# Patient Record
Sex: Female | Born: 1935 | Race: White | Hispanic: No | Marital: Single | Smoking: Never smoker
Health system: Southern US, Community
[De-identification: ages and names within clinical notes are randomized; demographics above are authoritative.]

## PROBLEM LIST (undated history)

## (undated) DIAGNOSIS — L02612 Cutaneous abscess of left foot: Secondary | ICD-10-CM

## (undated) DIAGNOSIS — Z972 Presence of dental prosthetic device (complete) (partial): Secondary | ICD-10-CM

## (undated) DIAGNOSIS — I4891 Unspecified atrial fibrillation: Secondary | ICD-10-CM

## (undated) DIAGNOSIS — I712 Thoracic aortic aneurysm, without rupture, unspecified: Secondary | ICD-10-CM

## (undated) DIAGNOSIS — H269 Unspecified cataract: Secondary | ICD-10-CM

## (undated) DIAGNOSIS — T7840XA Allergy, unspecified, initial encounter: Secondary | ICD-10-CM

## (undated) DIAGNOSIS — R011 Cardiac murmur, unspecified: Secondary | ICD-10-CM

## (undated) DIAGNOSIS — F419 Anxiety disorder, unspecified: Secondary | ICD-10-CM

## (undated) DIAGNOSIS — K219 Gastro-esophageal reflux disease without esophagitis: Secondary | ICD-10-CM

## (undated) DIAGNOSIS — L03032 Cellulitis of left toe: Secondary | ICD-10-CM

## (undated) DIAGNOSIS — M1991 Primary osteoarthritis, unspecified site: Secondary | ICD-10-CM

## (undated) DIAGNOSIS — C801 Malignant (primary) neoplasm, unspecified: Secondary | ICD-10-CM

## (undated) DIAGNOSIS — Z923 Personal history of irradiation: Secondary | ICD-10-CM

## (undated) HISTORY — PX: OTHER SURGICAL HISTORY: SHX169

## (undated) HISTORY — DX: Allergy, unspecified, initial encounter: T78.40XA

## (undated) HISTORY — DX: Anxiety disorder, unspecified: F41.9

## (undated) HISTORY — DX: Cardiac murmur, unspecified: R01.1

## (undated) HISTORY — DX: Gastro-esophageal reflux disease without esophagitis: K21.9

## (undated) HISTORY — PX: JOINT REPLACEMENT: SHX530

## (undated) HISTORY — PX: SKIN BIOPSY: SHX1

## (undated) HISTORY — DX: Primary osteoarthritis, unspecified site: M19.91

## (undated) HISTORY — PX: REPLACEMENT TOTAL KNEE: SUR1224

## (undated) HISTORY — DX: Unspecified cataract: H26.9

---
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
## 1989-06-15 DIAGNOSIS — Z923 Personal history of irradiation: Secondary | ICD-10-CM

## 1989-06-15 DIAGNOSIS — C801 Malignant (primary) neoplasm, unspecified: Secondary | ICD-10-CM

## 1989-06-15 HISTORY — PX: BREAST LUMPECTOMY: SHX2

## 1989-06-15 HISTORY — DX: Personal history of irradiation: Z92.3

## 1989-06-15 HISTORY — DX: Malignant (primary) neoplasm, unspecified: C80.1

## 1993-06-15 HISTORY — PX: OOPHORECTOMY: SHX86

## 2008-06-15 HISTORY — PX: CYSTOCELE REPAIR: SHX163

## 2011-12-18 DIAGNOSIS — M1990 Unspecified osteoarthritis, unspecified site: Secondary | ICD-10-CM | POA: Insufficient documentation

## 2011-12-18 DIAGNOSIS — Z853 Personal history of malignant neoplasm of breast: Secondary | ICD-10-CM | POA: Insufficient documentation

## 2012-12-13 DIAGNOSIS — M19019 Primary osteoarthritis, unspecified shoulder: Secondary | ICD-10-CM | POA: Insufficient documentation

## 2014-06-15 HISTORY — PX: TOTAL SHOULDER ARTHROPLASTY: SHX126

## 2014-07-04 DIAGNOSIS — M810 Age-related osteoporosis without current pathological fracture: Secondary | ICD-10-CM | POA: Insufficient documentation

## 2015-06-16 HISTORY — PX: EYE SURGERY: SHX253

## 2015-06-19 DIAGNOSIS — M4806 Spinal stenosis, lumbar region: Secondary | ICD-10-CM | POA: Diagnosis not present

## 2015-06-28 DIAGNOSIS — M4806 Spinal stenosis, lumbar region: Secondary | ICD-10-CM | POA: Diagnosis not present

## 2015-07-03 DIAGNOSIS — M4806 Spinal stenosis, lumbar region: Secondary | ICD-10-CM | POA: Diagnosis not present

## 2015-07-05 DIAGNOSIS — M4806 Spinal stenosis, lumbar region: Secondary | ICD-10-CM | POA: Diagnosis not present

## 2015-07-09 DIAGNOSIS — H2512 Age-related nuclear cataract, left eye: Secondary | ICD-10-CM | POA: Diagnosis not present

## 2015-07-10 DIAGNOSIS — M4806 Spinal stenosis, lumbar region: Secondary | ICD-10-CM | POA: Diagnosis not present

## 2015-07-23 DIAGNOSIS — M4806 Spinal stenosis, lumbar region: Secondary | ICD-10-CM | POA: Diagnosis not present

## 2015-08-09 DIAGNOSIS — H2512 Age-related nuclear cataract, left eye: Secondary | ICD-10-CM | POA: Insufficient documentation

## 2015-08-12 DIAGNOSIS — F419 Anxiety disorder, unspecified: Secondary | ICD-10-CM | POA: Diagnosis not present

## 2015-08-12 DIAGNOSIS — Z791 Long term (current) use of non-steroidal anti-inflammatories (NSAID): Secondary | ICD-10-CM | POA: Diagnosis not present

## 2015-08-12 DIAGNOSIS — Z882 Allergy status to sulfonamides status: Secondary | ICD-10-CM | POA: Diagnosis not present

## 2015-08-12 DIAGNOSIS — H2512 Age-related nuclear cataract, left eye: Secondary | ICD-10-CM | POA: Diagnosis not present

## 2015-08-12 DIAGNOSIS — Z79899 Other long term (current) drug therapy: Secondary | ICD-10-CM | POA: Diagnosis not present

## 2015-08-12 DIAGNOSIS — M1990 Unspecified osteoarthritis, unspecified site: Secondary | ICD-10-CM | POA: Diagnosis not present

## 2015-08-12 DIAGNOSIS — H269 Unspecified cataract: Secondary | ICD-10-CM | POA: Diagnosis not present

## 2015-08-12 DIAGNOSIS — K219 Gastro-esophageal reflux disease without esophagitis: Secondary | ICD-10-CM | POA: Diagnosis not present

## 2015-08-20 DIAGNOSIS — M4806 Spinal stenosis, lumbar region: Secondary | ICD-10-CM | POA: Diagnosis not present

## 2015-08-22 DIAGNOSIS — B354 Tinea corporis: Secondary | ICD-10-CM | POA: Diagnosis not present

## 2015-08-22 DIAGNOSIS — E663 Overweight: Secondary | ICD-10-CM | POA: Diagnosis not present

## 2015-08-27 DIAGNOSIS — M4806 Spinal stenosis, lumbar region: Secondary | ICD-10-CM | POA: Diagnosis not present

## 2015-09-05 DIAGNOSIS — R0781 Pleurodynia: Secondary | ICD-10-CM | POA: Diagnosis not present

## 2015-09-05 DIAGNOSIS — M940 Chondrocostal junction syndrome [Tietze]: Secondary | ICD-10-CM | POA: Diagnosis not present

## 2015-10-15 DIAGNOSIS — M5136 Other intervertebral disc degeneration, lumbar region: Secondary | ICD-10-CM | POA: Diagnosis not present

## 2015-10-15 DIAGNOSIS — M4806 Spinal stenosis, lumbar region: Secondary | ICD-10-CM | POA: Diagnosis not present

## 2015-10-15 DIAGNOSIS — M47896 Other spondylosis, lumbar region: Secondary | ICD-10-CM | POA: Diagnosis not present

## 2015-10-15 DIAGNOSIS — M4316 Spondylolisthesis, lumbar region: Secondary | ICD-10-CM | POA: Diagnosis not present

## 2015-11-13 DIAGNOSIS — R0789 Other chest pain: Secondary | ICD-10-CM | POA: Diagnosis not present

## 2015-12-31 DIAGNOSIS — I10 Essential (primary) hypertension: Secondary | ICD-10-CM | POA: Diagnosis not present

## 2015-12-31 DIAGNOSIS — M4806 Spinal stenosis, lumbar region: Secondary | ICD-10-CM | POA: Diagnosis not present

## 2015-12-31 DIAGNOSIS — M5136 Other intervertebral disc degeneration, lumbar region: Secondary | ICD-10-CM | POA: Diagnosis not present

## 2015-12-31 DIAGNOSIS — M4316 Spondylolisthesis, lumbar region: Secondary | ICD-10-CM | POA: Diagnosis not present

## 2016-02-25 DIAGNOSIS — E785 Hyperlipidemia, unspecified: Secondary | ICD-10-CM | POA: Diagnosis not present

## 2016-02-25 DIAGNOSIS — Z0000 Encounter for general adult medical examination without abnormal findings: Secondary | ICD-10-CM | POA: Diagnosis not present

## 2016-02-25 DIAGNOSIS — Z136 Encounter for screening for cardiovascular disorders: Secondary | ICD-10-CM | POA: Diagnosis not present

## 2016-02-25 DIAGNOSIS — Z23 Encounter for immunization: Secondary | ICD-10-CM | POA: Diagnosis not present

## 2016-02-25 DIAGNOSIS — R21 Rash and other nonspecific skin eruption: Secondary | ICD-10-CM | POA: Diagnosis not present

## 2016-02-28 DIAGNOSIS — H43391 Other vitreous opacities, right eye: Secondary | ICD-10-CM | POA: Diagnosis not present

## 2016-02-28 DIAGNOSIS — Z961 Presence of intraocular lens: Secondary | ICD-10-CM | POA: Diagnosis not present

## 2016-02-28 DIAGNOSIS — H35371 Puckering of macula, right eye: Secondary | ICD-10-CM | POA: Diagnosis not present

## 2016-04-02 DIAGNOSIS — M47896 Other spondylosis, lumbar region: Secondary | ICD-10-CM | POA: Diagnosis not present

## 2016-04-02 DIAGNOSIS — M4316 Spondylolisthesis, lumbar region: Secondary | ICD-10-CM | POA: Diagnosis not present

## 2016-04-02 DIAGNOSIS — M4120 Other idiopathic scoliosis, site unspecified: Secondary | ICD-10-CM | POA: Diagnosis not present

## 2016-04-02 DIAGNOSIS — M48062 Spinal stenosis, lumbar region with neurogenic claudication: Secondary | ICD-10-CM | POA: Diagnosis not present

## 2016-04-02 DIAGNOSIS — M5136 Other intervertebral disc degeneration, lumbar region: Secondary | ICD-10-CM | POA: Diagnosis not present

## 2016-04-19 DIAGNOSIS — M48061 Spinal stenosis, lumbar region without neurogenic claudication: Secondary | ICD-10-CM | POA: Diagnosis not present

## 2016-04-19 DIAGNOSIS — M4186 Other forms of scoliosis, lumbar region: Secondary | ICD-10-CM | POA: Diagnosis not present

## 2016-04-19 DIAGNOSIS — M47817 Spondylosis without myelopathy or radiculopathy, lumbosacral region: Secondary | ICD-10-CM | POA: Diagnosis not present

## 2016-04-19 DIAGNOSIS — M4316 Spondylolisthesis, lumbar region: Secondary | ICD-10-CM | POA: Diagnosis not present

## 2016-04-28 DIAGNOSIS — M79675 Pain in left toe(s): Secondary | ICD-10-CM | POA: Diagnosis not present

## 2016-04-28 DIAGNOSIS — M79674 Pain in right toe(s): Secondary | ICD-10-CM | POA: Diagnosis not present

## 2016-04-28 DIAGNOSIS — B351 Tinea unguium: Secondary | ICD-10-CM | POA: Diagnosis not present

## 2016-04-30 DIAGNOSIS — M4120 Other idiopathic scoliosis, site unspecified: Secondary | ICD-10-CM | POA: Diagnosis not present

## 2016-04-30 DIAGNOSIS — M48062 Spinal stenosis, lumbar region with neurogenic claudication: Secondary | ICD-10-CM | POA: Diagnosis not present

## 2016-04-30 DIAGNOSIS — M4316 Spondylolisthesis, lumbar region: Secondary | ICD-10-CM | POA: Diagnosis not present

## 2016-08-12 DIAGNOSIS — S99922A Unspecified injury of left foot, initial encounter: Secondary | ICD-10-CM | POA: Diagnosis not present

## 2016-08-12 DIAGNOSIS — J309 Allergic rhinitis, unspecified: Secondary | ICD-10-CM | POA: Diagnosis not present

## 2016-08-12 DIAGNOSIS — M79672 Pain in left foot: Secondary | ICD-10-CM | POA: Diagnosis not present

## 2016-09-10 DIAGNOSIS — J309 Allergic rhinitis, unspecified: Secondary | ICD-10-CM | POA: Diagnosis not present

## 2016-09-10 DIAGNOSIS — R51 Headache: Secondary | ICD-10-CM | POA: Diagnosis not present

## 2016-09-24 DIAGNOSIS — D485 Neoplasm of uncertain behavior of skin: Secondary | ICD-10-CM | POA: Diagnosis not present

## 2016-09-24 DIAGNOSIS — J309 Allergic rhinitis, unspecified: Secondary | ICD-10-CM | POA: Diagnosis not present

## 2016-10-06 DIAGNOSIS — S0501XA Injury of conjunctiva and corneal abrasion without foreign body, right eye, initial encounter: Secondary | ICD-10-CM | POA: Diagnosis not present

## 2016-10-09 DIAGNOSIS — S0501XD Injury of conjunctiva and corneal abrasion without foreign body, right eye, subsequent encounter: Secondary | ICD-10-CM | POA: Diagnosis not present

## 2016-10-12 DIAGNOSIS — L309 Dermatitis, unspecified: Secondary | ICD-10-CM | POA: Diagnosis not present

## 2016-10-27 DIAGNOSIS — R509 Fever, unspecified: Secondary | ICD-10-CM | POA: Diagnosis not present

## 2016-10-27 DIAGNOSIS — R531 Weakness: Secondary | ICD-10-CM | POA: Diagnosis not present

## 2016-10-27 DIAGNOSIS — R0602 Shortness of breath: Secondary | ICD-10-CM | POA: Diagnosis not present

## 2016-10-27 DIAGNOSIS — R05 Cough: Secondary | ICD-10-CM | POA: Diagnosis not present

## 2016-10-27 DIAGNOSIS — J159 Unspecified bacterial pneumonia: Secondary | ICD-10-CM | POA: Diagnosis not present

## 2016-10-29 DIAGNOSIS — J181 Lobar pneumonia, unspecified organism: Secondary | ICD-10-CM | POA: Diagnosis not present

## 2016-12-22 DIAGNOSIS — L821 Other seborrheic keratosis: Secondary | ICD-10-CM | POA: Diagnosis not present

## 2016-12-22 DIAGNOSIS — C44310 Basal cell carcinoma of skin of unspecified parts of face: Secondary | ICD-10-CM | POA: Diagnosis not present

## 2016-12-22 DIAGNOSIS — D485 Neoplasm of uncertain behavior of skin: Secondary | ICD-10-CM | POA: Diagnosis not present

## 2016-12-22 DIAGNOSIS — L814 Other melanin hyperpigmentation: Secondary | ICD-10-CM | POA: Diagnosis not present

## 2016-12-31 DIAGNOSIS — M779 Enthesopathy, unspecified: Secondary | ICD-10-CM | POA: Diagnosis not present

## 2017-01-01 DIAGNOSIS — M1990 Unspecified osteoarthritis, unspecified site: Secondary | ICD-10-CM | POA: Diagnosis not present

## 2017-01-01 DIAGNOSIS — R51 Headache: Secondary | ICD-10-CM | POA: Diagnosis not present

## 2017-01-01 DIAGNOSIS — J309 Allergic rhinitis, unspecified: Secondary | ICD-10-CM | POA: Diagnosis not present

## 2017-01-01 DIAGNOSIS — M545 Low back pain: Secondary | ICD-10-CM | POA: Diagnosis not present

## 2017-01-01 DIAGNOSIS — K219 Gastro-esophageal reflux disease without esophagitis: Secondary | ICD-10-CM | POA: Diagnosis not present

## 2017-01-11 DIAGNOSIS — Z853 Personal history of malignant neoplasm of breast: Secondary | ICD-10-CM | POA: Diagnosis not present

## 2017-01-11 DIAGNOSIS — Z96611 Presence of right artificial shoulder joint: Secondary | ICD-10-CM | POA: Diagnosis not present

## 2017-01-11 DIAGNOSIS — M25571 Pain in right ankle and joints of right foot: Secondary | ICD-10-CM | POA: Diagnosis not present

## 2017-01-11 DIAGNOSIS — Z96653 Presence of artificial knee joint, bilateral: Secondary | ICD-10-CM | POA: Diagnosis not present

## 2017-01-11 DIAGNOSIS — M25572 Pain in left ankle and joints of left foot: Secondary | ICD-10-CM | POA: Diagnosis not present

## 2017-01-15 DIAGNOSIS — Z96653 Presence of artificial knee joint, bilateral: Secondary | ICD-10-CM | POA: Diagnosis not present

## 2017-01-15 DIAGNOSIS — Z853 Personal history of malignant neoplasm of breast: Secondary | ICD-10-CM | POA: Diagnosis not present

## 2017-01-15 DIAGNOSIS — Z96611 Presence of right artificial shoulder joint: Secondary | ICD-10-CM | POA: Diagnosis not present

## 2017-01-15 DIAGNOSIS — M25571 Pain in right ankle and joints of right foot: Secondary | ICD-10-CM | POA: Diagnosis not present

## 2017-01-15 DIAGNOSIS — M25572 Pain in left ankle and joints of left foot: Secondary | ICD-10-CM | POA: Diagnosis not present

## 2017-01-20 DIAGNOSIS — M25571 Pain in right ankle and joints of right foot: Secondary | ICD-10-CM | POA: Diagnosis not present

## 2017-01-20 DIAGNOSIS — Z853 Personal history of malignant neoplasm of breast: Secondary | ICD-10-CM | POA: Diagnosis not present

## 2017-01-20 DIAGNOSIS — Z96653 Presence of artificial knee joint, bilateral: Secondary | ICD-10-CM | POA: Diagnosis not present

## 2017-01-20 DIAGNOSIS — Z96611 Presence of right artificial shoulder joint: Secondary | ICD-10-CM | POA: Diagnosis not present

## 2017-01-20 DIAGNOSIS — M25572 Pain in left ankle and joints of left foot: Secondary | ICD-10-CM | POA: Diagnosis not present

## 2017-01-26 DIAGNOSIS — Z96611 Presence of right artificial shoulder joint: Secondary | ICD-10-CM | POA: Diagnosis not present

## 2017-01-26 DIAGNOSIS — M25571 Pain in right ankle and joints of right foot: Secondary | ICD-10-CM | POA: Diagnosis not present

## 2017-01-26 DIAGNOSIS — Z853 Personal history of malignant neoplasm of breast: Secondary | ICD-10-CM | POA: Diagnosis not present

## 2017-01-26 DIAGNOSIS — Z96653 Presence of artificial knee joint, bilateral: Secondary | ICD-10-CM | POA: Diagnosis not present

## 2017-01-26 DIAGNOSIS — M25572 Pain in left ankle and joints of left foot: Secondary | ICD-10-CM | POA: Diagnosis not present

## 2017-01-29 DIAGNOSIS — Z96611 Presence of right artificial shoulder joint: Secondary | ICD-10-CM | POA: Diagnosis not present

## 2017-01-29 DIAGNOSIS — M25572 Pain in left ankle and joints of left foot: Secondary | ICD-10-CM | POA: Diagnosis not present

## 2017-01-29 DIAGNOSIS — Z853 Personal history of malignant neoplasm of breast: Secondary | ICD-10-CM | POA: Diagnosis not present

## 2017-01-29 DIAGNOSIS — M25571 Pain in right ankle and joints of right foot: Secondary | ICD-10-CM | POA: Diagnosis not present

## 2017-01-29 DIAGNOSIS — Z96653 Presence of artificial knee joint, bilateral: Secondary | ICD-10-CM | POA: Diagnosis not present

## 2017-02-03 DIAGNOSIS — Z96653 Presence of artificial knee joint, bilateral: Secondary | ICD-10-CM | POA: Diagnosis not present

## 2017-02-03 DIAGNOSIS — Z96611 Presence of right artificial shoulder joint: Secondary | ICD-10-CM | POA: Diagnosis not present

## 2017-02-03 DIAGNOSIS — Z853 Personal history of malignant neoplasm of breast: Secondary | ICD-10-CM | POA: Diagnosis not present

## 2017-02-03 DIAGNOSIS — M25571 Pain in right ankle and joints of right foot: Secondary | ICD-10-CM | POA: Diagnosis not present

## 2017-02-03 DIAGNOSIS — M25572 Pain in left ankle and joints of left foot: Secondary | ICD-10-CM | POA: Diagnosis not present

## 2017-02-08 DIAGNOSIS — M25571 Pain in right ankle and joints of right foot: Secondary | ICD-10-CM | POA: Diagnosis not present

## 2017-02-08 DIAGNOSIS — M25572 Pain in left ankle and joints of left foot: Secondary | ICD-10-CM | POA: Diagnosis not present

## 2017-02-08 DIAGNOSIS — Z853 Personal history of malignant neoplasm of breast: Secondary | ICD-10-CM | POA: Diagnosis not present

## 2017-02-08 DIAGNOSIS — Z96653 Presence of artificial knee joint, bilateral: Secondary | ICD-10-CM | POA: Diagnosis not present

## 2017-02-08 DIAGNOSIS — Z96611 Presence of right artificial shoulder joint: Secondary | ICD-10-CM | POA: Diagnosis not present

## 2017-02-16 DIAGNOSIS — R262 Difficulty in walking, not elsewhere classified: Secondary | ICD-10-CM | POA: Diagnosis not present

## 2017-02-16 DIAGNOSIS — M7751 Other enthesopathy of right foot: Secondary | ICD-10-CM | POA: Diagnosis not present

## 2017-02-18 DIAGNOSIS — C44319 Basal cell carcinoma of skin of other parts of face: Secondary | ICD-10-CM | POA: Diagnosis not present

## 2017-02-18 DIAGNOSIS — Z481 Encounter for planned postprocedural wound closure: Secondary | ICD-10-CM | POA: Diagnosis not present

## 2017-02-19 DIAGNOSIS — Z853 Personal history of malignant neoplasm of breast: Secondary | ICD-10-CM | POA: Diagnosis not present

## 2017-02-19 DIAGNOSIS — M25572 Pain in left ankle and joints of left foot: Secondary | ICD-10-CM | POA: Diagnosis not present

## 2017-02-19 DIAGNOSIS — Z96653 Presence of artificial knee joint, bilateral: Secondary | ICD-10-CM | POA: Diagnosis not present

## 2017-02-19 DIAGNOSIS — Z96611 Presence of right artificial shoulder joint: Secondary | ICD-10-CM | POA: Diagnosis not present

## 2017-02-19 DIAGNOSIS — M25571 Pain in right ankle and joints of right foot: Secondary | ICD-10-CM | POA: Diagnosis not present

## 2017-02-23 DIAGNOSIS — Z96653 Presence of artificial knee joint, bilateral: Secondary | ICD-10-CM | POA: Diagnosis not present

## 2017-02-23 DIAGNOSIS — Z853 Personal history of malignant neoplasm of breast: Secondary | ICD-10-CM | POA: Diagnosis not present

## 2017-02-23 DIAGNOSIS — M25571 Pain in right ankle and joints of right foot: Secondary | ICD-10-CM | POA: Diagnosis not present

## 2017-02-23 DIAGNOSIS — M25572 Pain in left ankle and joints of left foot: Secondary | ICD-10-CM | POA: Diagnosis not present

## 2017-02-23 DIAGNOSIS — Z96611 Presence of right artificial shoulder joint: Secondary | ICD-10-CM | POA: Diagnosis not present

## 2017-02-26 DIAGNOSIS — Z96611 Presence of right artificial shoulder joint: Secondary | ICD-10-CM | POA: Diagnosis not present

## 2017-02-26 DIAGNOSIS — Z853 Personal history of malignant neoplasm of breast: Secondary | ICD-10-CM | POA: Diagnosis not present

## 2017-02-26 DIAGNOSIS — Z96653 Presence of artificial knee joint, bilateral: Secondary | ICD-10-CM | POA: Diagnosis not present

## 2017-02-26 DIAGNOSIS — M25571 Pain in right ankle and joints of right foot: Secondary | ICD-10-CM | POA: Diagnosis not present

## 2017-02-26 DIAGNOSIS — M25572 Pain in left ankle and joints of left foot: Secondary | ICD-10-CM | POA: Diagnosis not present

## 2017-03-05 DIAGNOSIS — Z853 Personal history of malignant neoplasm of breast: Secondary | ICD-10-CM | POA: Diagnosis not present

## 2017-03-05 DIAGNOSIS — M25571 Pain in right ankle and joints of right foot: Secondary | ICD-10-CM | POA: Diagnosis not present

## 2017-03-05 DIAGNOSIS — Z96611 Presence of right artificial shoulder joint: Secondary | ICD-10-CM | POA: Diagnosis not present

## 2017-03-05 DIAGNOSIS — Z96653 Presence of artificial knee joint, bilateral: Secondary | ICD-10-CM | POA: Diagnosis not present

## 2017-03-05 DIAGNOSIS — M25572 Pain in left ankle and joints of left foot: Secondary | ICD-10-CM | POA: Diagnosis not present

## 2017-03-08 DIAGNOSIS — Z853 Personal history of malignant neoplasm of breast: Secondary | ICD-10-CM | POA: Diagnosis not present

## 2017-03-08 DIAGNOSIS — M25572 Pain in left ankle and joints of left foot: Secondary | ICD-10-CM | POA: Diagnosis not present

## 2017-03-08 DIAGNOSIS — Z96611 Presence of right artificial shoulder joint: Secondary | ICD-10-CM | POA: Diagnosis not present

## 2017-03-08 DIAGNOSIS — M25571 Pain in right ankle and joints of right foot: Secondary | ICD-10-CM | POA: Diagnosis not present

## 2017-03-08 DIAGNOSIS — Z96653 Presence of artificial knee joint, bilateral: Secondary | ICD-10-CM | POA: Diagnosis not present

## 2017-03-16 DIAGNOSIS — M779 Enthesopathy, unspecified: Secondary | ICD-10-CM | POA: Diagnosis not present

## 2017-03-16 DIAGNOSIS — M79671 Pain in right foot: Secondary | ICD-10-CM | POA: Diagnosis not present

## 2017-03-16 DIAGNOSIS — R262 Difficulty in walking, not elsewhere classified: Secondary | ICD-10-CM | POA: Diagnosis not present

## 2017-04-01 DIAGNOSIS — R05 Cough: Secondary | ICD-10-CM | POA: Diagnosis not present

## 2017-04-01 DIAGNOSIS — H60501 Unspecified acute noninfective otitis externa, right ear: Secondary | ICD-10-CM | POA: Diagnosis not present

## 2017-04-01 DIAGNOSIS — J309 Allergic rhinitis, unspecified: Secondary | ICD-10-CM | POA: Diagnosis not present

## 2017-09-13 DIAGNOSIS — M48062 Spinal stenosis, lumbar region with neurogenic claudication: Secondary | ICD-10-CM | POA: Diagnosis not present

## 2017-09-13 DIAGNOSIS — Z6825 Body mass index (BMI) 25.0-25.9, adult: Secondary | ICD-10-CM | POA: Diagnosis not present

## 2017-09-13 DIAGNOSIS — R030 Elevated blood-pressure reading, without diagnosis of hypertension: Secondary | ICD-10-CM | POA: Diagnosis not present

## 2017-09-23 ENCOUNTER — Other Ambulatory Visit: Payer: Self-pay | Admitting: Internal Medicine

## 2017-09-23 ENCOUNTER — Encounter: Payer: Self-pay | Admitting: Internal Medicine

## 2017-09-23 DIAGNOSIS — M47816 Spondylosis without myelopathy or radiculopathy, lumbar region: Secondary | ICD-10-CM | POA: Diagnosis not present

## 2017-09-23 DIAGNOSIS — M5136 Other intervertebral disc degeneration, lumbar region: Secondary | ICD-10-CM | POA: Diagnosis not present

## 2017-09-23 DIAGNOSIS — M461 Sacroiliitis, not elsewhere classified: Secondary | ICD-10-CM | POA: Diagnosis not present

## 2017-09-23 DIAGNOSIS — Z6825 Body mass index (BMI) 25.0-25.9, adult: Secondary | ICD-10-CM | POA: Diagnosis not present

## 2017-09-23 DIAGNOSIS — R030 Elevated blood-pressure reading, without diagnosis of hypertension: Secondary | ICD-10-CM | POA: Diagnosis not present

## 2017-09-23 DIAGNOSIS — M810 Age-related osteoporosis without current pathological fracture: Secondary | ICD-10-CM

## 2017-09-23 DIAGNOSIS — M48062 Spinal stenosis, lumbar region with neurogenic claudication: Secondary | ICD-10-CM | POA: Diagnosis not present

## 2017-09-24 ENCOUNTER — Ambulatory Visit (INDEPENDENT_AMBULATORY_CARE_PROVIDER_SITE_OTHER): Payer: Medicare Other | Admitting: Internal Medicine

## 2017-09-24 ENCOUNTER — Encounter: Payer: Self-pay | Admitting: Internal Medicine

## 2017-09-24 VITALS — BP 146/92 | HR 80 | Ht 64.0 in | Wt 177.0 lb

## 2017-09-24 DIAGNOSIS — K219 Gastro-esophageal reflux disease without esophagitis: Secondary | ICD-10-CM

## 2017-09-24 DIAGNOSIS — M5136 Other intervertebral disc degeneration, lumbar region: Secondary | ICD-10-CM

## 2017-09-24 DIAGNOSIS — Z85828 Personal history of other malignant neoplasm of skin: Secondary | ICD-10-CM | POA: Diagnosis not present

## 2017-09-24 DIAGNOSIS — Z8619 Personal history of other infectious and parasitic diseases: Secondary | ICD-10-CM

## 2017-09-24 DIAGNOSIS — R030 Elevated blood-pressure reading, without diagnosis of hypertension: Secondary | ICD-10-CM | POA: Insufficient documentation

## 2017-09-24 DIAGNOSIS — M51369 Other intervertebral disc degeneration, lumbar region without mention of lumbar back pain or lower extremity pain: Secondary | ICD-10-CM | POA: Insufficient documentation

## 2017-09-24 NOTE — Patient Instructions (Addendum)
DASH Eating Plan DASH stands for "Dietary Approaches to Stop Hypertension." The DASH eating plan is a healthy eating plan that has been shown to reduce high blood pressure (hypertension). It may also reduce your risk for type 2 diabetes, heart disease, and stroke. The DASH eating plan may also help with weight loss. What are tips for following this plan? General guidelines  Avoid eating more than 2,300 mg (milligrams) of salt (sodium) a day. If you have hypertension, you may need to reduce your sodium intake to 1,500 mg a day.  Limit alcohol intake to no more than 1 drink a day for nonpregnant women and 2 drinks a day for men. One drink equals 12 oz of beer, 5 oz of wine, or 1 oz of hard liquor.  Work with your health care provider to maintain a healthy body weight or to lose weight. Ask what an ideal weight is for you.  Get at least 30 minutes of exercise that causes your heart to beat faster (aerobic exercise) most days of the week. Activities may include walking, swimming, or biking.  Work with your health care provider or diet and nutrition specialist (dietitian) to adjust your eating plan to your individual calorie needs. Reading food labels  Check food labels for the amount of sodium per serving. Choose foods with less than 5 percent of the Daily Value of sodium. Generally, foods with less than 300 mg of sodium per serving fit into this eating plan.  To find whole grains, look for the word "whole" as the first word in the ingredient list. Shopping  Buy products labeled as "low-sodium" or "no salt added."  Buy fresh foods. Avoid canned foods and premade or frozen meals. Cooking  Avoid adding salt when cooking. Use salt-free seasonings or herbs instead of table salt or sea salt. Check with your health care provider or pharmacist before using salt substitutes.  Do not fry foods. Cook foods using healthy methods such as baking, boiling, grilling, and broiling instead.  Cook with  heart-healthy oils, such as olive, canola, soybean, or sunflower oil. Meal planning   Eat a balanced diet that includes: ? 5 or more servings of fruits and vegetables each day. At each meal, try to fill half of your plate with fruits and vegetables. ? Up to 6-8 servings of whole grains each day. ? Less than 6 oz of lean meat, poultry, or fish each day. A 3-oz serving of meat is about the same size as a deck of cards. One egg equals 1 oz. ? 2 servings of low-fat dairy each day. ? A serving of nuts, seeds, or beans 5 times each week. ? Heart-healthy fats. Healthy fats called Omega-3 fatty acids are found in foods such as flaxseeds and coldwater fish, like sardines, salmon, and mackerel.  Limit how much you eat of the following: ? Canned or prepackaged foods. ? Food that is high in trans fat, such as fried foods. ? Food that is high in saturated fat, such as fatty meat. ? Sweets, desserts, sugary drinks, and other foods with added sugar. ? Full-fat dairy products.  Do not salt foods before eating.  Try to eat at least 2 vegetarian meals each week.  Eat more home-cooked food and less restaurant, buffet, and fast food.  When eating at a restaurant, ask that your food be prepared with less salt or no salt, if possible. What foods are recommended? The items listed may not be a complete list. Talk with your dietitian about what   dietary choices are best for you. Grains Whole-grain or whole-wheat bread. Whole-grain or whole-wheat pasta. Brown rice. Oatmeal. Quinoa. Bulgur. Whole-grain and low-sodium cereals. Pita bread. Low-fat, low-sodium crackers. Whole-wheat flour tortillas. Vegetables Fresh or frozen vegetables (raw, steamed, roasted, or grilled). Low-sodium or reduced-sodium tomato and vegetable juice. Low-sodium or reduced-sodium tomato sauce and tomato paste. Low-sodium or reduced-sodium canned vegetables. Fruits All fresh, dried, or frozen fruit. Canned fruit in natural juice (without  added sugar). Meat and other protein foods Skinless chicken or turkey. Ground chicken or turkey. Pork with fat trimmed off. Fish and seafood. Egg whites. Dried beans, peas, or lentils. Unsalted nuts, nut butters, and seeds. Unsalted canned beans. Lean cuts of beef with fat trimmed off. Low-sodium, lean deli meat. Dairy Low-fat (1%) or fat-free (skim) milk. Fat-free, low-fat, or reduced-fat cheeses. Nonfat, low-sodium ricotta or cottage cheese. Low-fat or nonfat yogurt. Low-fat, low-sodium cheese. Fats and oils Soft margarine without trans fats. Vegetable oil. Low-fat, reduced-fat, or light mayonnaise and salad dressings (reduced-sodium). Canola, safflower, olive, soybean, and sunflower oils. Avocado. Seasoning and other foods Herbs. Spices. Seasoning mixes without salt. Unsalted popcorn and pretzels. Fat-free sweets. What foods are not recommended? The items listed may not be a complete list. Talk with your dietitian about what dietary choices are best for you. Grains Baked goods made with fat, such as croissants, muffins, or some breads. Dry pasta or rice meal packs. Vegetables Creamed or fried vegetables. Vegetables in a cheese sauce. Regular canned vegetables (not low-sodium or reduced-sodium). Regular canned tomato sauce and paste (not low-sodium or reduced-sodium). Regular tomato and vegetable juice (not low-sodium or reduced-sodium). Pickles. Olives. Fruits Canned fruit in a light or heavy syrup. Fried fruit. Fruit in cream or butter sauce. Meat and other protein foods Fatty cuts of meat. Ribs. Fried meat. Bacon. Sausage. Bologna and other processed lunch meats. Salami. Fatback. Hotdogs. Bratwurst. Salted nuts and seeds. Canned beans with added salt. Canned or smoked fish. Whole eggs or egg yolks. Chicken or turkey with skin. Dairy Whole or 2% milk, cream, and half-and-half. Whole or full-fat cream cheese. Whole-fat or sweetened yogurt. Full-fat cheese. Nondairy creamers. Whipped toppings.  Processed cheese and cheese spreads. Fats and oils Butter. Stick margarine. Lard. Shortening. Ghee. Bacon fat. Tropical oils, such as coconut, palm kernel, or palm oil. Seasoning and other foods Salted popcorn and pretzels. Onion salt, garlic salt, seasoned salt, table salt, and sea salt. Worcestershire sauce. Tartar sauce. Barbecue sauce. Teriyaki sauce. Soy sauce, including reduced-sodium. Steak sauce. Canned and packaged gravies. Fish sauce. Oyster sauce. Cocktail sauce. Horseradish that you find on the shelf. Ketchup. Mustard. Meat flavorings and tenderizers. Bouillon cubes. Hot sauce and Tabasco sauce. Premade or packaged marinades. Premade or packaged taco seasonings. Relishes. Regular salad dressings. Where to find more information:  National Heart, Lung, and Blood Institute: www.nhlbi.nih.gov  American Heart Association: www.heart.org Summary  The DASH eating plan is a healthy eating plan that has been shown to reduce high blood pressure (hypertension). It may also reduce your risk for type 2 diabetes, heart disease, and stroke.  With the DASH eating plan, you should limit salt (sodium) intake to 2,300 mg a day. If you have hypertension, you may need to reduce your sodium intake to 1,500 mg a day.  When on the DASH eating plan, aim to eat more fresh fruits and vegetables, whole grains, lean proteins, low-fat dairy, and heart-healthy fats.  Work with your health care provider or diet and nutrition specialist (dietitian) to adjust your eating plan to your individual   calorie needs. This information is not intended to replace advice given to you by your health care provider. Make sure you discuss any questions you have with your health care provider. Document Released: 05/21/2011 Document Revised: 05/25/2016 Document Reviewed: 05/25/2016 Elsevier Interactive Patient Education  2018 Burns BP twice a week - record and bring it with you along with your cuff to the next  appointment.  Goal BP 135/85 or less

## 2017-09-24 NOTE — Progress Notes (Signed)
Date:  09/24/2017   Name:  Connie Mitchell   DOB:  [birth year]   MRN:  [id number]   Chief Complaint: Establish Care (new to area) and Hypertension (having elevated B/P at specialist)  Gastroesophageal Reflux  She complains of heartburn. She reports no abdominal pain, no chest pain, no choking, no coughing, no sore throat or no wheezing. This is a recurrent problem. The problem occurs occasionally. Pertinent negatives include no fatigue. Risk factors include hiatal hernia (small HH and mild esophageal stricture). Past procedures include an EGD.  Hypertension  This is a new problem. The problem has been gradually worsening since onset. Pertinent negatives include no chest pain, headaches or palpitations. There are no associated agents to hypertension. Past treatments include nothing. There is no history of kidney disease, CAD/MI or PVD.  Chronic back pain - seeing [facility] in [place].  She has been getting ESIs for years.   Review of Systems  Constitutional: Positive for unexpected weight change. Negative for chills, fatigue and fever.  HENT: Negative for sore throat and trouble swallowing.   Respiratory: Negative for cough, choking and wheezing.   Cardiovascular: Negative for chest pain, palpitations and leg swelling.  Gastrointestinal: Positive for heartburn. Negative for abdominal pain.       Occasional reflux, takes papain chews after each meal  Genitourinary: Negative for difficulty urinating.  Musculoskeletal: Positive for arthralgias, back pain and gait problem.  Neurological: Negative for dizziness and headaches.  Psychiatric/Behavioral: The patient is not nervous/anxious.     Patient Active Problem List   Diagnosis Date Noted  . Lumbar degenerative disc disease 09/24/2017  . Hx of Lyme disease 09/24/2017  . Hx of basal cell carcinoma 09/24/2017  . Nuclear sclerotic cataract of left eye 08/09/2015  . Osteoporosis 07/04/2014  . Primary  localized osteoarthrosis of shoulder region 12/13/2012  . Breast CA ([place]) 12/18/2011  . Osteoarthritis 12/18/2011    Prior to Admission medications   Medication Sig Start Date End Date Taking? Authorizing Provider  Ibuprofen (ADVIL) 200 MG CAPS    Yes [provider]  Papaya Enzyme CHEW  06/15/16  Yes [provider]    Allergies  Allergen Reactions  . Ciprofloxacin Nausea And Vomiting    Past Surgical History:  Procedure Laterality Date  . cataracts Bilateral   . CYSTOCELE REPAIR  2010  . MASTECTOMY PARTIAL / LUMPECTOMY Left 1991   cancer  . OOPHORECTOMY  1995   ovarian mass - benign  . REPLACEMENT TOTAL KNEE Bilateral   . TONSILLECTOMY  1958  . TOTAL SHOULDER ARTHROPLASTY Right     Social History   Tobacco Use  . Smoking status: Never Smoker  . Smokeless tobacco: Never Used  Substance Use Topics  . Alcohol use: Not on file  . Drug use: Not on file     Medication list has been reviewed and updated.  PHQ 2/9 Scores 09/24/2017 09/24/2017  PHQ - 2 Score 0 0  PHQ- 9 Score 4 -    Physical Exam  Constitutional: She is oriented to person, place, and time. No distress.  HENT:  Head: Normocephalic and atraumatic.  Right Ear: Hearing normal.  Left Ear: Hearing normal.  Nose: Nose normal.  Eyes: Conjunctivae and lids are normal. Right eye exhibits no discharge. Left eye exhibits no discharge. No scleral icterus.  Neck: Normal range of motion. Neck supple. Thyromegaly present.  Cardiovascular: Normal rate, regular rhythm and normal heart sounds.  Pulmonary/Chest: Effort normal. No respiratory distress. She  has no wheezes. She exhibits no tenderness.  Musculoskeletal: Normal range of motion.  Neurological: She is alert and oriented to person, place, and time.  Skin: Skin is warm, dry and intact. No lesion and no rash noted.  Psychiatric: Judgment normal.    BP (!) 146/92 (BP Location: Left Arm)   Pulse 80   Ht 5\' 4"  (1.626 m)   Wt 177 lb (80.3 kg)    BMI 30.38 kg/m   Assessment and Plan: 1. Elevated blood pressure reading Discussed diet, weight loss, sodium restriction Check BP 2/wk, record and bring to follow up visit  2. Lumbar degenerative disc disease Followed by pain management  3. Gastroesophageal reflux disease, esophagitis presence not specified Minimally sx  4. Hx of basal cell carcinoma Needs to establish with local dermatologist  5. Hx of Lyme disease   No orders of the defined types were placed in this encounter.   Partially dictated using Editor, commissioning. Any errors are unintentional.  Halina Maidens, MD [facility]  09/24/2017

## 2017-10-06 DIAGNOSIS — M5416 Radiculopathy, lumbar region: Secondary | ICD-10-CM | POA: Diagnosis not present

## 2017-10-06 DIAGNOSIS — M48062 Spinal stenosis, lumbar region with neurogenic claudication: Secondary | ICD-10-CM | POA: Diagnosis not present

## 2017-10-06 DIAGNOSIS — R262 Difficulty in walking, not elsewhere classified: Secondary | ICD-10-CM | POA: Diagnosis not present

## 2017-10-13 DIAGNOSIS — M48062 Spinal stenosis, lumbar region with neurogenic claudication: Secondary | ICD-10-CM | POA: Diagnosis not present

## 2017-10-13 DIAGNOSIS — R262 Difficulty in walking, not elsewhere classified: Secondary | ICD-10-CM | POA: Diagnosis not present

## 2017-10-13 DIAGNOSIS — M5416 Radiculopathy, lumbar region: Secondary | ICD-10-CM | POA: Diagnosis not present

## 2017-10-15 DIAGNOSIS — R262 Difficulty in walking, not elsewhere classified: Secondary | ICD-10-CM | POA: Diagnosis not present

## 2017-10-15 DIAGNOSIS — M5416 Radiculopathy, lumbar region: Secondary | ICD-10-CM | POA: Diagnosis not present

## 2017-10-15 DIAGNOSIS — M48062 Spinal stenosis, lumbar region with neurogenic claudication: Secondary | ICD-10-CM | POA: Diagnosis not present

## 2017-10-18 DIAGNOSIS — M48062 Spinal stenosis, lumbar region with neurogenic claudication: Secondary | ICD-10-CM | POA: Diagnosis not present

## 2017-10-18 DIAGNOSIS — R262 Difficulty in walking, not elsewhere classified: Secondary | ICD-10-CM | POA: Diagnosis not present

## 2017-10-18 DIAGNOSIS — M5416 Radiculopathy, lumbar region: Secondary | ICD-10-CM | POA: Diagnosis not present

## 2017-10-20 ENCOUNTER — Other Ambulatory Visit: Payer: Self-pay

## 2017-10-20 ENCOUNTER — Encounter: Payer: Self-pay | Admitting: Emergency Medicine

## 2017-10-20 ENCOUNTER — Ambulatory Visit
Admission: EM | Admit: 2017-10-20 | Discharge: 2017-10-20 | Disposition: A | Discharge status: Home / Self Care | Payer: Medicare Other | Attending: Family Medicine | Admitting: Family Medicine

## 2017-10-20 DIAGNOSIS — M5416 Radiculopathy, lumbar region: Secondary | ICD-10-CM | POA: Diagnosis not present

## 2017-10-20 DIAGNOSIS — M48062 Spinal stenosis, lumbar region with neurogenic claudication: Secondary | ICD-10-CM | POA: Diagnosis not present

## 2017-10-20 DIAGNOSIS — L03115 Cellulitis of right lower limb: Secondary | ICD-10-CM | POA: Diagnosis not present

## 2017-10-20 DIAGNOSIS — R262 Difficulty in walking, not elsewhere classified: Secondary | ICD-10-CM | POA: Diagnosis not present

## 2017-10-20 MED ORDER — DOXYCYCLINE HYCLATE 100 MG PO CAPS: 100.0000 mg | ORAL_CAPSULE | Freq: Two times a day (BID) | ORAL | 0 refills | Status: DC

## 2017-10-20 NOTE — ED Provider Notes (Signed)
[facility]  CSN: [id number] Arrival date & time: 10/20/17  1505  History   Chief Complaint Chief Complaint  Patient presents with  . Insect Bite   HPI  84 year old female presents with a reported insect bite.  Patient states that she was bitten by some sort of insect 8 days ago.  It is located on her right calf.  She states that the area has continued to be red.  Tender to palpation.  Slightly warm.  No fever.  She is been applying triple antibiotic ointment without resolution.  No known exacerbating factors.  No other associated symptoms.  No other complaints.  PMH: Patient Active Problem List   Diagnosis Date Noted  . Lumbar degenerative disc disease 09/24/2017  . Hx of Lyme disease 09/24/2017  . Hx of basal cell carcinoma 09/24/2017  . Gastroesophageal reflux disease 09/24/2017  . Elevated blood pressure reading 09/24/2017  . Nuclear sclerotic cataract of left eye 08/09/2015  . Osteoporosis 07/04/2014  . Primary localized osteoarthrosis of shoulder region 12/13/2012  . History of breast cancer 12/18/2011  . Osteoarthritis 12/18/2011    Past Surgical History:  Procedure Laterality Date  . cataracts Bilateral   . CYSTOCELE REPAIR  2010  . MASTECTOMY PARTIAL / LUMPECTOMY Left 1991   cancer  . OOPHORECTOMY  1995   ovarian mass - benign  . REPLACEMENT TOTAL KNEE Bilateral   . TONSILLECTOMY  1958  . TOTAL SHOULDER ARTHROPLASTY Right    OB History   None    Home Medications    Prior to Admission medications   Medication Sig Start Date End Date Taking? Authorizing Provider  doxycycline (VIBRAMYCIN) 100 MG capsule Take 1 capsule (100 mg total) by mouth 2 (two) times daily. 10/20/17   Coral Spikes, DO  Ibuprofen (ADVIL) 200 MG CAPS     [provider]  Papaya Enzyme CHEW  06/15/16   [provider]   Family History Family History  Problem Relation Age of Onset  . Hypertension Mother   . Alzheimer's disease Mother   . Stroke Father   .  Cancer Brother   . Atrial fibrillation Brother   . Cancer Other    Social History Social History   Tobacco Use  . Smoking status: Never Smoker  . Smokeless tobacco: Never Used  Substance Use Topics  . Alcohol use: Yes  . Drug use: Not on file   Allergies   Ciprofloxacin  Review of Systems Review of Systems  Constitutional: Negative.   Skin:       Redness.   Physical Exam Triage Vital Signs ED Triage Vitals  Enc Vitals Group     BP 10/20/17 1522 125/90     Pulse Rate 10/20/17 1522 77     Resp 10/20/17 1522 18     Temp 10/20/17 1522 99.1 F (37.3 C)     Temp Source 10/20/17 1522 Oral     SpO2 10/20/17 1522 99 %     Weight 10/20/17 1525 172 lb (78 kg)     Height 10/20/17 1525 5\' 3"  (1.6 m)     Head Circumference --      Peak Flow --      Pain Score 10/20/17 1525 0     Pain Loc --      Pain Edu? --      Excl. in [place]? --    Updated Vital Signs BP 125/90 (BP Location: Left Arm)   Pulse 77   Temp 99.1 F (37.3 C) (  Oral)   Resp 18   Ht 5\' 3"  (1.6 m)   Wt 172 lb (78 kg)   SpO2 99%   BMI 30.47 kg/m   Physical Exam  Constitutional: She is oriented to person, place, and time. She appears well-developed. No distress.  Cardiovascular: Normal rate and regular rhythm.  Pulmonary/Chest: Effort normal and breath sounds normal. She has no wheezes. She has no rales.  Neurological: She is alert and oriented to person, place, and time.  Skin:  Right mid calf with a small area of erythema.  Slightly tender palpation.  No fluctuance.  No drainage.  Psychiatric: She has a normal mood and affect. Her behavior is normal.  Nursing note and vitals reviewed.  UC Treatments / Results  Labs (all labs ordered are listed, but only abnormal results are displayed) Labs Reviewed - No data to display  EKG None  Radiology No results found.  Procedures Procedures (including critical care time)  Medications Ordered in UC Medications - No data to display  Initial Impression /  Assessment and Plan / UC Course  I have reviewed the triage vital signs and the nursing notes.  Pertinent labs & imaging results that were available during my care of the patient were reviewed by me and considered in my medical decision making (see chart for details).    84 year old female presents with cellulitis.  Treating with doxycycline.  Final Clinical Impressions(s) / UC Diagnoses   Final diagnoses:  Cellulitis of right lower extremity   Discharge Instructions   None    ED Prescriptions    Medication Sig Dispense Auth. Provider   doxycycline (VIBRAMYCIN) 100 MG capsule Take 1 capsule (100 mg total) by mouth 2 (two) times daily. 14 capsule Coral Spikes, DO     Controlled Substance Prescriptions [place] Controlled Substance Registry consulted? Not Applicable   Coral Spikes, DO 10/20/17 1623

## 2017-10-20 NOTE — ED Triage Notes (Signed)
Patient states she was bit by something about 8 days ago on her right calf

## 2017-10-26 DIAGNOSIS — M48062 Spinal stenosis, lumbar region with neurogenic claudication: Secondary | ICD-10-CM | POA: Diagnosis not present

## 2017-10-26 DIAGNOSIS — M5416 Radiculopathy, lumbar region: Secondary | ICD-10-CM | POA: Diagnosis not present

## 2017-10-26 DIAGNOSIS — R262 Difficulty in walking, not elsewhere classified: Secondary | ICD-10-CM | POA: Diagnosis not present

## 2017-10-28 DIAGNOSIS — M48062 Spinal stenosis, lumbar region with neurogenic claudication: Secondary | ICD-10-CM | POA: Diagnosis not present

## 2017-10-28 DIAGNOSIS — R262 Difficulty in walking, not elsewhere classified: Secondary | ICD-10-CM | POA: Diagnosis not present

## 2017-10-28 DIAGNOSIS — M5416 Radiculopathy, lumbar region: Secondary | ICD-10-CM | POA: Diagnosis not present

## 2017-11-02 DIAGNOSIS — M48062 Spinal stenosis, lumbar region with neurogenic claudication: Secondary | ICD-10-CM | POA: Diagnosis not present

## 2017-11-02 DIAGNOSIS — R262 Difficulty in walking, not elsewhere classified: Secondary | ICD-10-CM | POA: Diagnosis not present

## 2017-11-02 DIAGNOSIS — M5416 Radiculopathy, lumbar region: Secondary | ICD-10-CM | POA: Diagnosis not present

## 2017-11-04 DIAGNOSIS — M5416 Radiculopathy, lumbar region: Secondary | ICD-10-CM | POA: Diagnosis not present

## 2017-11-04 DIAGNOSIS — R262 Difficulty in walking, not elsewhere classified: Secondary | ICD-10-CM | POA: Diagnosis not present

## 2017-11-04 DIAGNOSIS — M48062 Spinal stenosis, lumbar region with neurogenic claudication: Secondary | ICD-10-CM | POA: Diagnosis not present

## 2017-11-09 DIAGNOSIS — M5416 Radiculopathy, lumbar region: Secondary | ICD-10-CM | POA: Diagnosis not present

## 2017-11-09 DIAGNOSIS — R262 Difficulty in walking, not elsewhere classified: Secondary | ICD-10-CM | POA: Diagnosis not present

## 2017-11-09 DIAGNOSIS — M48062 Spinal stenosis, lumbar region with neurogenic claudication: Secondary | ICD-10-CM | POA: Diagnosis not present

## 2017-11-12 ENCOUNTER — Telehealth: Payer: Self-pay | Admitting: Internal Medicine

## 2017-11-12 ENCOUNTER — Telehealth: Payer: Self-pay

## 2017-11-12 NOTE — Telephone Encounter (Signed)
Patient Connie Mitchell that she has had episodes of heart pounding while lying down and BP has been elevated 140/90 also having sharpe pain in L  shoulder x 1 week. Discussed with PCP and she recommends that if this episode happens again to go to ER while it is happening since patient declines SOB, Chest Pain, Dizziness, or any swelling in ankles. Can call next week for OV if shoulder pain persists. Patient agrees.

## 2017-11-12 NOTE — Telephone Encounter (Signed)
Spoke with pt to schedule AWV she wanted to reach nurse as she had pain left shoulder a few days ago and elevated HR. She will call back in directly to [facility] knb

## 2017-11-17 ENCOUNTER — Ambulatory Visit (INDEPENDENT_AMBULATORY_CARE_PROVIDER_SITE_OTHER): Payer: Medicare Other

## 2017-11-17 VITALS — BP 124/60 | HR 74 | Temp 98.6°F | Resp 12 | Ht 64.0 in | Wt 176.6 lb

## 2017-11-17 DIAGNOSIS — E2839 Other primary ovarian failure: Secondary | ICD-10-CM

## 2017-11-17 DIAGNOSIS — Z0000 Encounter for general adult medical examination without abnormal findings: Secondary | ICD-10-CM

## 2017-11-17 NOTE — Progress Notes (Signed)
Subjective:   Connie Mitchell is a 84 y.o. female who presents for Medicare Annual (Subsequent) preventive examination.  Review of Systems:  N/A Cardiac Risk Factors include: advanced age (>22men, >71 women);obesity (BMI >30kg/m2)     Objective:     Vitals: BP 124/60 (BP Location: Right Arm, Patient Position: Sitting, Cuff Size: Normal)   Pulse 74   Temp 98.6 F (37 C) (Oral)   Resp 12   Ht 5\' 4"  (1.626 m)   Wt 176 lb 9.6 oz (80.1 kg)   SpO2 94%   BMI 30.31 kg/m   Body mass index is 30.31 kg/m.  Advanced Directives 11/17/2017  Does Patient Have a Medical Advance Directive? No  Would patient like information on creating a medical advance directive? Yes (MAU/Ambulatory/Procedural Areas - Information given)    Tobacco Social History   Tobacco Use  Smoking Status Never Smoker  Smokeless Tobacco Never Used  Tobacco Comment   smoking cessation materials not required     Counseling given: No Comment: smoking cessation materials not required  Clinical Intake:  Pre-visit preparation completed: Yes  Pain : No/denies pain   BMI - recorded: 30.31 Nutritional Status: BMI > 30  Obese Nutritional Risks: None Diabetes: No  How often do you need to have someone help you when you read instructions, pamphlets, or other written materials from your doctor or pharmacy?: 1 - Never  Interpreter Needed?: No  Information entered by :: AEversole, LPN  Past Medical History:  Diagnosis Date  . GERD (gastroesophageal reflux disease)   . Primary osteoarthritis    Past Surgical History:  Procedure Laterality Date  . cataracts Bilateral   . CYSTOCELE REPAIR  2010  . MASTECTOMY PARTIAL / LUMPECTOMY Left 1991   cancer  . OOPHORECTOMY  1995   ovarian mass - benign  . REPLACEMENT TOTAL KNEE Bilateral   . TONSILLECTOMY  1958  . TOTAL SHOULDER ARTHROPLASTY Right    Family History  Problem Relation Age of Onset  . Hypertension Mother   . Alzheimer's disease Mother   . Stroke  Father   . Cancer Brother        prostate  . Atrial fibrillation Brother   . Cancer Other        breast  . Atrial fibrillation Brother    Social History   Socioeconomic History  . Marital status: Single    Spouse name: Not on file  . Number of children: 0  . Years of education: Not on file  . Highest education level: Master's degree (e.g., MA, MS, MEng, MEd, MSW, MBA)  Occupational History  . Occupation: Retired  Scientific laboratory technician  . Financial resource strain: Not hard at all  . Food insecurity:    Worry: Never true    Inability: Never true  . Transportation needs:    Medical: No    Non-medical: No  Tobacco Use  . Smoking status: Never Smoker  . Smokeless tobacco: Never Used  . Tobacco comment: smoking cessation materials not required  Substance and Sexual Activity  . Alcohol use: Yes    Comment: occassional  . Drug use: Never  . Sexual activity: Not Currently  Lifestyle  . Physical activity:    Days per week: 4 days    Minutes per session: Not on file  . Stress: Not at all  Relationships  . Social connections:    Talks on phone: Patient refused    Gets together: Patient refused    Attends religious service: Patient refused  Active member of club or organization: Patient refused    Attends meetings of clubs or organizations: Patient refused    Relationship status: Patient refused  Other Topics Concern  . Not on file  Social History Narrative  . Not on file    Outpatient Encounter Medications as of 11/17/2017  Medication Sig  . Ibuprofen (ADVIL) 200 MG CAPS   . loratadine (CLARITIN REDITABS) 10 MG dissolvable tablet Take 10 mg by mouth daily.  . Papaya Enzyme CHEW   . doxycycline (VIBRAMYCIN) 100 MG capsule Take 1 capsule (100 mg total) by mouth 2 (two) times daily.   No facility-administered encounter medications on file as of 11/17/2017.     Activities of Daily Living In your present state of health, do you have any difficulty performing the following  activities: 11/17/2017 09/24/2017  Hearing? N N  Comment denies hearing aids -  Vision? N N  Comment wears eyeglasses -  Difficulty concentrating or making decisions? Y N  Comment short term memory loss -  Walking or climbing stairs? Y N  Comment joint and back pain -  Dressing or bathing? N N  Doing errands, shopping? N N  Preparing Food and eating ? N -  Comment denies dentures -  Using the Toilet? N -  In the past six months, have you accidently leaked urine? N -  Do you have problems with loss of bowel control? N -  Managing your Medications? N -  Managing your Finances? N -  Housekeeping or managing your Housekeeping? N -    Patient Care Team: Glean Hess, MD as PCP - General (Internal Medicine) Marlaine Hind, MD as Consulting Physician (Physical Medicine and Rehabilitation) Eustace Moore, MD as Consulting Physician (Neurosurgery)    Assessment:   This is a routine wellness examination for Connie Mitchell.  Exercise Activities and Dietary recommendations Current Exercise Habits: Structured exercise class, Type of exercise: strength training/weights, Time (Minutes): > 60(120 minutes), Frequency (Times/Week): 4, Weekly Exercise (Minutes/Week): 0, Intensity: Mild, Exercise limited by: None identified  Goals    . DIET - INCREASE WATER INTAKE     Recommend to drink at least 6-8 8oz glasses of water per day.       Fall Risk Fall Risk  11/17/2017 09/24/2017 09/24/2017  Falls in the past year? Yes Yes No  Number falls in past yr: 1 1 -  Injury with Fall? No No -  Risk for fall due to : Impaired vision;Other (Comment) - -  Risk for fall due to: Comment wears eyeglasses, ambulates with cane - -  Follow up Falls evaluation completed;Education provided;Falls prevention discussed Falls evaluation completed -   FALL RISK PREVENTION PERTAINING TO HOME: Is your home free of loose throw rugs in walkways, pet beds, electrical cords, etc? Yes Is there adequate lighting in your home to reduce  risk of falls?  Yes Are there stairs in or around your home WITH handrails? Yes  ASSISTIVE DEVICES UTILIZED TO PREVENT FALLS: Use of a cane, walker or w/c? Yes, use of cane Grab bars in the bathroom? No  Shower chair or a place to sit while bathing? Yes An elevated toilet seat or a handicapped toilet? No  Timed Get Up and Go Performed: Yes. Pt ambulated 10 feet within 24 sec. Gait slow, steady and with the use of an assistive device. No intervention required at this time. Fall risk prevention has been discussed.  Community Resource Referral:  Pt declined my offer to send [employer] Referral  to Care Guide for installation of grab bars in the shower or an elevated toilet seat.  Depression Screen PHQ 2/9 Scores 11/17/2017 09/24/2017 09/24/2017  PHQ - 2 Score 0 0 0  PHQ- 9 Score 0 4 -     Cognitive Function     6CIT Screen 11/17/2017  What Year? 0 points  What month? 0 points  What time? 0 points  Count back from 20 0 points  Months in reverse 0 points  Repeat phrase 0 points  Total Score 0    Immunization History  Administered Date(s) Administered  . Influenza Split 06/28/2012  . Pneumococcal Conjugate-13 07/04/2014  . Pneumococcal Polysaccharide-23 06/16/2015  . Tdap 03/11/2012  . Zoster 02/25/2016    Qualifies for Shingles Vaccine? Yes. Zostavax completed 02/25/16. Due for Shingrix. Education has been provided regarding the importance of this vaccine. Pt has been advised to call her insurance company to determine her out of pocket expense. Advised she may also receive this vaccine at her local pharmacy or Health Dept. Verbalized acceptance and understanding.  Overdue for Flu vaccine. Education has been provided regarding the importance of this vaccine and advised to receive when available. Verbalized acceptance and understanding.  Screening Tests Health Maintenance  Topic Date Due  . DEXA SCAN  12/06/2000  . INFLUENZA VACCINE  01/13/2018  . TETANUS/TDAP  03/11/2022   . PNA vac Low Risk Adult  Completed    Cancer Screenings: Lung: Low Dose CT Chest recommended if Age 91-80 years, 30 pack-year currently smoking OR have quit w/in 15years. Patient does not qualify. Breast Screening: No longer required  Bone Density/Dexa: Ordered today. Message sent to referral coordinator for scheduling purposes. Pt is aware that she will receive a call from our office re: her appt Colorectal: No longer required  Additional Screenings: Hepatitis C Screening: Does not qualify    Plan:  I have personally reviewed and addressed the Medicare Annual Wellness questionnaire and have noted the following in the patient's chart:  A. Medical and social history B. Use of alcohol, tobacco or illicit drugs  C. Current medications and supplements D. Functional ability and status E.  Nutritional status F.  Physical activity G. Advance directives H. List of other physicians I.  Hospitalizations, surgeries, and ER visits in previous 12 months J.  [place] such as hearing and vision if needed, cognitive and depression L. Referrals and appointments  In addition, I have reviewed and discussed with patient certain preventive protocols, quality metrics, and best practice recommendations. A written personalized care plan for preventive services as well as general preventive health recommendations were provided to patient.  Signed,  Aleatha Borer, LPN Nurse Health Advisor  MD Recommendations: Zostavax completed 02/25/16. Due for Shingrix. Education has been provided regarding the importance of this vaccine. Pt has been advised to call her insurance company to determine her out of pocket expense. Advised she may also receive this vaccine at her local pharmacy or Health Dept. Verbalized acceptance and understanding.  Overdue for Flu vaccine. Education has been provided regarding the importance of this vaccine and advised to receive when available. Verbalized acceptance and  understanding.  Bone Density/Dexa: Ordered today. Message sent to referral coordinator for scheduling purposes. Pt is aware that she will receive a call from our office re: her appt

## 2017-11-17 NOTE — Patient Instructions (Signed)
Connie Mitchell , Thank you for taking time to come for your Medicare Wellness Visit. I appreciate your ongoing commitment to your health goals. Please review the following plan we discussed and let me know if I can assist you in the future.   Screening recommendations/referrals: Colorectal Screening: No longer required Mammogram: No longer required Bone Density: You will receive a call from our office regarding your appointment  Vision and Dental Exams: Recommended annual ophthalmology exams for early detection of glaucoma and other disorders of the eye Recommended annual dental exams for proper oral hygiene  Vaccinations: Influenza vaccine: Overdue Pneumococcal vaccine: Up to date Tdap vaccine: Up to date Shingles vaccine: Please call your insurance company to determine your out of pocket expense for the Shingrix vaccine. You may also receive this vaccine at your local pharmacy or Health Dept.  Advanced directives: Advance directive discussed with you today. I have provided a copy for you to complete at home and have notarized. Once this is complete please bring a copy in to our office so we can scan it into your chart.  Conditions/risks identified: Recommend to drink at least 6-8 8oz glasses of water per day.  Next appointment: Please schedule your Annual Wellness Visit with your Nurse Health Advisor in one year.  Preventive Care 81 Years and Older, Female Preventive care refers to lifestyle choices and visits with your health care provider that can promote health and wellness. What does preventive care include?  A yearly physical exam. This is also called an annual well check.  Dental exams once or twice a year.  Routine eye exams. Ask your health care provider how often you should have your eyes checked.  Personal lifestyle choices, including:  Daily care of your teeth and gums.  Regular physical activity.  Eating a healthy diet.  Avoiding tobacco and drug use.  Limiting  alcohol use.  Practicing safe sex.  Taking low-dose aspirin every day.  Taking vitamin and mineral supplements as recommended by your health care provider. What happens during an annual well check? The services and screenings done by your health care provider during your annual well check will depend on your age, overall health, lifestyle risk factors, and family history of disease. Counseling  Your health care provider may ask you questions about your:  Alcohol use.  Tobacco use.  Drug use.  Emotional well-being.  Home and relationship well-being.  Sexual activity.  Eating habits.  History of falls.  Memory and ability to understand (cognition).  Work and work Statistician.  Reproductive health. Screening  You may have the following tests or measurements:  Height, weight, and BMI.  Blood pressure.  Lipid and cholesterol levels. These may be checked every 5 years, or more frequently if you are over 14 years old.  Skin check.  Lung cancer screening. You may have this screening every year starting at age 63 if you have a 30-pack-year history of smoking and currently smoke or have quit within the past 15 years.  Fecal occult blood test (FOBT) of the stool. You may have this test every year starting at age 37.  Flexible sigmoidoscopy or colonoscopy. You may have a sigmoidoscopy every 5 years or a colonoscopy every 10 years starting at age 74.  Hepatitis C blood test.  Hepatitis B blood test.  Sexually transmitted disease (STD) testing.  Diabetes screening. This is done by checking your blood sugar (glucose) after you have not eaten for a while (fasting). You may have this done every 1-3 years.  Bone density scan. This is done to screen for osteoporosis. You may have this done starting at age 67.  Mammogram. This may be done every 1-2 years. Talk to your health care provider about how often you should have regular mammograms. Talk with your health care provider  about your test results, treatment options, and if necessary, the need for more tests. Vaccines  Your health care provider may recommend certain vaccines, such as:  Influenza vaccine. This is recommended every year.  Tetanus, diphtheria, and acellular pertussis (Tdap, Td) vaccine. You may need a Td booster every 10 years.  Zoster vaccine. You may need this after age 82.  Pneumococcal 13-valent conjugate (PCV13) vaccine. One dose is recommended after age 34.  Pneumococcal polysaccharide (PPSV23) vaccine. One dose is recommended after age 74. Talk to your health care provider about which screenings and vaccines you need and how often you need them. This information is not intended to replace advice given to you by your health care provider. Make sure you discuss any questions you have with your health care provider. Document Released: 06/28/2015 Document Revised: 02/19/2016 Document Reviewed: 04/02/2015 Elsevier Interactive Patient Education  2017 [place] Prevention in the Home Falls can cause injuries. They can happen to people of all ages. There are many things you can do to make your home safe and to help prevent falls. What can I do on the outside of my home?  Regularly fix the edges of walkways and driveways and fix any cracks.  Remove anything that might make you trip as you walk through a door, such as a raised step or threshold.  Trim any bushes or trees on the path to your home.  Use bright outdoor lighting.  Clear any walking paths of anything that might make someone trip, such as rocks or tools.  Regularly check to see if handrails are loose or broken. Make sure that both sides of any steps have handrails.  Any raised decks and porches should have guardrails on the edges.  Have any leaves, snow, or ice cleared regularly.  Use sand or salt on walking paths during winter.  Clean up any spills in your garage right away. This includes oil or grease  spills. What can I do in the bathroom?  Use night lights.  Install grab bars by the toilet and in the tub and shower. Do not use towel bars as grab bars.  Use non-skid mats or decals in the tub or shower.  If you need to sit down in the shower, use a plastic, non-slip stool.  Keep the floor dry. Clean up any water that spills on the floor as soon as it happens.  Remove soap buildup in the tub or shower regularly.  Attach bath mats securely with double-sided non-slip rug tape.  Do not have throw rugs and other things on the floor that can make you trip. What can I do in the bedroom?  Use night lights.  Make sure that you have a light by your bed that is easy to reach.  Do not use any sheets or blankets that are too big for your bed. They should not hang down onto the floor.  Have a firm chair that has side arms. You can use this for support while you get dressed.  Do not have throw rugs and other things on the floor that can make you trip. What can I do in the kitchen?  Clean up any spills right away.  Avoid walking  on wet floors.  Keep items that you use a lot in easy-to-reach places.  If you need to reach something above you, use a strong step stool that has a grab bar.  Keep electrical cords out of the way.  Do not use floor polish or wax that makes floors slippery. If you must use wax, use non-skid floor wax.  Do not have throw rugs and other things on the floor that can make you trip. What can I do with my stairs?  Do not leave any items on the stairs.  Make sure that there are handrails on both sides of the stairs and use them. Fix handrails that are broken or loose. Make sure that handrails are as long as the stairways.  Check any carpeting to make sure that it is firmly attached to the stairs. Fix any carpet that is loose or worn.  Avoid having throw rugs at the top or bottom of the stairs. If you do have throw rugs, attach them to the floor with carpet  tape.  Make sure that you have a light switch at the top of the stairs and the bottom of the stairs. If you do not have them, ask someone to add them for you. What else can I do to help prevent falls?  Wear shoes that:  Do not have high heels.  Have rubber bottoms.  Are comfortable and fit you well.  Are closed at the toe. Do not wear sandals.  If you use a stepladder:  Make sure that it is fully opened. Do not climb a closed stepladder.  Make sure that both sides of the stepladder are locked into place.  Ask someone to hold it for you, if possible.  Clearly mark and make sure that you can see:  Any grab bars or handrails.  First and last steps.  Where the edge of each step is.  Use tools that help you move around (mobility aids) if they are needed. These include:  Canes.  Walkers.  Scooters.  Crutches.  Turn on the lights when you go into a dark area. Replace any light bulbs as soon as they burn out.  Set up your furniture so you have a clear path. Avoid moving your furniture around.  If any of your floors are uneven, fix them.  If there are any pets around you, be aware of where they are.  Review your medicines with your doctor. Some medicines can make you feel dizzy. This can increase your chance of falling. Ask your doctor what other things that you can do to help prevent falls. This information is not intended to replace advice given to you by your health care provider. Make sure you discuss any questions you have with your health care provider. Document Released: 03/28/2009 Document Revised: 11/07/2015 Document Reviewed: 07/06/2014 Elsevier Interactive Patient Education  2017 [employer].

## 2017-11-19 ENCOUNTER — Other Ambulatory Visit: Payer: Self-pay

## 2017-11-19 ENCOUNTER — Ambulatory Visit: Payer: Medicare Other

## 2017-11-19 ENCOUNTER — Ambulatory Visit
Admission: EM | Admit: 2017-11-19 | Discharge: 2017-11-19 | Disposition: A | Discharge status: Home / Self Care | Payer: Medicare Other | Attending: Family Medicine | Admitting: Family Medicine

## 2017-11-19 ENCOUNTER — Ambulatory Visit
Admit: 2017-11-19 | Discharge: 2017-11-19 | Disposition: A | Discharge status: Home / Self Care | Payer: Medicare Other | Attending: Emergency Medicine | Admitting: Emergency Medicine

## 2017-11-19 DIAGNOSIS — Z8249 Family history of ischemic heart disease and other diseases of the circulatory system: Secondary | ICD-10-CM | POA: Insufficient documentation

## 2017-11-19 DIAGNOSIS — Z96611 Presence of right artificial shoulder joint: Secondary | ICD-10-CM | POA: Diagnosis not present

## 2017-11-19 DIAGNOSIS — R262 Difficulty in walking, not elsewhere classified: Secondary | ICD-10-CM | POA: Diagnosis not present

## 2017-11-19 DIAGNOSIS — W01190A Fall on same level from slipping, tripping and stumbling with subsequent striking against furniture, initial encounter: Secondary | ICD-10-CM | POA: Diagnosis not present

## 2017-11-19 DIAGNOSIS — Z823 Family history of stroke: Secondary | ICD-10-CM | POA: Diagnosis not present

## 2017-11-19 DIAGNOSIS — S0181XA Laceration without foreign body of other part of head, initial encounter: Secondary | ICD-10-CM | POA: Diagnosis not present

## 2017-11-19 DIAGNOSIS — Z96653 Presence of artificial knee joint, bilateral: Secondary | ICD-10-CM | POA: Diagnosis not present

## 2017-11-19 DIAGNOSIS — M48062 Spinal stenosis, lumbar region with neurogenic claudication: Secondary | ICD-10-CM | POA: Diagnosis not present

## 2017-11-19 DIAGNOSIS — S0101XA Laceration without foreign body of scalp, initial encounter: Secondary | ICD-10-CM | POA: Diagnosis not present

## 2017-11-19 DIAGNOSIS — S0990XA Unspecified injury of head, initial encounter: Secondary | ICD-10-CM | POA: Diagnosis not present

## 2017-11-19 DIAGNOSIS — M5416 Radiculopathy, lumbar region: Secondary | ICD-10-CM | POA: Diagnosis not present

## 2017-11-19 DIAGNOSIS — Z85828 Personal history of other malignant neoplasm of skin: Secondary | ICD-10-CM | POA: Diagnosis not present

## 2017-11-19 DIAGNOSIS — Z820 Family history of epilepsy and other diseases of the nervous system: Secondary | ICD-10-CM | POA: Diagnosis not present

## 2017-11-19 DIAGNOSIS — Z881 Allergy status to other antibiotic agents status: Secondary | ICD-10-CM | POA: Insufficient documentation

## 2017-11-19 DIAGNOSIS — W01198A Fall on same level from slipping, tripping and stumbling with subsequent striking against other object, initial encounter: Secondary | ICD-10-CM | POA: Diagnosis not present

## 2017-11-19 DIAGNOSIS — Y92003 Bedroom of unspecified non-institutional (private) residence as the place of occurrence of the external cause: Secondary | ICD-10-CM | POA: Insufficient documentation

## 2017-11-19 NOTE — Discharge Instructions (Signed)
Apply ice 20 minutes out of every 2 hours 4-5 times daily for comfort. If You notice any changes with concentration or vision etc. go to the emergency room. Plan for suture removal in 7 days

## 2017-11-19 NOTE — ED Triage Notes (Signed)
Patient complains of a head injury that occurred while at her house today. Patient states that she stepped on her cat and fell backwards and hit her head on her wooden bed frame. Patient is bleeding and complains of headache.

## 2017-11-19 NOTE — ED Provider Notes (Addendum)
[facility]    CSN: [id number] Arrival date & time: 11/19/17  1303     History   Chief Complaint Chief Complaint  Patient presents with  . Head Injury    HPI Connie Mitchell is a 84 y.o. female.   HPI  84 year old female presents with laceration to her head  that occurred at  her house today.  States that she stepped on her cat and fell backwards hitting her head on her wooden bed frame.  No loss of consciousness.  Has had no nausea or vomiting.  Examination of the head shows no deformities.  She does have a significant amount of blood clotted into her hair.  No identifiable laceration is seen prior to the patient being cleaned up by the nursing staff.  Has no use of anticoagulant medication.  Complain of trouble focusing with her right eye.  Pressing on the hematoma on her occipital region causes her to have sinus pain and pressure.  She is current on her tetanus toxoid        Past Medical History:  Diagnosis Date  . GERD (gastroesophageal reflux disease)   . Primary osteoarthritis     Patient Active Problem List   Diagnosis Date Noted  . Lumbar degenerative disc disease 09/24/2017  . Hx of Lyme disease 09/24/2017  . Hx of basal cell carcinoma 09/24/2017  . Gastroesophageal reflux disease 09/24/2017  . Elevated blood pressure reading 09/24/2017  . Nuclear sclerotic cataract of left eye 08/09/2015  . Osteoporosis 07/04/2014  . Primary localized osteoarthrosis of shoulder region 12/13/2012  . History of breast cancer 12/18/2011  . Osteoarthritis 12/18/2011    Past Surgical History:  Procedure Laterality Date  . cataracts Bilateral   . CYSTOCELE REPAIR  2010  . MASTECTOMY PARTIAL / LUMPECTOMY Left 1991   cancer  . OOPHORECTOMY  1995   ovarian mass - benign  . REPLACEMENT TOTAL KNEE Bilateral   . TONSILLECTOMY  1958  . TOTAL SHOULDER ARTHROPLASTY Right     OB History   None      Home Medications    Prior to Admission medications   Medication  Sig Start Date End Date Taking? Authorizing Provider  Ibuprofen (ADVIL) 200 MG CAPS    Yes [provider]  loratadine (CLARITIN REDITABS) 10 MG dissolvable tablet Take 10 mg by mouth daily. 10/13/17  Yes [provider]  Papaya Enzyme CHEW  06/15/16  Yes [provider]  doxycycline (VIBRAMYCIN) 100 MG capsule Take 1 capsule (100 mg total) by mouth 2 (two) times daily. 10/20/17   Coral Spikes, DO    Family History Family History  Problem Relation Age of Onset  . Hypertension Mother   . Alzheimer's disease Mother   . Stroke Father   . Cancer Brother        prostate  . Atrial fibrillation Brother   . Cancer Other        breast  . Atrial fibrillation Brother     Social History Social History   Tobacco Use  . Smoking status: Never Smoker  . Smokeless tobacco: Never Used  . Tobacco comment: smoking cessation materials not required  Substance Use Topics  . Alcohol use: Yes    Comment: occasional  . Drug use: Never     Allergies   Ciprofloxacin   Review of Systems Review of Systems  Constitutional: Negative for activity change, appetite change, chills, diaphoresis, fatigue and fever.  Skin: Positive for wound.  Neurological: Positive for headaches.  All other systems reviewed and are negative.    Physical Exam Triage Vital Signs ED Triage Vitals  Enc Vitals Group     BP 11/19/17 1308 (!) 156/92     Pulse Rate 11/19/17 1308 83     Resp 11/19/17 1308 16     Temp 11/19/17 1308 99.1 F (37.3 C)     Temp Source 11/19/17 1308 Oral     SpO2 11/19/17 1308 99 %     Weight 11/19/17 1306 176 lb (79.8 kg)     Height 11/19/17 1306 5\' 4"  (1.626 m)     Head Circumference --      Peak Flow --      Pain Score 11/19/17 1306 4     Pain Loc --      Pain Edu? --      Excl. in [place]? --    No data found.  Updated Vital Signs BP (!) 156/92 (BP Location: Left Arm)   Pulse 83   Temp 99.1 F (37.3 C) (Oral)   Resp 16   Ht 5\' 4"  (1.626 m)   Wt 176 lb  (79.8 kg)   SpO2 99%   BMI 30.21 kg/m   Visual Acuity Right Eye Distance:   Left Eye Distance:   Bilateral Distance:    Right Eye Near:   Left Eye Near:    Bilateral Near:     Physical Exam  Constitutional: She is oriented to person, place, and time. She appears well-developed and well-nourished. No distress.  HENT:  Head: Normocephalic.  Eyes: Pupils are equal, round, and reactive to light. EOM are normal. Right eye exhibits no discharge. Left eye exhibits no discharge.  Neck: Normal range of motion. Neck supple.  Musculoskeletal: Normal range of motion.  Neurological: She is alert and oriented to person, place, and time. No cranial nerve deficit or sensory deficit. She exhibits normal muscle tone. Coordination normal.  Skin: Skin is warm and dry. She is not diaphoretic.  Examination of the occipital region right head shows a small 1 cm laceration to scalp full-thickness to the skull.  Effects are palpable.  Patient does have a hematoma in in the same area.   CAT Scan was negative for bleed or skull fracture.  Psychiatric: She has a normal mood and affect. Her behavior is normal. Judgment and thought content normal.  Nursing note and vitals reviewed.    UC Treatments / Results  Labs (all labs ordered are listed, but only abnormal results are displayed) Labs Reviewed - No data to display  EKG None  Radiology Ct Head Wo Contrast  Result Date: 11/19/2017 CLINICAL DATA:  Fall, laceration to back of head. EXAM: CT HEAD WITHOUT CONTRAST TECHNIQUE: Contiguous axial images were obtained from the base of the skull through the vertex without intravenous contrast. COMPARISON:  None. FINDINGS: Brain: Mild chronic small vessel ischemic change within the bilateral periventricular white matter and LEFT basal ganglia. No mass, hemorrhage, edema or evidence of acute parenchymal abnormality. No extra-axial hemorrhage. Vascular: There are chronic calcified atherosclerotic changes of the large  vessels at the skull base. No unexpected hyperdense vessel. Skull: Normal. Negative for fracture or focal lesion. Sinuses/Orbits: No acute finding. Other: Soft tissue edema/laceration overlying the RIGHT occipital bone. IMPRESSION: 1. Focal soft tissue edema/laceration overlying the RIGHT occipital bone. No underlying skull fracture. 2. No acute intracranial abnormality. No intracranial mass, hemorrhage or edema. 3. Chronic small vessel ischemic changes within the white matter and LEFT basal ganglia. Electronically Signed  By: Franki Cabot M.D.   On: 11/19/2017 14:30    Procedures Laceration Repair Date/Time: 11/19/2017 3:41 PM Performed by: Lorin Picket, PA-C Authorized by: Coral Spikes, DO   Consent:    Consent obtained:  Verbal   Consent given by:  Patient   Risks discussed:  Infection and pain Anesthesia (see MAR for exact dosages):    Anesthesia method:  Local infiltration   Local anesthetic:  Lidocaine 1% WITH epi Laceration details:    Location:  Scalp   Scalp location:  Occipital   Length (cm):  1   Depth (mm):  3 Repair type:    Repair type:  Simple Pre-procedure details:    Preparation:  Patient was prepped and draped in usual sterile fashion Exploration:    Hemostasis achieved with:  Epinephrine   Wound exploration: entire depth of wound probed and visualized     Contaminated: no   Treatment:    Area cleansed with:  Betadine   Amount of cleaning:  Standard   Irrigation solution:  Sterile saline   Irrigation volume:  30   Irrigation method:  Syringe   Visualized foreign bodies/material removed: no   Skin repair:    Repair method:  Staples   Number of staples:  4 Approximation:    Approximation:  Close Post-procedure details:    Dressing:  Sterile dressing   Patient tolerance of procedure:  Tolerated well, no immediate complications Comments:     Suture removal in 7 days   (including critical care time)  Medications Ordered in UC Medications - No data  to display  Initial Impression / Assessment and Plan / UC Course  I have reviewed the triage vital signs and the nursing notes.  Pertinent labs & imaging results that were available during my care of the patient were reviewed by me and considered in my medical decision making (see chart for details).     Plan: 1. Test/x-ray results and diagnosis reviewed with patient 2. rx as per orders; risks, benefits, potential side effects reviewed with patient 3. Recommend supportive treatment with being dry for 24 hours and then washing as necessary.  There is any change in her concentration, mentation, vision, speech or coordination she should go immediately to the emergency room.  Plan on suture removal in 7 days 4. F/u prn if symptoms worsen or don't improve  Final Clinical Impressions(s) / UC Diagnoses   Final diagnoses:  Injury of head, initial encounter  Laceration of scalp, initial encounter     Discharge Instructions     Apply ice 20 minutes out of every 2 hours 4-5 times daily for comfort. If You notice any changes with concentration or vision etc. go to the emergency room. Plan for suture removal in 7 days    ED Prescriptions    None     Controlled Substance Prescriptions [place] Controlled Substance Registry consulted? Not Applicable   Lorin Picket, PA-C 11/19/17 1549    Lorin Picket, PA-C 11/19/17 1551

## 2017-11-25 ENCOUNTER — Encounter: Payer: Self-pay | Admitting: Internal Medicine

## 2017-11-25 ENCOUNTER — Ambulatory Visit (INDEPENDENT_AMBULATORY_CARE_PROVIDER_SITE_OTHER): Payer: Medicare Other | Admitting: Internal Medicine

## 2017-11-25 VITALS — BP 112/74 | HR 70 | Resp 16 | Ht 64.0 in | Wt 175.4 lb

## 2017-11-25 DIAGNOSIS — Z1231 Encounter for screening mammogram for malignant neoplasm of breast: Secondary | ICD-10-CM

## 2017-11-25 DIAGNOSIS — G8929 Other chronic pain: Secondary | ICD-10-CM | POA: Diagnosis not present

## 2017-11-25 DIAGNOSIS — S0101XA Laceration without foreign body of scalp, initial encounter: Secondary | ICD-10-CM | POA: Diagnosis not present

## 2017-11-25 DIAGNOSIS — R030 Elevated blood-pressure reading, without diagnosis of hypertension: Secondary | ICD-10-CM

## 2017-11-25 DIAGNOSIS — M129 Arthropathy, unspecified: Secondary | ICD-10-CM

## 2017-11-25 DIAGNOSIS — M79673 Pain in unspecified foot: Secondary | ICD-10-CM | POA: Diagnosis not present

## 2017-11-25 DIAGNOSIS — R058 Other specified cough: Secondary | ICD-10-CM

## 2017-11-25 DIAGNOSIS — R05 Cough: Secondary | ICD-10-CM | POA: Diagnosis not present

## 2017-11-25 MED ORDER — MONTELUKAST SODIUM 10 MG PO TABS: 10.0000 mg | ORAL_TABLET | Freq: Every day | ORAL | 5 refills | Status: DC

## 2017-11-25 NOTE — Progress Notes (Signed)
Date:  11/25/2017   Name:  Connie Mitchell   DOB:  [birth year]   MRN:  [id number]   Chief Complaint: Hypertension and Fall ([place] 11/19/2017- Tripped over Cat and had head injury got staples that will come out tomorrow) Hypertension  This is a recurrent problem. The problem has been waxing and waning since onset. The problem is controlled (readings at home have been normal). Pertinent negatives include no chest pain, headaches, palpitations or shortness of breath. There are no known risk factors for coronary artery disease. Past treatments include nothing.  Fall  The accident occurred 5 to 7 days ago. The fall occurred while standing. The point of impact was the head. The patient is experiencing no pain. Pertinent negatives include no abdominal pain, fever or headaches. Treatments tried: got staples in ED.  Cough  This is a recurrent problem. The problem has been waxing and waning. The cough is non-productive. Associated symptoms include postnasal drip and wheezing. Pertinent negatives include no chest pain, chills, ear congestion, ear pain, fever, headaches or shortness of breath. The symptoms are aggravated by cold air and pollens.  Foot pain - has bilateral foot pain and deformity.  It contributes to her walking and balance difficulties.  Elbow pain - worse on left elbow - no injury and did not get worse since the fall.  BP Readings from Last 3 Encounters:  11/25/17 112/74  11/19/17 (!) 156/92  11/17/17 124/60      Review of Systems  Constitutional: Negative for chills and fever.  HENT: Positive for postnasal drip. Negative for ear pain.   Respiratory: Positive for cough and wheezing. Negative for shortness of breath.   Cardiovascular: Negative for chest pain, palpitations and leg swelling.  Gastrointestinal: Negative for abdominal pain.  Musculoskeletal: Positive for back pain, gait problem and joint swelling (left elbow). Negative for arthralgias.  Neurological: Negative for dizziness  and headaches.  Hematological: Negative for adenopathy.  Psychiatric/Behavioral: Negative for sleep disturbance. The patient is not nervous/anxious.     Patient Active Problem List   Diagnosis Date Noted  . Lumbar degenerative disc disease 09/24/2017  . Hx of Lyme disease 09/24/2017  . Hx of basal cell carcinoma 09/24/2017  . Gastroesophageal reflux disease 09/24/2017  . Elevated blood pressure reading 09/24/2017  . Nuclear sclerotic cataract of left eye 08/09/2015  . Osteoporosis 07/04/2014  . Primary localized osteoarthrosis of shoulder region 12/13/2012  . History of breast cancer 12/18/2011  . Osteoarthritis 12/18/2011    Prior to Admission medications   Medication Sig Start Date End Date Taking? Authorizing Provider  Ibuprofen (ADVIL) 200 MG CAPS 2 capsules as needed.    Yes [provider]  loratadine (CLARITIN REDITABS) 10 MG dissolvable tablet Take 10 mg by mouth daily. 10/13/17  Yes [provider]  Papaya Enzyme CHEW 3 (three) times daily with meals.  06/15/16  Yes [provider]    Allergies  Allergen Reactions  . Ciprofloxacin Nausea And Vomiting    Past Surgical History:  Procedure Laterality Date  . cataracts Bilateral   . CYSTOCELE REPAIR  2010  . MASTECTOMY PARTIAL / LUMPECTOMY Left 1991   cancer  . OOPHORECTOMY  1995   ovarian mass - benign  . REPLACEMENT TOTAL KNEE Bilateral   . TONSILLECTOMY  1958  . TOTAL SHOULDER ARTHROPLASTY Right     Social History   Tobacco Use  . Smoking status: Never Smoker  . Smokeless tobacco: Never Used  . Tobacco comment: smoking cessation materials not required  Substance Use Topics  . Alcohol use: Yes    Comment: occasional  . Drug use: Never     Medication list has been reviewed and updated.  Current Meds  Medication Sig  . Ibuprofen (ADVIL) 200 MG CAPS 2 capsules as needed.   . loratadine (CLARITIN REDITABS) 10 MG dissolvable tablet Take 10 mg by mouth daily.  . Papaya Enzyme CHEW  3 (three) times daily with meals.     PHQ 2/9 Scores 11/17/2017 09/24/2017 09/24/2017  PHQ - 2 Score 0 0 0  PHQ- 9 Score 0 4 -    Physical Exam  Constitutional: She is oriented to person, place, and time. She appears well-developed. No distress.  HENT:  Head: Normocephalic and atraumatic.  Neck: Normal range of motion. Neck supple.  Cardiovascular: Normal rate, regular rhythm and normal heart sounds.  Pulmonary/Chest: Effort normal and breath sounds normal. No respiratory distress. She has no wheezes. She has no rales.  Musculoskeletal: She exhibits edema and tenderness.       Right shoulder: She exhibits decreased range of motion.       Left shoulder: She exhibits decreased range of motion. She exhibits no tenderness and no bony tenderness.       Right elbow: Normal.      Left elbow: She exhibits decreased range of motion. She exhibits no swelling, no effusion and no deformity.  Lymphadenopathy:    She has no cervical adenopathy.  Neurological: She is alert and oriented to person, place, and time.  Skin: Skin is warm and dry. No rash noted.     Psychiatric: She has a normal mood and affect. Her speech is normal and behavior is normal. Thought content normal.  Nursing note and vitals reviewed.   BP 112/74   Pulse 70   Resp 16   Ht 5\' 4"  (1.626 m)   Wt 175 lb 6.4 oz (79.6 kg)   SpO2 96%   BMI 30.11 kg/m   Assessment and Plan: 1. Elevated blood pressure reading Normalized Continue to monitor weekly  2. Allergic cough Continue Claritin; add singulair - montelukast (SINGULAIR) 10 MG tablet; Take 1 tablet (10 mg total) by mouth at bedtime.  Dispense: 30 tablet; Refill: 5  3. Laceration of occipital scalp, initial encounter Follow up tomorrow for staple remover  4. Arthritis involving multiple sites Refer to Podiatry for feet Take tylenol for elbow pain Continue Nicole Kindred PT for back problems   Meds ordered this encounter  Medications  . montelukast (SINGULAIR) 10 MG  tablet    Sig: Take 1 tablet (10 mg total) by mouth at bedtime.    Dispense:  30 tablet    Refill:  5    Partially dictated using Editor, commissioning. Any errors are unintentional.  Halina Maidens, MD [facility]  11/25/2017

## 2017-11-25 NOTE — Patient Instructions (Signed)
Tylenol 650 mg four times a day as needed

## 2017-11-26 ENCOUNTER — Encounter: Payer: Self-pay | Admitting: Emergency Medicine

## 2017-11-26 ENCOUNTER — Other Ambulatory Visit: Payer: Self-pay

## 2017-11-26 ENCOUNTER — Ambulatory Visit
Admission: EM | Admit: 2017-11-26 | Discharge: 2017-11-26 | Disposition: A | Discharge status: Home / Self Care | Payer: Medicare Other

## 2017-11-26 DIAGNOSIS — Z4802 Encounter for removal of sutures: Secondary | ICD-10-CM | POA: Diagnosis not present

## 2017-11-26 DIAGNOSIS — S0101XD Laceration without foreign body of scalp, subsequent encounter: Secondary | ICD-10-CM | POA: Diagnosis not present

## 2017-11-26 NOTE — ED Notes (Signed)
4 Staples removed from back of patient's head. Patient tolerated well. No complaints or concerns at this time.

## 2017-11-29 ENCOUNTER — Ambulatory Visit
Admission: RE | Admit: 2017-11-29 | Discharge: 2017-11-29 | Disposition: A | Discharge status: Home / Self Care | Payer: Medicare Other | Source: Ambulatory Visit | Attending: Internal Medicine | Admitting: Internal Medicine

## 2017-11-29 DIAGNOSIS — E2839 Other primary ovarian failure: Secondary | ICD-10-CM | POA: Insufficient documentation

## 2017-11-29 DIAGNOSIS — Z780 Asymptomatic menopausal state: Secondary | ICD-10-CM | POA: Diagnosis not present

## 2017-11-29 DIAGNOSIS — M85851 Other specified disorders of bone density and structure, right thigh: Secondary | ICD-10-CM | POA: Diagnosis not present

## 2017-12-01 DIAGNOSIS — M48062 Spinal stenosis, lumbar region with neurogenic claudication: Secondary | ICD-10-CM | POA: Diagnosis not present

## 2017-12-01 DIAGNOSIS — R262 Difficulty in walking, not elsewhere classified: Secondary | ICD-10-CM | POA: Diagnosis not present

## 2017-12-01 DIAGNOSIS — M5416 Radiculopathy, lumbar region: Secondary | ICD-10-CM | POA: Diagnosis not present

## 2017-12-03 DIAGNOSIS — M5416 Radiculopathy, lumbar region: Secondary | ICD-10-CM | POA: Diagnosis not present

## 2017-12-03 DIAGNOSIS — R262 Difficulty in walking, not elsewhere classified: Secondary | ICD-10-CM | POA: Diagnosis not present

## 2017-12-03 DIAGNOSIS — M48062 Spinal stenosis, lumbar region with neurogenic claudication: Secondary | ICD-10-CM | POA: Diagnosis not present

## 2017-12-06 DIAGNOSIS — M79671 Pain in right foot: Secondary | ICD-10-CM | POA: Diagnosis not present

## 2017-12-06 DIAGNOSIS — M19071 Primary osteoarthritis, right ankle and foot: Secondary | ICD-10-CM | POA: Diagnosis not present

## 2017-12-06 DIAGNOSIS — M2142 Flat foot [pes planus] (acquired), left foot: Secondary | ICD-10-CM | POA: Diagnosis not present

## 2017-12-06 DIAGNOSIS — M79672 Pain in left foot: Secondary | ICD-10-CM | POA: Diagnosis not present

## 2017-12-06 DIAGNOSIS — L602 Onychogryphosis: Secondary | ICD-10-CM | POA: Diagnosis not present

## 2017-12-06 DIAGNOSIS — M2141 Flat foot [pes planus] (acquired), right foot: Secondary | ICD-10-CM | POA: Diagnosis not present

## 2018-01-13 DIAGNOSIS — L03032 Cellulitis of left toe: Secondary | ICD-10-CM

## 2018-01-13 DIAGNOSIS — L02612 Cutaneous abscess of left foot: Secondary | ICD-10-CM

## 2018-01-13 HISTORY — DX: Cellulitis of left toe: L02.612

## 2018-01-13 HISTORY — DX: Cellulitis of left toe: L03.032

## 2018-01-21 ENCOUNTER — Encounter: Payer: Self-pay | Admitting: Family Medicine

## 2018-01-21 ENCOUNTER — Encounter: Payer: Self-pay | Admitting: Internal Medicine

## 2018-01-21 ENCOUNTER — Ambulatory Visit (INDEPENDENT_AMBULATORY_CARE_PROVIDER_SITE_OTHER): Payer: Medicare Other | Admitting: Family Medicine

## 2018-01-21 VITALS — BP 130/70 | HR 64 | Ht 64.0 in | Wt 174.0 lb

## 2018-01-21 DIAGNOSIS — L03032 Cellulitis of left toe: Secondary | ICD-10-CM | POA: Diagnosis not present

## 2018-01-21 MED ORDER — DOXYCYCLINE HYCLATE 100 MG PO TABS: 100.0000 mg | ORAL_TABLET | Freq: Two times a day (BID) | ORAL | 0 refills | Status: DC

## 2018-01-21 MED ORDER — MUPIROCIN 2 % EX OINT: 1.0000 "application " | TOPICAL_OINTMENT | Freq: Two times a day (BID) | CUTANEOUS | 0 refills | Status: DC

## 2018-01-21 NOTE — Progress Notes (Signed)
Name: Connie Mitchell   MRN: [id number]    DOB: [birth year]   Date:01/21/2018       Progress Note  Subjective  Chief Complaint  Chief Complaint  Patient presents with  . Rash    had a place come up on L) great toe that was "blistery looking'- she has "drained it" twice and it keeps coming back- doesn't itch as bad as it did, but is still painful    Rash  This is a new problem. The current episode started in the past 7 days (1 week). The problem has been gradually worsening since onset. The affected locations include the left toes. The rash is characterized by blistering (erythema). She was exposed to nothing. Pertinent negatives include no anorexia, congestion, cough, diarrhea, eye pain, facial edema, fatigue, fever, joint pain, nail changes, rhinorrhea, shortness of breath, sore throat or vomiting. Past treatments include nothing.    No problem-specific Assessment & Plan notes found for this encounter.   Past Medical History:  Diagnosis Date  . GERD (gastroesophageal reflux disease)   . Primary osteoarthritis     Past Surgical History:  Procedure Laterality Date  . cataracts Bilateral   . CYSTOCELE REPAIR  2010  . MASTECTOMY PARTIAL / LUMPECTOMY Left 1991   cancer  . OOPHORECTOMY  1995   ovarian mass - benign  . REPLACEMENT TOTAL KNEE Bilateral   . TONSILLECTOMY  1958  . TOTAL SHOULDER ARTHROPLASTY Right     Family History  Problem Relation Age of Onset  . Hypertension Mother   . Alzheimer's disease Mother   . Stroke Father   . Cancer Brother        prostate  . Atrial fibrillation Brother   . Cancer Other        breast  . Atrial fibrillation Brother     Social History   Socioeconomic History  . Marital status: Single    Spouse name: Not on file  . Number of children: 0  . Years of education: Not on file  . Highest education level: Master's degree (e.g., MA, MS, MEng, MEd, MSW, MBA)  Occupational History  . Occupation: Retired  Scientific laboratory technician  . Financial  resource strain: Not hard at all  . Food insecurity:    Worry: Never true    Inability: Never true  . Transportation needs:    Medical: No    Non-medical: No  Tobacco Use  . Smoking status: Never Smoker  . Smokeless tobacco: Never Used  . Tobacco comment: smoking cessation materials not required  Substance and Sexual Activity  . Alcohol use: Yes    Comment: occasional  . Drug use: Never  . Sexual activity: Not Currently  Lifestyle  . Physical activity:    Days per week: 4 days    Minutes per session: Not on file  . Stress: Not at all  Relationships  . Social connections:    Talks on phone: Patient refused    Gets together: Patient refused    Attends religious service: Patient refused    Active member of club or organization: Patient refused    Attends meetings of clubs or organizations: Patient refused    Relationship status: Patient refused  . Intimate partner violence:    Fear of current or ex partner: No    Emotionally abused: No    Physically abused: No    Forced sexual activity: No  Other Topics Concern  . Not on file  Social History Narrative  . Not on file  Allergies  Allergen Reactions  . Ciprofloxacin Nausea And Vomiting    Outpatient Medications Prior to Visit  Medication Sig Dispense Refill  . Ibuprofen (ADVIL) 200 MG CAPS 2 capsules as needed.     . loratadine (CLARITIN REDITABS) 10 MG dissolvable tablet Take 10 mg by mouth daily.    . montelukast (SINGULAIR) 10 MG tablet Take 1 tablet (10 mg total) by mouth at bedtime. 30 tablet 5  . Papaya Enzyme CHEW 3 (three) times daily with meals.      No facility-administered medications prior to visit.     Review of Systems  Constitutional: Negative for chills, fatigue, fever, malaise/fatigue and weight loss.  HENT: Negative for congestion, ear discharge, ear pain, rhinorrhea and sore throat.   Eyes: Negative for blurred vision and pain.  Respiratory: Negative for cough, sputum production, shortness of  breath and wheezing.   Cardiovascular: Negative for chest pain, palpitations and leg swelling.  Gastrointestinal: Negative for abdominal pain, anorexia, blood in stool, constipation, diarrhea, heartburn, melena, nausea and vomiting.  Genitourinary: Negative for dysuria, frequency, hematuria and urgency.  Musculoskeletal: Negative for back pain, joint pain, myalgias and neck pain.  Skin: Positive for rash. Negative for nail changes.  Neurological: Negative for dizziness, tingling, sensory change, focal weakness and headaches.  Endo/Heme/Allergies: Negative for environmental allergies and polydipsia. Does not bruise/bleed easily.  Psychiatric/Behavioral: Negative for depression and suicidal ideas. The patient is not nervous/anxious and does not have insomnia.      Objective  Vitals:   01/21/18 1032  BP: 130/70  Pulse: 64  Weight: 174 lb (78.9 kg)  Height: 5\' 4"  (1.626 m)    Physical Exam  Constitutional: She is oriented to person, place, and time. She appears well-developed and well-nourished.  HENT:  Head: Normocephalic.  Right Ear: External ear normal.  Left Ear: External ear normal.  Mouth/Throat: Oropharynx is clear and moist.  Eyes: Pupils are equal, round, and reactive to light. Conjunctivae and EOM are normal. Lids are everted and swept, no foreign bodies found. Left eye exhibits no hordeolum. No foreign body present in the left eye. Right conjunctiva is not injected. Left conjunctiva is not injected. No scleral icterus.  Neck: Normal range of motion. Neck supple. No JVD present. No tracheal deviation present. No thyromegaly present.  Cardiovascular: Normal rate, regular rhythm, normal heart sounds and intact distal pulses. Exam reveals no gallop and no friction rub.  No murmur heard. Pulmonary/Chest: Effort normal and breath sounds normal. No respiratory distress. She has no wheezes. She has no rales.  Abdominal: Soft. Bowel sounds are normal. She exhibits no mass. There is no  hepatosplenomegaly. There is no tenderness. There is no rebound and no guarding.  Musculoskeletal: Normal range of motion. She exhibits no edema or tenderness.  Lymphadenopathy:    She has no cervical adenopathy.  Neurological: She is alert and oriented to person, place, and time. She has normal strength. She displays normal reflexes. No cranial nerve deficit.  Skin: Skin is warm. No rash noted. There is erythema.  vesicle  Psychiatric: She has a normal mood and affect. Her mood appears not anxious. She does not exhibit a depressed mood.      Assessment & Plan  Problem List Items Addressed This Visit    None    Visit Diagnoses    Cellulitis of toe of left foot    -  Primary   Relevant Medications   doxycycline (VIBRA-TABS) 100 MG tablet   mupirocin ointment (BACTROBAN) 2 %  Meds ordered this encounter  Medications  . doxycycline (VIBRA-TABS) 100 MG tablet    Sig: Take 1 tablet (100 mg total) by mouth 2 (two) times daily.    Dispense:  20 tablet    Refill:  0  . mupirocin ointment (BACTROBAN) 2 %    Sig: Apply 1 application topically 2 (two) times daily.    Dispense:  22 g    Refill:  0      Dr. Macon [facility]  01/21/18

## 2018-01-31 ENCOUNTER — Encounter: Payer: Self-pay | Admitting: Internal Medicine

## 2018-02-02 ENCOUNTER — Ambulatory Visit (INDEPENDENT_AMBULATORY_CARE_PROVIDER_SITE_OTHER): Payer: Medicare Other | Admitting: Family Medicine

## 2018-02-02 ENCOUNTER — Encounter: Payer: Self-pay | Admitting: Family Medicine

## 2018-02-02 VITALS — BP 130/80 | HR 64 | Ht 64.0 in | Wt 174.0 lb

## 2018-02-02 DIAGNOSIS — I872 Venous insufficiency (chronic) (peripheral): Secondary | ICD-10-CM

## 2018-02-02 DIAGNOSIS — L139 Bullous disorder, unspecified: Secondary | ICD-10-CM

## 2018-02-02 NOTE — Progress Notes (Signed)
Name: Connie Mitchell   MRN: [id number]    DOB: 02-27-1934   Date:02/02/2018       Progress Note  Subjective  Chief Complaint  Chief Complaint  Patient presents with  . Cellulitis    follow up on L) great toe bite- not really "blistered this morning"    Recheck of bullous area of right 2nd toe. Decreased erythema/tenderness. Improving with reepithelialization. Continue present regimen.   No problem-specific Assessment & Plan notes found for this encounter.   Past Medical History:  Diagnosis Date  . GERD (gastroesophageal reflux disease)   . Primary osteoarthritis     Past Surgical History:  Procedure Laterality Date  . cataracts Bilateral   . CYSTOCELE REPAIR  2010  . MASTECTOMY PARTIAL / LUMPECTOMY Left 1991   cancer  . OOPHORECTOMY  1995   ovarian mass - benign  . REPLACEMENT TOTAL KNEE Bilateral   . TONSILLECTOMY  1958  . TOTAL SHOULDER ARTHROPLASTY Right     Family History  Problem Relation Age of Onset  . Hypertension Mother   . Alzheimer's disease Mother   . Stroke Father   . Cancer Brother        prostate  . Atrial fibrillation Brother   . Cancer Other        breast  . Atrial fibrillation Brother     Social History   Socioeconomic History  . Marital status: Single    Spouse name: Not on file  . Number of children: 0  . Years of education: Not on file  . Highest education level: Master's degree (e.g., MA, MS, MEng, MEd, MSW, MBA)  Occupational History  . Occupation: Retired  Scientific laboratory technician  . Financial resource strain: Not hard at all  . Food insecurity:    Worry: Never true    Inability: Never true  . Transportation needs:    Medical: No    Non-medical: No  Tobacco Use  . Smoking status: Never Smoker  . Smokeless tobacco: Never Used  . Tobacco comment: smoking cessation materials not required  Substance and Sexual Activity  . Alcohol use: Yes    Comment: occasional  . Drug use: Never  . Sexual activity: Not Currently  Lifestyle  .  Physical activity:    Days per week: 4 days    Minutes per session: Not on file  . Stress: Not at all  Relationships  . Social connections:    Talks on phone: Patient refused    Gets together: Patient refused    Attends religious service: Patient refused    Active member of club or organization: Patient refused    Attends meetings of clubs or organizations: Patient refused    Relationship status: Patient refused  . Intimate partner violence:    Fear of current or ex partner: No    Emotionally abused: No    Physically abused: No    Forced sexual activity: No  Other Topics Concern  . Not on file  Social History Narrative  . Not on file    Allergies  Allergen Reactions  . Ciprofloxacin Nausea And Vomiting    Outpatient Medications Prior to Visit  Medication Sig Dispense Refill  . Ibuprofen (ADVIL) 200 MG CAPS 2 capsules as needed.     . loratadine (CLARITIN REDITABS) 10 MG dissolvable tablet Take 10 mg by mouth daily.    . montelukast (SINGULAIR) 10 MG tablet Take 1 tablet (10 mg total) by mouth at bedtime. 30 tablet 5  . mupirocin ointment (BACTROBAN)  2 % Apply 1 application topically 2 (two) times daily. 22 g 0  . Papaya Enzyme CHEW 3 (three) times daily with meals.     Marland Kitchen doxycycline (VIBRA-TABS) 100 MG tablet Take 1 tablet (100 mg total) by mouth 2 (two) times daily. 20 tablet 0   No facility-administered medications prior to visit.     Review of Systems  Constitutional: Negative for chills, fever, malaise/fatigue and weight loss.  HENT: Negative for ear discharge, ear pain and sore throat.   Eyes: Negative for blurred vision.  Respiratory: Negative for cough, sputum production, shortness of breath and wheezing.   Cardiovascular: Negative for chest pain, palpitations and leg swelling.  Gastrointestinal: Negative for abdominal pain, blood in stool, constipation, diarrhea, heartburn, melena and nausea.  Genitourinary: Negative for dysuria, frequency, hematuria and  urgency.  Musculoskeletal: Negative for back pain, joint pain, myalgias and neck pain.  Skin: Negative for rash.  Neurological: Negative for dizziness, tingling, sensory change, focal weakness and headaches.  Endo/Heme/Allergies: Negative for environmental allergies and polydipsia. Does not bruise/bleed easily.  Psychiatric/Behavioral: Negative for depression and suicidal ideas. The patient is not nervous/anxious and does not have insomnia.      Objective  Vitals:   02/02/18 0818  BP: 130/80  Pulse: 64  Weight: 174 lb (78.9 kg)  Height: 5\' 4"  (1.626 m)    Physical Exam  Constitutional: She appears well-developed and well-nourished.  Cardiovascular: Normal rate and normal heart sounds. Exam reveals no gallop and no friction rub.  No murmur heard. Pulses:      Dorsalis pedis pulses are 1+ on the right side, and 1+ on the left side.       Posterior tibial pulses are 1+ on the right side, and 1+ on the left side.  Superficial varicosities bilateral/  Pulmonary/Chest: Breath sounds normal. No respiratory distress. She has no wheezes.  Nursing note and vitals reviewed.     Assessment & Plan  Problem List Items Addressed This Visit    None    Visit Diagnoses    Bullous eruption, localized    -  Primary   Acute. Subsequent visit. Decreased erythema. with reepithelialization. Continue local care.   Venous insufficiency          No orders of the defined types were placed in this encounter.     Dr. [facility]  02/02/18

## 2018-03-14 ENCOUNTER — Ambulatory Visit (INDEPENDENT_AMBULATORY_CARE_PROVIDER_SITE_OTHER): Payer: Medicare Other | Admitting: Internal Medicine

## 2018-03-14 ENCOUNTER — Encounter: Payer: Self-pay | Admitting: Internal Medicine

## 2018-03-14 VITALS — BP 136/84 | HR 76 | Temp 98.3°F | Ht 64.0 in | Wt 178.0 lb

## 2018-03-14 DIAGNOSIS — R05 Cough: Secondary | ICD-10-CM

## 2018-03-14 DIAGNOSIS — Z23 Encounter for immunization: Secondary | ICD-10-CM | POA: Diagnosis not present

## 2018-03-14 DIAGNOSIS — R058 Other specified cough: Secondary | ICD-10-CM

## 2018-03-14 MED ORDER — BENZONATATE 100 MG PO CAPS: 100.0000 mg | ORAL_CAPSULE | Freq: Three times a day (TID) | ORAL | 0 refills | Status: DC | PRN

## 2018-03-14 NOTE — Progress Notes (Signed)
Date:  03/14/2018   Name:  Connie Mitchell   DOB:  1934-01-08   MRN:  [id number]   Chief Complaint: Cough (Having coughing spasms. Started 3 years ago. Happening on and off. Currently not using any inhalers but taking singulair daily. )  Cough  This is a chronic problem. The current episode started more than 1 year ago. The problem occurs every few hours. The cough is non-productive. Associated symptoms include chest pain, postnasal drip and wheezing (with severe spells of cough). Pertinent negatives include no chills, fever, headaches, rhinorrhea or shortness of breath. Associated symptoms comments: Sneezing and coughing, wheezing at the end of exhale. Exacerbated by: starts with a tickle in the throat. She has tried leukotriene antagonists (and claritin) for the symptoms. Her past medical history is significant for environmental allergies. There is no history of asthma or COPD.    Review of Systems  Constitutional: Negative for chills and fever.  HENT: Positive for postnasal drip. Negative for congestion, rhinorrhea, sinus pressure, sinus pain, tinnitus and trouble swallowing.   Respiratory: Positive for cough and wheezing (with severe spells of cough). Negative for shortness of breath.   Cardiovascular: Positive for chest pain.  Allergic/Immunologic: Positive for environmental allergies.  Neurological: Negative for dizziness and headaches.  Hematological: Negative for adenopathy.    Patient Active Problem List   Diagnosis Date Noted  . Allergic cough 11/25/2017  . Arthritis involving multiple sites 11/25/2017  . Chronic foot pain 11/25/2017  . Lumbar degenerative disc disease 09/24/2017  . Hx of Lyme disease 09/24/2017  . Hx of basal cell carcinoma 09/24/2017  . Gastroesophageal reflux disease 09/24/2017  . Elevated blood pressure reading 09/24/2017  . Nuclear sclerotic cataract of left eye 08/09/2015  . Osteoporosis 07/04/2014  . Primary localized osteoarthrosis of shoulder  region 12/13/2012  . History of breast cancer 12/18/2011  . Osteoarthritis 12/18/2011    Allergies  Allergen Reactions  . Ciprofloxacin Nausea And Vomiting    Past Surgical History:  Procedure Laterality Date  . cataracts Bilateral   . CYSTOCELE REPAIR  2010  . MASTECTOMY PARTIAL / LUMPECTOMY Left 1991   cancer  . OOPHORECTOMY  1995   ovarian mass - benign  . REPLACEMENT TOTAL KNEE Bilateral   . TONSILLECTOMY  1958  . TOTAL SHOULDER ARTHROPLASTY Right     Social History   Tobacco Use  . Smoking status: Never Smoker  . Smokeless tobacco: Never Used  . Tobacco comment: smoking cessation materials not required  Substance Use Topics  . Alcohol use: Yes    Comment: occasional  . Drug use: Never     Medication list has been reviewed and updated.  Current Meds  Medication Sig  . Ibuprofen (ADVIL) 200 MG CAPS 2 capsules as needed.   . loratadine (CLARITIN REDITABS) 10 MG dissolvable tablet Take 10 mg by mouth daily.  . montelukast (SINGULAIR) 10 MG tablet Take 1 tablet (10 mg total) by mouth at bedtime.  . Papaya Enzyme CHEW 3 (three) times daily with meals.     PHQ 2/9 Scores 01/21/2018 11/17/2017 09/24/2017 09/24/2017  PHQ - 2 Score 0 0 0 0  PHQ- 9 Score 0 0 4 -    Physical Exam  Constitutional: She is oriented to person, place, and time. She appears well-developed. No distress.  HENT:  Head: Normocephalic and atraumatic.  Right Ear: Tympanic membrane and ear canal normal.  Left Ear: Tympanic membrane and ear canal normal.  Nose: Right sinus exhibits no maxillary sinus tenderness and  no frontal sinus tenderness. Left sinus exhibits no maxillary sinus tenderness and no frontal sinus tenderness.  Mouth/Throat: No oropharyngeal exudate, posterior oropharyngeal edema or posterior oropharyngeal erythema.  Neck: Normal range of motion.  Cardiovascular: Normal rate, regular rhythm and normal heart sounds.  Pulmonary/Chest: Effort normal and breath sounds normal. No  respiratory distress. She has no wheezes. She has no rales. She exhibits tenderness (substernal area).  Musculoskeletal: Normal range of motion.  Neurological: She is alert and oriented to person, place, and time.  Skin: Skin is warm and dry. No rash noted.  Psychiatric: She has a normal mood and affect. Her behavior is normal. Thought content normal.  Nursing note and vitals reviewed.   BP 136/84 (BP Location: Right Arm, Patient Position: Sitting, Cuff Size: Normal)   Pulse 76   Temp 98.3 F (36.8 C) (Oral)   Ht 5\' 4"  (1.626 m)   Wt 178 lb (80.7 kg)   SpO2 96%   BMI 30.55 kg/m   Assessment and Plan: 1. Allergic cough Continue singulair and Claritin Add tessalon and have pulmonary evaluate - Ambulatory referral to Pulmonology - benzonatate (TESSALON) 100 MG capsule; Take 1 capsule (100 mg total) by mouth 3 (three) times daily as needed for cough.  Dispense: 60 capsule; Refill: 0  2. Need for influenza vaccination - Flu vaccine HIGH DOSE PF   Partially dictated using Editor, commissioning. Any errors are unintentional.  Halina Maidens, MD [facility]  03/14/2018

## 2018-03-17 ENCOUNTER — Ambulatory Visit (INDEPENDENT_AMBULATORY_CARE_PROVIDER_SITE_OTHER): Payer: Medicare Other | Admitting: Pulmonary Disease

## 2018-03-17 ENCOUNTER — Ambulatory Visit
Admission: RE | Admit: 2018-03-17 | Discharge: 2018-03-17 | Disposition: A | Discharge status: Home / Self Care | Payer: Medicare Other | Source: Ambulatory Visit | Attending: Pulmonary Disease | Admitting: Pulmonary Disease

## 2018-03-17 ENCOUNTER — Encounter: Payer: Self-pay | Admitting: Pulmonary Disease

## 2018-03-17 VITALS — BP 130/72 | HR 75 | Ht 63.0 in | Wt 177.6 lb

## 2018-03-17 DIAGNOSIS — R05 Cough: Secondary | ICD-10-CM | POA: Diagnosis not present

## 2018-03-17 DIAGNOSIS — R0982 Postnasal drip: Secondary | ICD-10-CM

## 2018-03-17 DIAGNOSIS — R059 Cough, unspecified: Secondary | ICD-10-CM

## 2018-03-17 DIAGNOSIS — K219 Gastro-esophageal reflux disease without esophagitis: Secondary | ICD-10-CM | POA: Insufficient documentation

## 2018-03-17 DIAGNOSIS — Z9229 Personal history of other drug therapy: Secondary | ICD-10-CM

## 2018-03-17 NOTE — Patient Instructions (Addendum)
1) a chest x-ray has been ordered to evaluate your cough.  2) your breathing test was normal. Suspect the cough is due to postnasal drip and reflux. Allergies may play a role.  3) meant to continue Singulair. If Claritin continues to be ineffective consider Allegra over-the-counter.  4) for reflux continue to avoid going to bed for at least 2 to 3 hours after your last meal of the day. Consider use of Pepcid 20 mg over-the-counter at nighttime.  5) see you in follow-up in 4 to 6 weeks time. Call sooner if symptoms worsen in the interim.

## 2018-03-17 NOTE — Progress Notes (Signed)
Subjective:    Patient ID: Connie Mitchell, female    DOB: 1934/05/07, 84 y.o.   MRN: [id number]  HPI patient is a delightful 84 year old lifelong never smoker referred for the issue of cough of three years duration. She is kindly referred by Dr. Halina Maidens. The patient notes that her cough started approximately three years ago and at the same time she noted that she had significant postnasal drip issues. She describes her postnasal drip as [address] she states that this is constant and that she has to have continual throat clearing. The cough is usually nonproductive but occasionally productive of small unwanted amounts of white clear to yellowish sputum. No purulent sputum production. No hemoptysis. She has never seen in allergist. She noted that initially her problem exhibited seasonal variation but now has become mostly chronic. She also has difficulties with gastroesophageal reflux for which she has tried omeprazole in the past and has been ineffective. He states that she knows take papaya to control this. The patient notes that her cough is worse in the mornings upon arising. She wakes up in the middle of the night to go to the bathroom once she gets up she will have issues with cough. She does describe globus sensation and chest discomfort which initially started in the upper chest and now is mostly in the epigastric area. She has not had any imaging locally with regards to her cough. No pulmonary function testing. Symptom has become more bothersome to her and that is why she is seeking consultation.  As noted, the patient is a lifelong never smoker. She has had several employments to include opera singing, house painting and woodworking. Continues to engage in her hobby of woodworking working mostly with pine she does not use redwoods. She states that she uses a mask when she does this. There are no hot tubs in the home. She has two dogs (labradors) and a short hair cat as pets. They do come  into the home. No birds. No exotic pets.  Family history, past medical history and surgical history have been reviewed.  She has resided "all over the country" but never out [place]. Most recently she had been in [place], [place] and [place]. She has resided in [place] for approximately one year.  Review of Systems  Constitutional: Negative.   HENT: Positive for congestion, postnasal drip and sinus pressure. Negative for dental problem, ear discharge, ear pain and trouble swallowing.        Constant throat clearing. Globus sensation. Feels that her ears are "full".  Eyes: Negative.   Respiratory: Positive for cough, chest tightness and wheezing. Negative for shortness of breath and stridor.        Upper airway "wheeze" associated with copious ascent when she has copious postnasal drip.  Cardiovascular: Negative for chest pain, palpitations and leg swelling.  Gastrointestinal:       Gastroesophageal reflux symptoms, chronic. Otherwise negative.  Endocrine: Negative.   Genitourinary: Negative.   Musculoskeletal: Negative.   Skin: Negative.   Allergic/Immunologic: Positive for environmental allergies. Negative for food allergies and immunocompromised state.  Neurological: Negative.   Hematological: Negative.   Psychiatric/Behavioral: Negative.        Objective:   Physical Exam  Constitutional: She is oriented to person, place, and time. She appears well-developed and well-nourished.  HENT:  Head: Normocephalic and atraumatic.  Right Ear: External ear normal.  Left Ear: External ear normal.  She has serious otitis bilaterally. Turbinates edema and clear nasal discharge. Clear  postnasal drip noted on examination of the oropharynx. Airway Mallampati class I.  Eyes: Pupils are equal, round, and reactive to light. Conjunctivae and EOM are normal. No scleral icterus.  Neck: Normal range of motion. Neck supple. No JVD present. No tracheal deviation present. No thyromegaly present.    Cardiovascular: Normal rate, regular rhythm, normal heart sounds and intact distal pulses. Exam reveals no gallop and no friction rub.  No murmur heard. Pulmonary/Chest: Effort normal and breath sounds normal. No stridor. No respiratory distress. She has no wheezes. She has no rales. She exhibits no tenderness.  She is status post left mastectomy.  Abdominal: Soft. Bowel sounds are normal. She exhibits no distension.  Musculoskeletal: Normal range of motion. She exhibits no edema or tenderness.  Osteoarthritis changes in the hands. He has kyphosis noted.  Lymphadenopathy:    She has no cervical adenopathy.  Neurological: She is alert and oriented to person, place, and time. No cranial nerve deficit.  Skin: Skin is warm and dry. No rash noted. No erythema.  Psychiatric: She has a normal mood and affect. Her behavior is normal. Judgment and thought content normal.  Nursing note and vitals reviewed.  Spirometry was performed and was normal with no evidence of airway obstruction or small airway involvement.      Assessment & Plan:   1)  Cough: patient has had symptoms present for approximately three years. This may be hard to abolish as there is a level of habituation with this symptom. She is a lifelong never smoker, her constellation of symptoms suggests that her cough is likely related to postnasal drip (upper airway cough syndrome) and laryngo reflux associated with gastroesophageal reflux. She has not had recent imaging so we will obtain a chest x-ray to evaluate for potential structural causes. Spirometry today was normal, at this point I cannot make a case for cough variant asthma.  2) Laryngopharyngeal reflux: recommend continuing anti-reflux measures. We discuss these with the patient. Also recommended trial of Pepcid at bedtime. His symptoms persist she may need evaluation by G.I. for reflux issues.  3) Postnasal drip: this is the main culprit with regards to her cough. She is  currently on Singulair and Claritin. I have recommended considering switching to Allegra and continuing Singulair. Additionally as steroid nasal spray may be tried. We will review and evaluate revisit this upon follow-up.  4) She is up-to-date on flu vaccine.  We will see the patient follow-up in 4 to 6 weeks time, she is to contact us prior to that time should any new difficulties arise with regards to her cough symptoms.  I am grateful for the opportunity to participate in this patient's care.

## 2018-04-19 ENCOUNTER — Ambulatory Visit (INDEPENDENT_AMBULATORY_CARE_PROVIDER_SITE_OTHER): Payer: Medicare Other | Admitting: Pulmonary Disease

## 2018-04-19 ENCOUNTER — Encounter: Payer: Self-pay | Admitting: Pulmonary Disease

## 2018-04-19 ENCOUNTER — Telehealth: Payer: Self-pay | Admitting: Pulmonary Disease

## 2018-04-19 VITALS — BP 128/74 | HR 80 | Ht 63.0 in | Wt 175.6 lb

## 2018-04-19 DIAGNOSIS — J3089 Other allergic rhinitis: Secondary | ICD-10-CM

## 2018-04-19 DIAGNOSIS — J302 Other seasonal allergic rhinitis: Secondary | ICD-10-CM | POA: Diagnosis not present

## 2018-04-19 DIAGNOSIS — R0982 Postnasal drip: Secondary | ICD-10-CM | POA: Diagnosis not present

## 2018-04-19 DIAGNOSIS — K219 Gastro-esophageal reflux disease without esophagitis: Secondary | ICD-10-CM

## 2018-04-19 DIAGNOSIS — R059 Cough, unspecified: Secondary | ICD-10-CM

## 2018-04-19 DIAGNOSIS — R05 Cough: Secondary | ICD-10-CM | POA: Diagnosis not present

## 2018-04-19 MED ORDER — AZELASTINE-FLUTICASONE 137-50 MCG/ACT NA SUSP: 1.0000 | Freq: Two times a day (BID) | NASAL | 3 refills | Status: DC

## 2018-04-19 MED ORDER — AZELASTINE HCL 0.1 % NA SOLN: 1.0000 | Freq: Two times a day (BID) | NASAL | 5 refills | Status: DC

## 2018-04-19 NOTE — Progress Notes (Signed)
   Subjective:    Patient ID: Connie Mitchell, female    DOB: [birth year], 84 y.o.   MRN: [id number]  HPI    Review of Systems     Objective:   Physical Exam        Assessment & Plan:

## 2018-04-19 NOTE — Patient Instructions (Signed)
1) we will schedule you for a CT of the chest to evaluate your persistent cough.

## 2018-04-19 NOTE — Telephone Encounter (Signed)
Spoke with Dr. Shirlee Limerick states to send Rx for Azelastine NS only for patient. RX sent to pharmacy in place of [place].

## 2018-04-19 NOTE — Progress Notes (Signed)
Subjective:    Patient ID: Connie Mitchell, female    DOB: February 17, 1934, 84 y.o.   MRN: [id number]  HPI  The patient is an 84 year old lifelong never smoker who follows for the issue of cough of three years duration.The patient notes that her cough started approximately three years ago and at the same time she noted that she had significant postnasal drip issues. She describes her postnasal drip as [address] she states that this is constant and that she has to have continual throat clearing. The cough is usually nonproductive but occasionally productive of small unwanted amounts of white clear to yellowish sputum. No purulent sputum production. No hemoptysis. She has never seen in allergist. She noted that initially her problem exhibited seasonal variation but now has become mostly chronic. She also has difficulties with gastroesophageal reflux for which she has tried omeprazole in the past and has been ineffective. He states that she knows take papaya to control this. The patient notes that her cough is worse in the mornings upon arising. She wakes up in the middle of the night to go to the bathroom once she gets up she will have issues with cough. She does describe globus sensation and chest discomfort which initially started in the upper chest and now is mostly in the epigastric area.  She had a chest x-ray on 3 October that was independently reviewed. There is some minimal. Bronchial coughing perhaps some mild increase in interstitial markings.  Review of Systems  Constitutional: Negative.   HENT: Positive for congestion, postnasal drip and rhinorrhea.   Eyes: Positive for itching.  Respiratory: Positive for cough.   Cardiovascular: Negative.   Gastrointestinal:       She has issues with reflux and heartburn.  Endocrine: Negative.   Genitourinary: Negative.   Musculoskeletal: Negative.   Skin: Negative.   Allergic/Immunologic: Positive for environmental allergies.  All other systems  reviewed and are negative.      Objective:   Physical Exam  Constitutional: She is oriented to person, place, and time. She appears well-developed and well-nourished.  HENT:  Head: Normocephalic and atraumatic.  She has serous otitis bilaterally. Turbinate edema and clear nasal discharge bilaterally. Clear postnasal drip noted on examination of the oropharynx. Airway Mallampati class I.  Eyes: Pupils are equal, round, and reactive to light. Conjunctivae are normal. No scleral icterus.  Neck: Normal range of motion. Neck supple. No JVD present. No tracheal deviation present.  Cardiovascular: Normal rate, regular rhythm, normal heart sounds and intact distal pulses.  Pulmonary/Chest: Effort normal and breath sounds normal.No respiratory distress. She has no wheezes. She has no rales. She exhibits no tenderness.  She is status post left mastectomy.  Abdominal: Soft. She exhibits no distension.  Musculoskeletal: Normal range of motion. She exhibits no edema or tenderness.  Osteoarthritis changes in the hands. He has kyphosis noted.  Lymphadenopathy:    She has no cervical adenopathy.  Neurological: She is alert and oriented to person, place, and time. No cranial nerve deficit.  Skin: Skin is warm and dry. No rash noted. No erythema.  Psychiatric: She has a normal mood and affect. Her behavior is normal. Judgment and thought content normal.  Nursing note and vitals reviewed.      Assessment & Plan:  1)  Cough: patient has had symptoms present for approximately three years. This may be hard to abolish as there is a level of habituation with this symptom. She is a lifelong never smoker, her constellation of symptoms suggests  that her cough is likely related to postnasal drip (upper airway cough syndrome) and laryngopharyngeal reflux associated with gastroesophageal reflux. Chest x-ray performed on 3 October was independently reviewed, there is suggestion of chronic bronchitis and perhaps some  mild increased interstitial changes. Will obtain a CT scan of the chest to further evaluate these findings.  2) Laryngopharyngeal reflux:  I suspect that this is the main cause of problems. She is not following strict anti-reflux measures. She has not elevated the head of her bed. She has had issues previously with esophageal strictures and has been told she has a hiatal hernia but has not followed up with G.I. Currently she is not using Pepcid as recommended during her last visit.  3) Postnasal drip: this is the main culprit with regards to her cough. She is currently on Singulair and Claritin. I have recommended considering switching to Allegra and continuing Singulair. We will give her a trial of Dymista one inhalation twice a day to each nostril.   We will see her in follow-up in 3 to 4 weeks time she is to contact us prior to that time should any new difficulties arise.

## 2018-05-10 ENCOUNTER — Ambulatory Visit
Admission: RE | Admit: 2018-05-10 | Discharge: 2018-05-10 | Disposition: A | Discharge status: Home / Self Care | Payer: Medicare Other | Source: Ambulatory Visit | Attending: Pulmonary Disease | Admitting: Pulmonary Disease

## 2018-05-10 DIAGNOSIS — R05 Cough: Secondary | ICD-10-CM | POA: Diagnosis not present

## 2018-05-10 DIAGNOSIS — J479 Bronchiectasis, uncomplicated: Secondary | ICD-10-CM | POA: Diagnosis not present

## 2018-05-10 DIAGNOSIS — R059 Cough, unspecified: Secondary | ICD-10-CM

## 2018-05-20 ENCOUNTER — Ambulatory Visit (INDEPENDENT_AMBULATORY_CARE_PROVIDER_SITE_OTHER): Payer: Medicare Other | Admitting: Pulmonary Disease

## 2018-05-20 ENCOUNTER — Encounter: Payer: Self-pay | Admitting: Pulmonary Disease

## 2018-05-20 VITALS — BP 118/68 | HR 59 | Ht 63.0 in | Wt 179.2 lb

## 2018-05-20 DIAGNOSIS — J302 Other seasonal allergic rhinitis: Secondary | ICD-10-CM | POA: Diagnosis not present

## 2018-05-20 DIAGNOSIS — R05 Cough: Secondary | ICD-10-CM | POA: Diagnosis not present

## 2018-05-20 DIAGNOSIS — R059 Cough, unspecified: Secondary | ICD-10-CM

## 2018-05-20 DIAGNOSIS — J3089 Other allergic rhinitis: Secondary | ICD-10-CM

## 2018-05-20 DIAGNOSIS — R0600 Dyspnea, unspecified: Secondary | ICD-10-CM | POA: Diagnosis not present

## 2018-05-20 DIAGNOSIS — K219 Gastro-esophageal reflux disease without esophagitis: Secondary | ICD-10-CM

## 2018-05-20 NOTE — Progress Notes (Signed)
Subjective:    Patient ID: Connie Mitchell, female    DOB: [birth year], 84 y.o.   MRN: [id number]  HPI Patient is an 84 year old lifelong never smoker who follows here for the issue of cough.  Cough started approximately 3 years ago associated with significant postnasal drip.  Initially this had seasonal variation but now has become perennial.  She continues to have issues with globus sensation and continual throat clearing and at times episodes of "choking".  She had been on omeprazole for gastroesophageal reflux but deemed it ineffective (however did not use for a significant amount of time) and states that she controls gastroesophageal reflux symptoms with papaya.  Previously she had had a chest x-ray on 3 October that showed bronchial cuffing and some mild increase in interstitial markings.  The patient had a CT scan of the chest performed on 27 November that showed some mild bronchiectasis and thickening of the airways as well as calcified granuloma in the anterior left upper lobe.  May be related to some of her allergic issues and potentially to allergic bronchitis.  He has not exhibited airway obstruction on prior spirometry.  CT also showed a potential 1.3 cm soft tissue mass on the left breast (this is the same breast patient has had cancer previously) and potential aortic stenosis and an ascending thoracic aortic aneurysm.  Patient has been scheduled for a mammogram by her primary care physician, Dr. Army Melia.  Patient states that her cough has been about the same will for the most part nonproductive.  She does describe that her choking spells continue to plague her.  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  She has not had any fevers, chills or sweats.  Previously she had noted some "wheezing" however she does not endorse this today.  She does admit today that she does get fatigued easily and has noted some dyspnea wonders if this is related to the findings on CT scan noted in her  heart.   The patient does tell me today that she keeps numerous cats in the home as well as numerous dogs.  Query again, whether allergies are triggering her issues.   Review of Systems  Constitutional: Positive for fatigue.  HENT: Positive for postnasal drip and rhinorrhea.   Eyes: Negative.   Respiratory: Positive for cough, choking and shortness of breath.   Cardiovascular: Negative.   Gastrointestinal: Negative.   Endocrine: Negative.   Genitourinary: Negative.   Musculoskeletal: Negative.   Skin: Negative.   Allergic/Immunologic: Negative.   Neurological: Negative.   Hematological: Negative.   Psychiatric/Behavioral: Negative.   All other systems reviewed and are negative.      Objective:   Physical Exam  Constitutional: She isoriented to person, place, and time. She appears well-nourished.  HENT:  Head:Normocephalicand atraumatic.  Clear postnasal drip noted on examination of the oropharynx. Airway Mallampati class I. Eyes:Pupils are equal, round, and reactive to light.Conjunctivae are normal. No scleral icterus.  Neck:Normal range of motion.Neck supple.No JVDpresent. No tracheal deviationpresent.  Cardiovascular:Normal rate,regular rhythm,normal heart soundsand intact distal pulses.  Pulmonary/Chest:Effort normaland breath sounds normal.Norespiratory distress. She hasno wheezes. She hasno rales. She exhibitsno tenderness. Abdominal:Soft. She exhibitsno distension.  Musculoskeletal:Normal range of motion. She exhibits noedemaor tenderness. Osteoarthritis changes in the hands. He has kyphosis noted. Lymphadenopathy:  She has no cervical adenopathy.  Neurological: She isalertand oriented to person, place, and time. Nocranial nerve deficit.  Skin: Skin iswarmand dry.No rashnoted. No erythema.  Psychiatric: She has anormal mood and  affect. Herbehavior is normal.Judgmentand thought contentnormal.  Nursing noteand  vitalsreviewed.      Assessment & Plan:   1.  Dyspnea/fatigue: Patient has abnormalities noted on CT scan of the chest that potentially indicate that she may have aortic stenosis.  This could explain her symptoms.  She does not have an overt murmur but when aortic stenosis progresses murmur may disappear due to severity.  2D echo will be obtained to evaluate this issue.  This may also be playing a role on her cough symptoms.  2.  Perennial allergic rhinitis with postnasal drip: She does endorse significant allergen exposure.  We will proceed with obtaining IgE and RAST.  I have advised her that symptom control is with control of exposure to allergens.  He is somewhat reluctant to help her multiple animals even if this was the cause of her symptoms.  3.  Left breast mass: This was noted on CT scan of the chest, this is on the same side the patient had cancer previously.  She has had lumpectomy on that side.  She states that she actually has noticed a lump but had neglected to tell her primary care physician.  Dr. Army Melia has already ordered a mammogram.    4.  Cough: This is likely related to her exposure to multiple allergens.  She has very mild bronchiectatic changes on her ET but these are very minimal and suspect that this is more peribronchial cuffing due to possible allergic bronchitis.  Will determine further therapy pending IgE and RAST.  She may need an inhaled corticosteroid.  5.  Potential aortic stenosis: 2D echo as above.  6.  Ascending aortic aneurysm: Per radiology semiannual evaluation with CT is recommended.  Consider referral to vascular surgery for close monitoring.   We will see the patient in follow-up in 6 to 8 weeks time she is to contact us prior to that time should any new difficulties arise.

## 2018-05-20 NOTE — Patient Instructions (Signed)
1.  We will check allergy testing the blood test.  2.  Please restart using your omeprazole as some of the choking episodes he described are related to LPR D or laryngopharyngeal reflux related to gastroesophageal reflux.  3.  We are going to order a heart test to evaluate your heart valves as there was some abnormality seen on your CT scan.  4.  We will see him in follow-up in 6 to 8 weeks time.

## 2018-05-25 DIAGNOSIS — J3089 Other allergic rhinitis: Secondary | ICD-10-CM | POA: Diagnosis not present

## 2018-05-25 DIAGNOSIS — J302 Other seasonal allergic rhinitis: Secondary | ICD-10-CM | POA: Diagnosis not present

## 2018-05-27 ENCOUNTER — Encounter: Payer: Self-pay | Admitting: Internal Medicine

## 2018-05-27 ENCOUNTER — Ambulatory Visit (INDEPENDENT_AMBULATORY_CARE_PROVIDER_SITE_OTHER): Payer: Medicare Other | Admitting: Internal Medicine

## 2018-05-27 VITALS — BP 118/78 | HR 68 | Resp 16 | Ht 63.0 in | Wt 179.0 lb

## 2018-05-27 DIAGNOSIS — N6320 Unspecified lump in the left breast, unspecified quadrant: Secondary | ICD-10-CM | POA: Diagnosis not present

## 2018-05-27 DIAGNOSIS — I7121 Aneurysm of the ascending aorta, without rupture: Secondary | ICD-10-CM | POA: Insufficient documentation

## 2018-05-27 DIAGNOSIS — R05 Cough: Secondary | ICD-10-CM | POA: Diagnosis not present

## 2018-05-27 DIAGNOSIS — I359 Nonrheumatic aortic valve disorder, unspecified: Secondary | ICD-10-CM | POA: Diagnosis not present

## 2018-05-27 DIAGNOSIS — K219 Gastro-esophageal reflux disease without esophagitis: Secondary | ICD-10-CM | POA: Diagnosis not present

## 2018-05-27 DIAGNOSIS — R1032 Left lower quadrant pain: Secondary | ICD-10-CM | POA: Diagnosis not present

## 2018-05-27 DIAGNOSIS — R058 Other specified cough: Secondary | ICD-10-CM

## 2018-05-27 DIAGNOSIS — R1031 Right lower quadrant pain: Secondary | ICD-10-CM

## 2018-05-27 DIAGNOSIS — R030 Elevated blood-pressure reading, without diagnosis of hypertension: Secondary | ICD-10-CM

## 2018-05-27 DIAGNOSIS — I712 Thoracic aortic aneurysm, without rupture: Secondary | ICD-10-CM | POA: Insufficient documentation

## 2018-05-27 LAB — POCT URINALYSIS DIPSTICK
BILIRUBIN UA: NEGATIVE
GLUCOSE UA: NEGATIVE
KETONES UA: NEGATIVE
Nitrite, UA: NEGATIVE
PH UA: 7 (ref 5.0–8.0)
Protein, UA: NEGATIVE
Spec Grav, UA: 1.01 (ref 1.010–1.025)
Urobilinogen, UA: 0.2 E.U./dL

## 2018-05-27 NOTE — Progress Notes (Signed)
Date:  05/27/2018   Name:  Connie Mitchell   DOB:  [birth year]   MRN:  [id number]   Chief Complaint: Annual Exam (mass found on CT 1/2 weeks ago per Dr. Duwayne Heck also had labs and echo. see pulm. notes )  Gastroesophageal Reflux  She complains of abdominal pain (lower abd pains, mild, intermittent), coughing and heartburn. She reports no chest pain or no wheezing. Pertinent negatives include no fatigue. She has tried a PPI for the symptoms.  Cough  This is a chronic problem. The problem occurs every few minutes (seen by Pulmonary - CT showed aneurysm and breast mass). The cough is non-productive. Associated symptoms include heartburn. Pertinent negatives include no chest pain, chills, fever, headaches, rash, shortness of breath or wheezing. She has tried leukotriene antagonists (and claritin) for the symptoms. Her past medical history is significant for environmental allergies.  Breast mass - pt has hx of breast cancer. S/p left lumpectomy1991. She noticed a fullness on the lateral breast about 3 weeks ago.  CT Chest:  IMPRESSION: No active cardiopulmonary disease.  Maximal diameter of the ascending aorta is 4.6 cm. There are also calcifications in the aortic valve. These findings may indicate aortic valve stenosis. Consider echocardiogram. Ascending thoracic aortic aneurysm. Recommend semi-annual imaging followup by CTA or MRA and referral to cardiothoracic surgery if not already obtained. This recommendation follows 2010 ACCF/AHA/AATS/ACR/ASA/SCA/SCAI/SIR/STS/SVM Guidelines for the Diagnosis and Management of Patients With Thoracic Aortic Disease. Circulation. 2010; 121: [id number]  1.3 cm left breast mass. Malignancy is not excluded. Mammogram is recommended.  Review of Systems  Constitutional: Negative for chills, fatigue, fever and unexpected weight change.  Eyes: Negative for visual disturbance.  Respiratory: Positive for cough. Negative for chest tightness, shortness of  breath and wheezing.   Cardiovascular: Negative for chest pain, palpitations and leg swelling.  Gastrointestinal: Positive for abdominal pain (lower abd pains, mild, intermittent) and heartburn.       Heart burn  Endocrine: Negative for polyuria.  Genitourinary: Negative for dysuria, frequency and hematuria.  Musculoskeletal: Positive for arthralgias (heel of left foot) and back pain. Negative for gait problem and joint swelling.  Skin: Negative for color change and rash.  Allergic/Immunologic: Positive for environmental allergies.  Neurological: Negative for dizziness, light-headedness and headaches.  Hematological: Negative for adenopathy.  Psychiatric/Behavioral: Negative for dysphoric mood and sleep disturbance. The patient is not nervous/anxious.     Patient Active Problem List   Diagnosis Date Noted  . Thoracic ascending aortic aneurysm ([place]) 05/27/2018  . LPRD (laryngopharyngeal reflux disease) 03/17/2018  . Post-nasal drip 03/17/2018  . Allergic cough 11/25/2017  . Arthritis involving multiple sites 11/25/2017  . Chronic foot pain 11/25/2017  . Lumbar degenerative disc disease 09/24/2017  . Hx of Lyme disease 09/24/2017  . Hx of basal cell carcinoma 09/24/2017  . Elevated blood pressure reading 09/24/2017  . Nuclear sclerotic cataract of left eye 08/09/2015  . Osteoporosis 07/04/2014  . Primary localized osteoarthrosis of shoulder region 12/13/2012  . History of breast cancer 12/18/2011  . Osteoarthritis 12/18/2011    Allergies  Allergen Reactions  . Ciprofloxacin Nausea And Vomiting    Past Surgical History:  Procedure Laterality Date  . cataracts Bilateral   . CYSTOCELE REPAIR  2010  . MASTECTOMY PARTIAL / LUMPECTOMY Left 1991   cancer  . OOPHORECTOMY  1995   ovarian mass - benign  . REPLACEMENT TOTAL KNEE Bilateral   . TONSILLECTOMY  1958  . TOTAL SHOULDER ARTHROPLASTY Right     Social History  Tobacco Use  . Smoking status: Never Smoker  . Smokeless  tobacco: Never Used  . Tobacco comment: smoking cessation materials not required  Substance Use Topics  . Alcohol use: Yes    Comment: occasional  . Drug use: Never     Medication list has been reviewed and updated.  Current Meds  Medication Sig  . Ibuprofen (ADVIL) 200 MG CAPS 2 capsules as needed.   . montelukast (SINGULAIR) 10 MG tablet Take 1 tablet (10 mg total) by mouth at bedtime.  Marland Kitchen omeprazole (PRILOSEC) 40 MG capsule Take 1 capsule by mouth daily.  . Papaya Enzyme CHEW 3 (three) times daily with meals.   . [DISCONTINUED] loratadine (CLARITIN REDITABS) 10 MG dissolvable tablet Take 10 mg by mouth daily.    PHQ 2/9 Scores 05/27/2018 01/21/2018 11/17/2017 09/24/2017  PHQ - 2 Score 0 0 0 0  PHQ- 9 Score - 0 0 4    Physical Exam Vitals signs and nursing note reviewed.  Constitutional:      General: She is not in acute distress.    Appearance: She is well-developed.  HENT:     Head: Normocephalic and atraumatic.     Right Ear: Tympanic membrane and ear canal normal.     Left Ear: Tympanic membrane and ear canal normal.     Nose:     Right Sinus: No maxillary sinus tenderness.     Left Sinus: No maxillary sinus tenderness.     Mouth/Throat:     Pharynx: Uvula midline.  Eyes:     General: No scleral icterus.       Right eye: No discharge.        Left eye: No discharge.     Conjunctiva/sclera: Conjunctivae normal.  Neck:     Musculoskeletal: Normal range of motion. No erythema.     Thyroid: No thyromegaly.     Vascular: No carotid bruit.  Cardiovascular:     Rate and Rhythm: Normal rate and regular rhythm.     Pulses: Normal pulses.     Heart sounds: Normal heart sounds.  Pulmonary:     Effort: Pulmonary effort is normal. No respiratory distress.     Breath sounds: No wheezing.  Chest:     Breasts:        Right: No mass, nipple discharge, skin change or tenderness.        Left: Inverted nipple, mass and tenderness present. No nipple discharge or skin change.     Abdominal:     General: Bowel sounds are normal. There is no distension.     Palpations: Abdomen is soft. There is no mass.     Tenderness: There is no abdominal tenderness. There is no rebound.     Hernia: No hernia is present.  Musculoskeletal: Normal range of motion.     Comments: Tender over the posterior left heel - no swelling or redness  Lymphadenopathy:     Cervical: No cervical adenopathy.  Skin:    General: Skin is warm and dry.     Findings: No rash.  Neurological:     Mental Status: She is alert and oriented to person, place, and time.     Cranial Nerves: No cranial nerve deficit.     Sensory: No sensory deficit.     Deep Tendon Reflexes: Reflexes are normal and symmetric.  Psychiatric:        Attention and Perception: Attention normal.        Mood and Affect: Mood normal.  Speech: Speech normal.        Behavior: Behavior normal.        Thought Content: Thought content normal.     BP 118/78   Pulse 68   Resp 16   Ht 5\' 3"  (1.6 m)   Wt 179 lb (81.2 kg)   SpO2 97%   BMI 31.71 kg/m   Assessment and Plan: 1. LPRD (laryngopharyngeal reflux disease) Continue PPI  2. Allergic cough Continue singulair and pulmonary follow up Recently had serum allergy testing - CBC with Differential/Platelet  3. Thoracic ascending aortic aneurysm ([place]) Return in 6 mo for follow up and repeat imaging - Lipid panel  4. Aortic valve calcification ECHO ordered by Pulmonary MD  5. Left breast mass - MM DIAG BREAST TOMO BILATERAL; Future - US BREAST LTD UNI LEFT INC AXILLA; Future - US BREAST LTD UNI RIGHT INC AXILLA; Future  6. Elevated blood pressure reading Controlled today - Comprehensive metabolic panel  7. Bilateral lower abdominal discomfort UA showed only 1-2 RBC/hpf, otherwise clear - POCT urinalysis dipstick   Partially dictated using Editor, commissioning. Any errors are unintentional.  Halina Maidens, MD [facility]  05/27/2018

## 2018-05-28 LAB — COMPREHENSIVE METABOLIC PANEL
A/G RATIO: 1.6 (ref 1.2–2.2)
ALT: 20 IU/L (ref 0–32)
AST: 22 IU/L (ref 0–40)
Albumin: 4.3 g/dL (ref 3.5–4.7)
Alkaline Phosphatase: 78 IU/L (ref 39–117)
BUN/Creatinine Ratio: 19 (ref 12–28)
BUN: 13 mg/dL (ref 8–27)
Bilirubin Total: 0.6 mg/dL (ref 0.0–1.2)
CALCIUM: 9.6 mg/dL (ref 8.7–10.3)
CO2: 24 mmol/L (ref 20–29)
Chloride: 105 mmol/L (ref 96–106)
Creatinine, Ser: 0.7 mg/dL (ref 0.57–1.00)
GFR calc Af Amer: 93 mL/min/{1.73_m2} (ref >59)
GFR, EST NON AFRICAN AMERICAN: 81 mL/min/{1.73_m2} (ref >59)
Globulin, Total: 2.7 g/dL (ref 1.5–4.5)
Glucose: 80 mg/dL (ref 65–99)
POTASSIUM: 4.4 mmol/L (ref 3.5–5.2)
Sodium: 144 mmol/L (ref 134–144)
Total Protein: 7 g/dL (ref 6.0–8.5)

## 2018-05-28 LAB — ALLERGENS W/TOTAL IGE AREA 2
Aspergillus Fumigatus IgE: <0.1 kU/L
Bermuda Grass IgE: <0.1 kU/L
Cat Dander IgE: <0.1 kU/L
Cedar, Mountain IgE: <0.1 kU/L
Cladosporium Herbarum IgE: <0.1 kU/L
Cockroach, German IgE: <0.1 kU/L
Cottonwood IgE: <0.1 kU/L
D Farinae IgE: <0.1 kU/L
D Pteronyssinus IgE: <0.1 kU/L
Dog Dander IgE: <0.1 kU/L
Elm, American IgE: <0.1 kU/L
IgE (Immunoglobulin E), Serum: 84 IU/mL (ref 6–495)
Mouse Urine IgE: <0.1 kU/L
Ragweed, Short IgE: <0.1 kU/L
White Mulberry IgE: <0.1 kU/L

## 2018-05-28 LAB — CBC WITH DIFFERENTIAL/PLATELET
BASOS ABS: 0.1 10*3/uL (ref 0.0–0.2)
Basos: 1 %
EOS (ABSOLUTE): 0.2 10*3/uL (ref 0.0–0.4)
Eos: 3 %
Hematocrit: 45 % (ref 34.0–46.6)
Hemoglobin: 14.8 g/dL (ref 11.1–15.9)
IMMATURE GRANS (ABS): 0 10*3/uL (ref 0.0–0.1)
Immature Granulocytes: 0 %
LYMPHS: 23 %
Lymphocytes Absolute: 1.5 10*3/uL (ref 0.7–3.1)
MCH: 30.1 pg (ref 26.6–33.0)
MCHC: 32.9 g/dL (ref 31.5–35.7)
MCV: 92 fL (ref 79–97)
Monocytes Absolute: 0.5 10*3/uL (ref 0.1–0.9)
Monocytes: 7 %
Neutrophils Absolute: 4.4 10*3/uL (ref 1.4–7.0)
Neutrophils: 66 %
PLATELETS: 89 10*3/uL — AB (ref 150–450)
RBC: 4.92 x10E6/uL (ref 3.77–5.28)
RDW: 13.6 % (ref 12.3–15.4)
WBC: 6.7 10*3/uL (ref 3.4–10.8)

## 2018-05-28 LAB — LIPID PANEL
CHOL/HDL RATIO: 2.7 ratio (ref 0.0–4.4)
Cholesterol, Total: 181 mg/dL (ref 100–199)
HDL: 66 mg/dL (ref >39)
LDL Calculated: 98 mg/dL (ref 0–99)
TRIGLYCERIDES: 86 mg/dL (ref 0–149)
VLDL Cholesterol Cal: 17 mg/dL (ref 5–40)

## 2018-05-30 ENCOUNTER — Encounter: Payer: Self-pay | Admitting: Internal Medicine

## 2018-05-30 ENCOUNTER — Other Ambulatory Visit: Payer: Self-pay

## 2018-05-30 DIAGNOSIS — R7989 Other specified abnormal findings of blood chemistry: Secondary | ICD-10-CM

## 2018-05-31 DIAGNOSIS — R7989 Other specified abnormal findings of blood chemistry: Secondary | ICD-10-CM | POA: Diagnosis not present

## 2018-06-01 LAB — CBC WITH DIFFERENTIAL/PLATELET
BASOS ABS: 0.1 10*3/uL (ref 0.0–0.2)
BASOS: 1 %
EOS (ABSOLUTE): 0.2 10*3/uL (ref 0.0–0.4)
Eos: 4 %
Hematocrit: 43.9 % (ref 34.0–46.6)
Hemoglobin: 14.4 g/dL (ref 11.1–15.9)
IMMATURE GRANS (ABS): 0 10*3/uL (ref 0.0–0.1)
IMMATURE GRANULOCYTES: 0 %
LYMPHS: 26 %
Lymphocytes Absolute: 1.8 10*3/uL (ref 0.7–3.1)
MCH: 30.1 pg (ref 26.6–33.0)
MCHC: 32.8 g/dL (ref 31.5–35.7)
MCV: 92 fL (ref 79–97)
MONOCYTES: 9 %
Monocytes Absolute: 0.6 10*3/uL (ref 0.1–0.9)
NEUTROS ABS: 4.2 10*3/uL (ref 1.4–7.0)
NEUTROS PCT: 60 %
RBC: 4.78 x10E6/uL (ref 3.77–5.28)
RDW: 13.2 % (ref 12.3–15.4)
WBC: 6.9 10*3/uL (ref 3.4–10.8)

## 2018-06-02 ENCOUNTER — Other Ambulatory Visit: Payer: Self-pay | Admitting: Internal Medicine

## 2018-06-02 DIAGNOSIS — R05 Cough: Secondary | ICD-10-CM

## 2018-06-02 DIAGNOSIS — R058 Other specified cough: Secondary | ICD-10-CM

## 2018-06-20 ENCOUNTER — Ambulatory Visit
Admission: RE | Admit: 2018-06-20 | Discharge: 2018-06-20 | Disposition: A | Discharge status: Home / Self Care | Payer: Medicare Other | Source: Ambulatory Visit | Attending: Pulmonary Disease | Admitting: Pulmonary Disease

## 2018-06-20 DIAGNOSIS — K219 Gastro-esophageal reflux disease without esophagitis: Secondary | ICD-10-CM | POA: Insufficient documentation

## 2018-06-20 DIAGNOSIS — R0600 Dyspnea, unspecified: Secondary | ICD-10-CM | POA: Insufficient documentation

## 2018-06-20 NOTE — Progress Notes (Signed)
*  PRELIMINARY RESULTS* Echocardiogram 2D Echocardiogram has been performed.  Connie Mitchell 06/20/2018, 10:35 AM

## 2018-06-29 ENCOUNTER — Ambulatory Visit
Admission: RE | Admit: 2018-06-29 | Discharge: 2018-06-29 | Disposition: A | Discharge status: Home / Self Care | Payer: Medicare Other | Source: Ambulatory Visit | Attending: Internal Medicine | Admitting: Internal Medicine

## 2018-06-29 DIAGNOSIS — N6320 Unspecified lump in the left breast, unspecified quadrant: Secondary | ICD-10-CM

## 2018-06-29 DIAGNOSIS — N6321 Unspecified lump in the left breast, upper outer quadrant: Secondary | ICD-10-CM | POA: Insufficient documentation

## 2018-06-29 DIAGNOSIS — R928 Other abnormal and inconclusive findings on diagnostic imaging of breast: Secondary | ICD-10-CM | POA: Diagnosis not present

## 2018-06-29 DIAGNOSIS — Z853 Personal history of malignant neoplasm of breast: Secondary | ICD-10-CM | POA: Diagnosis not present

## 2018-06-29 HISTORY — DX: Personal history of irradiation: Z92.3

## 2018-06-30 ENCOUNTER — Other Ambulatory Visit: Payer: Self-pay | Admitting: Internal Medicine

## 2018-06-30 DIAGNOSIS — N6320 Unspecified lump in the left breast, unspecified quadrant: Secondary | ICD-10-CM

## 2018-06-30 DIAGNOSIS — R928 Other abnormal and inconclusive findings on diagnostic imaging of breast: Secondary | ICD-10-CM

## 2018-07-01 ENCOUNTER — Ambulatory Visit
Admission: RE | Admit: 2018-07-01 | Discharge: 2018-07-01 | Disposition: A | Discharge status: Home / Self Care | Payer: Medicare Other | Source: Ambulatory Visit | Attending: Internal Medicine | Admitting: Internal Medicine

## 2018-07-01 DIAGNOSIS — R928 Other abnormal and inconclusive findings on diagnostic imaging of breast: Secondary | ICD-10-CM

## 2018-07-01 DIAGNOSIS — C50412 Malignant neoplasm of upper-outer quadrant of left female breast: Secondary | ICD-10-CM | POA: Diagnosis not present

## 2018-07-01 DIAGNOSIS — N6320 Unspecified lump in the left breast, unspecified quadrant: Secondary | ICD-10-CM | POA: Diagnosis not present

## 2018-07-01 DIAGNOSIS — N6321 Unspecified lump in the left breast, upper outer quadrant: Secondary | ICD-10-CM | POA: Diagnosis not present

## 2018-07-04 ENCOUNTER — Other Ambulatory Visit: Payer: Self-pay | Admitting: Anatomic Pathology & Clinical Pathology

## 2018-07-05 ENCOUNTER — Encounter: Payer: Self-pay | Admitting: *Deleted

## 2018-07-05 DIAGNOSIS — C50912 Malignant neoplasm of unspecified site of left female breast: Secondary | ICD-10-CM

## 2018-07-05 NOTE — Addendum Note (Signed)
Addended by: Rico Junker on: 07/05/2018 10:26 AM   Modules accepted: Orders

## 2018-07-05 NOTE — Progress Notes (Signed)
Patient returned my call.  She would like to see Dr. Mike Gip in [place].  I have scheduled her to see her on 07/07/18 @ 8:30 and Dr. Hampton Abbot in [place] on 07/12/18 @ 10:30.  Will give educational material at the time of her appointment with Dr. Mike Gip.  She is to call with any questions or needs.

## 2018-07-05 NOTE — Progress Notes (Signed)
  Oncology Nurse Navigator Documentation  Navigator Location: [facility] (07/05/18 0900)   )Navigator Encounter Type: Introductory phone call (07/05/18 0900)   Abnormal Finding Date: 06/29/18 (07/05/18 0900) Confirmed Diagnosis Date: 07/04/18 (07/05/18 0900)                   Barriers/Navigation Needs: Coordination of Care (07/05/18 0900)                          Time Spent with Patient: 15 (07/05/18 0900)   Patient is newly diagnosed with an invasive mammary carcinoma of the left breast.  She has a history of malignancy of the left breast in 1991.  I have left her a message to return my call.  I would like to establish navigation services and offer coordination of care.

## 2018-07-07 ENCOUNTER — Encounter: Payer: Self-pay | Admitting: Hematology and Oncology

## 2018-07-07 ENCOUNTER — Encounter: Payer: Self-pay | Admitting: *Deleted

## 2018-07-07 ENCOUNTER — Other Ambulatory Visit: Payer: Self-pay | Admitting: Anatomic Pathology & Clinical Pathology

## 2018-07-07 ENCOUNTER — Inpatient Hospital Stay: Payer: Medicare Other | Attending: Hematology and Oncology | Admitting: Hematology and Oncology

## 2018-07-07 ENCOUNTER — Ambulatory Visit: Payer: Medicare Other

## 2018-07-07 ENCOUNTER — Other Ambulatory Visit: Payer: Self-pay

## 2018-07-07 VITALS — BP 136/75 | HR 68 | Temp 97.9°F | Resp 18 | Ht 63.0 in | Wt 176.9 lb

## 2018-07-07 DIAGNOSIS — Z9221 Personal history of antineoplastic chemotherapy: Secondary | ICD-10-CM | POA: Insufficient documentation

## 2018-07-07 DIAGNOSIS — M1990 Unspecified osteoarthritis, unspecified site: Secondary | ICD-10-CM | POA: Insufficient documentation

## 2018-07-07 DIAGNOSIS — Z79899 Other long term (current) drug therapy: Secondary | ICD-10-CM | POA: Diagnosis not present

## 2018-07-07 DIAGNOSIS — C50412 Malignant neoplasm of upper-outer quadrant of left female breast: Secondary | ICD-10-CM | POA: Diagnosis not present

## 2018-07-07 DIAGNOSIS — M419 Scoliosis, unspecified: Secondary | ICD-10-CM | POA: Insufficient documentation

## 2018-07-07 DIAGNOSIS — Z923 Personal history of irradiation: Secondary | ICD-10-CM | POA: Diagnosis not present

## 2018-07-07 DIAGNOSIS — Z170 Estrogen receptor positive status [ER+]: Secondary | ICD-10-CM | POA: Diagnosis not present

## 2018-07-07 DIAGNOSIS — M85851 Other specified disorders of bone density and structure, right thigh: Secondary | ICD-10-CM

## 2018-07-07 DIAGNOSIS — R05 Cough: Secondary | ICD-10-CM | POA: Diagnosis not present

## 2018-07-07 DIAGNOSIS — Z853 Personal history of malignant neoplasm of breast: Secondary | ICD-10-CM | POA: Diagnosis not present

## 2018-07-07 DIAGNOSIS — M8580 Other specified disorders of bone density and structure, unspecified site: Secondary | ICD-10-CM | POA: Insufficient documentation

## 2018-07-07 DIAGNOSIS — K219 Gastro-esophageal reflux disease without esophagitis: Secondary | ICD-10-CM | POA: Diagnosis not present

## 2018-07-07 DIAGNOSIS — I10 Essential (primary) hypertension: Secondary | ICD-10-CM | POA: Insufficient documentation

## 2018-07-07 DIAGNOSIS — Z8042 Family history of malignant neoplasm of prostate: Secondary | ICD-10-CM | POA: Diagnosis not present

## 2018-07-07 LAB — CBC WITH DIFFERENTIAL/PLATELET
ABS IMMATURE GRANULOCYTES: 0.02 10*3/uL (ref 0.00–0.07)
BASOS PCT: 1 %
Basophils Absolute: 0.1 10*3/uL (ref 0.0–0.1)
Eosinophils Absolute: 0.2 10*3/uL (ref 0.0–0.5)
Eosinophils Relative: 3 %
HCT: 45.6 % (ref 36.0–46.0)
Hemoglobin: 15.3 g/dL — ABNORMAL HIGH (ref 12.0–15.0)
Immature Granulocytes: 0 %
Lymphocytes Relative: 24 %
Lymphs Abs: 1.6 10*3/uL (ref 0.7–4.0)
MCH: 30.8 pg (ref 26.0–34.0)
MCHC: 33.6 g/dL (ref 30.0–36.0)
MCV: 91.8 fL (ref 80.0–100.0)
Monocytes Absolute: 0.6 10*3/uL (ref 0.1–1.0)
Monocytes Relative: 8 %
NEUTROS ABS: 4.3 10*3/uL (ref 1.7–7.7)
Neutrophils Relative %: 64 %
RBC: 4.97 MIL/uL (ref 3.87–5.11)
RDW: 14.6 % (ref 11.5–15.5)
WBC: 6.7 10*3/uL (ref 4.0–10.5)
nRBC: 0 % (ref 0.0–0.2)

## 2018-07-07 LAB — SURGICAL PATHOLOGY

## 2018-07-07 LAB — PLATELET BY CITRATE: Platelet CT in Citrate: 232

## 2018-07-07 MED ORDER — OMEPRAZOLE 40 MG PO CPDR: 40.0000 mg | DELAYED_RELEASE_CAPSULE | Freq: Every day | ORAL | 1 refills | Status: DC
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
## 2018-07-07 NOTE — Progress Notes (Signed)
  Oncology Nurse Navigator Documentation  Navigator Location: [facility]-Med Onc (07/07/18 1000) Referral date to RadOnc/MedOnc: 07/07/18 (07/07/18 1000) )Navigator Encounter Type: Initial MedOnc (07/07/18 1000)                     Patient Visit Type: MedOnc (07/07/18 1000) Treatment Phase: Pre-Tx/Tx Discussion (07/07/18 1000) Barriers/Navigation Needs: Education (07/07/18 1000) Education: Newly Diagnosed Cancer Education (07/07/18 1000) Interventions: Education (07/07/18 1000)     Education Method: Written (07/07/18 1000)  Support Groups/Services: Breast Support Group (07/07/18 1000)             Time Spent with Patient: 75 (07/07/18 1000)   Met patient during her initial medical oncology consult with Dr. Mike Gip.  Gave patient breast cancer educational literature, "My Breast Cancer Treatment Handbook" by Josephine Igo, RN.  Dr. Mike Gip discussed mastectomy with antihormonal therapy if ER/PR positive.  Patient is to call if she has any questions or needs.

## 2018-07-08 LAB — CANCER ANTIGEN 27.29: CA 27.29: 30.3 U/mL (ref 0.0–38.6)

## 2018-07-12 ENCOUNTER — Other Ambulatory Visit: Payer: Self-pay

## 2018-07-12 ENCOUNTER — Encounter: Payer: Self-pay | Admitting: Surgery

## 2018-07-12 ENCOUNTER — Ambulatory Visit (INDEPENDENT_AMBULATORY_CARE_PROVIDER_SITE_OTHER): Payer: Medicare Other | Admitting: Surgery

## 2018-07-12 VITALS — BP 137/77 | HR 65 | Temp 97.4°F | Resp 18 | Ht 63.0 in | Wt 176.0 lb

## 2018-07-12 DIAGNOSIS — C50412 Malignant neoplasm of upper-outer quadrant of left female breast: Secondary | ICD-10-CM | POA: Diagnosis not present

## 2018-07-12 DIAGNOSIS — Z170 Estrogen receptor positive status [ER+]: Secondary | ICD-10-CM | POA: Diagnosis not present

## 2018-07-12 DIAGNOSIS — M85851 Other specified disorders of bone density and structure, right thigh: Secondary | ICD-10-CM | POA: Insufficient documentation

## 2018-07-12 NOTE — H&P (View-Only) (Signed)
07/12/2018  Reason for Visit:  Left breast cancer  Referring Provider:  Halina Maidens, MD  History of Present Illness: Connie Mitchell is a 85 y.o. female with a prior history of left breast cancer, s/p lumpectomy and radiation in 1991.  She had a chest CT scan on 11/27 due to a history of cough for 4 years, and it had an incidental finding of a new 1.3 cm soft tissue mass in the left breast.  She had a diagnostic mammogram and ultrasound on 06/29/18 which confirmed a lobulated 1.7 cm mass in the upper outer quadrant of the left breast.  Her axilla was negative on ultrasound.  She had a biopsy of the mass which confirmed invasive mammary carcinoma, grade 1.  She presents today for surgical evaluation.  Menarche was at age 30.  Has never been pregnant nor used contraceptives, but used an estrogen patch around menopause for 1 year.  Menopause was at age 85.  Besides her personal history of breast cancer, there is family history of breast cancer in  maternal cousins, as well as history of prostate cancer in her brother.  Her last mammogram was in 2013.  She was not able to get mammogram before this year because she was moving a lot between cities and she was told sometimes that Medicare would not cover it.  She reports that she could feel something in the left breast for the last 3 years.  Denies any skin or nipple changes, denies any nipple drainage, or breast pain.  Past Medical History: Past Medical History:  Diagnosis Date  . GERD (gastroesophageal reflux disease)   . Personal history of radiation therapy 1991   left breast ca  . Primary osteoarthritis      Past Surgical History: Past Surgical History:  Procedure Laterality Date  . BREAST LUMPECTOMY Left 1991   breast ca  . cataracts Bilateral   . CYSTOCELE REPAIR  2010  . OOPHORECTOMY  1995   ovarian mass - benign  . REPLACEMENT TOTAL KNEE Bilateral   . TONSILLECTOMY  1958  . TOTAL SHOULDER ARTHROPLASTY Right     Home  Medications: Prior to Admission medications   Medication Sig Start Date End Date Taking? Authorizing Provider  Ibuprofen (ADVIL) 200 MG CAPS 2 capsules as needed.     [provider]  montelukast (SINGULAIR) 10 MG tablet TAKE 1 TABLET BY MOUTH EVERYDAY AT BEDTIME 06/02/18   Glean Hess, MD  omeprazole (PRILOSEC) 40 MG capsule Take 1 capsule (40 mg total) by mouth daily. 07/07/18   Glean Hess, MD  Papaya Enzyme CHEW 3 (three) times daily with meals.  06/15/16   [provider]    Allergies: Allergies  Allergen Reactions  . Ciprofloxacin Nausea And Vomiting    Social History:  reports that she has never smoked. She has never used smokeless tobacco. She reports current alcohol use. She reports that she does not use drugs.   Family History: Family History  Problem Relation Age of Onset  . Hypertension Mother   . Alzheimer's disease Mother   . Stroke Father   . Cancer Brother        prostate  . Atrial fibrillation Brother   . Cancer Other        breast  . Atrial fibrillation Brother   . Breast cancer Cousin 72       paternal x 2    Review of Systems: Review of Systems  Constitutional: Negative for chills and fever.  HENT:  Negative for hearing loss.   Eyes: Negative for blurred vision.  Respiratory: Negative for shortness of breath.   Cardiovascular: Negative for chest pain.  Gastrointestinal: Negative for abdominal pain, nausea and vomiting.  Genitourinary: Negative for dysuria.  Musculoskeletal: Positive for back pain. Negative for myalgias.  Skin: Negative for rash.  Neurological: Negative for dizziness.  Psychiatric/Behavioral: Negative for depression.    Physical Exam BP 137/77   Pulse 65   Temp (!) 97.4 F (36.3 C) (Other (Comment))   Resp 18   Ht _0  (1.6 m)   Wt 176 lb (79.8 kg)   SpO2 97%   BMI 31.18 kg/m  CONSTITUTIONAL: No acute distress HEENT:  Normocephalic, atraumatic, extraocular motion intact. NECK: Trachea is  midline, and there is no jugular venous distension.  RESPIRATORY:  Lungs are clear, and breath sounds are equal bilaterally. Normal respiratory effort without pathologic use of accessory muscles. CARDIOVASCULAR: Heart is regular without murmurs, gallops, or rubs. BREAST:  Left breast with barely palpable mass at 2 o'clock, 7 cm from the nipple consistent with the mammogram findings.  Biopsy site is clean, dry, and steri strips have been removed at bedside.  There is no ecchymosis.  No lymphadenopathy on left axilla.  Right breast and axillary exam is negative. GI: The abdomen is soft, non-distended, non-tender.  MUSCULOSKELETAL:  Normal muscle strength and tone in all four extremities.  No peripheral edema or cyanosis. SKIN: Skin turgor is normal. There are no pathologic skin lesions.  NEUROLOGIC:  Motor and sensation is grossly normal.  Cranial nerves are grossly intact. PSYCH:  Alert and oriented to person, place and time. Affect is normal.  Laboratory Analysis: Labs 07/07/18: WBC 6.7, Hgb 15.3, Hct 45.6, Plt 232.  CA 27.29 30.3  Labs 05/27/18: Na 144, K 4.4, Cl 105, CO2 24, BUN 13, Cr 0.70.  AST 22, ALT 20, T bili 0.6, alk phos 78.  WBC 6.7, Hgb 14.8, Hct 45, Plt 89  Imaging: Mammogram and U/S 06/29/18: FINDINGS: No suspicious mass or malignant type microcalcifications identified in the right breast.  Lumpectomy changes are seen in the lateral aspect of the left breast. There is a 1.7 cm lobulated mass in the upper-outer quadrant of the left breast. Extending anterior to the mass is 6 cm of asymmetric linear fibroglandular tissue. There is also asymmetric irregular fibroglandular tissue posterior to the mass. This linear asymmetric fibroglandular tissue extending anterior from the mass and the asymmetric fibroglandular tissue posterior to the mass may be secondary to changes from the prior lumpectomy but malignancy could not be excluded. There are no malignant type  microcalcifications.  Mammographic images were processed with CAD.  On physical exam, there is firmness of the upper-outer quadrant of the left breast with no palpable mass.  Targeted ultrasound is performed, showing there is an irregular hypoechoic mass in left breast at 2 o'clock 7 cm from the nipple measuring 1.8 x 1.7 x 1.0 cm. Sonographic evaluation of the left axilla does not show any enlarged adenopathy.  IMPRESSION: Suspicious mass in the 2 o'clock region of the left breast. Indeterminate asymmetric fibroglandular tissue anterior and posterior to the suspicious mass.  Assessment and Plan: This is a 85 y.o. female with new left breast cancer.  Discussed with the patient that given her prior lumpectomy with radiation to the whole breast, the recommendation to treat this new cancer would be a left mastectomy and sentinel node biopsy rather than lumpectomy.  I did discuss her case with Dr. Baruch Gouty who recommended  that since her radiation was almost 30 years ago, she could have a lower dose/course of radiation just to the area of lumpectomy.  However, the patient would prefer to do a mastectomy instead.  She prefers to "take it out and put it in a frame".  Explained to her the risks of bleeding, infection, and injury to surrounding structures.  Discussed with her the post-operative outcomes, hospital stay, post-op drains, and follow up.  She is in agreement with this plan and wishes to proceed.  We will schedule her for 07/25/18 for left mastectomy and sentinel node biopsy.  Face-to-face time spent with the patient and care providers was 60 minutes, with more than 50% of the time spent counseling, educating, and coordinating care of the patient.     Melvyn Neth, [facility]

## 2018-07-12 NOTE — Progress Notes (Signed)
07/12/2018  Reason for Visit:  Left breast cancer  Referring Provider:  Halina Maidens, MD  History of Present Illness: Connie Mitchell is a 85 y.o. female with a prior history of left breast cancer, s/p lumpectomy and radiation in 1991.  She had a chest CT scan on 11/27 due to a history of cough for 4 years, and it had an incidental finding of a new 1.3 cm soft tissue mass in the left breast.  She had a diagnostic mammogram and ultrasound on 06/29/18 which confirmed a lobulated 1.7 cm mass in the upper outer quadrant of the left breast.  Her axilla was negative on ultrasound.  She had a biopsy of the mass which confirmed invasive mammary carcinoma, grade 1.  She presents today for surgical evaluation.  Menarche was at age 30.  Has never been pregnant nor used contraceptives, but used an estrogen patch around menopause for 1 year.  Menopause was at age 85.  Besides her personal history of breast cancer, there is family history of breast cancer in  maternal cousins, as well as history of prostate cancer in her brother.  Her last mammogram was in 2013.  She was not able to get mammogram before this year because she was moving a lot between cities and she was told sometimes that Medicare would not cover it.  She reports that she could feel something in the left breast for the last 3 years.  Denies any skin or nipple changes, denies any nipple drainage, or breast pain.  Past Medical History: Past Medical History:  Diagnosis Date  . GERD (gastroesophageal reflux disease)   . Personal history of radiation therapy 1991   left breast ca  . Primary osteoarthritis      Past Surgical History: Past Surgical History:  Procedure Laterality Date  . BREAST LUMPECTOMY Left 1991   breast ca  . cataracts Bilateral   . CYSTOCELE REPAIR  2010  . OOPHORECTOMY  1995   ovarian mass - benign  . REPLACEMENT TOTAL KNEE Bilateral   . TONSILLECTOMY  1958  . TOTAL SHOULDER ARTHROPLASTY Right     Home  Medications: Prior to Admission medications   Medication Sig Start Date End Date Taking? Authorizing Provider  Ibuprofen (ADVIL) 200 MG CAPS 2 capsules as needed.     [provider]  montelukast (SINGULAIR) 10 MG tablet TAKE 1 TABLET BY MOUTH EVERYDAY AT BEDTIME 06/02/18   Glean Hess, MD  omeprazole (PRILOSEC) 40 MG capsule Take 1 capsule (40 mg total) by mouth daily. 07/07/18   Glean Hess, MD  Papaya Enzyme CHEW 3 (three) times daily with meals.  06/15/16   [provider]    Allergies: Allergies  Allergen Reactions  . Ciprofloxacin Nausea And Vomiting    Social History:  reports that she has never smoked. She has never used smokeless tobacco. She reports current alcohol use. She reports that she does not use drugs.   Family History: Family History  Problem Relation Age of Onset  . Hypertension Mother   . Alzheimer's disease Mother   . Stroke Father   . Cancer Brother        prostate  . Atrial fibrillation Brother   . Cancer Other        breast  . Atrial fibrillation Brother   . Breast cancer Cousin 72       paternal x 2    Review of Systems: Review of Systems  Constitutional: Negative for chills and fever.  HENT:  Negative for hearing loss.   Eyes: Negative for blurred vision.  Respiratory: Negative for shortness of breath.   Cardiovascular: Negative for chest pain.  Gastrointestinal: Negative for abdominal pain, nausea and vomiting.  Genitourinary: Negative for dysuria.  Musculoskeletal: Positive for back pain. Negative for myalgias.  Skin: Negative for rash.  Neurological: Negative for dizziness.  Psychiatric/Behavioral: Negative for depression.    Physical Exam BP 137/77   Pulse 65   Temp (!) 97.4 F (36.3 C) (Other (Comment))   Resp 18   Ht _0  (1.6 m)   Wt 176 lb (79.8 kg)   SpO2 97%   BMI 31.18 kg/m  CONSTITUTIONAL: No acute distress HEENT:  Normocephalic, atraumatic, extraocular motion intact. NECK: Trachea is  midline, and there is no jugular venous distension.  RESPIRATORY:  Lungs are clear, and breath sounds are equal bilaterally. Normal respiratory effort without pathologic use of accessory muscles. CARDIOVASCULAR: Heart is regular without murmurs, gallops, or rubs. BREAST:  Left breast with barely palpable mass at 2 o'clock, 7 cm from the nipple consistent with the mammogram findings.  Biopsy site is clean, dry, and steri strips have been removed at bedside.  There is no ecchymosis.  No lymphadenopathy on left axilla.  Right breast and axillary exam is negative. GI: The abdomen is soft, non-distended, non-tender.  MUSCULOSKELETAL:  Normal muscle strength and tone in all four extremities.  No peripheral edema or cyanosis. SKIN: Skin turgor is normal. There are no pathologic skin lesions.  NEUROLOGIC:  Motor and sensation is grossly normal.  Cranial nerves are grossly intact. PSYCH:  Alert and oriented to person, place and time. Affect is normal.  Laboratory Analysis: Labs 07/07/18: WBC 6.7, Hgb 15.3, Hct 45.6, Plt 232.  CA 27.29 30.3  Labs 05/27/18: Na 144, K 4.4, Cl 105, CO2 24, BUN 13, Cr 0.70.  AST 22, ALT 20, T bili 0.6, alk phos 78.  WBC 6.7, Hgb 14.8, Hct 45, Plt 89  Imaging: Mammogram and U/S 06/29/18: FINDINGS: No suspicious mass or malignant type microcalcifications identified in the right breast.  Lumpectomy changes are seen in the lateral aspect of the left breast. There is a 1.7 cm lobulated mass in the upper-outer quadrant of the left breast. Extending anterior to the mass is 6 cm of asymmetric linear fibroglandular tissue. There is also asymmetric irregular fibroglandular tissue posterior to the mass. This linear asymmetric fibroglandular tissue extending anterior from the mass and the asymmetric fibroglandular tissue posterior to the mass may be secondary to changes from the prior lumpectomy but malignancy could not be excluded. There are no malignant type  microcalcifications.  Mammographic images were processed with CAD.  On physical exam, there is firmness of the upper-outer quadrant of the left breast with no palpable mass.  Targeted ultrasound is performed, showing there is an irregular hypoechoic mass in left breast at 2 o'clock 7 cm from the nipple measuring 1.8 x 1.7 x 1.0 cm. Sonographic evaluation of the left axilla does not show any enlarged adenopathy.  IMPRESSION: Suspicious mass in the 2 o'clock region of the left breast. Indeterminate asymmetric fibroglandular tissue anterior and posterior to the suspicious mass.  Assessment and Plan: This is a 85 y.o. female with new left breast cancer.  Discussed with the patient that given her prior lumpectomy with radiation to the whole breast, the recommendation to treat this new cancer would be a left mastectomy and sentinel node biopsy rather than lumpectomy.  I did discuss her case with Dr. Baruch Gouty who recommended  that since her radiation was almost 30 years ago, she could have a lower dose/course of radiation just to the area of lumpectomy.  However, the patient would prefer to do a mastectomy instead.  She prefers to "take it out and put it in a frame".  Explained to her the risks of bleeding, infection, and injury to surrounding structures.  Discussed with her the post-operative outcomes, hospital stay, post-op drains, and follow up.  She is in agreement with this plan and wishes to proceed.  We will schedule her for 07/25/18 for left mastectomy and sentinel node biopsy.  Face-to-face time spent with the patient and care providers was 60 minutes, with more than 50% of the time spent counseling, educating, and coordinating care of the patient.     Melvyn Neth, [facility]

## 2018-07-12 NOTE — Patient Instructions (Addendum)
We have spoken today about breast surgery. Your Mastectomy has been scheduled for 07/25/2018 at [facility] with Dr. Olean Ree.  You will most likely spend at least 1 night in the hospital following surgery and go home with 2-4 drains for approximately 5-7 days following your surgery. Please keep an accurate record of your drain amount in ml's or cc's. If your drain suddenly stops draining or has drainage around the tube at the skin, call our office and speak with a nurse immediately.    Information regarding your surgery has been provided below. If you have any questions or concerns, please call our office and speak with a nurse.   Total or Modified Radical Mastectomy A total mastectomy and a modified radical mastectomy are types of surgery for breast cancer. If you are having a total mastectomy (simple mastectomy), your entire breast will be removed. If you are having a modified radical mastectomy, your breast and nipple will be removed along with the lymph nodes under your arm. You may also have some of the lining over the muscle tissues under your breast removed. LET [facility] CARE PROVIDER KNOW ABOUT:  Any allergies you have.  All medicines you are taking, including vitamins, herbs, eye drops, creams, and over-the-counter medicines.  Previous problems you or members of your family have had with the use of anesthetics.  Any blood disorders you have.  Previous surgeries you have had.  Medical conditions you have. RISKS AND COMPLICATIONS Generally, this is a safe procedure. However, problems may occur, including:  Pain.  Infection.  Bleeding.  Scar tissue.  Chest numbness on the side of the surgery.  Fluid buildup under the skin flaps where your breast was removed (seroma).  Sensation of throbbing or tingling.  Stress or sadness from losing your breast. If you have the lymph nodes under your arm removed, you may have arm swelling, weakness, or numbness on the same side of  your body as your surgery. BEFORE THE PROCEDURE  Ask your health care provider about:  Changing or stopping your regular medicines. This is especially important if you are taking diabetes medicines or blood thinners.  Taking medicines such as aspirin and ibuprofen. These medicines can thin your blood. Do not take these medicines before your procedure if your health care provider instructs you not to.  Follow your health care provider's instructions about eating or drinking restrictions.  Plan to have someone take you home after the procedure. PROCEDURE  An IV tube will be inserted into one of your veins.  You will be given a medicine that makes you fall asleep (general anesthetic).  Your breast will be cleaned with a germ-killing solution (antiseptic).  A wide incision will be made around your nipple. The skin and nipple inside the incision will be removed along with all breast tissue.  If you are having a modified radical mastectomy:  The lining over your chest muscles will be removed.  The incision may be extended to reach the lymph nodes under your arm, or a second incision may be made.  The lymph nodes will be removed.  You may have a drainage tube inserted into your incision to collect fluid that builds up after surgery. This tube is connected to a suction bulb.  Your incision or incisions will be closed with stitches (sutures).  A bandage (dressing) will be placed over your breast and under your arm. The procedure may vary among health care providers and hospitals. AFTER THE PROCEDURE  You will be moved  to a recovery area.  Your blood pressure, heart rate, breathing rate, and blood oxygen level will be monitored often until the medicines you were given have worn off.  You will be given pain medicine as needed.  After a while, you will be taken to a hospital room.  You will be encouraged to get up and walk as soon as you can.  Your IV tube can be removed when you  are able to eat and drink.  Your drain may be removed before you go home from the hospital, or you may be sent home with your drain and suction bulb.   This information is not intended to replace advice given to you by your health care provider. Make sure you discuss any questions you have with your health care provider.   Document Released: 02/24/2001 Document Revised: 06/22/2014 Document Reviewed: 02/14/2014 Elsevier Interactive Patient Education [employer].

## 2018-07-13 ENCOUNTER — Telehealth: Payer: Self-pay | Admitting: *Deleted

## 2018-07-13 NOTE — Telephone Encounter (Signed)
Patient called the office back and was notified of pre-admit appointment date and time.   Also, aware of arrival time day of surgery.   Patient verbalizes understanding.

## 2018-07-13 NOTE — Telephone Encounter (Signed)
Message left for patient to call the office.   We need to inform her of Pre-admission appointment date and time- 07-19-18 at 10:45 am ([place], [address]).  Also, need to inform her of arrival time day of surgery- 07-25-18 at 9:15 am [facility], radiology desk-2nd desk on right).

## 2018-07-14 ENCOUNTER — Telehealth: Payer: Self-pay | Admitting: *Deleted

## 2018-07-14 NOTE — Telephone Encounter (Signed)
Patient called the office wanting to know if she could keep dentist appointment as scheduled for 07-17-18. The patient states that she will be a new patient so this should just be for a cleaning and x-rays.   The patient states that she does have to take antibiotics prior due to previous joint replacement.   Patient instructed that a regular cleaning and x-rays should be ok to have done prior to surgery. This was confirmed by the nurse today.

## 2018-07-19 ENCOUNTER — Other Ambulatory Visit: Payer: Self-pay

## 2018-07-19 ENCOUNTER — Encounter
Admission: RE | Admit: 2018-07-19 | Discharge: 2018-07-19 | Disposition: A | Discharge status: Home / Self Care | Payer: Medicare Other | Source: Ambulatory Visit | Attending: Surgery | Admitting: Surgery

## 2018-07-19 ENCOUNTER — Emergency Department
Admission: EM | Admit: 2018-07-19 | Discharge: 2018-07-19 | Disposition: A | Discharge status: Home / Self Care | Payer: Medicare Other | Attending: Emergency Medicine | Admitting: Emergency Medicine

## 2018-07-19 ENCOUNTER — Encounter: Payer: Self-pay | Admitting: Emergency Medicine

## 2018-07-19 ENCOUNTER — Emergency Department: Payer: Medicare Other

## 2018-07-19 DIAGNOSIS — S99922A Unspecified injury of left foot, initial encounter: Secondary | ICD-10-CM | POA: Diagnosis not present

## 2018-07-19 DIAGNOSIS — Z79899 Other long term (current) drug therapy: Secondary | ICD-10-CM | POA: Diagnosis not present

## 2018-07-19 DIAGNOSIS — Y998 Other external cause status: Secondary | ICD-10-CM | POA: Diagnosis not present

## 2018-07-19 DIAGNOSIS — Y9259 Other trade areas as the place of occurrence of the external cause: Secondary | ICD-10-CM | POA: Insufficient documentation

## 2018-07-19 DIAGNOSIS — S92345A Nondisplaced fracture of fourth metatarsal bone, left foot, initial encounter for closed fracture: Secondary | ICD-10-CM | POA: Diagnosis not present

## 2018-07-19 DIAGNOSIS — Z96653 Presence of artificial knee joint, bilateral: Secondary | ICD-10-CM | POA: Insufficient documentation

## 2018-07-19 DIAGNOSIS — X58XXXA Exposure to other specified factors, initial encounter: Secondary | ICD-10-CM | POA: Insufficient documentation

## 2018-07-19 DIAGNOSIS — Z96611 Presence of right artificial shoulder joint: Secondary | ICD-10-CM | POA: Insufficient documentation

## 2018-07-19 DIAGNOSIS — Z01818 Encounter for other preprocedural examination: Secondary | ICD-10-CM

## 2018-07-19 DIAGNOSIS — M79672 Pain in left foot: Secondary | ICD-10-CM | POA: Diagnosis not present

## 2018-07-19 DIAGNOSIS — R54 Age-related physical debility: Secondary | ICD-10-CM | POA: Diagnosis not present

## 2018-07-19 DIAGNOSIS — Y9389 Activity, other specified: Secondary | ICD-10-CM | POA: Diagnosis not present

## 2018-07-19 DIAGNOSIS — Z85828 Personal history of other malignant neoplasm of skin: Secondary | ICD-10-CM | POA: Insufficient documentation

## 2018-07-19 DIAGNOSIS — Z853 Personal history of malignant neoplasm of breast: Secondary | ICD-10-CM | POA: Insufficient documentation

## 2018-07-19 HISTORY — DX: Thoracic aortic aneurysm, without rupture, unspecified: I71.20

## 2018-07-19 HISTORY — DX: Gastro-esophageal reflux disease without esophagitis: K21.9

## 2018-07-19 HISTORY — DX: Malignant (primary) neoplasm, unspecified: C80.1

## 2018-07-19 HISTORY — DX: Cellulitis of left toe: L03.032

## 2018-07-19 HISTORY — DX: Personal history of irradiation: Z92.3

## 2018-07-19 HISTORY — DX: Cutaneous abscess of left foot: L02.612

## 2018-07-19 HISTORY — DX: Thoracic aortic aneurysm, without rupture: I71.2

## 2018-07-19 LAB — BASIC METABOLIC PANEL
Anion gap: 6 (ref 5–15)
BUN: 19 mg/dL (ref 8–23)
CO2: 27 mmol/L (ref 22–32)
Calcium: 9 mg/dL (ref 8.9–10.3)
Chloride: 105 mmol/L (ref 98–111)
Creatinine, Ser: 0.61 mg/dL (ref 0.44–1.00)
GFR calc Af Amer: >60 mL/min (ref >60)
GFR calc non Af Amer: >60 mL/min (ref >60)
Glucose, Bld: 80 mg/dL (ref 70–99)
Potassium: 3.9 mmol/L (ref 3.5–5.1)
Sodium: 138 mmol/L (ref 135–145)

## 2018-07-19 NOTE — ED Notes (Signed)
Pt states she was just her for preop for surgery due to breast CA, states she was at [employer] walking around and has sudden onset left foot pain with discoloration. Pt has strong pedal pulses with cap refill <3 noted on arrival, some brownish discoloration noted around the arch of the foot and great toe.

## 2018-07-19 NOTE — ED Notes (Signed)
ED Provider at bedside. 

## 2018-07-19 NOTE — Patient Instructions (Signed)
Your procedure is scheduled on: July 25, 2018  Report to [place], FIRST DESK AT 9:15 AM  Remember: Instructions that are not followed completely may result in serious medical risk,  up to and including death, or upon the discretion of your surgeon and anesthesiologist your  surgery may need to be rescheduled.     _X__ 1. Do not eat food after midnight the night before your procedure.                 No gum chewing or hard candies.                     ABSOLUTELY NOTHING SOLID IN YOUR MOUTH AFTER MIDNIGHT                  You may drink clear liquids up to 2 hours before you are scheduled to arrive for your surgery-                   DO not drink clear liquids within 2 hours of the start of your surgery.                  Clear Liquids include:  water, apple juice without pulp, clear carbohydrate                 drink such as Clearfast of Gatorade, Black Coffee or Tea (Do not add                 anything to coffee or tea).YOU MAY ADD SUGAR BUT DO NOT USE DAIRY PRODUCTS  __X__2.  On the morning of surgery brush your teeth with toothpaste and water,                    You may rinse your mouth with mouthwash if you wish.                         Do not swallow any toothpaste of mouthwash.     _X__ 3.  No Alcohol for 24 hours before or after surgery.   _X__ 4.  Do Not Smoke or use e-cigarettes For 24 Hours Prior to Your Surgery.                 Do not use any chewable tobacco products for at least 6 hours prior to                 surgery.  ____  5.  Bring all medications with you on the day of surgery if instructed.   ____  6.  Notify your doctor if there is any change in your medical condition      (cold, fever, infections).     Do not wear jewelry, make-up, hairpins, clips or nail polish. Do not wear lotions, powders, or perfumes. You may NOT wear deodorant. Do not shave 48 hours prior to surgery. Men may shave face and neck. Do not bring valuables  to the hospital.    [facility] is not responsible for any belongings or valuables.  Contacts, dentures or bridgework may not be worn into surgery. Leave your suitcase in the car. After surgery it may be brought to your room. For patients admitted to the hospital, discharge time is determined by your treatment team.   Patients discharged the day of surgery will not be allowed to drive home.   Please read over the following fact  sheets that you were given:   PREPARING FOR SURGERY               ADVANCE DIRECTIVES  __X__ Take these medicines the morning of surgery with A SIP OF WATER:    1. PRILOSEC .Marland KitchenTAKE THE NIGHT BEFORE AND THE DAY OF SURGERY  2.   3.   4.  5.  6.  ____ Fleet Enema (as directed)   __X__ Use CHG Soap as directed  __X__ Stop ALL ASPIRIN PRODUCTS AS OF TODAY  _X___ Stop Anti-inflammatories AS OF TODAY   _X___ Stop supplements until after surgery.                  STOP PAPAYA CHEW NOW!!  ____ Bring C-Pap to the hospital.   BRING A FRONT ENTRY BRA WITH  YOU TO THE HOSPITAL  WEAR SOMETHING EASY TO GET YOUR LEFT ARM INTO.  HAVE STOOL SOFTENERS AT HOME.  BRING THE MEDICAL DIRECTIVES WITH YOU IF NOTARIZED SO THAT WE     MAY MAKE A COPY FOR YOUR CHART.

## 2018-07-19 NOTE — ED Notes (Signed)
Pulses 1+ bilat. L foot remains in a slight reddened state; pt states it looks a "much better color" than either today; warm; dry; small blister on inside of foot noted. Edema in both ankles.

## 2018-07-19 NOTE — Discharge Instructions (Signed)
Results for orders placed or performed during the hospital encounter of 88/32/54  Basic metabolic panel  Result Value Ref Range   Sodium 138 135 - 145 mmol/L   Potassium 3.9 3.5 - 5.1 mmol/L   Chloride 105 98 - 111 mmol/L   CO2 27 22 - 32 mmol/L   Glucose, Bld 80 70 - 99 mg/dL   BUN 19 8 - 23 mg/dL   Creatinine, Ser 0.61 0.44 - 1.00 mg/dL   Calcium 9.0 8.9 - 10.3 mg/dL   GFR calc non Af Amer >60 >60 mL/min   GFR calc Af Amer >60 >60 mL/min   Anion gap 6 5 - 15   Dg Foot Complete Left  Result Date: 07/19/2018 CLINICAL DATA:  Pt arrived with concerns over pain to her left foot. Initially pain started as a stiffness but progressed to strong pain that pt is unable to describe. EXAM: LEFT FOOT - COMPLETE 3+ VIEW COMPARISON:  None. FINDINGS: The bones are diffusely demineralized. There is cortical thickening and mild cortical regularity medially along the proximal shaft of the 4th metatarsal. This could be related to an old fracture or an ongoing stress fracture. There is a possible erosion of the 5th metatarsal head laterally. Mild midfoot degenerative changes are present. There is possible mild soft tissue swelling over the base of the 5th metatarsal and the 5th metatarsophalangeal joint. No foreign bodies or bone destruction identified. IMPRESSION: 1. Possible stress fracture of the 4th metatarsal base. Correlate clinically. 2. Possible erosion of the 5th metatarsal head laterally. Electronically Signed   By: Richardean Sale M.D.   On: 07/19/2018 15:48   US Breast Ltd Uni Left Inc Axilla  Addendum Date: 07/01/2018   ADDENDUM REPORT: 07/01/2018 08:48 ADDENDUM: Prior mammograms dated 05/27/2012 have become available for comparison. The 1.7 cm lobulated mass in the upper-outer quadrant of the left breast has developed since the prior exam. The asymmetric tissue posterior and anterior to the mass is stable from the prior exam and therefore felt to be benign. IMPRESSION: Suspicious mass in the 2 o'clock  region of the left breast. Recommendation: Ultrasound-guided core biopsy of the mass in the 2 o'clock region of the left breast. Asymmetries anterior and posterior to the mass are stable from prior exam and felt to be benign. BI-RADS category:  4: Suspicious. Electronically Signed   By: Lillia Mountain M.D.   On: 07/01/2018 08:48   Result Date: 07/01/2018 CLINICAL DATA:  History of left breast cancer status post lumpectomy in 1991. Left breast mass seen on recent CT scan of the chest dated 05/10/2018. EXAM: DIGITAL DIAGNOSTIC BILATERAL MAMMOGRAM WITH CAD AND TOMO ULTRASOUND LEFT BREAST COMPARISON:  None. ACR Breast Density Category b: There are scattered areas of fibroglandular density. FINDINGS: No suspicious mass or malignant type microcalcifications identified in the right breast. Lumpectomy changes are seen in the lateral aspect of the left breast. There is a 1.7 cm lobulated mass in the upper-outer quadrant of the left breast. Extending anterior to the mass is 6 cm of asymmetric linear fibroglandular tissue. There is also asymmetric irregular fibroglandular tissue posterior to the mass. This linear asymmetric fibroglandular tissue extending anterior from the mass and the asymmetric fibroglandular tissue posterior to the mass may be secondary to changes from the prior lumpectomy but malignancy could not be excluded. There are no malignant type microcalcifications. Mammographic images were processed with CAD. On physical exam, there is firmness of the upper-outer quadrant of the left breast with no palpable mass. Targeted ultrasound  is performed, showing there is an irregular hypoechoic mass in left breast at 2 o'clock 7 cm from the nipple measuring 1.8 x 1.7 x 1.0 cm. Sonographic evaluation of the left axilla does not show any enlarged adenopathy. IMPRESSION: Suspicious mass in the 2 o'clock region of the left breast. Indeterminate asymmetric fibroglandular tissue anterior and posterior to the suspicious mass.  RECOMMENDATION: Ultrasound-guided core biopsy of the mass in the 2 o'clock region of the left breast is recommended. If the mass is positive for malignancy breast MRI is recommended to evaluate the areas of asymmetric fibroglandular tissue in the left breast. I have discussed the findings and recommendations with the patient. Results were also provided in writing at the conclusion of the visit. If applicable, a reminder letter will be sent to the patient regarding the next appointment. BI-RADS CATEGORY  4: Suspicious. Electronically Signed: By: Lillia Mountain M.D. On: 06/29/2018 16:36   Mm Diag Breast Tomo Bilateral  Addendum Date: 07/01/2018   ADDENDUM REPORT: 07/01/2018 08:48 ADDENDUM: Prior mammograms dated 05/27/2012 have become available for comparison. The 1.7 cm lobulated mass in the upper-outer quadrant of the left breast has developed since the prior exam. The asymmetric tissue posterior and anterior to the mass is stable from the prior exam and therefore felt to be benign. IMPRESSION: Suspicious mass in the 2 o'clock region of the left breast. Recommendation: Ultrasound-guided core biopsy of the mass in the 2 o'clock region of the left breast. Asymmetries anterior and posterior to the mass are stable from prior exam and felt to be benign. BI-RADS category:  4: Suspicious. Electronically Signed   By: Lillia Mountain M.D.   On: 07/01/2018 08:48   Result Date: 07/01/2018 CLINICAL DATA:  History of left breast cancer status post lumpectomy in 1991. Left breast mass seen on recent CT scan of the chest dated 05/10/2018. EXAM: DIGITAL DIAGNOSTIC BILATERAL MAMMOGRAM WITH CAD AND TOMO ULTRASOUND LEFT BREAST COMPARISON:  None. ACR Breast Density Category b: There are scattered areas of fibroglandular density. FINDINGS: No suspicious mass or malignant type microcalcifications identified in the right breast. Lumpectomy changes are seen in the lateral aspect of the left breast. There is a 1.7 cm lobulated mass in the  upper-outer quadrant of the left breast. Extending anterior to the mass is 6 cm of asymmetric linear fibroglandular tissue. There is also asymmetric irregular fibroglandular tissue posterior to the mass. This linear asymmetric fibroglandular tissue extending anterior from the mass and the asymmetric fibroglandular tissue posterior to the mass may be secondary to changes from the prior lumpectomy but malignancy could not be excluded. There are no malignant type microcalcifications. Mammographic images were processed with CAD. On physical exam, there is firmness of the upper-outer quadrant of the left breast with no palpable mass. Targeted ultrasound is performed, showing there is an irregular hypoechoic mass in left breast at 2 o'clock 7 cm from the nipple measuring 1.8 x 1.7 x 1.0 cm. Sonographic evaluation of the left axilla does not show any enlarged adenopathy. IMPRESSION: Suspicious mass in the 2 o'clock region of the left breast. Indeterminate asymmetric fibroglandular tissue anterior and posterior to the suspicious mass. RECOMMENDATION: Ultrasound-guided core biopsy of the mass in the 2 o'clock region of the left breast is recommended. If the mass is positive for malignancy breast MRI is recommended to evaluate the areas of asymmetric fibroglandular tissue in the left breast. I have discussed the findings and recommendations with the patient. Results were also provided in writing at the conclusion of the  visit. If applicable, a reminder letter will be sent to the patient regarding the next appointment. BI-RADS CATEGORY  4: Suspicious. Electronically Signed: By: Lillia Mountain M.D. On: 06/29/2018 16:36   Mm Clip Placement Left  Result Date: 07/01/2018 CLINICAL DATA:  Status post ultrasound-guided core biopsy of a left breast mass. EXAM: DIAGNOSTIC LEFT MAMMOGRAM POST ULTRASOUND BIOPSY COMPARISON:  Previous exam(s). FINDINGS: Mammographic images were obtained following ultrasound guided biopsy of a mass in the  2 o'clock region of the left breast. Mammographic images show there is a venous shaped clip in appropriate position in the mass in the upper-outer quadrant of the left breast. IMPRESSION: Status post ultrasound-guided core biopsy of a left breast mass with pathology pending. Final Assessment: Post Procedure Mammograms for Marker Placement Electronically Signed   By: Lillia Mountain M.D.   On: 07/01/2018 09:36   [place] Lt Breast Bx W Loc Dev 1st Lesion Img Bx Spec US Guide  Addendum Date: 07/04/2018   ADDENDUM REPORT: 07/04/2018 11:35 ADDENDUM: Invasive mammary carcinoma was reported histologically. This corresponds well with the imaging findings. Patient was contacted by telephone and given the results of the biopsy. She states the wound site is clean and dry with no signs of hematoma or infection. The patient is aware that the nurse navigator will be contacting her to arrange surgical consultation. Electronically Signed   By: Lillia Mountain M.D.   On: 07/04/2018 11:35   Result Date: 07/04/2018 CLINICAL DATA:  Left breast mass. EXAM: ULTRASOUND GUIDED LEFT BREAST CORE NEEDLE BIOPSY COMPARISON:  Previous exam(s). FINDINGS: I met with the patient and we discussed the procedure of ultrasound-guided biopsy, including benefits and alternatives. We discussed the high likelihood of a successful procedure. We discussed the risks of the procedure, including infection, bleeding, tissue injury, clip migration, and inadequate sampling. Informed written consent was given. The usual time-out protocol was performed immediately prior to the procedure. Lesion quadrant: Upper-outer quadrant Using sterile technique and 1% lidocaine and 1% lidocaine with epinephrine as local anesthetic, under direct ultrasound visualization, a 12 gauge spring-loaded device was used to perform biopsy of the mass in the 2 o'clock region of the left breast using a lateral to medial approach. At the conclusion of the procedure a venous shaped tissue marker clip  was deployed into the biopsy cavity. Follow up 2 view mammogram was performed and dictated separately. IMPRESSION: Ultrasound guided biopsy of the left breast. No apparent complications. Electronically Signed: By: Lillia Mountain M.D. On: 07/01/2018 09:16

## 2018-07-19 NOTE — ED Provider Notes (Signed)
[facility] Emergency Department Provider Note  ____________________________________________  Time seen: Approximately 6:50 PM  I have reviewed the triage vital signs and the nursing notes.   HISTORY  Chief Complaint Foot Pain    HPI Connie Mitchell is a 85 y.o. female with a history of breast cancer plan for mastectomy comes the ED complaining of sudden onset of left foot pain that occurred while she was at [employer] shopping today.  She is able to walk on it but it hurts to bear weight.  No alleviating factors.  Denies any falls or sudden trauma.  Nonradiating.  Moderate intensity.      Past Medical History:  Diagnosis Date  . Cancer ([place]) 1991   left breast. treated with rads and lumpectomy  . Cancer ([place]) 2020   newly diagnosed, also left breast  . Cellulitis and abscess of toe of left foot 01/2018  . GERD (gastroesophageal reflux disease)   . Hx of therapeutic radiation 1991   left breast  . Laryngopharyngeal reflux    sometimes food goes down wrong way and she ends up in extreme coughing/choking scenario  . Personal history of radiation therapy 1991   left breast ca  . Primary osteoarthritis   . Thoracic aortic aneurysm (TAA) [facility])      Patient Active Problem List   Diagnosis Date Noted  . Osteopenia of neck of right femur 07/12/2018  . Malignant neoplasm of upper-outer quadrant of left female breast ([place]) 07/07/2018  . Thoracic ascending aortic aneurysm ([place]) 05/27/2018  . LPRD (laryngopharyngeal reflux disease) 03/17/2018  . Post-nasal drip 03/17/2018  . Allergic cough 11/25/2017  . Arthritis involving multiple sites 11/25/2017  . Chronic foot pain 11/25/2017  . Lumbar degenerative disc disease 09/24/2017  . Hx of Lyme disease 09/24/2017  . Hx of basal cell carcinoma 09/24/2017  . Elevated blood pressure reading 09/24/2017  . Nuclear sclerotic cataract of left eye 08/09/2015  . Osteoporosis 07/04/2014  . Primary localized osteoarthrosis  of shoulder region 12/13/2012  . History of breast cancer 12/18/2011  . Osteoarthritis 12/18/2011     Past Surgical History:  Procedure Laterality Date  . BREAST LUMPECTOMY Left 1991   breast ca, lumpectomy  . cataracts Bilateral   . CYSTOCELE REPAIR  2010  . EYE SURGERY Bilateral 2017   cataracts extracted  . JOINT REPLACEMENT    . OOPHORECTOMY  1995   ovarian mass - benign  . REPLACEMENT TOTAL KNEE Bilateral 2012,2013  . SKIN BIOPSY Left    arm growth  . TONSILLECTOMY  1958  . TOTAL SHOULDER ARTHROPLASTY Right 2016     Prior to Admission medications   Medication Sig Start Date End Date Taking? Authorizing Provider  loratadine (CLARITIN REDITABS) 10 MG dissolvable tablet Take 10 mg by mouth at bedtime.    [provider]  montelukast (SINGULAIR) 10 MG tablet TAKE 1 TABLET BY MOUTH EVERYDAY AT BEDTIME Patient taking differently: Take 10 mg by mouth at bedtime.  06/02/18   Glean Hess, MD  omeprazole (PRILOSEC) 40 MG capsule Take 1 capsule (40 mg total) by mouth daily. 07/07/18   Glean Hess, MD  Papaya Enzyme CHEW Chew 2 each by mouth 3 (three) times daily with meals.     [provider]     Allergies Ciprofloxacin   Family History  Problem Relation Age of Onset  . Hypertension Mother   . Alzheimer's disease Mother   . Stroke Father   . Cancer Brother  prostate  . Atrial fibrillation Brother   . Cancer Other        breast  . Atrial fibrillation Brother   . Breast cancer Cousin 72       paternal x 2    Social History Social History   Tobacco Use  . Smoking status: Never Smoker  . Smokeless tobacco: Never Used  . Tobacco comment: smoking cessation materials not required  Substance Use Topics  . Alcohol use: Yes    Comment: occasional  . Drug use: Never    Review of Systems  Constitutional:   No fever or chills.  ENT:   No sore throat. No rhinorrhea. Cardiovascular:   No chest pain or syncope. Respiratory:   No  dyspnea or cough. Gastrointestinal:   Negative for abdominal pain, vomiting and diarrhea.  Musculoskeletal: Left foot pain as above All other systems reviewed and are negative except as documented above in ROS and HPI.  ____________________________________________   PHYSICAL EXAM:  VITAL SIGNS: ED Triage Vitals  Enc Vitals Group     BP 07/19/18 1431 (!) 143/75     Pulse Rate 07/19/18 1431 65     Resp 07/19/18 1431 18     Temp 07/19/18 1431 98.4 F (36.9 C)     Temp Source 07/19/18 1431 Oral     SpO2 07/19/18 1431 98 %     Weight 07/19/18 1431 175 lb (79.4 kg)     Height 07/19/18 1431 5\' 3"  (1.6 m)     Head Circumference --      Peak Flow --      Pain Score 07/19/18 1438 6     Pain Loc --      Pain Edu? --      Excl. in [place]? --     Vital signs reviewed, nursing assessments reviewed.   Constitutional:   Alert and oriented. Non-toxic appearance.  ENT      Head:   Normocephalic and atraumatic.      Neck:   No meningismus. Full ROM. Cardiovascular:   RRR.  Respiratory:   Normal respiratory effort without tachypnea . Musculoskeletal:   Normal range of motion in all extremities. No joint effusions.  Mild tenderness over the plantar surface of the fourth metatarsal without deformity.  No significant swelling or ecchymosis.. Neurologic:   Normal speech and language.  Motor grossly intact. No acute focal neurologic deficits are appreciated.  Skin:    Skin is warm, dry and intact on the left foot ____________________________________________    LABS (pertinent positives/negatives) (all labs ordered are listed, but only abnormal results are displayed) Labs Reviewed - No data to display ____________________________________________   EKG    ____________________________________________    RADIOLOGY  Dg Foot Complete Left  Result Date: 07/19/2018 CLINICAL DATA:  Pt arrived with concerns over pain to her left foot. Initially pain started as a stiffness but progressed to  strong pain that pt is unable to describe. EXAM: LEFT FOOT - COMPLETE 3+ VIEW COMPARISON:  None. FINDINGS: The bones are diffusely demineralized. There is cortical thickening and mild cortical regularity medially along the proximal shaft of the 4th metatarsal. This could be related to an old fracture or an ongoing stress fracture. There is a possible erosion of the 5th metatarsal head laterally. Mild midfoot degenerative changes are present. There is possible mild soft tissue swelling over the base of the 5th metatarsal and the 5th metatarsophalangeal joint. No foreign bodies or bone destruction identified. IMPRESSION: 1. Possible stress fracture of  the 4th metatarsal base. Correlate clinically. 2. Possible erosion of the 5th metatarsal head laterally. Electronically Signed   By: Richardean Sale M.D.   On: 07/19/2018 15:48    ____________________________________________   PROCEDURES Procedures  ____________________________________________    CLINICAL IMPRESSION / ASSESSMENT AND PLAN / ED COURSE  Pertinent labs & imaging results that were available during my care of the patient were reviewed by me and considered in my medical decision making (see chart for details).    Patient presents with sudden left foot pain which appears to be due to a stress fracture of the fourth metatarsal, nondisplaced.  Patient is relatively comfortable.  Postop shoe for the foot, follow-up with podiatry.      ____________________________________________   FINAL CLINICAL IMPRESSION(S) / ED DIAGNOSES    Final diagnoses:  Closed nondisplaced fracture of fourth metatarsal bone of left foot, initial encounter     ED Discharge Orders    None      Portions of this note were generated with dragon dictation software. Dictation errors may occur despite best attempts at proofreading.   Carrie Mew, MD 07/19/18 1940

## 2018-07-19 NOTE — ED Triage Notes (Signed)
Pt arrived with concerns over pain to her left foot. Initially pain started as a stiffness but progressed to strong pain that pt is unable to describe. Discoloration observed to left foot but no obvious deformity.

## 2018-07-24 MED ORDER — CEFAZOLIN SODIUM-DEXTROSE 2-4 GM/100ML-% IV SOLN
2.0000 g | INTRAVENOUS | Status: AC
  Administered 2018-07-25: 2 g via INTRAVENOUS

## 2018-07-25 ENCOUNTER — Encounter: Payer: Self-pay | Admitting: Anesthesiology

## 2018-07-25 ENCOUNTER — Encounter
Admission: RE | Disposition: A | Discharge status: Home / Self Care | Payer: Self-pay | Source: Home / Self Care | Attending: Surgery

## 2018-07-25 ENCOUNTER — Other Ambulatory Visit: Payer: Self-pay

## 2018-07-25 ENCOUNTER — Observation Stay
Admission: RE | Admit: 2018-07-25 | Discharge: 2018-07-26 | Disposition: A | Discharge status: Home / Self Care | Payer: Medicare Other | Attending: Surgery | Admitting: Surgery

## 2018-07-25 ENCOUNTER — Ambulatory Visit: Payer: Medicare Other | Admitting: Anesthesiology

## 2018-07-25 ENCOUNTER — Encounter
Admission: RE | Admit: 2018-07-25 | Discharge: 2018-07-25 | Disposition: A | Discharge status: Home / Self Care | Payer: Medicare Other | Source: Ambulatory Visit | Attending: Surgery | Admitting: Surgery

## 2018-07-25 DIAGNOSIS — K219 Gastro-esophageal reflux disease without esophagitis: Secondary | ICD-10-CM | POA: Insufficient documentation

## 2018-07-25 DIAGNOSIS — M1991 Primary osteoarthritis, unspecified site: Secondary | ICD-10-CM | POA: Diagnosis not present

## 2018-07-25 DIAGNOSIS — Z96611 Presence of right artificial shoulder joint: Secondary | ICD-10-CM | POA: Diagnosis not present

## 2018-07-25 DIAGNOSIS — Z8042 Family history of malignant neoplasm of prostate: Secondary | ICD-10-CM | POA: Insufficient documentation

## 2018-07-25 DIAGNOSIS — C50412 Malignant neoplasm of upper-outer quadrant of left female breast: Secondary | ICD-10-CM

## 2018-07-25 DIAGNOSIS — Z881 Allergy status to other antibiotic agents status: Secondary | ICD-10-CM | POA: Insufficient documentation

## 2018-07-25 DIAGNOSIS — Z96653 Presence of artificial knee joint, bilateral: Secondary | ICD-10-CM | POA: Diagnosis not present

## 2018-07-25 DIAGNOSIS — I712 Thoracic aortic aneurysm, without rupture: Secondary | ICD-10-CM | POA: Diagnosis not present

## 2018-07-25 DIAGNOSIS — Z820 Family history of epilepsy and other diseases of the nervous system: Secondary | ICD-10-CM | POA: Insufficient documentation

## 2018-07-25 DIAGNOSIS — Z803 Family history of malignant neoplasm of breast: Secondary | ICD-10-CM | POA: Insufficient documentation

## 2018-07-25 DIAGNOSIS — Z170 Estrogen receptor positive status [ER+]: Secondary | ICD-10-CM | POA: Insufficient documentation

## 2018-07-25 DIAGNOSIS — Z6831 Body mass index (BMI) 31.0-31.9, adult: Secondary | ICD-10-CM | POA: Insufficient documentation

## 2018-07-25 DIAGNOSIS — Z823 Family history of stroke: Secondary | ICD-10-CM | POA: Insufficient documentation

## 2018-07-25 DIAGNOSIS — E669 Obesity, unspecified: Secondary | ICD-10-CM | POA: Insufficient documentation

## 2018-07-25 DIAGNOSIS — Z8249 Family history of ischemic heart disease and other diseases of the circulatory system: Secondary | ICD-10-CM | POA: Diagnosis not present

## 2018-07-25 DIAGNOSIS — C50912 Malignant neoplasm of unspecified site of left female breast: Secondary | ICD-10-CM | POA: Diagnosis not present

## 2018-07-25 DIAGNOSIS — Z923 Personal history of irradiation: Secondary | ICD-10-CM | POA: Insufficient documentation

## 2018-07-25 HISTORY — PX: MASTECTOMY: SHX3

## 2018-07-25 HISTORY — PX: MASTECTOMY W/ SENTINEL NODE BIOPSY: SHX2001

## 2018-07-25 SURGERY — MASTECTOMY WITH SENTINEL LYMPH NODE BIOPSY
Anesthesia: General | Site: Breast | Laterality: Left

## 2018-07-25 MED ORDER — FENTANYL CITRATE (PF) 100 MCG/2ML IJ SOLN
INTRAMUSCULAR | Status: AC
  Filled 2018-07-25: qty 2

## 2018-07-25 MED ORDER — LIDOCAINE HCL (CARDIAC) PF 100 MG/5ML IV SOSY
PREFILLED_SYRINGE | INTRAVENOUS | Status: DC | PRN
  Administered 2018-07-25: 60 mg via INTRAVENOUS

## 2018-07-25 MED ORDER — PANTOPRAZOLE SODIUM 40 MG PO TBEC
40.0000 mg | DELAYED_RELEASE_TABLET | Freq: Every day | ORAL | Status: DC
  Administered 2018-07-26: 40 mg via ORAL
  Filled 2018-07-25: qty 1

## 2018-07-25 MED ORDER — ONDANSETRON HCL 4 MG/2ML IJ SOLN: 4.0000 mg | Freq: Four times a day (QID) | INTRAMUSCULAR | Status: DC | PRN

## 2018-07-25 MED ORDER — DEXAMETHASONE SODIUM PHOSPHATE 10 MG/ML IJ SOLN
INTRAMUSCULAR | Status: DC | PRN
  Administered 2018-07-25: 10 mg via INTRAVENOUS

## 2018-07-25 MED ORDER — BUPIVACAINE LIPOSOME 1.3 % IJ SUSP
INTRAMUSCULAR | Status: DC | PRN
  Administered 2018-07-25: 50 mL

## 2018-07-25 MED ORDER — OXYCODONE HCL 5 MG PO TABS: 5.0000 mg | ORAL_TABLET | Freq: Once | ORAL | Status: DC | PRN

## 2018-07-25 MED ORDER — EPHEDRINE SULFATE 50 MG/ML IJ SOLN
INTRAMUSCULAR | Status: AC
  Filled 2018-07-25: qty 1

## 2018-07-25 MED ORDER — EPHEDRINE SULFATE 50 MG/ML IJ SOLN
INTRAMUSCULAR | Status: DC | PRN
  Administered 2018-07-25: 10 mg via INTRAVENOUS
  Administered 2018-07-25: 5 mg via INTRAVENOUS

## 2018-07-25 MED ORDER — OXYCODONE HCL 5 MG/5ML PO SOLN: 5.0000 mg | Freq: Once | ORAL | Status: DC | PRN

## 2018-07-25 MED ORDER — ACETAMINOPHEN 500 MG PO TABS
1000.0000 mg | ORAL_TABLET | Freq: Four times a day (QID) | ORAL | Status: DC
  Administered 2018-07-25 – 2018-07-26 (×4): 1000 mg via ORAL
  Filled 2018-07-25 (×4): qty 2

## 2018-07-25 MED ORDER — GLYCOPYRROLATE 0.2 MG/ML IJ SOLN
INTRAMUSCULAR | Status: AC
  Filled 2018-07-25: qty 1

## 2018-07-25 MED ORDER — ONDANSETRON HCL 4 MG/2ML IJ SOLN
INTRAMUSCULAR | Status: DC | PRN
  Administered 2018-07-25: 4 mg via INTRAVENOUS

## 2018-07-25 MED ORDER — SODIUM CHLORIDE (PF) 0.9 % IJ SOLN
INTRAVENOUS | Status: DC | PRN
  Administered 2018-07-25: 5 mL via SUBCUTANEOUS

## 2018-07-25 MED ORDER — CHLORHEXIDINE GLUCONATE CLOTH 2 % EX PADS: 6.0000 | MEDICATED_PAD | Freq: Once | CUTANEOUS | Status: DC

## 2018-07-25 MED ORDER — POLYETHYLENE GLYCOL 3350 17 G PO PACK: 17.0000 g | PACK | Freq: Every day | ORAL | Status: DC | PRN

## 2018-07-25 MED ORDER — DEXAMETHASONE SODIUM PHOSPHATE 10 MG/ML IJ SOLN
INTRAMUSCULAR | Status: AC
  Filled 2018-07-25: qty 1

## 2018-07-25 MED ORDER — LACTATED RINGERS IV SOLN
INTRAVENOUS | Status: DC
  Administered 2018-07-25 – 2018-07-26 (×2): via INTRAVENOUS

## 2018-07-25 MED ORDER — MONTELUKAST SODIUM 10 MG PO TABS
10.0000 mg | ORAL_TABLET | Freq: Every day | ORAL | Status: DC
  Administered 2018-07-25: 10 mg via ORAL
  Filled 2018-07-25: qty 1

## 2018-07-25 MED ORDER — PROPOFOL 10 MG/ML IV BOLUS
INTRAVENOUS | Status: DC | PRN
  Administered 2018-07-25: 120 mg via INTRAVENOUS

## 2018-07-25 MED ORDER — FENTANYL CITRATE (PF) 100 MCG/2ML IJ SOLN: 25.0000 ug | INTRAMUSCULAR | Status: DC | PRN

## 2018-07-25 MED ORDER — SODIUM CHLORIDE (PF) 0.9 % IJ SOLN
INTRAMUSCULAR | Status: AC
  Filled 2018-07-25: qty 10

## 2018-07-25 MED ORDER — LORATADINE 10 MG PO TABS
10.0000 mg | ORAL_TABLET | Freq: Every day | ORAL | Status: DC
  Administered 2018-07-25: 10 mg via ORAL
  Filled 2018-07-25: qty 1

## 2018-07-25 MED ORDER — ACETAMINOPHEN 500 MG PO TABS
ORAL_TABLET | ORAL | Status: AC
  Filled 2018-07-25: qty 2

## 2018-07-25 MED ORDER — OXYCODONE HCL 5 MG PO TABS: 5.0000 mg | ORAL_TABLET | ORAL | Status: DC | PRN

## 2018-07-25 MED ORDER — GLYCOPYRROLATE 0.2 MG/ML IJ SOLN
INTRAMUSCULAR | Status: DC | PRN
  Administered 2018-07-25: 0.2 mg via INTRAVENOUS

## 2018-07-25 MED ORDER — ONDANSETRON 4 MG PO TBDP: 4.0000 mg | ORAL_TABLET | Freq: Four times a day (QID) | ORAL | Status: DC | PRN

## 2018-07-25 MED ORDER — LACTATED RINGERS IV SOLN
INTRAVENOUS | Status: DC
  Administered 2018-07-25 (×2): via INTRAVENOUS

## 2018-07-25 MED ORDER — ACETAMINOPHEN 500 MG PO TABS
1000.0000 mg | ORAL_TABLET | ORAL | Status: AC
  Administered 2018-07-25: 1000 mg via ORAL

## 2018-07-25 MED ORDER — FENTANYL CITRATE (PF) 100 MCG/2ML IJ SOLN
INTRAMUSCULAR | Status: DC | PRN
  Administered 2018-07-25: 25 ug via INTRAVENOUS
  Administered 2018-07-25: 50 ug via INTRAVENOUS
  Administered 2018-07-25: 25 ug via INTRAVENOUS
  Administered 2018-07-25: 50 ug via INTRAVENOUS
  Administered 2018-07-25 (×2): 25 ug via INTRAVENOUS

## 2018-07-25 MED ORDER — CEFAZOLIN SODIUM-DEXTROSE 2-4 GM/100ML-% IV SOLN
INTRAVENOUS | Status: AC
  Filled 2018-07-25: qty 100

## 2018-07-25 MED ORDER — ONDANSETRON HCL 4 MG/2ML IJ SOLN
INTRAMUSCULAR | Status: AC
  Filled 2018-07-25: qty 2

## 2018-07-25 MED ORDER — BUPIVACAINE LIPOSOME 1.3 % IJ SUSP: 20.0000 mL | Freq: Once | INTRAMUSCULAR | Status: DC

## 2018-07-25 MED ORDER — ONDANSETRON HCL 4 MG/2ML IJ SOLN: 4.0000 mg | Freq: Once | INTRAMUSCULAR | Status: DC | PRN

## 2018-07-25 MED ORDER — MENTHOL 3 MG MT LOZG
1.0000 | LOZENGE | OROMUCOSAL | Status: DC | PRN
  Administered 2018-07-25: 3 mg via ORAL
  Filled 2018-07-25: qty 9

## 2018-07-25 MED ORDER — HYDROMORPHONE HCL 1 MG/ML IJ SOLN: 0.5000 mg | INTRAMUSCULAR | Status: DC | PRN

## 2018-07-25 MED ORDER — GABAPENTIN 300 MG PO CAPS
ORAL_CAPSULE | ORAL | Status: AC
  Filled 2018-07-25: qty 1

## 2018-07-25 MED ORDER — KETOROLAC TROMETHAMINE 15 MG/ML IJ SOLN
15.0000 mg | Freq: Four times a day (QID) | INTRAMUSCULAR | Status: DC
  Administered 2018-07-25 – 2018-07-26 (×4): 15 mg via INTRAVENOUS
  Filled 2018-07-25 (×4): qty 1

## 2018-07-25 MED ORDER — PROPOFOL 10 MG/ML IV BOLUS
INTRAVENOUS | Status: AC
  Filled 2018-07-25: qty 20

## 2018-07-25 MED ORDER — TECHNETIUM TC 99M SULFUR COLLOID FILTERED
0.9310 | Freq: Once | INTRAVENOUS | Status: AC | PRN
  Administered 2018-07-25: 0.931 via INTRADERMAL

## 2018-07-25 MED ORDER — GABAPENTIN 300 MG PO CAPS
300.0000 mg | ORAL_CAPSULE | ORAL | Status: AC
  Administered 2018-07-25: 300 mg via ORAL

## 2018-07-25 MED ORDER — LIDOCAINE HCL (PF) 2 % IJ SOLN
INTRAMUSCULAR | Status: AC
  Filled 2018-07-25: qty 10

## 2018-07-25 SURGICAL SUPPLY — 49 items
BINDER BREAST LRG (GAUZE/BANDAGES/DRESSINGS) ×3 IMPLANT
BINDER BREAST XLRG (GAUZE/BANDAGES/DRESSINGS) ×3 IMPLANT
BULB RESERV EVAC DRAIN JP 100C (MISCELLANEOUS) ×4 IMPLANT
CANISTER SUCT 1200ML W/VALVE (MISCELLANEOUS) ×3 IMPLANT
CHLORAPREP W/TINT 26ML (MISCELLANEOUS) ×3 IMPLANT
CNTNR SPEC 2.5X3XGRAD LEK (MISCELLANEOUS) ×4
CONT SPEC 4OZ STER OR WHT (MISCELLANEOUS) ×8
CONTAINER SPEC 2.5X3XGRAD LEK (MISCELLANEOUS) ×4 IMPLANT
COVER WAND RF STERILE (DRAPES) ×3 IMPLANT
DERMABOND ADVANCED (GAUZE/BANDAGES/DRESSINGS) ×4
DERMABOND ADVANCED .7 DNX12 (GAUZE/BANDAGES/DRESSINGS) ×1 IMPLANT
DRAIN CHANNEL JP 15F RND 16 (MISCELLANEOUS) IMPLANT
DRAIN CHANNEL JP 19F (MISCELLANEOUS) ×4 IMPLANT
DRAPE LAPAROTOMY TRNSV 106X77 (MISCELLANEOUS) ×3 IMPLANT
DRAPE SHEET LG 3/4 BI-LAMINATE (DRAPES) ×2 IMPLANT
DRSG GAUZE FLUFF 36X18 (GAUZE/BANDAGES/DRESSINGS) ×4 IMPLANT
DRSG TEGADERM 2-3/8X2-3/4 SM (GAUZE/BANDAGES/DRESSINGS) ×6 IMPLANT
ELECT BLADE 6.5 EXT (BLADE) IMPLANT
ELECT CAUTERY BLADE 6.4 (BLADE) ×3 IMPLANT
ELECT REM PT RETURN 9FT ADLT (ELECTROSURGICAL) ×3
ELECTRODE REM PT RTRN 9FT ADLT (ELECTROSURGICAL) ×1 IMPLANT
GAUZE 4X4 16PLY RFD (DISPOSABLE) ×4 IMPLANT
GAUZE FLUFF 18X24 1PLY STRL (GAUZE/BANDAGES/DRESSINGS) ×3 IMPLANT
GAUZE SPONGE 4X4 12PLY STRL (GAUZE/BANDAGES/DRESSINGS) ×2 IMPLANT
GLOVE BIOGEL PI IND STRL 7.5 (GLOVE) ×1 IMPLANT
GLOVE BIOGEL PI INDICATOR 7.5 (GLOVE) ×2
GLOVE SURG SYN 7.0 (GLOVE) ×3 IMPLANT
GLOVE SURG SYN 7.0 PF PI (GLOVE) ×1 IMPLANT
GOWN STRL REUS W/ TWL LRG LVL3 (GOWN DISPOSABLE) ×2 IMPLANT
GOWN STRL REUS W/TWL LRG LVL3 (GOWN DISPOSABLE) ×4
HEMOSTAT ARISTA ABSORB 3G PWDR (HEMOSTASIS) ×2 IMPLANT
LABEL OR SOLS (LABEL) ×3 IMPLANT
NEEDLE HYPO 22GX1.5 SAFETY (NEEDLE) ×6 IMPLANT
PACK BASIN MAJOR ARMC (MISCELLANEOUS) ×3 IMPLANT
SLEVE PROBE SENORX GAMMA FIND (MISCELLANEOUS) ×3 IMPLANT
SUT ETHILON 3-0 FS-10 30 BLK (SUTURE) ×6
SUT MNCRL 4-0 (SUTURE) ×6
SUT MNCRL 4-0 27XMFL (SUTURE) ×3
SUT VIC AB 2-0 CT1 (SUTURE) ×6 IMPLANT
SUT VIC AB 2-0 SH 27 (SUTURE) ×4
SUT VIC AB 2-0 SH 27XBRD (SUTURE) IMPLANT
SUT VIC AB 3-0 SH 27 (SUTURE)
SUT VIC AB 3-0 SH 27X BRD (SUTURE) IMPLANT
SUTURE EHLN 3-0 FS-10 30 BLK (SUTURE) IMPLANT
SUTURE MNCRL 4-0 27XMF (SUTURE) ×2 IMPLANT
SYR 10ML LL (SYRINGE) ×3 IMPLANT
SYR 30ML LL (SYRINGE) ×6 IMPLANT
SYR BULB IRRIG 60ML STRL (SYRINGE) ×3 IMPLANT
TAPE TRANSPORE STRL 2 31045 (GAUZE/BANDAGES/DRESSINGS) ×2 IMPLANT

## 2018-07-25 NOTE — Transfer of Care (Signed)
Immediate Anesthesia Transfer of Care Note  Patient: Connie Mitchell  Procedure(s) Performed: MASTECTOMY WITH SENTINEL LYMPH NODE BIOPSY (Left Breast)  Patient Location: PACU  Anesthesia Type:General  Level of Consciousness: drowsy  Airway & Oxygen Therapy: Patient Spontanous Breathing and Patient connected to face mask oxygen  Post-op Assessment: Report given to RN and Post -op Vital signs reviewed and stable  Post vital signs: Reviewed and stable  Last Vitals:  Vitals Value Taken Time  BP 141/91 07/25/2018  5:33 PM  Temp 36.7 C 07/25/2018  5:33 PM  Pulse 70 07/25/2018  5:37 PM  Resp 20 07/25/2018  5:37 PM  SpO2 100 % 07/25/2018  5:37 PM  Vitals shown include unvalidated device data.  Last Pain:  Vitals:   07/25/18 1029  TempSrc: Oral  PainSc: 2          Complications: No apparent anesthesia complications

## 2018-07-25 NOTE — Interval H&P Note (Signed)
History and Physical Interval Note:  07/25/2018 12:38 PM  Connie Mitchell  has presented today for surgery, with the diagnosis of left breast cancer  The various methods of treatment have been discussed with the patient and family. After consideration of risks, benefits and other options for treatment, the patient has consented to  Procedure(s): MASTECTOMY WITH SENTINEL LYMPH NODE BIOPSY (Left) as a surgical intervention .  The patient's history has been reviewed, patient examined, no change in status, stable for surgery.  I have reviewed the patient's chart and labs.  Questions were answered to the patient's satisfaction.     Daeja Helderman

## 2018-07-25 NOTE — Anesthesia Post-op Follow-up Note (Signed)
Anesthesia QCDR form completed.        

## 2018-07-25 NOTE — Anesthesia Preprocedure Evaluation (Signed)
Anesthesia Evaluation  Patient identified by MRN, date of birth, ID band Patient awake    Reviewed: Allergy & Precautions, NPO status , Patient's Chart, lab work & pertinent test results  History of Anesthesia Complications Negative for: history of anesthetic complications  Airway Mallampati: III  TM Distance: >3 FB Neck ROM: Full    Dental no notable dental hx.    Pulmonary neg pulmonary ROS, neg sleep apnea, neg COPD,    breath sounds clear to auscultation- rhonchi (-) wheezing      Cardiovascular Exercise Tolerance: Good (-) hypertension(-) CAD, (-) Past MI, (-) Cardiac Stents and (-) CABG  Rhythm:Regular Rate:Normal - Systolic murmurs and - Diastolic murmurs Echo 06/20/08: - Left ventricle: The cavity size was normal. Wall thickness was   normal. Systolic function was normal. The estimated ejection   fraction was in the range of 60% to 65%. Wall motion was normal;   there were no regional wall motion abnormalities.   Neuro/Psych neg Seizures negative neurological ROS  negative psych ROS   GI/Hepatic Neg liver ROS, GERD  ,  Endo/Other  negative endocrine ROSneg diabetes  Renal/GU negative Renal ROS     Musculoskeletal  (+) Arthritis ,   Abdominal (+) + obese,   Peds  Hematology negative hematology ROS (+)   Anesthesia Other Findings Past Medical History: 1991: Cancer ([place])     Comment:  left breast. treated with rads and lumpectomy 2020: Cancer ([place])     Comment:  newly diagnosed, also left breast 01/2018: Cellulitis and abscess of toe of left foot No date: GERD (gastroesophageal reflux disease) 1991: Hx of therapeutic radiation     Comment:  left breast No date: Laryngopharyngeal reflux     Comment:  sometimes food goes down wrong way and she ends up in               extreme coughing/choking scenario 1991: Personal history of radiation therapy     Comment:  left breast ca No date: Primary  osteoarthritis No date: Thoracic aortic aneurysm (TAA) ([facility])   Reproductive/Obstetrics                             Anesthesia Physical Anesthesia Plan  ASA: III  Anesthesia Plan: General   Post-op Pain Management:    Induction: Intravenous  PONV Risk Score and Plan: 2 and Ondansetron and Dexamethasone  Airway Management Planned: LMA  Additional Equipment:   Intra-op Plan:   Post-operative Plan:   Informed Consent: I have reviewed the patients History and Physical, chart, labs and discussed the procedure including the risks, benefits and alternatives for the proposed anesthesia with the patient or authorized representative who has indicated his/her understanding and acceptance.     Dental advisory given  Plan Discussed with: CRNA and Anesthesiologist  Anesthesia Plan Comments:         Anesthesia Quick Evaluation

## 2018-07-25 NOTE — Anesthesia Procedure Notes (Signed)
Procedure Name: LMA Insertion Date/Time: 07/25/2018 1:12 PM Performed by: Jonna Clark, CRNA Pre-anesthesia Checklist: Patient identified, Patient being monitored, Timeout performed, Emergency Drugs available and Suction available Patient Re-evaluated:Patient Re-evaluated prior to induction Oxygen Delivery Method: Circle system utilized Preoxygenation: Pre-oxygenation with 100% oxygen Induction Type: IV induction Ventilation: Mask ventilation without difficulty LMA: LMA inserted LMA Size: 3.5 Tube type: Oral Number of attempts: 1 Placement Confirmation: positive ETCO2 and breath sounds checked- equal and bilateral Tube secured with: Tape Dental Injury: Teeth and Oropharynx as per pre-operative assessment

## 2018-07-25 NOTE — Op Note (Signed)
  Procedure Date:  07/25/2018  Pre-operative Diagnosis:  Left breast cancer  Post-operative Diagnosis:  Left breast cancer  Procedure:  Left simple mastectomy and sentinel lymph node biopsy  Surgeon:  Melvyn Neth, MD  Assistant:  Edison Simon, PA-C  Anesthesia:  General endotracheal  Estimated Blood Loss:  50 ml  Specimens:  Left breast;  Sentinel nodes 1-4  Complications:  None  Indications for Procedure:  This is a 85 y.o. female who presents with left breast cancer, with prior history of left breast cancer many years ago s/p lumpectomy and radiation.  The risks of bleeding, infection, injury to surrounding structures, hematoma, seroma, open wound, cosmetic deformity, and the need for further surgery were all discussed with the patient and was willing to proceed.  Prior to this procedure, the patient had undergone sentinel lymphoscintigraphy.  Description of Procedure: The patient was correctly identified in the preoperative area and brought into the operating room.  The patient was placed supine with VTE prophylaxis in place.  Appropriate time-outs were performed.  Anesthesia was induced and the patient was intubated.  Appropriate antibiotics were infused.  A visual dye was injected in the left periareolar region under aseptic conditions. The left chest and axilla were prepped and draped in usual sterile fashion.  Then using the hand-held probe an area of high counts was identified in the axilla, and a 5 cm incision was made.  Cautery was used to dissect down the subcutaneous tissue and the hand-held probe was used to guide dissection. A hot and blue lymph node was identified and resected.  This had a count of 845.  Additional lymph nodes were identified and resected with counts of 42 for two nodes and 22 for last node.  These last three nodes were not blue.  The cavity was irrigated and hemostasis was assured with electrocautery.  Local anesthetic was infiltrated into the skin and  subcutaneous tissue of the cavity.  The wound was then closed in two layers with 3-0 Vicryl and 4-0 Monocryl and sealed with DermaBond.  An elliptical incision was then made over the breast encompassing the nipple-areolar complex.  Using electrocautery, subcutaneous flaps were created superiorly to the clavicle, inferiorly to the inframammary fold, medially to the sternum, and laterally to the latissimus dorsi with careful attention to create flaps of adequate thickness.  The breast tissue was then dissected off the pectoralis fascia as the deep margin.  2-0 silk suture was used to mark the specimen as short superior and long lateral.  An additional lateral skin was resected in order to make the closure not have a dog ear. The cavity was then irrigated and hemostasis was assured with electrocautery.  3 gm of Arista was spread over the wound bed.  60 ml of Exparel solution was infiltrated into the skin and subcutaneous tissue of the cavity.  Two 59 Fr Blake drains were placed via two lateral stab incisions to drain the mastectomy wound bed.  The incision was then closed in three layers with 2-0 Vicryl, 3-0 Vicryl and 4-0 Monocryl and sealed with DermaBond.  The wound was dressed with fluff gauze and a breast binder was placed.  The patient was emerged from anesthesia and extubated and brought to the recovery room for further management.  The patient tolerated the procedure well and all counts were correct at the end of the case.   Melvyn Neth, MD

## 2018-07-26 ENCOUNTER — Encounter: Payer: Self-pay | Admitting: Surgery

## 2018-07-26 DIAGNOSIS — Z923 Personal history of irradiation: Secondary | ICD-10-CM | POA: Diagnosis not present

## 2018-07-26 DIAGNOSIS — Z803 Family history of malignant neoplasm of breast: Secondary | ICD-10-CM | POA: Diagnosis not present

## 2018-07-26 DIAGNOSIS — C50412 Malignant neoplasm of upper-outer quadrant of left female breast: Secondary | ICD-10-CM | POA: Diagnosis not present

## 2018-07-26 DIAGNOSIS — M1991 Primary osteoarthritis, unspecified site: Secondary | ICD-10-CM | POA: Diagnosis not present

## 2018-07-26 DIAGNOSIS — Z170 Estrogen receptor positive status [ER+]: Secondary | ICD-10-CM | POA: Diagnosis not present

## 2018-07-26 DIAGNOSIS — K219 Gastro-esophageal reflux disease without esophagitis: Secondary | ICD-10-CM | POA: Diagnosis not present

## 2018-07-26 LAB — CBC WITH DIFFERENTIAL/PLATELET
Abs Immature Granulocytes: 0.05 10*3/uL (ref 0.00–0.07)
Basophils Absolute: 0 10*3/uL (ref 0.0–0.1)
Basophils Relative: 0 %
EOS ABS: 0 10*3/uL (ref 0.0–0.5)
EOS PCT: 0 %
HCT: 37.5 % (ref 36.0–46.0)
Hemoglobin: 12.4 g/dL (ref 12.0–15.0)
Immature Granulocytes: 1 %
Lymphocytes Relative: 8 %
Lymphs Abs: 0.8 10*3/uL (ref 0.7–4.0)
MCH: 30.7 pg (ref 26.0–34.0)
MCHC: 33.1 g/dL (ref 30.0–36.0)
MCV: 92.8 fL (ref 80.0–100.0)
Monocytes Absolute: 0.3 10*3/uL (ref 0.1–1.0)
Monocytes Relative: 3 %
Neutro Abs: 9.2 10*3/uL — ABNORMAL HIGH (ref 1.7–7.7)
Neutrophils Relative %: 88 %
PLATELETS: UNDETERMINED 10*3/uL (ref 150–400)
RBC: 4.04 MIL/uL (ref 3.87–5.11)
RDW: 14.7 % (ref 11.5–15.5)
WBC: 10.3 10*3/uL (ref 4.0–10.5)
nRBC: 0 % (ref 0.0–0.2)

## 2018-07-26 LAB — BASIC METABOLIC PANEL
Anion gap: 5 (ref 5–15)
BUN: 19 mg/dL (ref 8–23)
CALCIUM: 8.1 mg/dL — AB (ref 8.9–10.3)
CO2: 26 mmol/L (ref 22–32)
Chloride: 106 mmol/L (ref 98–111)
Creatinine, Ser: 0.63 mg/dL (ref 0.44–1.00)
GFR calc Af Amer: >60 mL/min (ref >60)
GFR calc non Af Amer: >60 mL/min (ref >60)
Glucose, Bld: 127 mg/dL — ABNORMAL HIGH (ref 70–99)
Potassium: 4 mmol/L (ref 3.5–5.1)
Sodium: 137 mmol/L (ref 135–145)

## 2018-07-26 LAB — HEMOGLOBIN AND HEMATOCRIT, BLOOD
HCT: 36.8 % (ref 36.0–46.0)
Hemoglobin: 12.2 g/dL (ref 12.0–15.0)

## 2018-07-26 LAB — MAGNESIUM: Magnesium: 2.1 mg/dL (ref 1.7–2.4)

## 2018-07-26 MED ORDER — OXYCODONE HCL 5 MG PO TABS: 5.0000 mg | ORAL_TABLET | ORAL | 0 refills | Status: DC | PRN

## 2018-07-26 MED ORDER — IBUPROFEN 800 MG PO TABS: 800.0000 mg | ORAL_TABLET | Freq: Three times a day (TID) | ORAL | 0 refills | Status: DC | PRN

## 2018-07-26 NOTE — Discharge Summary (Signed)
[facility] SURGICAL DISCHARGE SUMMARY  Patient ID: [name] MRN: [id number] DOB/AGE: July 20, 1933 85 y.o.  Admit date: 07/25/2018 Discharge date: 07/26/2018  Discharge Diagnoses Breast Cancer (Left)  Consultants None  Procedures 07/25/2018:  Left simple mastectomy and sentinel lymph node biopsy  HPI: Connie Mitchell is a 85 y.o. female with a history of left breast cancer who presents to [facility] 07/25/2018 for Left simple mastectomy and sentinel lymph node biopsy with Dr Olean Ree,, [place] [facility] Course: Informed consent was obtained and documented, and patient underwent uneventful Left simple mastectomy and sentinel lymph node biopsy (Dr Hampton Abbot, MD, 07/25/2018).  Post-operatively, patient's pain improved/resolved, hemoglobin remained stable, and advancement of patient's diet and ambulation were well-tolerated. The remainder of patient's hospital course was essentially unremarkable, and discharge planning was initiated accordingly with patient safely able to be discharged home with appropriate discharge instructions, pain control, and outpatient follow-up after all of her questions were answered her expressed satisfaction.  Discharge Condition: Good   Physical Examination:  Constitutional: Well appearing female, NAD Pulmonary: Normal effort, no respiratory distress Chest: Mastectomy incision is CDI, some surrounding ecchymosis, no erythema. Axillary incision is CDI. JP drains in left chest wall with serosanguinous-bloody drainage.     Allergies as of 07/26/2018      Reactions   Ciprofloxacin Nausea And Vomiting      Medication List    TAKE these medications   ibuprofen 800 MG tablet Commonly known as:  ADVIL,MOTRIN Take 1 tablet (800 mg total) by mouth every 8 (eight) hours as needed.   loratadine 10 MG dissolvable tablet Commonly known as:  CLARITIN REDITABS Take 10 mg by mouth at bedtime.   montelukast 10 MG tablet Commonly known as:   SINGULAIR TAKE 1 TABLET BY MOUTH EVERYDAY AT BEDTIME What changed:  See the new instructions.   omeprazole 40 MG capsule Commonly known as:  PRILOSEC Take 1 capsule (40 mg total) by mouth daily.   oxyCODONE 5 MG immediate release tablet Commonly known as:  Oxy IR/ROXICODONE Take 1 tablet (5 mg total) by mouth every 4 (four) hours as needed for severe pain or breakthrough pain.   Papaya Enzyme Chew Chew 2 each by mouth 3 (three) times daily with meals.        Follow-up Information    Piscoya, Jacqulyn Bath, MD. Schedule an appointment as soon as possible for a visit in 1 week(s).   Specialty:  General Surgery Why:  s/p left mastectomy and SLNB Contact information: [address] [place] [address] [phone]            -- Zachary Schulz , PA-C [facility]  07/26/2018, 3:21 PM [phone] M-F: 7am - 4pm

## 2018-07-26 NOTE — Progress Notes (Signed)
   07/26/18 1000  Clinical Encounter Type  Visited With Patient  Visit Type Initial  Referral From Nurse  Spiritual Encounters  Spiritual Needs Emotional  Ch responded to a call requesting AD education on the patient. Pt already had a copy at home and was planning to finalize it at a local bank. Ch comes from a family of ministers. Ch's father was an Hydrologist. Pt shared fond memories of his family, especially her father. 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

## 2018-07-26 NOTE — Care Management Obs Status (Signed)
[place] NOTIFICATION   Patient Details  Name: Connie Mitchell MRN: [id number] Date of Birth: 01-12-1934   Medicare Observation Status Notification Given:  Yes    Elza Rafter, RN 07/26/2018, 3:09 PM

## 2018-07-26 NOTE — Progress Notes (Signed)
Discharge order received. Patient is alert and oriented. Vital signs stable . No signs of acute distress. Discharge instructions given. Patient verbalized understanding. No other issues noted at this time.   

## 2018-07-26 NOTE — Care Management Note (Signed)
Case Management Note  Patient Details  Name: Connie Mitchell MRN: [id number] Date of Birth: 1933/08/25  Subjective/Objective:   Patient placed in observation for schedules lt mastectomy.  Presented MOON letter.  Independent in all adls, denies issues accessing medical care, obtaining medications or with transportation.  Current with PCP.  No discharge needs identified at present by care manager or members of care team                   Action/Plan:   Expected Discharge Date:                  Expected Discharge Plan:  Home/Self Care  In-House Referral:     Discharge planning Services  CM Consult  Post Acute Care Choice:    Choice offered to:     DME Arranged:    DME Agency:     HH Arranged:    [place] Agency:     Status of Service:  Completed, signed off  If discussed at [employer], dates discussed:    Additional Comments:  Elza Rafter, RN 07/26/2018, 3:11 PM

## 2018-07-27 DIAGNOSIS — I712 Thoracic aortic aneurysm, without rupture: Secondary | ICD-10-CM | POA: Diagnosis not present

## 2018-07-27 DIAGNOSIS — M150 Primary generalized (osteo)arthritis: Secondary | ICD-10-CM | POA: Diagnosis not present

## 2018-07-27 DIAGNOSIS — C50412 Malignant neoplasm of upper-outer quadrant of left female breast: Secondary | ICD-10-CM | POA: Diagnosis not present

## 2018-07-27 DIAGNOSIS — M810 Age-related osteoporosis without current pathological fracture: Secondary | ICD-10-CM | POA: Diagnosis not present

## 2018-07-27 DIAGNOSIS — Z9012 Acquired absence of left breast and nipple: Secondary | ICD-10-CM | POA: Diagnosis not present

## 2018-07-27 DIAGNOSIS — Z4801 Encounter for change or removal of surgical wound dressing: Secondary | ICD-10-CM | POA: Diagnosis not present

## 2018-07-27 DIAGNOSIS — Z96653 Presence of artificial knee joint, bilateral: Secondary | ICD-10-CM | POA: Diagnosis not present

## 2018-07-27 DIAGNOSIS — Z483 Aftercare following surgery for neoplasm: Secondary | ICD-10-CM | POA: Diagnosis not present

## 2018-07-27 DIAGNOSIS — M5136 Other intervertebral disc degeneration, lumbar region: Secondary | ICD-10-CM | POA: Diagnosis not present

## 2018-07-27 LAB — SURGICAL PATHOLOGY

## 2018-07-28 ENCOUNTER — Telehealth: Payer: Self-pay | Admitting: *Deleted

## 2018-07-28 NOTE — Telephone Encounter (Signed)
Patient called and stated that she had a left breast mastectomy with Dr.Piscoya on 07/25/18 and she woke up this morning with numbness in her right leg. Please call and advise

## 2018-07-28 NOTE — Telephone Encounter (Signed)
Spoke with the patient and she states that she woke up with mild numbness in the right leg from her mid calf to the top of the thigh. She denies any skin discoloration, pain, or any swelling in the leg. She does report she has a lot of back problems. Patient instructed to monitor and if no improvement or her symptoms worsen to notify her neurologist or her primary care doctor. No current symptoms of a blood clot present. Patient instructed on these signs to watch out for.

## 2018-07-28 NOTE — Anesthesia Postprocedure Evaluation (Signed)
Anesthesia Post Note  Patient: Connie Mitchell  Procedure(s) Performed: MASTECTOMY WITH SENTINEL LYMPH NODE BIOPSY (Left Breast)  Patient location during evaluation: PACU Anesthesia Type: General Level of consciousness: awake and alert and oriented Pain management: pain level controlled Vital Signs Assessment: post-procedure vital signs reviewed and stable Respiratory status: spontaneous breathing Cardiovascular status: blood pressure returned to baseline Anesthetic complications: no     Last Vitals:  Vitals:   07/26/18 0416 07/26/18 1338  BP: 101/70 110/65  Pulse: 73 63  Resp: 16 16  Temp: 36.5 C 37.5 C  SpO2: 95% 97%    Last Pain:  Vitals:   07/26/18 1338  TempSrc: Oral  PainSc:                  Thomes Burak

## 2018-07-29 DIAGNOSIS — Z483 Aftercare following surgery for neoplasm: Secondary | ICD-10-CM | POA: Diagnosis not present

## 2018-07-29 DIAGNOSIS — I712 Thoracic aortic aneurysm, without rupture: Secondary | ICD-10-CM | POA: Diagnosis not present

## 2018-07-29 DIAGNOSIS — C50412 Malignant neoplasm of upper-outer quadrant of left female breast: Secondary | ICD-10-CM | POA: Diagnosis not present

## 2018-07-29 DIAGNOSIS — Z4801 Encounter for change or removal of surgical wound dressing: Secondary | ICD-10-CM | POA: Diagnosis not present

## 2018-07-29 DIAGNOSIS — M150 Primary generalized (osteo)arthritis: Secondary | ICD-10-CM | POA: Diagnosis not present

## 2018-07-29 DIAGNOSIS — Z9012 Acquired absence of left breast and nipple: Secondary | ICD-10-CM | POA: Diagnosis not present

## 2018-08-01 ENCOUNTER — Other Ambulatory Visit: Payer: Self-pay

## 2018-08-01 ENCOUNTER — Encounter: Payer: Self-pay | Admitting: Internal Medicine

## 2018-08-01 ENCOUNTER — Ambulatory Visit (INDEPENDENT_AMBULATORY_CARE_PROVIDER_SITE_OTHER): Payer: Medicare Other | Admitting: Internal Medicine

## 2018-08-01 VITALS — BP 118/68 | HR 66 | Ht 63.0 in | Wt 175.0 lb

## 2018-08-01 DIAGNOSIS — M5431 Sciatica, right side: Secondary | ICD-10-CM

## 2018-08-01 DIAGNOSIS — M7071 Other bursitis of hip, right hip: Secondary | ICD-10-CM

## 2018-08-01 DIAGNOSIS — M7989 Other specified soft tissue disorders: Secondary | ICD-10-CM

## 2018-08-01 MED ORDER — PREDNISONE 10 MG PO TABS: 10.0000 mg | ORAL_TABLET | ORAL | 0 refills | Status: AC

## 2018-08-01 NOTE — Progress Notes (Signed)
Date:  08/01/2018   Name:  Connie Mitchell   DOB:  Dec 04, 1933   MRN:  [id number]   Chief Complaint: Leg Pain (Patient said she has her masectomy on the 10th. After surgery she started having sharp shooting pains down into her R) leg. Says sometimes it feels numb, and burning. Before surgery she stepped down out of the care with a numb foot and found out at ER she had a stress fracture of her foot. She is unsure if this is related or not.) Foot pain - suspected stress fracture on the left foot about 2 weeks ago. She had stepped wrong out of the car and felt pain and then had discoloration and swelling.  She did not wear a boot, just her usual shoe and now that is better.  Leg Pain   There was no injury mechanism. The pain is present in the right hip. The quality of the pain is described as burning and cramping. The pain is moderate (worst at night). Associated symptoms include numbness (and burning sensation down right leg). Exacerbated by: lying down. She has tried NSAIDs for the symptoms. The treatment provided mild relief.  She may have been walking differently when her foot was swollen.  She denies any fall, heavy lifting, twisting, etc.  Breast cancer - recent surgery with left mastectomy. Still has drains but hopes to get them out tomorrow.  She feels that she is healing with minimal pain.  Review of Systems  Constitutional: Negative for chills, fatigue and fever.  Respiratory: Negative for chest tightness, shortness of breath and wheezing.   Cardiovascular: Positive for leg swelling. Negative for chest pain and palpitations.  Musculoskeletal: Positive for arthralgias, back pain and gait problem.  Neurological: Positive for numbness (and burning sensation down right leg). Negative for dizziness and headaches.    Patient Active Problem List   Diagnosis Date Noted  . Breast cancer, left ([place]) 07/25/2018  . Osteopenia of neck of right femur 07/12/2018  . Malignant neoplasm of upper-outer  quadrant of left female breast ([place]) 07/07/2018  . Thoracic ascending aortic aneurysm ([place]) 05/27/2018  . LPRD (laryngopharyngeal reflux disease) 03/17/2018  . Post-nasal drip 03/17/2018  . Allergic cough 11/25/2017  . Arthritis involving multiple sites 11/25/2017  . Chronic foot pain 11/25/2017  . Lumbar degenerative disc disease 09/24/2017  . Hx of Lyme disease 09/24/2017  . Hx of basal cell carcinoma 09/24/2017  . Elevated blood pressure reading 09/24/2017  . Nuclear sclerotic cataract of left eye 08/09/2015  . Osteoporosis 07/04/2014  . Primary localized osteoarthrosis of shoulder region 12/13/2012  . History of breast cancer 12/18/2011  . Osteoarthritis 12/18/2011    Allergies  Allergen Reactions  . Ciprofloxacin Nausea And Vomiting    Past Surgical History:  Procedure Laterality Date  . BREAST LUMPECTOMY Left 1991   breast ca, lumpectomy  . cataracts Bilateral   . CYSTOCELE REPAIR  2010  . EYE SURGERY Bilateral 2017   cataracts extracted  . JOINT REPLACEMENT    . MASTECTOMY W/ SENTINEL NODE BIOPSY Left 07/25/2018   Procedure: MASTECTOMY WITH SENTINEL LYMPH NODE BIOPSY;  Surgeon: Olean Ree, MD;  Location: [facility] ORS;  Service: General;  Laterality: Left;  . OOPHORECTOMY  1995   ovarian mass - benign  . REPLACEMENT TOTAL KNEE Bilateral 2012,2013  . SKIN BIOPSY Left    arm growth  . TONSILLECTOMY  1958  . TOTAL SHOULDER ARTHROPLASTY Right 2016    Social History   Tobacco Use  . Smoking  status: Never Smoker  . Smokeless tobacco: Never Used  . Tobacco comment: smoking cessation materials not required  Substance Use Topics  . Alcohol use: Yes    Comment: occasional  . Drug use: Never     Medication list has been reviewed and updated.  Current Meds  Medication Sig  . ibuprofen (ADVIL,MOTRIN) 800 MG tablet Take 1 tablet (800 mg total) by mouth every 8 (eight) hours as needed.  . loratadine (CLARITIN REDITABS) 10 MG dissolvable tablet Take 10 mg by mouth at  bedtime.  . montelukast (SINGULAIR) 10 MG tablet TAKE 1 TABLET BY MOUTH EVERYDAY AT BEDTIME (Patient taking differently: Take 10 mg by mouth at bedtime. )  . omeprazole (PRILOSEC) 40 MG capsule Take 1 capsule (40 mg total) by mouth daily.  Marland Kitchen oxyCODONE (OXY IR/ROXICODONE) 5 MG immediate release tablet Take 1 tablet (5 mg total) by mouth every 4 (four) hours as needed for severe pain or breakthrough pain.  . Papaya Enzyme CHEW Chew 2 each by mouth 3 (three) times daily with meals.     PHQ 2/9 Scores 08/01/2018 05/27/2018 01/21/2018 11/17/2017  PHQ - 2 Score 0 0 0 0  PHQ- 9 Score - - 0 0    Physical Exam Constitutional:      Appearance: Normal appearance.  Neck:     Musculoskeletal: Normal range of motion and neck supple.  Cardiovascular:     Rate and Rhythm: Normal rate and regular rhythm.  Pulmonary:     Effort: Pulmonary effort is normal.     Breath sounds: Normal breath sounds.  Musculoskeletal:     Right hip: She exhibits decreased range of motion and tenderness (lateral hip).     Lumbar back: She exhibits bony tenderness (over right SI joint). She exhibits no tenderness and no spasm.       Legs:     Comments: Negative homan's sign Mild tenderness posterior right calf Mildly warm, not red  Lymphadenopathy:     Cervical: No cervical adenopathy.  Neurological:     Mental Status: She is alert.     BP 118/68   Pulse 66   Ht 5\' 3"  (1.6 m)   Wt 175 lb (79.4 kg)   SpO2 96%   BMI 31.00 kg/m   Assessment and Plan: 1. Bursitis of right hip, unspecified bursa Continue ice or heat, positioning - predniSONE (DELTASONE) 10 MG tablet; Take 1 tablet (10 mg total) by mouth as directed for 6 days. Take 6,5,4,3,2,1 then stop  Dispense: 21 tablet; Refill: 0  2. Sciatica of right side Chronic, recurrent May be exacerbated by recent surgery - predniSONE (DELTASONE) 10 MG tablet; Take 1 tablet (10 mg total) by mouth as directed for 6 days. Take 6,5,4,3,2,1 then stop  Dispense: 21 tablet;  Refill: 0  3. Right leg swelling Need to rule out DVT - US Venous Img Lower Unilateral Right   Partially dictated using Editor, commissioning. Any errors are unintentional.  Halina Maidens, MD [facility]  08/01/2018

## 2018-08-02 ENCOUNTER — Encounter: Payer: Self-pay | Admitting: Surgery

## 2018-08-02 ENCOUNTER — Telehealth: Payer: Self-pay

## 2018-08-02 ENCOUNTER — Ambulatory Visit
Admission: RE | Admit: 2018-08-02 | Discharge: 2018-08-02 | Disposition: A | Discharge status: Home / Self Care | Payer: Medicare Other | Source: Ambulatory Visit | Attending: Internal Medicine | Admitting: Internal Medicine

## 2018-08-02 ENCOUNTER — Other Ambulatory Visit: Payer: Self-pay

## 2018-08-02 ENCOUNTER — Ambulatory Visit (INDEPENDENT_AMBULATORY_CARE_PROVIDER_SITE_OTHER): Payer: Medicare Other | Admitting: Surgery

## 2018-08-02 VITALS — BP 131/77 | HR 67 | Temp 97.3°F | Resp 16 | Ht 63.0 in | Wt 175.0 lb

## 2018-08-02 DIAGNOSIS — C50412 Malignant neoplasm of upper-outer quadrant of left female breast: Secondary | ICD-10-CM

## 2018-08-02 DIAGNOSIS — Z09 Encounter for follow-up examination after completed treatment for conditions other than malignant neoplasm: Secondary | ICD-10-CM

## 2018-08-02 DIAGNOSIS — Z170 Estrogen receptor positive status [ER+]: Secondary | ICD-10-CM

## 2018-08-02 DIAGNOSIS — M7989 Other specified soft tissue disorders: Secondary | ICD-10-CM | POA: Diagnosis not present

## 2018-08-02 NOTE — Patient Instructions (Addendum)
Please see your follow up appointment listed below.   You will need to clean around the drain site daily.  Remove all old dressings and replace with new gauze and apply tape to secure.  Please continue to record the drain output.   I will call you later today and let you know when your appointment with Dr.Cocoran.

## 2018-08-02 NOTE — Telephone Encounter (Signed)
Anderson Malta from [place] called stating she is calling about results for patient [place]. She said patient was negative for DVT. Told patient she can leave and we will call her with further instructions.  Please Advise.

## 2018-08-02 NOTE — Progress Notes (Signed)
08/02/2018  HPI: Connie Mitchell is a 85 y.o. female s/p left  Mastectomy and sentinel node biopsy on 07/25/18.  Two drains left in place.  Presents for follow up.  Pathology discussed with her last week, showing negative lymph nodes, and full resection of the mass with the mastectomy.  Patient reports that the drains are still having about 45 ml / day output.  Denies any significant pain and takes only ibuprofen.  Vital signs: BP 131/77   Pulse 67   Temp (!) 97.3 F (36.3 C) (Oral)   Resp 16   Ht 5\' 3"  (1.6 m)   Wt 175 lb (79.4 kg)   SpO2 98%   BMI 31.00 kg/m    Physical Exam: Constitutional:  No acute distress Breast:  Left mastectomy site with two drain in place, serosanguinous.  Incisions are clean, dry, intact.  There is mild ecchymosis around the mastectomy site, and mild amount of blue dye at the mid portion of the incision from her sentinel node injection.  Assessment/Plan: This is a 85 y.o. female s/p left mastectomy and sentinel node biopsy.  --Follow up next week for removal of drains. --Will write down instructions for dressing changes for home health RN.  Dry gauze dressing around each drain, to be changed daily.  Also, dry gauze fluffed up for cushioning over the mastectomy incision, to be changed daily. --Will make appointment with Dr. Mike Gip so patient can discuss pathology results with her and determine adjuvant therapy.   Melvyn Neth, [facility]

## 2018-08-02 NOTE — Telephone Encounter (Signed)
Left message for cancer center to call me regarding scheduling a follow up with Dr.Corcoran.in one week.

## 2018-08-02 NOTE — Telephone Encounter (Signed)
Patient notified of 08/09/2018 appointment with Dr.Corcoran @ 9:15 am.

## 2018-08-03 DIAGNOSIS — Z9012 Acquired absence of left breast and nipple: Secondary | ICD-10-CM | POA: Diagnosis not present

## 2018-08-03 DIAGNOSIS — I712 Thoracic aortic aneurysm, without rupture: Secondary | ICD-10-CM | POA: Diagnosis not present

## 2018-08-03 DIAGNOSIS — C50412 Malignant neoplasm of upper-outer quadrant of left female breast: Secondary | ICD-10-CM | POA: Diagnosis not present

## 2018-08-03 DIAGNOSIS — M150 Primary generalized (osteo)arthritis: Secondary | ICD-10-CM | POA: Diagnosis not present

## 2018-08-03 DIAGNOSIS — Z483 Aftercare following surgery for neoplasm: Secondary | ICD-10-CM | POA: Diagnosis not present

## 2018-08-03 DIAGNOSIS — Z4801 Encounter for change or removal of surgical wound dressing: Secondary | ICD-10-CM | POA: Diagnosis not present

## 2018-08-05 ENCOUNTER — Encounter: Payer: Medicare Other | Admitting: Surgery

## 2018-08-05 ENCOUNTER — Ambulatory Visit (INDEPENDENT_AMBULATORY_CARE_PROVIDER_SITE_OTHER): Payer: Medicare Other | Admitting: Surgery

## 2018-08-05 ENCOUNTER — Encounter: Payer: Self-pay | Admitting: Surgery

## 2018-08-05 ENCOUNTER — Other Ambulatory Visit: Payer: Self-pay

## 2018-08-05 VITALS — BP 154/91 | HR 61 | Temp 98.8°F | Resp 16 | Ht 63.0 in | Wt 175.0 lb

## 2018-08-05 DIAGNOSIS — Z170 Estrogen receptor positive status [ER+]: Secondary | ICD-10-CM

## 2018-08-05 DIAGNOSIS — C50412 Malignant neoplasm of upper-outer quadrant of left female breast: Secondary | ICD-10-CM

## 2018-08-05 DIAGNOSIS — Z09 Encounter for follow-up examination after completed treatment for conditions other than malignant neoplasm: Secondary | ICD-10-CM

## 2018-08-05 NOTE — Progress Notes (Signed)
08/05/2018  HPI: Avalin Mitchell is a 85 y.o. female s/p left mastectomy and sentinel node biopsy.  She presents today for drain removal.  She was seen on 2/18 but her drains were still putting out more than 30 ml per day.  Today, her drains are less then 30 ml each, and today only 10 ml so far.  No issues with pain.  Vital signs: BP (!) 154/91   Pulse 61   Temp 98.8 F (37.1 C) (Temporal)   Resp 16   Ht 5\' 3"  (1.6 m)   Wt 175 lb (79.4 kg)   SpO2 98%   BMI 31.00 kg/m    Physical Exam: Constitutional: No acute distress Breast:  Left mastectomy scar continues to heal well with improving ecchymosis compared to 2/18.  No significant pain to palpation.  Left sentinel node biopsy site healing well.  Two drains in place with serosanguinous fluid.  Drain going to superior portion of mastectomy wound bed removed today without issues.    Assessment/Plan: This is a 85 y.o. female s/p left mastectomy and sentinel node biopsy.  --Left drain going superiorly was removed today.  No complications.  Dry gauze dressing applied. --Follow up on 2/25 for removal of the remaining drain.   Melvyn Neth, [facility]

## 2018-08-05 NOTE — Patient Instructions (Signed)
We removed drain 1 today. You will need to keep a dressing over the area until it heals completley.   We will remove drain 2 next week so continue to record the drain output.

## 2018-08-09 ENCOUNTER — Inpatient Hospital Stay: Payer: Medicare Other | Attending: Hematology and Oncology | Admitting: Hematology and Oncology

## 2018-08-09 ENCOUNTER — Ambulatory Visit (INDEPENDENT_AMBULATORY_CARE_PROVIDER_SITE_OTHER): Payer: Medicare Other | Admitting: Surgery

## 2018-08-09 ENCOUNTER — Encounter: Payer: Self-pay | Admitting: Hematology and Oncology

## 2018-08-09 ENCOUNTER — Inpatient Hospital Stay: Payer: Medicare Other

## 2018-08-09 ENCOUNTER — Encounter: Payer: Self-pay | Admitting: Surgery

## 2018-08-09 ENCOUNTER — Other Ambulatory Visit: Payer: Self-pay

## 2018-08-09 ENCOUNTER — Telehealth: Payer: Self-pay

## 2018-08-09 VITALS — BP 111/73 | HR 85 | Temp 98.5°F | Resp 18 | Ht 63.0 in | Wt 175.0 lb

## 2018-08-09 VITALS — BP 136/82 | HR 86 | Temp 95.7°F | Resp 16 | Ht 63.0 in | Wt 175.0 lb

## 2018-08-09 DIAGNOSIS — M85851 Other specified disorders of bone density and structure, right thigh: Secondary | ICD-10-CM

## 2018-08-09 DIAGNOSIS — Z170 Estrogen receptor positive status [ER+]: Secondary | ICD-10-CM

## 2018-08-09 DIAGNOSIS — C50412 Malignant neoplasm of upper-outer quadrant of left female breast: Secondary | ICD-10-CM

## 2018-08-09 LAB — CBC WITH DIFFERENTIAL/PLATELET
Abs Immature Granulocytes: 0.12 10*3/uL — ABNORMAL HIGH (ref 0.00–0.07)
Basophils Absolute: 0.1 10*3/uL (ref 0.0–0.1)
Basophils Relative: 0 %
Eosinophils Absolute: 0.3 10*3/uL (ref 0.0–0.5)
Eosinophils Relative: 3 %
HCT: 46.8 % — ABNORMAL HIGH (ref 36.0–46.0)
Hemoglobin: 15.9 g/dL — ABNORMAL HIGH (ref 12.0–15.0)
Immature Granulocytes: 1 %
Lymphocytes Relative: 19 %
Lymphs Abs: 2.3 10*3/uL (ref 0.7–4.0)
MCH: 31.3 pg (ref 26.0–34.0)
MCHC: 34 g/dL (ref 30.0–36.0)
MCV: 92.1 fL (ref 80.0–100.0)
Monocytes Absolute: 0.9 10*3/uL (ref 0.1–1.0)
Monocytes Relative: 8 %
Neutro Abs: 8.1 10*3/uL — ABNORMAL HIGH (ref 1.7–7.7)
Neutrophils Relative %: 69 %
Platelets: 213 10*3/uL (ref 150–400)
RBC: 5.08 MIL/uL (ref 3.87–5.11)
RDW: 15.1 % (ref 11.5–15.5)
WBC: 11.8 10*3/uL — ABNORMAL HIGH (ref 4.0–10.5)
nRBC: 0 % (ref 0.0–0.2)

## 2018-08-09 LAB — COMPREHENSIVE METABOLIC PANEL
ALT: 25 U/L (ref 0–44)
AST: 19 U/L (ref 15–41)
Albumin: 3.8 g/dL (ref 3.5–5.0)
Alkaline Phosphatase: 74 U/L (ref 38–126)
Anion gap: 10 (ref 5–15)
BUN: 25 mg/dL — ABNORMAL HIGH (ref 8–23)
CO2: 27 mmol/L (ref 22–32)
Calcium: 8.6 mg/dL — ABNORMAL LOW (ref 8.9–10.3)
Chloride: 98 mmol/L (ref 98–111)
Creatinine, Ser: 0.76 mg/dL (ref 0.44–1.00)
GFR calc Af Amer: >60 mL/min (ref >60)
GFR calc non Af Amer: >60 mL/min (ref >60)
Glucose, Bld: 86 mg/dL (ref 70–99)
Potassium: 4 mmol/L (ref 3.5–5.1)
Sodium: 135 mmol/L (ref 135–145)
Total Bilirubin: 0.6 mg/dL (ref 0.3–1.2)
Total Protein: 6.9 g/dL (ref 6.5–8.1)

## 2018-08-09 MED ORDER — LETROZOLE 2.5 MG PO TABS: 2.5000 mg | ORAL_TABLET | Freq: Every day | ORAL | 0 refills | Status: DC

## 2018-08-09 NOTE — Telephone Encounter (Signed)
-----   Message from Lequita Asal, MD sent at 08/09/2018 11:03 AM EST ----- Regarding: Please encourage patient to take her calcium and vitamin D  Calcium 1200 mg Vitamin 800 units  M ----- Message ----- From: Buel Ream, [facility] [place] Sent: 08/09/2018  10:24 AM EST To: Lequita Asal, MD

## 2018-08-09 NOTE — Telephone Encounter (Signed)
Informed patient of low calcium levels and per Dr. Loletha Grayer,  To start taking Calcium + Vit D 1200/800 Daily. Pt verbalizes understanding and denies any questions at this time.

## 2018-08-09 NOTE — Progress Notes (Signed)
Patient c/o pain but states it is nothing new

## 2018-08-09 NOTE — Progress Notes (Signed)
[facility]     [address]     [place], [place] [address]     Phone: [phone]      Fax: [phone]        Clinic day:  08/09/2018  Chief Complaint: Connie Mitchell is a 85 y.o. female with stage IA left breast cancer who is seen for assessment after interval mastectomy.  HPI:  The patient was last seen in the medical oncology clinic on 07/07/2018 for new patient assessment.  She had a clinical T1cN0 left breast cancer.  Tumor was ER + (> 90%), PR+ (> 90%), and Her2/neu 1+.  She had a history of left breast cancer s/p lumpectomy in 1991.  She received radiation.  She declined chemotherapy and endocrine therapy.  She underwent mastectomy and sentinel lymph node biopsy on 07/25/2018 by Dr. Hampton Abbot.  Pathology revealed a 10 x 8 x 7 mm grade 1 invasive mammary carcinoma.  Margins were negative.  Four sentinel lymph nodes were negative.  Pathologic stage was T1bN0(sn).  She was seen by Dr. Hampton Abbot on 08/05/2018.  She was doing well.  One drain was removed.  The other drain is scheduled for removal on 08/09/2018.  During the interim, she has felt tired.  Echo on 06/20/2018 revealed an EF of 60-65%.  She states that she does not want chemotherapy.  She is unsure of endocrine therapy.  She is concerned about potential side effects.   Past Medical History:  Diagnosis Date  . Cancer ([place]) 1991   left breast. treated with rads and lumpectomy  . Cancer ([place]) 2020   newly diagnosed, also left breast  . Cellulitis and abscess of toe of left foot 01/2018  . GERD (gastroesophageal reflux disease)   . Hx of therapeutic radiation 1991   left breast  . Laryngopharyngeal reflux    sometimes food goes down wrong way and she ends up in extreme coughing/choking scenario  . Personal history of radiation therapy 1991   left breast ca  . Primary osteoarthritis   . Thoracic aortic aneurysm (TAA) ([place])     Past Surgical History:  Procedure Laterality Date  .  BREAST LUMPECTOMY Left 1991   breast ca, lumpectomy  . cataracts Bilateral   . CYSTOCELE REPAIR  2010  . EYE SURGERY Bilateral 2017   cataracts extracted  . JOINT REPLACEMENT    . MASTECTOMY W/ SENTINEL NODE BIOPSY Left 07/25/2018   Procedure: MASTECTOMY WITH SENTINEL LYMPH NODE BIOPSY;  Surgeon: Olean Ree, MD;  Location: [facility] ORS;  Service: General;  Laterality: Left;  . OOPHORECTOMY  1995   ovarian mass - benign  . REPLACEMENT TOTAL KNEE Bilateral 2012,2013  . SKIN BIOPSY Left    arm growth  . TONSILLECTOMY  1958  . TOTAL SHOULDER ARTHROPLASTY Right 2016    Family History  Problem Relation Age of Onset  . Hypertension Mother   . Alzheimer's disease Mother   . Stroke Father   . Cancer Brother        prostate  . Atrial fibrillation Brother   . Cancer Other        breast  . Atrial fibrillation Brother   . Breast cancer Cousin 72       paternal x 2    Social History:  reports that she has never smoked. She has never used smokeless tobacco. She reports current alcohol use. She reports that she does not use drugs.  [place]-MA-[place]; here in [place] since 03/2017. She  lives in [place]. She consumes minimal amounts of ETOH on an infrequent basis. She has never smoked. Patient denies known exposures to radiation on toxins. 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  Patient has 1 cat and 2 dogs.    Allergies:  Allergies  Allergen Reactions  . Ciprofloxacin Nausea And Vomiting    Current Medications: Current Outpatient Medications  Medication Sig Dispense Refill  . loratadine (CLARITIN REDITABS) 10 MG dissolvable tablet Take 10 mg by mouth at bedtime.    . montelukast (SINGULAIR) 10 MG tablet TAKE 1 TABLET BY MOUTH EVERYDAY AT BEDTIME (Patient taking differently: Take 10 mg by mouth at bedtime. ) 90 tablet 1  . Calcium Carbonate-Vitamin D (CALCIUM-VITAMIN D) 600-125 MG-UNIT TABS Take by mouth.    . Multiple Vitamins-Minerals ([place] 50+) CAPS     . omeprazole (PRILOSEC)  40 MG capsule TAKE 1 CAPSULE BY MOUTH EVERY DAY 30 capsule 5   No current facility-administered medications for this visit.     Review of Systems:  GENERAL:  Feels tired.  No fevers, sweats or weight loss. PERFORMANCE STATUS (ECOG):  1 HEENT:  No visual changes, runny nose, sore throat, mouth sores or tenderness. Lungs: No shortness of breath.  Chronic cough.  No hemoptysis. Cardiac:  No chest pain, palpitations, orthopnea, or PND.  Echo on 06/20/2018 revealed an EF of 60-65%. GI:  No nausea, vomiting, diarrhea, constipation, melena or hematochezia. GU:  No urgency, frequency, dysuria, or hematuria. Musculoskeletal:  Scoliosis.  Chronic back pain.  s/p bilateral knee and right shoulder replacement.  No muscle tenderness. Extremities:  No pain or swelling. Skin:  Mastectomy site healing well.  Drain in place.  No rashes or skin changes. Neuro:  No headache, numbness or weakness, balance or coordination issues. Endocrine:  No diabetes, thyroid issues, hot flashes or night sweats. Psych:  No mood changes, depression or anxiety. Pain:  No focal pain. Review of systems:  All other systems reviewed and found to be negative.   Physical Exam: Blood pressure 111/73, pulse 85, temperature 98.5 F (36.9 C), resp. rate 18, height '5\' 3"'  (1.6 m), weight 175 lb (79.4 kg), SpO2 99 %. GENERAL:  Well developed, well nourished, woman sitting comfortably in the exam room in no acute distress. MENTAL STATUS:  Alert and oriented to person, place and time. HEAD:  Short gray hair.  Normocephalic, atraumatic, face symmetric, no Cushingoid features. EYES:  Blue eyes s/p catarcact surgery.  No conjunctivitis or scleral icterus. NEUROLOGICAL: Unremarkable. PSYCH:  Appropriate.    Appointment on 08/09/2018  Component Date Value Ref Range Status  . Platelet CT in [place] 08/09/2018 234   Final   Performed at [facility], [address], [place], [place] [address]  Office Visit on 08/09/2018    Component Date Value Ref Range Status  . Sodium 08/09/2018 135  135 - 145 mmol/L Final  . Potassium 08/09/2018 4.0  3.5 - 5.1 mmol/L Final  . Chloride 08/09/2018 98  98 - 111 mmol/L Final  . CO2 08/09/2018 27  22 - 32 mmol/L Final  . Glucose, Bld 08/09/2018 86  70 - 99 mg/dL Final  . BUN 08/09/2018 25* 8 - 23 mg/dL Final  . Creatinine, Ser 08/09/2018 0.76  0.44 - 1.00 mg/dL Final  . Calcium 08/09/2018 8.6* 8.9 - 10.3 mg/dL Final  . Total Protein 08/09/2018 6.9  6.5 - 8.1 g/dL Final  . Albumin 08/09/2018 3.8  3.5 - 5.0 g/dL Final  . AST 08/09/2018 19  15 - 41 U/L Final  . ALT 08/09/2018 25  0 - 44 U/L Final  . Alkaline Phosphatase 08/09/2018 74  38 - 126 U/L Final  . Total Bilirubin 08/09/2018 0.6  0.3 - 1.2 mg/dL Final  . GFR calc non Af Amer 08/09/2018 >60  >60 mL/min Final  . GFR calc Af Amer 08/09/2018 >60  >60 mL/min Final  . Anion gap 08/09/2018 10  5 - 15 Final   Performed at [facility], [address] [place], [place] [address]  . WBC 08/09/2018 11.8* 4.0 - 10.5 K/uL Final  . RBC 08/09/2018 5.08  3.87 - 5.11 MIL/uL Final  . Hemoglobin 08/09/2018 15.9* 12.0 - 15.0 g/dL Final  . HCT 08/09/2018 46.8* 36.0 - 46.0 % Final  . MCV 08/09/2018 92.1  80.0 - 100.0 fL Final  . MCH 08/09/2018 31.3  26.0 - 34.0 pg Final  . MCHC 08/09/2018 34.0  30.0 - 36.0 g/dL Final  . RDW 08/09/2018 15.1  11.5 - 15.5 % Final  . Platelets 08/09/2018 213  150 - 400 K/uL Final   Comment: Immature Platelet Fraction may be clinically indicated, consider ordering this additional test [id number]   . nRBC 08/09/2018 0.0  0.0 - 0.2 % Final  . Neutrophils Relative % 08/09/2018 69  % Final  . Neutro Abs 08/09/2018 8.1* 1.7 - 7.7 K/uL Final  . Lymphocytes Relative 08/09/2018 19  % Final  . Lymphs Abs 08/09/2018 2.3  0.7 - 4.0 K/uL Final  . Monocytes Relative 08/09/2018 8  % Final  . Monocytes Absolute 08/09/2018 0.9  0.1 - 1.0 K/uL Final  . Eosinophils Relative 08/09/2018 3  % Final  .  Eosinophils Absolute 08/09/2018 0.3  0.0 - 0.5 K/uL Final  . Basophils Relative 08/09/2018 0  % Final  . Basophils Absolute 08/09/2018 0.1  0.0 - 0.1 K/uL Final  . Immature Granulocytes 08/09/2018 1  % Final  . Abs Immature Granulocytes 08/09/2018 0.12* 0.00 - 0.07 K/uL Final   Performed at [facility], [address] [place], [place] [address]    Assessment:  Connie Mitchell is a 85 y.o. female with stage IA left breast cancer s/p mastectomy and sentinel lymph node biopsy on 07/25/2018.  Pathology revealed a 10 x 8 x 7 mm grade 1 invasive mammary carcinoma.  Margins were negative.  Four sentinel lymph nodes were negative.  Tumor is ER + (> 90%), PR+ (> 90%), and Her2/neu 1+.  Pathologic stage was T1bN0(sn).  CA27.29 was 30.3 on 07/07/2018.  Chest CT on 05/10/2018 revealed a 1.3 cm left breast mass.   Bilateral mammogram and ultrasound on 06/29/2018 revealed lumpectomy changes in the lateral left breast.  There was 1.8 x 1.7 x 1.0 cm lobulated mass in the upper outer quadrant.  There was no enlarged adenopathy.  She has a history of left breast cancer s/p lumpectomy in 1991.  She received radiation.  She declined chemotherapy and endocrine therapy.  Bone density on 11/29/2017 revealed osteopenia with a T-score of -2.1 in the right femur.  Symptomatically, she feels tired today.  She is doing well s/p mastectomy.  She has a drain in place.  Plan: 1.   Labs today: CBC with diff, plateelt count in blue top tube (secondary to clumping), CMP. 2.   Clinical stage IA left breast cancer  Review final pathology.  Discuss plan for endocrine therapy.  Discuss tamoxifen versus aromatase inhibitors (Femara, Arimidex, Aromasin).  Side effects reviewed in detail.  Information  provided.  Patient considering.  Rx:  Femara 2.5 mg po q day (dis: #30; 0 refills).  Discuss osteopenia (baseline) and potential increase bone thinning on an aromatase inhibitor.  3.   Osteopenia  Discuss calcium and  vitamin D.  Discuss consideration of Prolia.  Potential side effects reviewed.  Discuss need for dental clearance.  Preauth Prolia. 4.   RTC in 1 month for MD assessment, labs(CMP) and +/- Prolia.  I discussed the assessment and treatment plan with the patient.  The patient was provided an opportunity to ask questions and all were answered.  The patient agreed with the plan and demonstrated an understanding of the instructions.  The patient was advised to call back or seek an in person evaluation if the symptoms worsen or if the condition fails to improve as anticipated.    Lequita Asal, MD  08/09/2018, 4:35 PM

## 2018-08-09 NOTE — Progress Notes (Signed)
08/09/2018  HPI: Connie Mitchell is a 85 y.o. female s/p left mastectomy and left sentinel lymph node biopsy on 07/25/18.  She presents for drain removal.  She met with Dr. Mike Gip today and declined chemotherapy and received information on Letrozole.  She is unsure of what treatments she wants based on her age.  From surgery standpoint, she denies any incisional pain and reports only some discomfort where the previous drain used to be under her skin, but that has been improving.  Remaining drain continues to have serosanguinous fluid, low volume.  Vital signs: BP 136/82   Pulse 86   Temp (!) 95.7 F (35.4 C) (Oral)   Resp 16   Ht '5\' 3"'  (1.6 m)   Wt 175 lb (79.4 kg)   SpO2 98%   BMI 31.00 kg/m    Physical Exam: Constitutional: No acute distress Breast:  Left mastectomy and sentinel node incisions are clean, dry, intact.  There is mild dryness to the skin particularly above the mastectomy incision consistent with some very minimal flap ischemia.  Remaining drain was removed and dressed with gauze and tape, without any complications.  Assessment/Plan: This is a 85 y.o. female s/p left mastectomy and sentinel node biopsy.  --Discussed with the patient the rationale for recommending hormonal therapy.  She is concerned about some side effects, and having to go to dentistry clearance and check her osteopenia and is worried how this would affect her arthritis and back injections.  I recommended that she look over the information she received about Letrozole today and think about it.  Discussed with her that it is her personal choice to accept or decline treatments.  Also discussed with her that she can always calls [place] if she has any questions or if we need to coordinate with Dr. Mike Gip for any appointments. --From surgical standpoint, discussed with her that the areas of dryness above her incision will heal on their own without issues.  This is very minimal and not worried about any worsening  complications.   --She will follow up with me in 6 months for physical exam and assess her progress.   Melvyn Neth, [facility]

## 2018-08-09 NOTE — Patient Instructions (Signed)
We removed the drain today.You will need to keep a dressing over the area until it heals completely.

## 2018-08-09 NOTE — Patient Instructions (Signed)
Letrozole tablets What is this medicine? LETROZOLE (LET roe zole) blocks the production of estrogen. It is used to treat breast cancer. This medicine may be used for other purposes; ask your health care provider or pharmacist if you have questions. COMMON BRAND NAME(S): Femara What should I tell my health care provider before I take this medicine? They need to know if you have any of these conditions: -high cholesterol -liver disease -osteoporosis (weak bones) -an unusual or allergic reaction to letrozole, other medicines, foods, dyes, or preservatives -pregnant or trying to get pregnant -breast-feeding How should I use this medicine? Take this medicine by mouth with a glass of water. You may take it with or without food. Follow the directions on the prescription label. Take your medicine at regular intervals. Do not take your medicine more often than directed. Do not stop taking except on your doctor's advice. Talk to your pediatrician regarding the use of this medicine in children. Special care may be needed. Overdosage: If you think you have taken too much of this medicine contact a poison control center or emergency room at once. NOTE: This medicine is only for you. Do not share this medicine with others. What if I miss a dose? If you miss a dose, take it as soon as you can. If it is almost time for your next dose, take only that dose. Do not take double or extra doses. What may interact with this medicine? Do not take this medicine with any of the following medications: -estrogens, like hormone replacement therapy or birth control pills This medicine may also interact with the following medications: -dietary supplements such as androstenedione or DHEA -prasterone -tamoxifen This list may not describe all possible interactions. Give your health care provider a list of all the medicines, herbs, non-prescription drugs, or dietary supplements you use. Also tell them if you smoke, drink  alcohol, or use illegal drugs. Some items may interact with your medicine. What should I watch for while using this medicine? Tell your doctor or healthcare professional if your symptoms do not start to get better or if they get worse. Do not become pregnant while taking this medicine or for 3 weeks after stopping it. Women should inform their doctor if they wish to become pregnant or think they might be pregnant. There is a potential for serious side effects to an unborn child. Talk to your health care professional or pharmacist for more information. Do not breast-feed while taking this medicine or for 3 weeks after stopping it. This medicine may interfere with the ability to have a child. Talk with your doctor or health care professional if you are concerned about your fertility. Using this medicine for a long time may increase your risk of low bone mass. Talk to your doctor about bone health. You may get drowsy or dizzy. Do not drive, use machinery, or do anything that needs mental alertness until you know how this medicine affects you. Do not stand or sit up quickly, especially if you are an older patient. This reduces the risk of dizzy or fainting spells. You may need blood work done while you are taking this medicine. What side effects may I notice from receiving this medicine? Side effects that you should report to your doctor or health care professional as soon as possible: -allergic reactions like skin rash, itching, or hives -bone fracture -chest pain -signs and symptoms of a blood clot such as breathing problems; changes in vision; chest pain; severe, sudden headache; pain, swelling,   warmth in the leg; trouble speaking; sudden numbness or weakness of the face, arm or leg -vaginal bleeding Side effects that usually do not require medical attention (report to your doctor or health care professional if they continue or are bothersome): -bone, back, joint, or muscle  pain -dizziness -fatigue -fluid retention -headache -hot flashes, night sweats -nausea -weight gain This list may not describe all possible side effects. Call your doctor for medical advice about side effects. You may report side effects to FDA at 1-800-FDA-1088. Where should I keep my medicine? Keep out of the reach of children. Store between 15 and 30 degrees C (59 and 86 degrees F). Throw away any unused medicine after the expiration date. NOTE: This sheet is a summary. It may not cover all possible information. If you have questions about this medicine, talk to your doctor, pharmacist, or health care provider.  2019 Elsevier/Gold Standard (2016-01-06 11:10:41)  

## 2018-08-10 LAB — PLATELET BY CITRATE: Platelet CT in Citrate: 234

## 2018-08-11 ENCOUNTER — Other Ambulatory Visit: Payer: Self-pay | Admitting: Internal Medicine

## 2018-08-11 ENCOUNTER — Telehealth: Payer: Self-pay

## 2018-08-11 DIAGNOSIS — M7071 Other bursitis of hip, right hip: Secondary | ICD-10-CM

## 2018-08-11 DIAGNOSIS — C50412 Malignant neoplasm of upper-outer quadrant of left female breast: Secondary | ICD-10-CM | POA: Diagnosis not present

## 2018-08-11 DIAGNOSIS — Z9012 Acquired absence of left breast and nipple: Secondary | ICD-10-CM | POA: Diagnosis not present

## 2018-08-11 DIAGNOSIS — M150 Primary generalized (osteo)arthritis: Secondary | ICD-10-CM | POA: Diagnosis not present

## 2018-08-11 DIAGNOSIS — I712 Thoracic aortic aneurysm, without rupture: Secondary | ICD-10-CM | POA: Diagnosis not present

## 2018-08-11 DIAGNOSIS — M5431 Sciatica, right side: Secondary | ICD-10-CM

## 2018-08-11 DIAGNOSIS — Z483 Aftercare following surgery for neoplasm: Secondary | ICD-10-CM | POA: Diagnosis not present

## 2018-08-11 DIAGNOSIS — Z4801 Encounter for change or removal of surgical wound dressing: Secondary | ICD-10-CM | POA: Diagnosis not present

## 2018-08-11 NOTE — Telephone Encounter (Signed)
Yes, will refer to Ortho.

## 2018-08-11 NOTE — Telephone Encounter (Signed)
Patient called and left VM saying she finished her prednisone. She is still no better. Wants to know if she can be referred to a specialist.   Please Advise.

## 2018-08-12 DIAGNOSIS — Z4801 Encounter for change or removal of surgical wound dressing: Secondary | ICD-10-CM | POA: Diagnosis not present

## 2018-08-12 DIAGNOSIS — M150 Primary generalized (osteo)arthritis: Secondary | ICD-10-CM | POA: Diagnosis not present

## 2018-08-12 DIAGNOSIS — Z9012 Acquired absence of left breast and nipple: Secondary | ICD-10-CM | POA: Diagnosis not present

## 2018-08-12 DIAGNOSIS — I712 Thoracic aortic aneurysm, without rupture: Secondary | ICD-10-CM | POA: Diagnosis not present

## 2018-08-12 DIAGNOSIS — C50412 Malignant neoplasm of upper-outer quadrant of left female breast: Secondary | ICD-10-CM | POA: Diagnosis not present

## 2018-08-12 DIAGNOSIS — Z483 Aftercare following surgery for neoplasm: Secondary | ICD-10-CM | POA: Diagnosis not present

## 2018-08-12 NOTE — Telephone Encounter (Signed)
Called and left VM informing patient that Dr B placed referral for orthopedics. Told her someone should be calling her in the next 5 days to schedule appt with her.

## 2018-08-16 ENCOUNTER — Telehealth: Payer: Self-pay | Admitting: Urgent Care

## 2018-08-16 NOTE — Telephone Encounter (Signed)
  [facility] [address] [place], [place] [address] Phone: [phone] Fax: [phone]   Date: 08/16/18  Name: Connie Mitchell DOB: Mar 11, 1934 MRN: [id number]  Re: Endocrine therapy  Returned call to patient to discuss concerns related to initiation of endocrine therapy. Patient started on AI (letrozole) therapy at 08/09/2018 visit, however to date, she has not started the medication. Patient states, "I went home and read over everything. I am old. I can't see adding all those potential side effects to my life. What are the chances of the breast cancer coming back before I die"?   Discussed benefits of AI therapy at length with patient. Reviewed 5 year DFS rate of 89% associated with letrozole (95% CI 87.3-91.1).  Patient adamant that she does not wish to be on this medication, even after the provided education. Patient advised that I would solicit the thoughts of her primary attending oncologist Mike Gip, MD) and get back with her to discuss further.   Of note, patient currently scheduled to RTC on 09/06/2018. Face to face discussions may be of more benefit for this patient due to her age.   Honor Loh, MSN, APRN, FNP-C, CEN Oncology/Hematology Nurse Practitioner  [place] 08/16/18, 4:31 PM

## 2018-08-18 ENCOUNTER — Telehealth: Payer: Self-pay

## 2018-08-18 NOTE — Telephone Encounter (Signed)
Patient called to alert [place] she is no longer going to be needing referral to [facility]. She has notified them she will be going to [facility] on 08/22/2018.

## 2018-08-22 DIAGNOSIS — M461 Sacroiliitis, not elsewhere classified: Secondary | ICD-10-CM | POA: Diagnosis not present

## 2018-08-22 DIAGNOSIS — M47816 Spondylosis without myelopathy or radiculopathy, lumbar region: Secondary | ICD-10-CM | POA: Diagnosis not present

## 2018-08-22 DIAGNOSIS — M48062 Spinal stenosis, lumbar region with neurogenic claudication: Secondary | ICD-10-CM | POA: Diagnosis not present

## 2018-08-22 DIAGNOSIS — M5136 Other intervertebral disc degeneration, lumbar region: Secondary | ICD-10-CM | POA: Diagnosis not present

## 2018-08-26 ENCOUNTER — Encounter: Payer: Self-pay | Admitting: Hematology and Oncology

## 2018-08-29 ENCOUNTER — Telehealth: Payer: Self-pay

## 2018-08-29 NOTE — Telephone Encounter (Signed)
Please call patient and advise she will need an OV for possible Colitis. LM that she has abdominal pains and colitis symptoms and wants med called in. We need to see her to make sure this is the issue. If nausea and vomiting with pain send to ER. Otherwise see her in OV in 24-48 hrs

## 2018-08-31 ENCOUNTER — Other Ambulatory Visit: Payer: Self-pay

## 2018-08-31 ENCOUNTER — Encounter: Payer: Self-pay | Admitting: Internal Medicine

## 2018-08-31 ENCOUNTER — Ambulatory Visit (INDEPENDENT_AMBULATORY_CARE_PROVIDER_SITE_OTHER): Payer: Medicare Other | Admitting: Internal Medicine

## 2018-08-31 ENCOUNTER — Other Ambulatory Visit: Payer: Self-pay | Admitting: Internal Medicine

## 2018-08-31 VITALS — BP 98/64 | HR 77 | Ht 63.0 in | Wt 181.0 lb

## 2018-08-31 DIAGNOSIS — M5136 Other intervertebral disc degeneration, lumbar region: Secondary | ICD-10-CM | POA: Diagnosis not present

## 2018-08-31 DIAGNOSIS — C50412 Malignant neoplasm of upper-outer quadrant of left female breast: Secondary | ICD-10-CM | POA: Diagnosis not present

## 2018-08-31 DIAGNOSIS — Z170 Estrogen receptor positive status [ER+]: Secondary | ICD-10-CM | POA: Diagnosis not present

## 2018-08-31 DIAGNOSIS — K529 Noninfective gastroenteritis and colitis, unspecified: Secondary | ICD-10-CM

## 2018-08-31 NOTE — Progress Notes (Signed)
Date:  08/31/2018   Name:  Connie Mitchell   DOB:  07/07/1933   MRN:  [id number]   Chief Complaint: Abdominal Pain ([place] she had ulcerative colitis. Was given Sulfa for a year and then it stopped. Had severe pain starting last Tuesday a week ago.  Lower abd pain. Was doubling over. Has mucous in stool. No blood.)  Abdominal Pain  This is a new problem. The current episode started in the past 7 days. The problem has been resolved. The patient is experiencing no pain (had severe pain last week but none now). Pertinent negatives include no constipation, diarrhea, fever, hematochezia, melena, nausea or vomiting.  A week ago she started with lower abdominal cramping, frequent small soft stools with some clear mucus for several days.  She had some low grade fever but no vomiting, no bleeding.  She had no change in appetite.  Yesterday she just started to notice more normal stool.  The pain is much improved.   Review of Systems  Constitutional: Negative for chills, fatigue, fever and unexpected weight change.  Respiratory: Negative for cough, chest tightness, shortness of breath and wheezing.   Cardiovascular: Negative for chest pain, palpitations and leg swelling.  Gastrointestinal: Positive for abdominal pain. Negative for anal bleeding, blood in stool, constipation, diarrhea, hematochezia, melena, nausea, rectal pain and vomiting.  Hematological: Negative for adenopathy.  Psychiatric/Behavioral: Negative for dysphoric mood and sleep disturbance.    Patient Active Problem List   Diagnosis Date Noted  . Breast cancer, left ([place]) 07/25/2018  . Osteopenia of neck of right femur 07/12/2018  . Malignant neoplasm of upper-outer quadrant of left female breast ([place]) 07/07/2018  . Thoracic ascending aortic aneurysm ([place]) 05/27/2018  . LPRD (laryngopharyngeal reflux disease) 03/17/2018  . Post-nasal drip 03/17/2018  . Allergic cough 11/25/2017  . Arthritis involving multiple sites 11/25/2017  .  Chronic foot pain 11/25/2017  . Lumbar degenerative disc disease 09/24/2017  . Hx of Lyme disease 09/24/2017  . Hx of basal cell carcinoma 09/24/2017  . Elevated blood pressure reading 09/24/2017  . Nuclear sclerotic cataract of left eye 08/09/2015  . Osteoporosis 07/04/2014  . Primary localized osteoarthrosis of shoulder region 12/13/2012  . History of breast cancer 12/18/2011  . Osteoarthritis 12/18/2011    Allergies  Allergen Reactions  . Ciprofloxacin Nausea And Vomiting    Past Surgical History:  Procedure Laterality Date  . BREAST LUMPECTOMY Left 1991   breast ca, lumpectomy  . cataracts Bilateral   . CYSTOCELE REPAIR  2010  . EYE SURGERY Bilateral 2017   cataracts extracted  . JOINT REPLACEMENT    . MASTECTOMY W/ SENTINEL NODE BIOPSY Left 07/25/2018   Procedure: MASTECTOMY WITH SENTINEL LYMPH NODE BIOPSY;  Surgeon: Connie Ree, MD;  Location: [facility] ORS;  Service: General;  Laterality: Left;  . OOPHORECTOMY  1995   ovarian mass - benign  . REPLACEMENT TOTAL KNEE Bilateral 2012,2013  . SKIN BIOPSY Left    arm growth  . TONSILLECTOMY  1958  . TOTAL SHOULDER ARTHROPLASTY Right 2016    Social History   Tobacco Use  . Smoking status: Never Smoker  . Smokeless tobacco: Never Used  . Tobacco comment: smoking cessation materials not required  Substance Use Topics  . Alcohol use: Yes    Comment: occasional  . Drug use: Never     Medication list has been reviewed and updated.  Current Meds  Medication Sig  . Calcium-Magnesium-Vitamin D (CALCIUM 1200+D3) 600-40-500 MG-MG-UNIT TB24   . loratadine (  CLARITIN REDITABS) 10 MG dissolvable tablet Take 10 mg by mouth at bedtime.  . montelukast (SINGULAIR) 10 MG tablet TAKE 1 TABLET BY MOUTH EVERYDAY AT BEDTIME (Patient taking differently: Take 10 mg by mouth at bedtime. )  . Multiple Vitamins-Minerals ([place] 50+) CAPS   . omeprazole (PRILOSEC) 40 MG capsule Take 1 capsule (40 mg total) by mouth daily.  . Papaya  Enzyme CHEW Chew 2 each by mouth 3 (three) times daily with meals.     PHQ 2/9 Scores 08/31/2018 08/31/2018 08/01/2018 05/27/2018  PHQ - 2 Score 0 0 0 0  PHQ- 9 Score - - - -    Physical Exam Vitals signs and nursing note reviewed.  Constitutional:      General: She is not in acute distress.    Appearance: She is well-developed.  HENT:     Head: Normocephalic and atraumatic.  Eyes:     Extraocular Movements: Extraocular movements intact.  Cardiovascular:     Rate and Rhythm: Normal rate and regular rhythm.     Heart sounds: Normal heart sounds. No murmur.  Pulmonary:     Effort: Pulmonary effort is normal. No respiratory distress.  Chest:    Abdominal:     General: Bowel sounds are normal. There is no distension or abdominal bruit. There are no signs of injury.     Palpations: Abdomen is soft.     Tenderness: There is no abdominal tenderness. There is no right CVA tenderness, left CVA tenderness, guarding or rebound.  Musculoskeletal: Normal range of motion.  Skin:    General: Skin is warm and dry.     Findings: No rash.  Neurological:     Mental Status: She is alert and oriented to person, place, and time.  Psychiatric:        Behavior: Behavior normal.        Thought Content: Thought content normal.     Wt Readings from Last 3 Encounters:  08/31/18 181 lb (82.1 kg)  08/09/18 175 lb (79.4 kg)  08/09/18 175 lb (79.4 kg)    BP 98/64   Pulse 77   Ht 5\' 3"  (1.6 m)   Wt 181 lb (82.1 kg)   SpO2 95%   BMI 32.06 kg/m   Assessment and Plan: 1. Gastroenteritis Sx now resolved; does not sound like colitis or diverticulitis  2. Malignant neoplasm of upper-outer quadrant of left breast in female, estrogen receptor positive ([place]) S/p left mastectomy; healing well  3. Lumbar degenerative disc disease Right leg sx attributed to lumbar DDD - after consultation with Neurosurgery, felt to be pinched nerve and recommend pool exercises which she will start once breast has  healed completely   Partially dictated using Dragon software. Any errors are unintentional.  Connie Maidens, MD [facility]  08/31/2018

## 2018-09-01 ENCOUNTER — Other Ambulatory Visit: Payer: Self-pay | Admitting: Hematology and Oncology

## 2018-09-01 ENCOUNTER — Encounter: Payer: Self-pay | Admitting: Internal Medicine

## 2018-09-01 NOTE — Telephone Encounter (Signed)
Dr. Gonzalez please advise. thanks 

## 2018-09-06 ENCOUNTER — Other Ambulatory Visit: Payer: Medicare Other

## 2018-09-06 ENCOUNTER — Ambulatory Visit: Payer: Medicare Other

## 2018-09-06 ENCOUNTER — Ambulatory Visit: Payer: Medicare Other | Admitting: Hematology and Oncology

## 2018-09-14 ENCOUNTER — Other Ambulatory Visit: Payer: Self-pay | Admitting: Internal Medicine

## 2018-09-19 ENCOUNTER — Encounter: Payer: Self-pay | Admitting: *Deleted

## 2018-09-19 NOTE — Progress Notes (Signed)
Called patient to follow up.  No needs at this time.  She is to call if she has any questions or needs.

## 2018-09-20 ENCOUNTER — Ambulatory Visit: Payer: Medicare Other | Admitting: Hematology and Oncology

## 2018-09-20 ENCOUNTER — Other Ambulatory Visit: Payer: Medicare Other

## 2018-09-20 ENCOUNTER — Ambulatory Visit: Payer: Medicare Other

## 2018-11-21 ENCOUNTER — Ambulatory Visit (INDEPENDENT_AMBULATORY_CARE_PROVIDER_SITE_OTHER): Payer: Medicare Other

## 2018-11-21 VITALS — BP 128/79 | HR 78 | Ht 63.0 in | Wt 174.0 lb

## 2018-11-21 DIAGNOSIS — Z0000 Encounter for general adult medical examination without abnormal findings: Secondary | ICD-10-CM

## 2018-11-21 NOTE — Progress Notes (Signed)
Subjective:   Connie Mitchell is a 85 y.o. female who presents for Medicare Annual (Subsequent) preventive examination.  Virtual Visit via Telephone Note  I connected with Connie Mitchell on 11/21/18 at  2:00 PM EDT by telephone and verified that I am speaking with the correct person using two identifiers.  Medicare Annual Wellness visit completed telephonically due to Covid-19 pandemic.   Location: Patient: home Provider: office   I discussed the limitations, risks, security and privacy concerns of performing an evaluation and management service by telephone and the availability of in person appointments.  The patient expressed understanding and agreed to proceed.  Some vital signs may be absent or patient reported.   [name], LPN   Review of Systems:   Cardiac Risk Factors include: advanced age (>37men, >82 women);obesity (BMI >30kg/m2)     Objective:     Vitals: BP 128/79   Pulse 78   Ht 5\' 3"  (1.6 m)   Wt 174 lb (78.9 kg)   BMI 30.82 kg/m   Body mass index is 30.82 kg/m.  Advanced Directives 11/21/2018 08/09/2018 07/25/2018 07/19/2018 07/19/2018 07/07/2018 11/19/2017  Does Patient Have a Medical Advance Directive? No Yes No No No No No  Type of Advance Directive - Healthcare Power of [place];Living will Living will - - - -  Does patient want to make changes to medical advance directive? No - Patient declined No - Patient declined No - Patient declined - - - -  Copy of [place] in Chart? - Yes - validated most recent copy scanned in chart (See row information) - - - - -  Would patient like information on creating a medical advance directive? - No - Patient declined No - Patient declined - Yes (MAU/Ambulatory/Procedural Areas - Information given) No - Patient declined -    Tobacco Social History   Tobacco Use  Smoking Status Never Smoker  Smokeless Tobacco Never Used  Tobacco Comment   smoking cessation materials not required     Counseling given:  Not Answered Comment: smoking cessation materials not required   Clinical Intake:  Pre-visit preparation completed: Yes  Pain : 0-10(generalized back pain) Pain Type: Chronic pain Pain Location: Back Pain Descriptors / Indicators: Aching, Discomfort Pain Onset: More than a month ago Pain Frequency: Constant     BMI - recorded: 30.82 Nutritional Status: BMI > 30  Obese Nutritional Risks: None Diabetes: No  How often do you need to have someone help you when you read instructions, pamphlets, or other written materials from your doctor or pharmacy?: 1 - Never  Interpreter Needed?: No  Information entered by :: [name] LPN  Past Medical History:  Diagnosis Date  . Cancer ([place]) 1991   left breast. treated with rads and lumpectomy  . Cancer ([place]) 2020   newly diagnosed, also left breast  . Cellulitis and abscess of toe of left foot 01/2018  . GERD (gastroesophageal reflux disease)   . Hx of therapeutic radiation 1991   left breast  . Laryngopharyngeal reflux    sometimes food goes down wrong way and she ends up in extreme coughing/choking scenario  . Personal history of radiation therapy 1991   left breast ca  . Primary osteoarthritis   . Thoracic aortic aneurysm (TAA) ([place])    Past Surgical History:  Procedure Laterality Date  . BREAST LUMPECTOMY Left 1991   breast ca, lumpectomy  . cataracts Bilateral   . CYSTOCELE REPAIR  2010  . EYE SURGERY Bilateral 2017  cataracts extracted  . JOINT REPLACEMENT    . MASTECTOMY W/ SENTINEL NODE BIOPSY Left 07/25/2018   Procedure: MASTECTOMY WITH SENTINEL LYMPH NODE BIOPSY;  Surgeon: Olean Ree, MD;  Location: [facility] ORS;  Service: General;  Laterality: Left;  . OOPHORECTOMY  1995   ovarian mass - benign  . REPLACEMENT TOTAL KNEE Bilateral 2012,2013  . SKIN BIOPSY Left    arm growth  . TONSILLECTOMY  1958  . TOTAL SHOULDER ARTHROPLASTY Right 2016   Family History  Problem Relation Age of Onset  . Hypertension  Mother   . Alzheimer's disease Mother   . Stroke Father   . Cancer Brother        prostate  . Atrial fibrillation Brother   . Cancer Other        breast  . Atrial fibrillation Brother   . Breast cancer Cousin 72       paternal x 2   Social History   Socioeconomic History  . Marital status: Single    Spouse name: Not on file  . Number of children: 0  . Years of education: Not on file  . Highest education level: Master's degree (e.g., MA, MS, MEng, MEd, MSW, MBA)  Occupational History  . Occupation: [employer]!    Comment: retired  Scientific laboratory technician  . Financial resource strain: Not hard at all  . Food insecurity:    Worry: Never true    Inability: Never true  . Transportation needs:    Medical: No    Non-medical: No  Tobacco Use  . Smoking status: Never Smoker  . Smokeless tobacco: Never Used  . Tobacco comment: smoking cessation materials not required  Substance and Sexual Activity  . Alcohol use: Yes    Comment: occasional  . Drug use: Never  . Sexual activity: Not Currently  Lifestyle  . Physical activity:    Days per week: 4 days    Minutes per session: 60 min  . Stress: Not at all  Relationships  . Social connections:    Talks on phone: More than three times a week    Gets together: Three times a week    Attends religious service: Never    Active member of club or organization: No    Attends meetings of clubs or organizations: Never    Relationship status: Never married  Other Topics Concern  . Not on file  Social History Narrative  . Not on file    Outpatient Encounter Medications as of 11/21/2018  Medication Sig  . Calcium Carbonate-Vitamin D (CALCIUM-VITAMIN D) 600-125 MG-UNIT TABS Take by mouth.  . loratadine (CLARITIN REDITABS) 10 MG dissolvable tablet Take 10 mg by mouth at bedtime.  . montelukast (SINGULAIR) 10 MG tablet TAKE 1 TABLET BY MOUTH EVERYDAY AT BEDTIME (Patient taking differently: Take 10 mg by mouth at bedtime. )  .  Multiple Vitamins-Minerals ([place] 50+) CAPS   . omeprazole (PRILOSEC) 40 MG capsule TAKE 1 CAPSULE BY MOUTH EVERY DAY  . [DISCONTINUED] Calcium-Magnesium-Vitamin D (CALCIUM 1200+D3) 600-40-500 MG-MG-UNIT TB24   . [DISCONTINUED] Papaya Enzyme CHEW Chew 2 each by mouth 3 (three) times daily with meals.    No facility-administered encounter medications on file as of 11/21/2018.     Activities of Daily Living In your present state of health, do you have any difficulty performing the following activities: 11/21/2018 07/25/2018  Hearing? Y N  Comment pt c/o tinnitus and would like to have hearing evaluated -  Vision? N N  Difficulty concentrating or making decisions? N N  Walking or climbing stairs? Y Y  Dressing or bathing? N N  Doing errands, shopping? N N  Preparing Food and eating ? N -  Using the Toilet? N -  In the past six months, have you accidently leaked urine? N -  Comment wears pads for protection -  Do you have problems with loss of bowel control? N -  Managing your Medications? N -  Managing your Finances? N -  Housekeeping or managing your Housekeeping? N -  Some recent data might be hidden    Patient Care Team: Glean Hess, MD as PCP - General (Internal Medicine) Marlaine Hind, MD as Consulting Physician (Physical Medicine and Rehabilitation) Eustace Moore, MD as Consulting Physician (Neurosurgery) Tyler Pita, MD as Consulting Physician (Pulmonary Disease) Olean Ree, MD as Consulting Physician (General Surgery)    Assessment:   This is a routine wellness examination for Connie Mitchell.  Exercise Activities and Dietary recommendations Current Exercise Habits: Structured exercise class, Type of exercise: Other - see comments;strength training/weights;calisthenics(swimming ), Time (Minutes): 60, Frequency (Times/Week): 4, Weekly Exercise (Minutes/Week): 240, Intensity: Mild, Exercise limited by: orthopedic condition(s)  Goals    . DIET - INCREASE WATER  INTAKE     Recommend to drink at least 6-8 8oz glasses of water per day.       Fall Risk Fall Risk  11/21/2018 08/31/2018 08/31/2018 08/09/2018 08/02/2018  Falls in the past year? 0 0 0 0 0  Number falls in past yr: 0 0 0 - -  Injury with Fall? 0 0 0 - -  Risk for fall due to : Impaired balance/gait - - - -  Risk for fall due to: Comment - - - - -  Follow up Falls prevention discussed Falls evaluation completed Falls evaluation completed - -   FALL RISK PREVENTION PERTAINING TO THE HOME:  Any stairs in or around the home? Yes  If so, do they handrails? Yes   Home free of loose throw rugs in walkways, pet beds, electrical cords, etc? Yes  Adequate lighting in your home to reduce risk of falls? Yes   ASSISTIVE DEVICES UTILIZED TO PREVENT FALLS:  Life alert? No  Use of a cane, walker or w/c? Yes  Grab bars in the bathroom? No  Shower chair or bench in shower? Yes  Elevated toilet seat or a handicapped toilet? Yes   DME ORDERS:  DME order needed?  No   TIMED UP AND GO:  Was the test performed? No . Telephonic visit.  Education: Fall risk prevention has been discussed.  Intervention(s) required? No    Depression Screen PHQ 2/9 Scores 11/21/2018 08/31/2018 08/31/2018 08/01/2018  PHQ - 2 Score 0 0 0 0  PHQ- 9 Score - - - -     Cognitive Function     6CIT Screen 11/21/2018 11/17/2017  What Year? 0 points 0 points  What month? 0 points 0 points  What time? 0 points 0 points  Count back from 20 0 points 0 points  Months in reverse 0 points 0 points  Repeat phrase 0 points 0 points  Total Score 0 0    Immunization History  Administered Date(s) Administered  . Influenza Split 06/28/2012  . Influenza, High Dose Seasonal PF 03/14/2018  . Pneumococcal Conjugate-13 07/04/2014  . Pneumococcal Polysaccharide-23 06/16/2015  . Tdap 03/11/2012  . Zoster 02/25/2016    Qualifies for Shingles Vaccine? Yes  Zostavax completed 2017. Due for Shingrix. Education  has been provided  regarding the importance of this vaccine. Pt has been advised to call insurance company to determine out of pocket expense. Advised may also receive vaccine at local pharmacy or Health Dept. Verbalized acceptance and understanding.  Tdap: Up to date  Flu Vaccine: Up to date  Pneumococcal Vaccine: Up to date   Screening Tests Health Maintenance  Topic Date Due  . INFLUENZA VACCINE  01/14/2019  . MAMMOGRAM  06/30/2019  . TETANUS/TDAP  03/11/2022  . DEXA SCAN  Completed  . PNA vac Low Risk Adult  Completed    Cancer Screenings:  Colorectal Screening:  No longer required.   Mammogram: Completed 06/29/18. Repeat every year.  Bone Density: Completed 11/29/17. Results reflect OSTEOPENIA. Repeat every 2 years.   Lung Cancer Screening: (Low Dose CT Chest recommended if Age 51-80 years, 30 pack-year currently smoking OR have quit w/in 15years.) does not qualify.   Additional Screening:  Hepatitis C Screening: no longer required  Vision Screening: Recommended annual ophthalmology exams for early detection of glaucoma and other disorders of the eye. Is the patient up to date with their annual eye exam?  No  Who is the provider or what is the name of the office in which the pt attends annual eye exams? Not established If pt is not established with a provider, would they like to be referred to a provider to establish care? No .   Dental Screening: Recommended annual dental exams for proper oral hygiene  Community Resource Referral:  CRR required this visit?  No      Plan:     I have personally reviewed and addressed the Medicare Annual Wellness questionnaire and have noted the following in the patient's chart:  A. Medical and social history B. Use of alcohol, tobacco or illicit drugs  C. Current medications and supplements D. Functional ability and status E.  Nutritional status F.  Physical activity G. Advance directives H. List of other physicians I.  Hospitalizations,  surgeries, and ER visits in previous 12 months J.  [place] such as hearing and vision if needed, cognitive and depression L. Referrals and appointments   In addition, I have reviewed and discussed with patient certain preventive protocols, quality metrics, and best practice recommendations. A written personalized care plan for preventive services as well as general preventive health recommendations were provided to patient.   Signed,  [name], LPN Nurse Health Advisor   Nurse Notes: pt doing well and appreciative of visit today.

## 2018-11-21 NOTE — Patient Instructions (Signed)
Connie Mitchell , Thank you for taking time to come for your Medicare Wellness Visit. I appreciate your ongoing commitment to your health goals. Please review the following plan we discussed and let me know if I can assist you in the future.   Screening recommendations/referrals: Colonoscopy: no longer required Recommended yearly ophthalmology/optometry visit for glaucoma screening and checkup Recommended yearly dental visit for hygiene and checkup  Vaccinations: Influenza vaccine: done 03/14/18 Pneumococcal vaccine: done 2017 Tdap vaccine: done 03/11/12 Shingles vaccine: Shingrix discussed. Please contact your pharmacy for coverage information.   Advanced directives: Advance directive discussed with you today. Even though you declined this today please call our office should you change your mind and we can give you the proper paperwork for you to fill out.  Conditions/risks identified:   Free hearing clinics offered in [place]:   [facility] [place], [place], [place] [address] [phone]  [facility], [place], [place] [address] [phone]   Next appointment: Please follow up in one year for your Medicare Annual Wellness visit.    Preventive Care 83 Years and Older, Female Preventive care refers to lifestyle choices and visits with your health care provider that can promote health and wellness. What does preventive care include?  A yearly physical exam. This is also called an annual well check.  Dental exams once or twice a year.  Routine eye exams. Ask your health care provider how often you should have your eyes checked.  Personal lifestyle choices, including:  Daily care of your teeth and gums.  Regular physical activity.  Eating a healthy diet.  Avoiding tobacco and drug use.  Limiting alcohol use.  Practicing safe sex.  Taking low doses of aspirin every day.  Taking vitamin and mineral supplements as  recommended by your health care provider. What happens during an annual well check? The services and screenings done by your health care provider during your annual well check will depend on your age, overall health, lifestyle risk factors, and family history of disease. Counseling  Your health care provider may ask you questions about your:  Alcohol use.  Tobacco use.  Drug use.  Emotional well-being.  Home and relationship well-being.  Sexual activity.  Eating habits.  History of falls.  Memory and ability to understand (cognition).  Work and work Statistician. Screening  You may have the following tests or measurements:  Height, weight, and BMI.  Blood pressure.  Lipid and cholesterol levels. These may be checked every 5 years, or more frequently if you are over 74 years old.  Skin check.  Lung cancer screening. You may have this screening every year starting at age 17 if you have a 30-pack-year history of smoking and currently smoke or have quit within the past 15 years.  Fecal occult blood test (FOBT) of the stool. You may have this test every year starting at age 52.  Flexible sigmoidoscopy or colonoscopy. You may have a sigmoidoscopy every 5 years or a colonoscopy every 10 years starting at age 47.  Prostate cancer screening. Recommendations will vary depending on your family history and other risks.  Hepatitis C blood test.  Hepatitis B blood test.  Sexually transmitted disease (STD) testing.  Diabetes screening. This is done by checking your blood sugar (glucose) after you have not eaten for a while (fasting). You may have this done every 1-3 years.  Abdominal aortic aneurysm (AAA) screening. You may need this if you are a  current or former smoker.  Osteoporosis. You may be screened starting at age 39 if you are at high risk. Talk with your health care provider about your test results, treatment options, and if necessary, the need for more tests.  Vaccines  Your health care provider may recommend certain vaccines, such as:  Influenza vaccine. This is recommended every year.  Tetanus, diphtheria, and acellular pertussis (Tdap, Td) vaccine. You may need a Td booster every 10 years.  Zoster vaccine. You may need this after age 42.  Pneumococcal 13-valent conjugate (PCV13) vaccine. One dose is recommended after age 43.  Pneumococcal polysaccharide (PPSV23) vaccine. One dose is recommended after age 81. Talk to your health care provider about which screenings and vaccines you need and how often you need them. 83 This information is not intended to replace advice given to you by your health care provider. Make sure you discuss any questions you have with your health care provider. Document Released: 06/28/2015 Document Revised: 02/19/2016 Document Reviewed: 04/02/2015 Elsevier Interactive Patient Education  2017 [place] Prevention in the Home Falls can cause injuries. They can happen to people of all ages. There are many things you can do to make your home safe and to help prevent falls. What can I do on the outside of my home?  Regularly fix the edges of walkways and driveways and fix any cracks.  Remove anything that might make you trip as you walk through a door, such as a raised step or threshold.  Trim any bushes or trees on the path to your home.  Use bright outdoor lighting.  Clear any walking paths of anything that might make someone trip, such as rocks or tools.  Regularly check to see if handrails are loose or broken. Make sure that both sides of any steps have handrails.  Any raised decks and porches should have guardrails on the edges.  Have any leaves, snow, or ice cleared regularly.  Use sand or salt on walking paths during winter.  Clean up any spills in your garage right away. This includes oil or grease spills. What can I do in the bathroom?  Use night lights.  Install grab bars by the toilet  and in the tub and shower. Do not use towel bars as grab bars.  Use non-skid mats or decals in the tub or shower.  If you need to sit down in the shower, use a plastic, non-slip stool.  Keep the floor dry. Clean up any water that spills on the floor as soon as it happens.  Remove soap buildup in the tub or shower regularly.  Attach bath mats securely with double-sided non-slip rug tape.  Do not have throw rugs and other things on the floor that can make you trip. What can I do in the bedroom?  Use night lights.  Make sure that you have a light by your bed that is easy to reach.  Do not use any sheets or blankets that are too big for your bed. They should not hang down onto the floor.  Have a firm chair that has side arms. You can use this for support while you get dressed.  Do not have throw rugs and other things on the floor that can make you trip. What can I do in the kitchen?  Clean up any spills right away.  Avoid walking on wet floors.  Keep items that you use a lot in easy-to-reach places.  If you need to reach something above  you, use a strong step stool that has a grab bar.  Keep electrical cords out of the way.  Do not use floor polish or wax that makes floors slippery. If you must use wax, use non-skid floor wax.  Do not have throw rugs and other things on the floor that can make you trip. What can I do with my stairs?  Do not leave any items on the stairs.  Make sure that there are handrails on both sides of the stairs and use them. Fix handrails that are broken or loose. Make sure that handrails are as long as the stairways.  Check any carpeting to make sure that it is firmly attached to the stairs. Fix any carpet that is loose or worn.  Avoid having throw rugs at the top or bottom of the stairs. If you do have throw rugs, attach them to the floor with carpet tape.  Make sure that you have a light switch at the top of the stairs and the bottom of the  stairs. If you do not have them, ask someone to add them for you. What else can I do to help prevent falls?  Wear shoes that:  Do not have high heels.  Have rubber bottoms.  Are comfortable and fit you well.  Are closed at the toe. Do not wear sandals.  If you use a stepladder:  Make sure that it is fully opened. Do not climb a closed stepladder.  Make sure that both sides of the stepladder are locked into place.  Ask someone to hold it for you, if possible.  Clearly mark and make sure that you can see:  Any grab bars or handrails.  First and last steps.  Where the edge of each step is.  Use tools that help you move around (mobility aids) if they are needed. These include:  Canes.  Walkers.  Scooters.  Crutches.  Turn on the lights when you go into a dark area. Replace any light bulbs as soon as they burn out.  Set up your furniture so you have a clear path. Avoid moving your furniture around.  If any of your floors are uneven, fix them.  If there are any pets around you, be aware of where they are.  Review your medicines with your doctor. Some medicines can make you feel dizzy. This can increase your chance of falling. Ask your doctor what other things that you can do to help prevent falls. 83 This information is not intended to replace advice given to you by your health care provider. Make sure you discuss any questions you have with your health care provider. Document Released: 03/28/2009 Document Revised: 11/07/2015 Document Reviewed: 07/06/2014 Elsevier Interactive Patient Education  2017 [employer].

## 2018-11-28 ENCOUNTER — Ambulatory Visit (INDEPENDENT_AMBULATORY_CARE_PROVIDER_SITE_OTHER): Payer: Medicare Other | Admitting: Internal Medicine

## 2018-11-28 ENCOUNTER — Encounter: Payer: Self-pay | Admitting: Internal Medicine

## 2018-11-28 ENCOUNTER — Other Ambulatory Visit: Payer: Self-pay

## 2018-11-28 VITALS — BP 132/76 | HR 81 | Ht 63.0 in | Wt 179.0 lb

## 2018-11-28 DIAGNOSIS — C50012 Malignant neoplasm of nipple and areola, left female breast: Secondary | ICD-10-CM | POA: Diagnosis not present

## 2018-11-28 DIAGNOSIS — I712 Thoracic aortic aneurysm, without rupture, unspecified: Secondary | ICD-10-CM

## 2018-11-28 DIAGNOSIS — R058 Other specified cough: Secondary | ICD-10-CM

## 2018-11-28 DIAGNOSIS — M810 Age-related osteoporosis without current pathological fracture: Secondary | ICD-10-CM | POA: Diagnosis not present

## 2018-11-28 DIAGNOSIS — R05 Cough: Secondary | ICD-10-CM | POA: Diagnosis not present

## 2018-11-28 MED ORDER — MONTELUKAST SODIUM 10 MG PO TABS: 10.0000 mg | ORAL_TABLET | Freq: Every day | ORAL | 1 refills | Status: DC

## 2018-11-28 NOTE — Progress Notes (Signed)
Date:  11/28/2018   Name:  Connie Mitchell   DOB:  [birth year]   MRN:  [id number]   Chief Complaint: Aneurysm  Thoracic aneurysm - noted to be 4.6 cm 6 months ago on CT staging of breast cancer.  She denies chest pain, dizziness, focal weakness or shortness of breast.  She continues to cut the grass and exercise at the pool.  Breast Cancer - recently seen by Oncology.  S/p mastectomy for T1bN0 and clear margins.  She decided against chemotherapy.  She is considering Femara.  Osteoporosis - instructed to take calcium and vitamin D.  She is considering Prolia but needs dental clearance which has been delayed by Covid-19.  Review of Systems  Constitutional: Negative for chills, fatigue and fever.  HENT: Positive for postnasal drip and sore throat.   Respiratory: Positive for shortness of breath. Negative for cough, chest tightness and wheezing.   Cardiovascular: Negative for chest pain, palpitations and leg swelling.  Gastrointestinal: Negative for abdominal pain.  Neurological: Negative for dizziness and headaches.  Psychiatric/Behavioral: Negative for dysphoric mood and sleep disturbance. The patient is not nervous/anxious.     Patient Active Problem List   Diagnosis Date Noted   Breast cancer, left ([place]) 07/25/2018   Osteopenia of neck of right femur 07/12/2018   Malignant neoplasm of upper-outer quadrant of left female breast ([place]) 07/07/2018   Thoracic ascending aortic aneurysm ([place]) 05/27/2018   LPRD (laryngopharyngeal reflux disease) 03/17/2018   Post-nasal drip 03/17/2018   Allergic cough 11/25/2017   Arthritis involving multiple sites 11/25/2017   Chronic foot pain 11/25/2017   Lumbar degenerative disc disease 09/24/2017   Hx of Lyme disease 09/24/2017   Hx of basal cell carcinoma 09/24/2017   Elevated blood pressure reading 09/24/2017   Nuclear sclerotic cataract of left eye 08/09/2015   Osteoporosis 07/04/2014   Primary localized osteoarthrosis of  shoulder region 12/13/2012   History of breast cancer 12/18/2011   Osteoarthritis 12/18/2011    Allergies  Allergen Reactions   Ciprofloxacin Nausea And Vomiting    Past Surgical History:  Procedure Laterality Date   BREAST LUMPECTOMY Left 1991   breast ca, lumpectomy   cataracts Bilateral    CYSTOCELE REPAIR  2010   EYE SURGERY Bilateral 2017   cataracts extracted   JOINT REPLACEMENT     MASTECTOMY W/ SENTINEL NODE BIOPSY Left 07/25/2018   Procedure: MASTECTOMY WITH SENTINEL LYMPH NODE BIOPSY;  Surgeon: Olean Ree, MD;  Location: [facility] ORS;  Service: General;  Laterality: Left;   OOPHORECTOMY  1995   ovarian mass - benign   REPLACEMENT TOTAL KNEE Bilateral 2012,2013   SKIN BIOPSY Left    arm growth   TONSILLECTOMY  1958   TOTAL SHOULDER ARTHROPLASTY Right 2016    Social History   Tobacco Use   Smoking status: Never Smoker   Smokeless tobacco: Never Used   Tobacco comment: smoking cessation materials not required  Substance Use Topics   Alcohol use: Yes    Comment: occasional   Drug use: Never     Medication list has been reviewed and updated.  Current Meds  Medication Sig   Calcium Carbonate-Vitamin D (CALCIUM-VITAMIN D) 600-125 MG-UNIT TABS Take by mouth.   loratadine (CLARITIN REDITABS) 10 MG dissolvable tablet Take 10 mg by mouth at bedtime.   montelukast (SINGULAIR) 10 MG tablet TAKE 1 TABLET BY MOUTH EVERYDAY AT BEDTIME (Patient taking differently: Take 10 mg by mouth at bedtime. )   Multiple Vitamins-Minerals ([place] 50+) CAPS  omeprazole (PRILOSEC) 40 MG capsule TAKE 1 CAPSULE BY MOUTH EVERY DAY    PHQ 2/9 Scores 11/28/2018 11/21/2018 08/31/2018 08/31/2018  PHQ - 2 Score 1 0 0 0  PHQ- 9 Score - - - -    BP Readings from Last 3 Encounters:  11/28/18 132/76  11/21/18 128/79  08/31/18 98/64    Physical Exam Vitals signs and nursing note reviewed.  Constitutional:      General: She is not in acute distress.     Appearance: Normal appearance. She is well-developed.  HENT:     Head: Normocephalic and atraumatic.  Neck:     Vascular: No carotid bruit.  Cardiovascular:     Rate and Rhythm: Normal rate and regular rhythm.     Pulses: Normal pulses.  Pulmonary:     Effort: Pulmonary effort is normal. No respiratory distress.     Breath sounds: No wheezing or rhonchi.  Musculoskeletal: Normal range of motion.     Right lower leg: No edema.     Left lower leg: No edema.  Lymphadenopathy:     Cervical: No cervical adenopathy.  Skin:    General: Skin is warm and dry.     Capillary Refill: Capillary refill takes less than 2 seconds.     Findings: No rash.  Neurological:     Mental Status: She is alert and oriented to person, place, and time.  Psychiatric:        Behavior: Behavior normal.        Thought Content: Thought content normal.     Wt Readings from Last 3 Encounters:  11/28/18 179 lb (81.2 kg)  11/21/18 174 lb (78.9 kg)  08/31/18 181 lb (82.1 kg)    BP 132/76    Pulse 81    Ht 5\' 3"  (1.6 m)    Wt 179 lb (81.2 kg)    SpO2 94%    BMI 31.71 kg/m   Assessment and Plan: 1. Thoracic aortic aneurysm without rupture ([place]) Will repeat CTA and refer to VS if larger - CT Angio Chest W/Cm &/Or Wo Cm; Future  2. Osteoporosis, unspecified osteoporosis type, unspecified pathological fracture presence Planning to start Prolia  Will need dental clearance  3. Malignant neoplasm of areola of left breast in female, unspecified estrogen receptor status ([place]) Doing well s/p mastectomy w/o follow up chemotherapy Considering AI therapy  4. Allergic cough Stable with intermittent sore throat - montelukast (SINGULAIR) 10 MG tablet; Take 1 tablet (10 mg total) by mouth at bedtime.  Dispense: 90 tablet; Refill: 1   Partially dictated using Editor, commissioning. Any errors are unintentional.  Halina Maidens, MD [facility]  11/28/2018

## 2018-12-05 ENCOUNTER — Other Ambulatory Visit: Payer: Self-pay

## 2018-12-05 ENCOUNTER — Other Ambulatory Visit: Payer: Self-pay | Admitting: Internal Medicine

## 2018-12-05 ENCOUNTER — Telehealth: Payer: Self-pay | Admitting: Internal Medicine

## 2018-12-05 ENCOUNTER — Ambulatory Visit
Admission: RE | Admit: 2018-12-05 | Discharge: 2018-12-05 | Disposition: A | Discharge status: Home / Self Care | Payer: Medicare Other | Source: Ambulatory Visit | Attending: Internal Medicine | Admitting: Internal Medicine

## 2018-12-05 ENCOUNTER — Other Ambulatory Visit
Admission: RE | Admit: 2018-12-05 | Discharge: 2018-12-05 | Disposition: A | Discharge status: Home / Self Care | Payer: Medicare Other | Source: Home / Self Care | Attending: Internal Medicine | Admitting: Internal Medicine

## 2018-12-05 DIAGNOSIS — I712 Thoracic aortic aneurysm, without rupture, unspecified: Secondary | ICD-10-CM

## 2018-12-05 DIAGNOSIS — R030 Elevated blood-pressure reading, without diagnosis of hypertension: Secondary | ICD-10-CM

## 2018-12-05 DIAGNOSIS — I700 Atherosclerosis of aorta: Secondary | ICD-10-CM | POA: Diagnosis not present

## 2018-12-05 LAB — CREATININE, SERUM
Creatinine, Ser: 0.68 mg/dL (ref 0.44–1.00)
GFR calc Af Amer: >60 mL/min (ref >60)
GFR calc non Af Amer: >60 mL/min (ref >60)

## 2018-12-05 MED ORDER — IOPAMIDOL (ISOVUE-370) INJECTION 76%
75.0000 mL | Freq: Once | INTRAVENOUS | Status: AC | PRN
  Administered 2018-12-05: 75 mL via INTRAVENOUS

## 2018-12-05 NOTE — Telephone Encounter (Signed)
Called and spoke with pt. Tuesday she had a pain behind left shoulder. Went away 2 days later. Came back yesterday with severe pain. Could not sleep.  Been exercising in the swimming pool.  Started having headache on Tuesday and had headache ever since on Left side of head.  Patient wants advise that this is concerning. Should she come in to be checked out? No chest pain or SOB. 126/76- BP  Had CT today downstairs.

## 2018-12-05 NOTE — Telephone Encounter (Signed)
Pt walked in wanting to talk to you about some things that have come up and is not sure if she should be worried about it, if you could please give her a call.

## 2018-12-05 NOTE — Telephone Encounter (Signed)
This sounds like muscle strain to me.  I recommend tylenol/advil and heat or ice as needed.  The headache may also be from muscle tension radiating from the upper back to the scalp. Her MRA shows that the aneurysm is the same size and they recommend referral to a Vascular Surgeon. They also see two fluid collections at the mastectomy site and an lymph node in the axillary region.  I will send a message to Oncology to look at the scan to see what they think.

## 2018-12-05 NOTE — Telephone Encounter (Signed)
Called and informed patient of this exact note. Told her if pain is no better to come see [place] next week if no improvement. Also informed her we will call her or oncology will call her about fluid, and lymph node.

## 2018-12-08 ENCOUNTER — Other Ambulatory Visit: Payer: Self-pay

## 2018-12-08 DIAGNOSIS — C50412 Malignant neoplasm of upper-outer quadrant of left female breast: Secondary | ICD-10-CM

## 2018-12-08 DIAGNOSIS — Z170 Estrogen receptor positive status [ER+]: Secondary | ICD-10-CM

## 2018-12-13 NOTE — Progress Notes (Signed)
[facility]  [address] [place], [place] [address] Phone: [phone]  Fax: [phone]   Clinic Day:  12/15/2018  Referring physician: Glean Hess, MD  Chief Complaint: Connie Mitchell is a 85 y.o. female with stage IA left breast cancer who is seen for 4 month assessment after interval mastectomy.  HPI: The patient was last seen in the medical oncology clinic on 08/09/2018. At that time, she felt tired today. She was doing well s/p mastectomy. She had a drain in place.  She was started on letrozole.   The patient was presented to [facility] for abdominal pain and frequent soft stools with clear mucus for several days. She had no appetite and a low grade fever. She denied any other symptoms. Gastroenteritis symptoms were resolved on 08/31/2018.  She was seen in the [facility] on 11/28/2018 for evaluation of a thoracic aneurysm.  Chest CT angiogram on 12/05/2018 revealed unchanged ascending thoracic aortic aneurysm measuring up to 4.5 cm.  Interval left mastectomy with 1.6 x 8.6 x 2.7 cm seroma. Round 1.2 x 1.9 cm fluid density lesion in the inferior left axilla also likely represents a seroma at the site of interval sentinel lymph node dissection. New borderline enlarged right axillary lymph node measuring 10 mm in short axis, nonspecific.   During the interim, the patient has done well.  She exercises in pool to stay active. She reports chronic cough, back pain, and fatigue. She denies any inflammation in her shoulders.   She never took letrozole (Femara) as recommended because of the side affects she read. She reports not taking any other medications.   Past Medical History:  Diagnosis Date  . Cancer ([place]) 1991   left breast. treated with rads and lumpectomy  . Cancer ([place]) 2020   newly diagnosed, also left breast  . Cellulitis and abscess of toe of left foot 01/2018  . GERD (gastroesophageal reflux disease)   . Hx of  therapeutic radiation 1991   left breast  . Laryngopharyngeal reflux    sometimes food goes down wrong way and she ends up in extreme coughing/choking scenario  . Personal history of radiation therapy 1991   left breast ca  . Primary osteoarthritis   . Thoracic aortic aneurysm (TAA) ([place])     Past Surgical History:  Procedure Laterality Date  . BREAST LUMPECTOMY Left 1991   breast ca, lumpectomy  . cataracts Bilateral   . CYSTOCELE REPAIR  2010  . EYE SURGERY Bilateral 2017   cataracts extracted  . JOINT REPLACEMENT    . MASTECTOMY W/ SENTINEL NODE BIOPSY Left 07/25/2018   Procedure: MASTECTOMY WITH SENTINEL LYMPH NODE BIOPSY;  Surgeon: Olean Ree, MD;  Location: [facility] ORS;  Service: General;  Laterality: Left;  . OOPHORECTOMY  1995   ovarian mass - benign  . REPLACEMENT TOTAL KNEE Bilateral 2012,2013  . SKIN BIOPSY Left    arm growth  . TONSILLECTOMY  1958  . TOTAL SHOULDER ARTHROPLASTY Right 2016    Family History  Problem Relation Age of Onset  . Hypertension Mother   . Alzheimer's disease Mother   . Stroke Father   . Cancer Brother        prostate  . Atrial fibrillation Brother   . Cancer Other        breast  . Atrial fibrillation Brother   . Breast cancer Cousin 72       paternal x 2    Social History:  reports  that she has never smoked. She has never used smokeless tobacco. She reports current alcohol use. She reports that she does not use drugs. 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 She lives in [place]. She consumes minimal amounts of ETOH on an infrequent basis. She has never smoked. Patient denies known exposures to radiation on toxins. 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  Patient has 1 cat and 2 dogs. The patient is alone today.  Allergies:  Allergies  Allergen Reactions  . Ciprofloxacin Nausea And Vomiting    Current Medications: Current Outpatient Medications  Medication Sig Dispense Refill  . Calcium Carbonate-Vitamin D  (CALCIUM-VITAMIN D) 600-125 MG-UNIT TABS Take 1 tablet by mouth 2 (two) times a day.     . loratadine (CLARITIN REDITABS) 10 MG dissolvable tablet Take 10 mg by mouth at bedtime.    . montelukast (SINGULAIR) 10 MG tablet Take 1 tablet (10 mg total) by mouth at bedtime. 90 tablet 1  . Multiple Vitamins-Minerals ([place] 50+) CAPS     . omeprazole (PRILOSEC) 40 MG capsule TAKE 1 CAPSULE BY MOUTH EVERY DAY 30 capsule 5   No current facility-administered medications for this visit.     Review of Systems  Constitutional: Positive for malaise/fatigue. Negative for chills, diaphoresis, fever and weight loss.       Doing well.  HENT: Negative.  Negative for congestion, ear pain, hearing loss, nosebleeds, sinus pain and sore throat.   Eyes: Negative.  Negative for blurred vision, double vision, photophobia and pain.  Respiratory: Positive for cough (chronic). Negative for hemoptysis, sputum production and wheezing.   Cardiovascular: Negative for chest pain, palpitations, orthopnea and PND.       Echo on 06/20/2018 revealed an EF of 60-65%.  Gastrointestinal: Negative.  Negative for abdominal pain, blood in stool, constipation, diarrhea, heartburn, melena, nausea and vomiting.  Genitourinary: Negative.  Negative for dysuria, frequency and hematuria.  Musculoskeletal: Positive for back pain (chronic). Negative for falls and myalgias.       S/p bilateral knee and right shoulder. Scoliosis.  Skin: Negative for itching and rash.       Mastectomy site healed well.   Neurological: Negative.  Negative for dizziness, tingling, sensory change, speech change, focal weakness, weakness and headaches.  Endo/Heme/Allergies: Negative.   Psychiatric/Behavioral: Negative.  Negative for depression and memory loss. The patient is not nervous/anxious and does not have insomnia.   All other systems reviewed and are negative.  Performance status (ECOG): 1  Vital Signs Blood pressure 132/84, pulse 65,  temperature (!) 97.4 F (36.3 C), temperature source Tympanic, resp. rate 16, weight 177 lb 7.5 oz (80.5 kg).   Physical Exam  Constitutional: She is oriented to person, place, and time. She appears well-developed and well-nourished. No distress.  Elderly woman sitting comfortably in the exam room in no acute distress.  She has a cane at her side.  HENT:  Head: Normocephalic and atraumatic.  Mouth/Throat: Oropharynx is clear and moist. No oropharyngeal exudate.  Short gray hair.  Mask.  Eyes: Pupils are equal, round, and reactive to light. Conjunctivae and EOM are normal. No scleral icterus.  Blue eyes s/p cataract surgery.  Neck: Normal range of motion. Neck supple. No JVD present.  Cardiovascular: Normal rate, regular rhythm and normal heart sounds. Exam reveals no gallop and no friction rub.  No murmur heard. Pulmonary/Chest: Effort normal and breath sounds normal. No respiratory distress. She has no wheezes. She has no rales. She exhibits no tenderness. Breasts are asymmetrical (  left sided seroma).  Abdominal: Soft. Bowel sounds are normal. She exhibits no distension and no mass. There is no abdominal tenderness. There is no rebound and no guarding.  Musculoskeletal: Normal range of motion.        General: No tenderness or edema.  Lymphadenopathy:    She has no cervical adenopathy.    She has axillary adenopathy (barely palpable right axillary node).       Right: No supraclavicular adenopathy present.       Left: No supraclavicular adenopathy present.  Neurological: She is alert and oriented to person, place, and time. She has normal reflexes.  Skin: Skin is warm and dry. No rash noted. She is not diaphoretic. No erythema. No pallor.  Psychiatric: She has a normal mood and affect. Her behavior is normal. Judgment and thought content normal.  Nursing note and vitals reviewed.   Appointment on 12/15/2018  Component Date Value Ref Range Status  . WBC 12/15/2018 6.4  4.0 - 10.5 K/uL  Final  . RBC 12/15/2018 4.79  3.87 - 5.11 MIL/uL Final  . Hemoglobin 12/15/2018 14.9  12.0 - 15.0 g/dL Final  . HCT 12/15/2018 43.5  36.0 - 46.0 % Final  . MCV 12/15/2018 90.8  80.0 - 100.0 fL Final  . MCH 12/15/2018 31.1  26.0 - 34.0 pg Final  . MCHC 12/15/2018 34.3  30.0 - 36.0 g/dL Final  . RDW 12/15/2018 15.0  11.5 - 15.5 % Final  . Platelets 12/15/2018 230  150 - 400 K/uL Final   PLATELET COUNT PERFORMED ON CITRATED BLOOD  . nRBC 12/15/2018 0.0  0.0 - 0.2 % Final  . Neutrophils Relative % 12/15/2018 61  % Final  . Neutro Abs 12/15/2018 4.0  1.7 - 7.7 K/uL Final  . Lymphocytes Relative 12/15/2018 27  % Final  . Lymphs Abs 12/15/2018 1.7  0.7 - 4.0 K/uL Final  . Monocytes Relative 12/15/2018 7  % Final  . Monocytes Absolute 12/15/2018 0.4  0.1 - 1.0 K/uL Final  . Eosinophils Relative 12/15/2018 4  % Final  . Eosinophils Absolute 12/15/2018 0.2  0.0 - 0.5 K/uL Final  . Basophils Relative 12/15/2018 1  % Final  . Basophils Absolute 12/15/2018 0.0  0.0 - 0.1 K/uL Final  . Immature Granulocytes 12/15/2018 0  % Final  . Abs Immature Granulocytes 12/15/2018 0.01  0.00 - 0.07 K/uL Final   Performed at [facility], [address], [place], [place] [address]  . Sodium 12/15/2018 137  135 - 145 mmol/L Final  . Potassium 12/15/2018 4.1  3.5 - 5.1 mmol/L Final  . Chloride 12/15/2018 103  98 - 111 mmol/L Final  . CO2 12/15/2018 25  22 - 32 mmol/L Final  . Glucose, Bld 12/15/2018 88  70 - 99 mg/dL Final  . BUN 12/15/2018 20  8 - 23 mg/dL Final  . Creatinine, Ser 12/15/2018 0.58  0.44 - 1.00 mg/dL Final  . Calcium 12/15/2018 9.1  8.9 - 10.3 mg/dL Final  . Total Protein 12/15/2018 7.0  6.5 - 8.1 g/dL Final  . Albumin 12/15/2018 4.0  3.5 - 5.0 g/dL Final  . AST 12/15/2018 23  15 - 41 U/L Final  . ALT 12/15/2018 26  0 - 44 U/L Final  . Alkaline Phosphatase 12/15/2018 68  38 - 126 U/L Final  . Total Bilirubin 12/15/2018 0.6  0.3 - 1.2 mg/dL Final  . GFR calc non Af Amer 12/15/2018  >60  >60 mL/min Final  . GFR calc  Af Amer 12/15/2018 >60  >60 mL/min Final  . Anion gap 12/15/2018 9  5 - 15 Final   Performed at [facility], [address] [place], [place] [address]    Assessment:  Lauralyn Shadowens is a 85 y.o. female with stage IA left breast cancer s/p mastectomy and sentinel lymph node biopsy on 07/25/2018.  Pathology revealed a 10 x 8 x 7 mm grade 1 invasive mammary carcinoma.  Margins were negative.  Four sentinel lymph nodes were negative.  Tumor is ER + (> 90%), PR+ (> 90%), and Her2/neu 1+.  Pathologic stage was T1bN0(sn).  CA27.29 was 30.3 on 07/07/2018.  Chest CT on 05/10/2018 revealed a 1.3 cm left breast mass.   Bilateral mammogram and ultrasound on 06/29/2018 revealed lumpectomy changes in the lateral left breast.  There was 1.8 x 1.7 x 1.0 cm lobulated mass in the upper outer quadrant.  There was no enlarged adenopathy.  She has a history of left breast cancer s/p lumpectomy in 1991.  She received radiation.  She declined chemotherapy and endocrine therapy.  Chest CT angiogram on 12/05/2018 revealed unchanged ascending thoracic aortic aneurysm measuring up to 4.5 cm.  Interval left mastectomy with 1.6 x 8.6 x 2.7 cm seroma.  Round 1.2 x 1.9 cm fluid density lesion in the inferior left axilla also likely represents a seroma at the site of interval sentinel lymph node dissection. New borderline enlarged right axillary lymph node measuring 10 mm in short axis, nonspecific.  Bone density on 11/29/2017 revealed osteopenia with a T-score of -2.1 in the right femur.  Symptomatically, she is doing well.  Exam reveals a palpable left sided seroma.  Plan: 1.   Labs today: CBC with diff, platelet count in blue top tube (secondary to clumping), CMP. 2.   Clinical stage IA left breast cancer             Clinically she is doing well.    Exam reveals no evidence of recurrent disease.    Discuss plan for endocrine therapy.    Patient has made the decision to  forego endocrine therapy.             Multiple options were discussed.  The patient remained steadfast in her decision not to proceed with hormonal therapy. 3.   Osteopenia             Continue calcium and vitamin D.             Discuss consideration of Prolia.  Potential side effects reviewed.             Discuss need for dental clearance.             Preauth Prolia. 4.   Right axillary lymph node  Noted on CT imaging on 12/05/2018.  Follow-up right axillary ultrasound.  Discuss plans for lymph node biopsy if node does not resolve or increases in size 5.   RTC in 1 month for review of ultrasound (Doximetry).  I discussed the assessment and treatment plan with the patient.  The patient was provided an opportunity to ask questions and all were answered.  The patient agreed with the plan and demonstrated an understanding of the instructions.  The patient was advised to call back if the symptoms worsen or if the condition fails to improve as anticipated.   Lequita Asal, MD, PhD    12/15/2018, 11:29 AM  I, Selena Batten, am acting as scribe for [employer]. Mike Gip, MD, PhD.  I, Melissa C. Mike Gip, MD, have reviewed the above documentation for accuracy and completeness, and I agree with the above.

## 2018-12-15 ENCOUNTER — Other Ambulatory Visit: Payer: Self-pay

## 2018-12-15 ENCOUNTER — Inpatient Hospital Stay: Payer: Medicare Other | Attending: Hematology and Oncology

## 2018-12-15 ENCOUNTER — Ambulatory Visit: Payer: Medicare Other

## 2018-12-15 ENCOUNTER — Encounter: Payer: Self-pay | Admitting: Hematology and Oncology

## 2018-12-15 ENCOUNTER — Inpatient Hospital Stay (HOSPITAL_BASED_OUTPATIENT_CLINIC_OR_DEPARTMENT_OTHER): Payer: Medicare Other | Admitting: Hematology and Oncology

## 2018-12-15 VITALS — BP 132/84 | HR 65 | Temp 97.4°F | Resp 16 | Wt 177.5 lb

## 2018-12-15 DIAGNOSIS — Z8042 Family history of malignant neoplasm of prostate: Secondary | ICD-10-CM | POA: Insufficient documentation

## 2018-12-15 DIAGNOSIS — C50412 Malignant neoplasm of upper-outer quadrant of left female breast: Secondary | ICD-10-CM | POA: Diagnosis not present

## 2018-12-15 DIAGNOSIS — M8580 Other specified disorders of bone density and structure, unspecified site: Secondary | ICD-10-CM | POA: Insufficient documentation

## 2018-12-15 DIAGNOSIS — Z170 Estrogen receptor positive status [ER+]: Secondary | ICD-10-CM

## 2018-12-15 DIAGNOSIS — Z79899 Other long term (current) drug therapy: Secondary | ICD-10-CM | POA: Insufficient documentation

## 2018-12-15 DIAGNOSIS — I712 Thoracic aortic aneurysm, without rupture: Secondary | ICD-10-CM

## 2018-12-15 DIAGNOSIS — R590 Localized enlarged lymph nodes: Secondary | ICD-10-CM | POA: Diagnosis not present

## 2018-12-15 DIAGNOSIS — M85851 Other specified disorders of bone density and structure, right thigh: Secondary | ICD-10-CM

## 2018-12-15 DIAGNOSIS — M549 Dorsalgia, unspecified: Secondary | ICD-10-CM | POA: Insufficient documentation

## 2018-12-15 DIAGNOSIS — Z803 Family history of malignant neoplasm of breast: Secondary | ICD-10-CM

## 2018-12-15 DIAGNOSIS — R5383 Other fatigue: Secondary | ICD-10-CM | POA: Diagnosis not present

## 2018-12-15 DIAGNOSIS — R05 Cough: Secondary | ICD-10-CM | POA: Insufficient documentation

## 2018-12-15 DIAGNOSIS — Z9012 Acquired absence of left breast and nipple: Secondary | ICD-10-CM | POA: Diagnosis not present

## 2018-12-15 DIAGNOSIS — K219 Gastro-esophageal reflux disease without esophagitis: Secondary | ICD-10-CM | POA: Insufficient documentation

## 2018-12-15 DIAGNOSIS — Z853 Personal history of malignant neoplasm of breast: Secondary | ICD-10-CM

## 2018-12-15 LAB — CBC WITH DIFFERENTIAL/PLATELET
Abs Immature Granulocytes: 0.01 10*3/uL (ref 0.00–0.07)
Basophils Absolute: 0 10*3/uL (ref 0.0–0.1)
Basophils Relative: 1 %
Eosinophils Absolute: 0.2 10*3/uL (ref 0.0–0.5)
Eosinophils Relative: 4 %
HCT: 43.5 % (ref 36.0–46.0)
Hemoglobin: 14.9 g/dL (ref 12.0–15.0)
Immature Granulocytes: 0 %
Lymphocytes Relative: 27 %
Lymphs Abs: 1.7 10*3/uL (ref 0.7–4.0)
MCH: 31.1 pg (ref 26.0–34.0)
MCHC: 34.3 g/dL (ref 30.0–36.0)
MCV: 90.8 fL (ref 80.0–100.0)
Monocytes Absolute: 0.4 10*3/uL (ref 0.1–1.0)
Monocytes Relative: 7 %
Neutro Abs: 4 10*3/uL (ref 1.7–7.7)
Neutrophils Relative %: 61 %
Platelets: 230 10*3/uL (ref 150–400)
RBC: 4.79 MIL/uL (ref 3.87–5.11)
RDW: 15 % (ref 11.5–15.5)
WBC: 6.4 10*3/uL (ref 4.0–10.5)
nRBC: 0 % (ref 0.0–0.2)

## 2018-12-15 LAB — COMPREHENSIVE METABOLIC PANEL
ALT: 26 U/L (ref 0–44)
AST: 23 U/L (ref 15–41)
Albumin: 4 g/dL (ref 3.5–5.0)
Alkaline Phosphatase: 68 U/L (ref 38–126)
Anion gap: 9 (ref 5–15)
BUN: 20 mg/dL (ref 8–23)
CO2: 25 mmol/L (ref 22–32)
Calcium: 9.1 mg/dL (ref 8.9–10.3)
Chloride: 103 mmol/L (ref 98–111)
Creatinine, Ser: 0.58 mg/dL (ref 0.44–1.00)
GFR calc Af Amer: >60 mL/min (ref >60)
GFR calc non Af Amer: >60 mL/min (ref >60)
Glucose, Bld: 88 mg/dL (ref 70–99)
Potassium: 4.1 mmol/L (ref 3.5–5.1)
Sodium: 137 mmol/L (ref 135–145)
Total Bilirubin: 0.6 mg/dL (ref 0.3–1.2)
Total Protein: 7 g/dL (ref 6.5–8.1)

## 2018-12-15 NOTE — Progress Notes (Signed)
Pt here for follow up. Denies any concerns.  

## 2018-12-29 ENCOUNTER — Encounter (INDEPENDENT_AMBULATORY_CARE_PROVIDER_SITE_OTHER): Payer: Self-pay | Admitting: Vascular Surgery

## 2018-12-29 ENCOUNTER — Ambulatory Visit
Admission: RE | Admit: 2018-12-29 | Discharge: 2018-12-29 | Disposition: A | Discharge status: Home / Self Care | Payer: Medicare Other | Source: Ambulatory Visit | Attending: Hematology and Oncology | Admitting: Hematology and Oncology

## 2018-12-29 ENCOUNTER — Ambulatory Visit (INDEPENDENT_AMBULATORY_CARE_PROVIDER_SITE_OTHER): Payer: Medicare Other | Admitting: Vascular Surgery

## 2018-12-29 ENCOUNTER — Other Ambulatory Visit: Payer: Self-pay

## 2018-12-29 VITALS — BP 137/83 | HR 73 | Resp 17 | Ht 63.0 in | Wt 176.0 lb

## 2018-12-29 DIAGNOSIS — Z791 Long term (current) use of non-steroidal anti-inflammatories (NSAID): Secondary | ICD-10-CM

## 2018-12-29 DIAGNOSIS — I724 Aneurysm of artery of lower extremity: Secondary | ICD-10-CM

## 2018-12-29 DIAGNOSIS — I7121 Aneurysm of the ascending aorta, without rupture: Secondary | ICD-10-CM

## 2018-12-29 DIAGNOSIS — R198 Other specified symptoms and signs involving the digestive system and abdomen: Secondary | ICD-10-CM | POA: Diagnosis not present

## 2018-12-29 DIAGNOSIS — Z170 Estrogen receptor positive status [ER+]: Secondary | ICD-10-CM | POA: Diagnosis not present

## 2018-12-29 DIAGNOSIS — M150 Primary generalized (osteo)arthritis: Secondary | ICD-10-CM

## 2018-12-29 DIAGNOSIS — R928 Other abnormal and inconclusive findings on diagnostic imaging of breast: Secondary | ICD-10-CM | POA: Diagnosis not present

## 2018-12-29 DIAGNOSIS — N6489 Other specified disorders of breast: Secondary | ICD-10-CM | POA: Diagnosis not present

## 2018-12-29 DIAGNOSIS — C50412 Malignant neoplasm of upper-outer quadrant of left female breast: Secondary | ICD-10-CM | POA: Insufficient documentation

## 2018-12-29 DIAGNOSIS — I712 Thoracic aortic aneurysm, without rupture: Secondary | ICD-10-CM | POA: Diagnosis not present

## 2018-12-29 DIAGNOSIS — M159 Polyosteoarthritis, unspecified: Secondary | ICD-10-CM

## 2018-12-29 NOTE — Progress Notes (Signed)
MRN : [id number]  Shandrell Boda is a 85 y.o. (11/04/1933) female who presents with chief complaint of  Chief Complaint  Patient presents with   New Patient (Initial Visit)    Thoracic aneurysm  .  History of Present Illness:   The patient presents to the office for evaluation of an thoracic aortic aneurysm. The aneurysm was found incidentally by CT scan approximately 1 year ago. Patient denies chest, abdominal pain or unusual back pain, no other abdominal complaints.  No history of an acute onset of painful blue discoloration of the toes.  She recently had a follow-up CT scan of the chest and incidental notation was again made of the thoracic aneurysm.  Is a sending.  It is unchanged in diameter.  However, this did prompt a vascular consult.  No family history of TAA or AAA.   Patient denies amaurosis fugax or TIA symptoms. There is no history of claudication or rest pain symptoms of the lower extremities.  The patient denies angina or shortness of breath.  CT scan shows an ascending thoracic aortic aneurysm that measures 4.50 cm  Current Meds  Medication Sig   Calcium Carbonate-Vitamin D (CALCIUM-VITAMIN D) 600-125 MG-UNIT TABS Take 1 tablet by mouth 2 (two) times a day.    loratadine (CLARITIN REDITABS) 10 MG dissolvable tablet Take 10 mg by mouth at bedtime.   montelukast (SINGULAIR) 10 MG tablet Take 1 tablet (10 mg total) by mouth at bedtime.   Multiple Vitamins-Minerals (OCUVITE ADULT 50+) CAPS    omeprazole (PRILOSEC) 40 MG capsule TAKE 1 CAPSULE BY MOUTH EVERY DAY    Past Medical History:  Diagnosis Date   Cancer ([place]) 1991   left breast. treated with rads and lumpectomy   Cancer ([place]) 2020   newly diagnosed, also left breast   Cellulitis and abscess of toe of left foot 01/2018   GERD (gastroesophageal reflux disease)    Hx of therapeutic radiation 1991   left breast   Laryngopharyngeal reflux    sometimes food goes down wrong way and she ends up in  extreme coughing/choking scenario   Personal history of radiation therapy 1991   left breast ca   Primary osteoarthritis    Thoracic aortic aneurysm (TAA) ([place])     Past Surgical History:  Procedure Laterality Date   BREAST LUMPECTOMY Left 1991   breast ca, lumpectomy   cataracts Bilateral    CYSTOCELE REPAIR  2010   EYE SURGERY Bilateral 2017   cataracts extracted   JOINT REPLACEMENT     MASTECTOMY W/ SENTINEL NODE BIOPSY Left 07/25/2018   Procedure: MASTECTOMY WITH SENTINEL LYMPH NODE BIOPSY;  Surgeon: Olean Ree, MD;  Location: [facility] ORS;  Service: General;  Laterality: Left;   OOPHORECTOMY  1995   ovarian mass - benign   REPLACEMENT TOTAL KNEE Bilateral 2012,2013   SKIN BIOPSY Left    arm growth   TONSILLECTOMY  1958   TOTAL SHOULDER ARTHROPLASTY Right 2016    Social History Social History   Tobacco Use   Smoking status: Never Smoker   Smokeless tobacco: Never Used   Tobacco comment: smoking cessation materials not required  Substance Use Topics   Alcohol use: Yes    Comment: occasional   Drug use: Never    Family History Family History  Problem Relation Age of Onset   Hypertension Mother    Alzheimer's disease Mother    Stroke Father    Cancer Brother        prostate  Atrial fibrillation Brother    Cancer Other        breast   Atrial fibrillation Brother    Breast cancer Cousin 72       paternal x 2  No family history of bleeding/clotting disorders, porphyria or autoimmune disease   Allergies  Allergen Reactions   Ciprofloxacin Nausea And Vomiting     REVIEW OF SYSTEMS (Negative unless checked)  Constitutional: [] Weight loss  [] Fever  [] Chills Cardiac: [] Chest pain   [] Chest pressure   [] Palpitations   [] Shortness of breath when laying flat   [] Shortness of breath with exertion. Vascular:  [] Pain in legs with walking   [] Pain in legs at rest  [] History of DVT   [] Phlebitis   [] Swelling in legs   [] Varicose veins    [] Non-healing ulcers Pulmonary:   [] Uses home oxygen   [] Productive cough   [] Hemoptysis   [] Wheeze  [] COPD   [] Asthma Neurologic:  [] Dizziness   [] Seizures   [] History of stroke   [] History of TIA  [] Aphasia   [] Vissual changes   [] Weakness or numbness in arm   [] Weakness or numbness in leg Musculoskeletal:   [] Joint swelling   [] Joint pain   [] Low back pain Hematologic:  [] Easy bruising  [] Easy bleeding   [] Hypercoagulable state   [] Anemic Gastrointestinal:  [] Diarrhea   [] Vomiting  [x] Gastroesophageal reflux/heartburn   [] Difficulty swallowing. Genitourinary:  [] Chronic kidney disease   [] Difficult urination  [] Frequent urination   [] Blood in urine Skin:  [] Rashes   [] Ulcers  Psychological:  [] History of anxiety   []  History of major depression.  Physical Examination  Vitals:   12/29/18 0901  BP: 137/83  Pulse: 73  Resp: 17  Weight: 176 lb (79.8 kg)  Height: 5\' 3"  (1.6 m)   Body mass index is 31.18 kg/m. Gen: WD/WN, NAD Head: [place]/AT, No temporalis wasting.  Ear/Nose/Throat: Hearing grossly intact, nares w/o erythema or drainage, poor dentition Eyes: PER, EOMI, sclera nonicteric.  Neck: Supple, no masses.  No bruit or JVD.  Pulmonary:  Good air movement, clear to auscultation bilaterally, no use of accessory muscles.  Cardiac: RRR, normal S1, S2, no Murmurs. Vascular: Broadened abdominal impulse, bilateral popliteal arteries demonstrate broadened impulses as well Vessel Right Left  Radial Palpable Palpable  Carotid Palpable Palpable  Popliteal Palpable Palpable  PT Palpable Palpable  DP Palpable Palpable   Gastrointestinal: soft, non-distended. No guarding/no peritoneal signs.  Musculoskeletal: M/S 5/5 throughout.  No deformity or atrophy.  Neurologic: CN 2-12 intact. Pain and light touch intact in extremities.  Symmetrical.  Speech is fluent. Motor exam as listed above. Psychiatric: Judgment intact, Mood & affect appropriate for pt's clinical situation. Dermatologic: No  rashes or ulcers noted.  No changes consistent with cellulitis. Lymph : No Cervical lymphadenopathy, no lichenification or skin changes of chronic lymphedema.  CBC Lab Results  Component Value Date   WBC 6.4 12/15/2018   HGB 14.9 12/15/2018   HCT 43.5 12/15/2018   MCV 90.8 12/15/2018   PLT 230 12/15/2018    BMET    Component Value Date/Time   NA 137 12/15/2018 1017   NA 144 05/27/2018 1051   K 4.1 12/15/2018 1017   CL 103 12/15/2018 1017   CO2 25 12/15/2018 1017   GLUCOSE 88 12/15/2018 1017   BUN 20 12/15/2018 1017   BUN 13 05/27/2018 1051   CREATININE 0.58 12/15/2018 1017   CALCIUM 9.1 12/15/2018 1017   GFRNONAA >60 12/15/2018 1017   GFRAA >60 12/15/2018 1017   Estimated  Creatinine Clearance: 53.3 mL/min (by C-G formula based on SCr of 0.58 mg/dL).  COAG No results found for: INR, PROTIME  Radiology Ct Angio Chest W/cm &/or Wo Cm  Result Date: 12/05/2018 CLINICAL DATA:  Thoracic aortic aneurysm follow-up. EXAM: CT ANGIOGRAPHY CHEST WITH CONTRAST TECHNIQUE: Multidetector CT imaging of the chest was performed using the standard protocol during bolus administration of intravenous contrast. Multiplanar CT image reconstructions and MIPs were obtained to evaluate the vascular anatomy. CONTRAST:  31mL ISOVUE-370 IOPAMIDOL (ISOVUE-370) INJECTION 76% COMPARISON:  CT chest dated May 10, 2018. FINDINGS: Cardiovascular: Unchanged ascending thoracic aortic aneurysm measuring 4.5 cm in maximal dimension, previously 4.6 cm. No dissection. The great vessels are unremarkable. The coronary and thoracic aortic atherosclerotic calcification. Normal heart size. No pericardial effusion. Normal caliber pulmonary arteries. No central pulmonary embolism. Mediastinum/Nodes: New borderline enlarged right axillary lymph node, now measuring 10 mm in short axis, previously 8 mm. No enlarged mediastinal or hilar lymph nodes. The thyroid gland, trachea, and esophagus demonstrate no significant findings.  Lungs/Pleura: No focal consolidation, pleural effusion, or pneumothorax. Unchanged 3 mm nodule in the right middle lobe (series 5, image 78). Unchanged calcified granuloma in the left upper lobe. Scattered scarring is unchanged. Upper Abdomen: No acute abnormality. 8 mm cyst in the inferior right hepatic lobe. Right parapelvic renal cyst. Punctate nonobstructive calculus in the upper pole of the left kidney. Musculoskeletal: Interval left mastectomy with 1.6 x 8.6 x 2.7 cm simple fluid collection in the surgical bed, likely a seroma. Round 1.2 x 1.9 cm fluid density lesion in the inferior left axilla likely represents a seroma at the site of interval sentinel lymph node dissection. No acute or significant osseous findings. Unchanged severe left glenohumeral osteoarthritis. Prior right reverse total shoulder arthroplasty. Review of the MIP images confirms the above findings. IMPRESSION: 1. Unchanged ascending thoracic aortic aneurysm measuring up to 4.5 cm. Recommend semi-annual imaging followup by CTA or MRA and referral to cardiothoracic surgery if not already obtained. This recommendation follows 2010 ACCF/AHA/AATS/ACR/ASA/SCA/SCAI/SIR/STS/SVM Guidelines for the Diagnosis and Management of Patients With Thoracic Aortic Disease. Circulation. 2010; 121: I097-D532. Aortic aneurysm NOS (ICD10-I71.9) 2. Interval left mastectomy with 1.6 x 8.6 x 2.7 cm seroma in the surgical bed. 3. Round 1.2 x 1.9 cm fluid density lesion in the inferior left axilla also likely represents a seroma at the site of interval sentinel lymph node dissection. 4. New borderline enlarged right axillary lymph node measuring 10 mm in short axis, nonspecific. Attention on follow-up imaging recommended. 5.  Aortic atherosclerosis (ICD10-I70.0). Electronically Signed   By: Titus Dubin M.D.   On: 12/05/2018 13:59     Assessment/Plan 1. Thoracic ascending aortic aneurysm ([place]) No surgery or intervention at this time. The patient has an  asymptomatic ascending thoracic aortic aneurysm that is less than 5.5 cm in maximal diameter.  I have discussed the natural history of abdominal aortic aneurysm and the small risk of rupture for aneurysm less than 6 cm in size.  However, as these small aneurysms tend to enlarge over time, continued surveillance with CT scan is mandatory.  I have also discussed optimizing medical management with hypertension and lipid control and the importance of abstinence from tobacco.  The patient is also encouraged to exercise a minimum of 30 minutes 4 times a week.  Should the patient develop new onset abdominal or back pain or signs of peripheral embolization they are instructed to seek medical attention immediately and to alert the physician providing care that they have an aneurysm.  The patient voices their understanding. The patient will return in 12 months with a CT angiogram of the chest   2. Pulsatile abdomen Given the history of aneurysmal disease duplex ultrasound of the aorta will be obtained to assess the size of the infrarenal abdominal aorta.  3. 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HCC) Given the history of aneurysmal disease duplex ultrasound of the popliteal arteries as well as the common femoral arteries will be obtained to assess the size of these arterial structures and ensure they are not aneurysmal.  4. Primary osteoarthritis involving multiple joints Continue NSAID medications as already ordered, these medications have been reviewed and there are no changes at this time.  Continued activity and therapy was stressed.   Hortencia Pilar, MD  12/29/2018 9:51 AM

## 2018-12-30 DIAGNOSIS — I724 Aneurysm of artery of lower extremity: Secondary | ICD-10-CM | POA: Insufficient documentation

## 2018-12-30 DIAGNOSIS — R198 Other specified symptoms and signs involving the digestive system and abdomen: Secondary | ICD-10-CM | POA: Insufficient documentation

## 2019-01-01 DIAGNOSIS — R590 Localized enlarged lymph nodes: Secondary | ICD-10-CM | POA: Insufficient documentation

## 2019-01-10 NOTE — Progress Notes (Signed)
[facility]  [address] [place], [place] [address] Phone: [phone]  Fax: [phone]   Telephone Visit:  01/12/2019  Referring physician: Glean Hess, MD  I connected with Connie Mitchell on 01/12/2019 at 3:33 PM by telephone conferencing and verified that I was speaking with the correct person using 2 identifiers.  The patient was at home.  I discussed the limitations, risk, security and privacy concerns of performing an evaluation and management service by telephone conferencing and the availability of in person appointments.  I also discussed with the patient that there may be a patient responsible charge related to this service.  The patient expressed understanding and agreed to proceed.   Chief Complaint: Connie Mitchell is a 85 y.o. female  with stage IAleft breast cancer who is seen for 4 week assessment.  HPI: The patient was last seen in the medical oncology clinic on 12/15/2018. At that time, she was doing well. Exam revealed a palpable left sided seroma. Labs were normal.   Right diagnostic mammogram and ultrasound on 12/29/2018 revealed no suspicious mammographic findings.  There was a single borderline right axillary lymph node demonstrating focal cortical thickening up to 4 mm. This lymph node was not  amenable to biopsy due to deep positioning and patient difficulty with raising her right arm due to recent shoulder surgery. Recommendation was for short-term imaging follow-up.  The patient was seen for initial consult related to her thoracic aneurysm with vascular surgeon, Dr. Delana Meyer, on 12/29/2018. The patient had an asymptomatic ascending thoracic aortic aneurysm < 5.5 cm in maximal diameter.  Continued surveillance was recommended.    During the interim, the patient is doing good. She notes that she read the side effects of Prolia and was not interested. Her energy level is great. She notes  chronic cough and back pain. She like to go  swimming 2 or 3 times weekly to remain active.    Past Medical History:  Diagnosis Date  . Cancer ([place]) 1991   left breast. treated with rads and lumpectomy  . Cancer ([place]) 2020   newly diagnosed, also left breast  . Cellulitis and abscess of toe of left foot 01/2018  . GERD (gastroesophageal reflux disease)   . Hx of therapeutic radiation 1991   left breast  . Laryngopharyngeal reflux    sometimes food goes down wrong way and she ends up in extreme coughing/choking scenario  . Personal history of radiation therapy 1991   left breast ca  . Primary osteoarthritis   . Thoracic aortic aneurysm (TAA) ([place])     Past Surgical History:  Procedure Laterality Date  . BREAST LUMPECTOMY Left 1991   breast ca, lumpectomy  . cataracts Bilateral   . CYSTOCELE REPAIR  2010  . EYE SURGERY Bilateral 2017   cataracts extracted  . JOINT REPLACEMENT    . MASTECTOMY Left 07/25/2018   Invasive Mammary Carcinoma  . MASTECTOMY W/ SENTINEL NODE BIOPSY Left 07/25/2018   Procedure: MASTECTOMY WITH SENTINEL LYMPH NODE BIOPSY;  Surgeon: Olean Ree, MD;  Location: [facility] ORS;  Service: General;  Laterality: Left;  . OOPHORECTOMY  1995   ovarian mass - benign  . REPLACEMENT TOTAL KNEE Bilateral 2012,2013  . SKIN BIOPSY Left    arm growth  . TONSILLECTOMY  1958  . TOTAL SHOULDER ARTHROPLASTY Right 2016    Family History  Problem Relation Age of Onset  . Hypertension Mother   . Alzheimer's disease Mother   . Stroke Father   .  Cancer Brother        prostate  . Atrial fibrillation Brother   . Cancer Other        breast  . Atrial fibrillation Brother   . Breast cancer Cousin 72       paternal x 2    Social History:  reports that she has never smoked. She has never used smokeless tobacco. She reports current alcohol use. She reports that she does not use drugs. MA-FL-MA-[place]; here in [place] since 03/2017. She lives in [place]. She consumes minimal amounts of ETOH on an infrequent basis. She has never  smoked. Patient denies known exposures to radiation on toxins. 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 Patient has 1 cat and 2 dogs. The patient is alone today.  Participants in the patient's visit and their role in the encounter included the patient, Vito Berger, CMA, today.  The intake visit was provided by Vito Berger, CMA.  Allergies:  Allergies  Allergen Reactions  . Ciprofloxacin Nausea And Vomiting    Current Medications: Current Outpatient Medications  Medication Sig Dispense Refill  . Calcium Carbonate-Vitamin D (CALCIUM-VITAMIN D) 600-125 MG-UNIT TABS Take 1 tablet by mouth 2 (two) times a day.     . loratadine (CLARITIN REDITABS) 10 MG dissolvable tablet Take 10 mg by mouth at bedtime.    . montelukast (SINGULAIR) 10 MG tablet Take 1 tablet (10 mg total) by mouth at bedtime. 90 tablet 1  . Multiple Vitamins-Minerals ([place] 50+) CAPS     . omeprazole (PRILOSEC) 40 MG capsule TAKE 1 CAPSULE BY MOUTH EVERY DAY 30 capsule 5   No current facility-administered medications for this visit.      Review of Systems  Constitutional: Negative for chills, diaphoresis, fever, malaise/fatigue and weight loss.       Doing well. Energy level is "great".  HENT: Negative.  Negative for congestion, ear pain, hearing loss, nosebleeds, sinus pain and sore throat.   Eyes: Negative.  Negative for blurred vision, double vision, photophobia and pain.  Respiratory: Positive for cough (chronic). Negative for hemoptysis, sputum production and wheezing.   Cardiovascular: Negative.  Negative for chest pain, palpitations, orthopnea and PND.       Echo on 06/20/2018 revealed an EF of 60-65%.  Gastrointestinal: Negative.  Negative for abdominal pain, blood in stool, constipation, diarrhea, heartburn, melena, nausea and vomiting.  Genitourinary: Negative.  Negative for dysuria, frequency and hematuria.  Musculoskeletal: Positive for back pain (chronic). Negative for falls and  myalgias.       S/p bilateral knee and right shoulder. Scoliosis.  Skin: Negative.  Negative for itching and rash.  Neurological: Negative.  Negative for dizziness, tingling, sensory change, speech change, focal weakness, weakness and headaches.  Endo/Heme/Allergies: Negative.   Psychiatric/Behavioral: Negative.  Negative for depression and memory loss. The patient is not nervous/anxious and does not have insomnia.   All other systems reviewed and are negative.   Performance status (ECOG): 0   No visits with results within 3 Day(s) from this visit.  Latest known visit with results is:  Appointment on 12/15/2018  Component Date Value Ref Range Status  . WBC 12/15/2018 6.4  4.0 - 10.5 K/uL Final  . RBC 12/15/2018 4.79  3.87 - 5.11 MIL/uL Final  . Hemoglobin 12/15/2018 14.9  12.0 - 15.0 g/dL Final  . HCT 12/15/2018 43.5  36.0 - 46.0 % Final  . MCV 12/15/2018 90.8  80.0 - 100.0 fL Final  . MCH 12/15/2018 31.1  26.0 -  34.0 pg Final  . MCHC 12/15/2018 34.3  30.0 - 36.0 g/dL Final  . RDW 12/15/2018 15.0  11.5 - 15.5 % Final  . Platelets 12/15/2018 230  150 - 400 K/uL Final   PLATELET COUNT PERFORMED ON CITRATED BLOOD  . nRBC 12/15/2018 0.0  0.0 - 0.2 % Final  . Neutrophils Relative % 12/15/2018 61  % Final  . Neutro Abs 12/15/2018 4.0  1.7 - 7.7 K/uL Final  . Lymphocytes Relative 12/15/2018 27  % Final  . Lymphs Abs 12/15/2018 1.7  0.7 - 4.0 K/uL Final  . Monocytes Relative 12/15/2018 7  % Final  . Monocytes Absolute 12/15/2018 0.4  0.1 - 1.0 K/uL Final  . Eosinophils Relative 12/15/2018 4  % Final  . Eosinophils Absolute 12/15/2018 0.2  0.0 - 0.5 K/uL Final  . Basophils Relative 12/15/2018 1  % Final  . Basophils Absolute 12/15/2018 0.0  0.0 - 0.1 K/uL Final  . Immature Granulocytes 12/15/2018 0  % Final  . Abs Immature Granulocytes 12/15/2018 0.01  0.00 - 0.07 K/uL Final   Performed at [facility], [address], [place], [place] [address]  . Sodium 12/15/2018 137   135 - 145 mmol/L Final  . Potassium 12/15/2018 4.1  3.5 - 5.1 mmol/L Final  . Chloride 12/15/2018 103  98 - 111 mmol/L Final  . CO2 12/15/2018 25  22 - 32 mmol/L Final  . Glucose, Bld 12/15/2018 88  70 - 99 mg/dL Final  . BUN 12/15/2018 20  8 - 23 mg/dL Final  . Creatinine, Ser 12/15/2018 0.58  0.44 - 1.00 mg/dL Final  . Calcium 12/15/2018 9.1  8.9 - 10.3 mg/dL Final  . Total Protein 12/15/2018 7.0  6.5 - 8.1 g/dL Final  . Albumin 12/15/2018 4.0  3.5 - 5.0 g/dL Final  . AST 12/15/2018 23  15 - 41 U/L Final  . ALT 12/15/2018 26  0 - 44 U/L Final  . Alkaline Phosphatase 12/15/2018 68  38 - 126 U/L Final  . Total Bilirubin 12/15/2018 0.6  0.3 - 1.2 mg/dL Final  . GFR calc non Af Amer 12/15/2018 >60  >60 mL/min Final  . GFR calc Af Amer 12/15/2018 >60  >60 mL/min Final  . Anion gap 12/15/2018 9  5 - 15 Final   Performed at [facility], [address] [place], [place] [address]    Assessment:  Connie Mitchell is a 85 y.o. female withstage IAleft breast cancers/p mastectomy and sentinel lymph node biopsy on 07/25/2018. Pathologyrevealed a 10 x 8 x 7 mm grade 1 invasive mammary carcinoma. Margins were negative. Four sentinel lymph nodes were negative. Tumor is ER + (> 90%), PR+ (> 90%), and Her2/neu 1+.Pathologic stage was T1bN0(sn). CA27.29was 30.3 on 07/07/2018.  Chest CTon 05/10/2018 revealed a 1.3 cm left breast mass.   Bilateral mammogram and ultrasoundon 06/29/2018 revealed lumpectomy changes in the lateral left breast. There was 1.8 x 1.7 x 1.0 cm lobulated mass in the upper outer quadrant. There was no enlarged adenopathy.  She has a history ofleft breast cancers/p lumpectomy in 1991. She received radiation. She declined chemotherapy and endocrine therapy.  Chest CT angiogram on 12/05/2018 revealed unchanged ascending thoracic aortic aneurysm measuring up to 4.5 cm.  Interval left mastectomy with 1.6 x 8.6 x 2.7 cm seroma.  Round 1.2 x 1.9 cm fluid  density lesion in the inferior left axilla also likely represents a seroma at the site of interval sentinel lymph node dissection. New borderline  enlarged right axillary lymph node measuring 10 mm in short axis, nonspecific.  Right diagnostic mammogram and ultrasound on 12/29/2018 revealed no suspicious mammographic findings.  There was a single borderline right axillary lymph node demonstrating focal cortical thickening up to 4 mm. This lymph node was not  amenable to biopsy due to deep positioning and patient difficulty with raising her right arm due to recent shoulder surgery.  Bone densityon 11/29/2017 revealed osteopeniawith a T-score of -2.1 in the right femur.  Symptomatically, she is doing well.  Plan: 1.   Clinical stage IA left breast cancer Clinically she is doing well.               Interval mammogram and ultrasound reviewed.   There is a single borderline axillary lymph node too small and too deep to biopsy.  Discuss plan for follow-up right axillary ultrasound in 3 months             Patient has declined endocrine therapy. 2. Osteopenia Continue calcium and vitamin D. Patient is not interested in Prolia secondary to potential side effects 3.   Right axillary ultrasound on 03/31/2019. 4.   RTC after ultrasound for MD assessment and labs (CBC with diff, CMP, CA27.29).  I discussed the assessment and treatment plan with the patient.  The patient was provided an opportunity to ask questions and all were answered.  The patient agreed with the plan and demonstrated an understanding of the instructions.  The patient was advised to call back or seek an in person evaluation if the symptoms worsen or if the condition fails to improve as anticipated.  I provided 9 minutes (3:33 PM - 3:42 PM) of non face-to-face video visit time during this this encounter and > 50% was spent counseling as documented under my assessment and plan.  I provided these  services from the [facility] office.   Nolon Stalls, MD, PhD  01/12/2019, 3:33 PM  I, Selena Batten, am acting as scribe for [employer]. Mike Gip, MD, PhD.  I,  C. Mike Gip, MD, have reviewed the above documentation for accuracy and completeness, and I agree with the above.

## 2019-01-11 ENCOUNTER — Other Ambulatory Visit: Payer: Self-pay

## 2019-01-12 ENCOUNTER — Inpatient Hospital Stay (HOSPITAL_BASED_OUTPATIENT_CLINIC_OR_DEPARTMENT_OTHER): Payer: Medicare Other | Admitting: Hematology and Oncology

## 2019-01-12 DIAGNOSIS — Z170 Estrogen receptor positive status [ER+]: Secondary | ICD-10-CM

## 2019-01-12 DIAGNOSIS — C50412 Malignant neoplasm of upper-outer quadrant of left female breast: Secondary | ICD-10-CM

## 2019-01-12 DIAGNOSIS — M85851 Other specified disorders of bone density and structure, right thigh: Secondary | ICD-10-CM | POA: Diagnosis not present

## 2019-01-12 NOTE — Progress Notes (Signed)
No new changes noted today. The patient Name and DOB has been verified by phone today. 

## 2019-01-15 ENCOUNTER — Encounter: Payer: Self-pay | Admitting: Hematology and Oncology

## 2019-01-18 ENCOUNTER — Other Ambulatory Visit: Payer: Self-pay

## 2019-01-18 ENCOUNTER — Ambulatory Visit (INDEPENDENT_AMBULATORY_CARE_PROVIDER_SITE_OTHER): Payer: Medicare Other | Admitting: Nurse Practitioner

## 2019-01-18 ENCOUNTER — Ambulatory Visit (INDEPENDENT_AMBULATORY_CARE_PROVIDER_SITE_OTHER): Payer: Medicare Other

## 2019-01-18 ENCOUNTER — Encounter (INDEPENDENT_AMBULATORY_CARE_PROVIDER_SITE_OTHER): Payer: Self-pay | Admitting: Nurse Practitioner

## 2019-01-18 VITALS — BP 125/78 | HR 66 | Resp 17 | Ht 63.0 in | Wt 174.0 lb

## 2019-01-18 DIAGNOSIS — I724 Aneurysm of artery of lower extremity: Secondary | ICD-10-CM

## 2019-01-18 DIAGNOSIS — I712 Thoracic aortic aneurysm, without rupture, unspecified: Secondary | ICD-10-CM

## 2019-01-18 DIAGNOSIS — M159 Polyosteoarthritis, unspecified: Secondary | ICD-10-CM

## 2019-01-18 DIAGNOSIS — M150 Primary generalized (osteo)arthritis: Secondary | ICD-10-CM

## 2019-01-18 DIAGNOSIS — R198 Other specified symptoms and signs involving the digestive system and abdomen: Secondary | ICD-10-CM

## 2019-01-18 NOTE — Progress Notes (Signed)
SUBJECTIVE:  Patient ID: Connie Mitchell, female    DOB: 08-07-1933, 85 y.o.   MRN: [id number] Chief Complaint  Patient presents with  . Follow-up    ultrasound    HPI  Connie Mitchell is a 85 y.o. female that presents today after being seen for a known thoracic abdominal aortic aneurysm.  The patient also had evidence of a pulsatile abdomen, and there is also concern for possible popliteal aneurysms.  The patient denies any peripheral artery disease on symptoms such as claudication, rest pain or discoloration of the lower extremities.  The patient had noninvasive studies done today which showed no evidence of an abdominal aortic aneurysm visualized.  The abdominal aorta show some significant tortuosity in the mid to distal region but no areas of dilatation.  There is mild atherosclerosis throughout.  The common iliac arteries are of normal size.  The largest measurement of the abdominal aorta is 2.1 cm.  The common femoral artery as well as popliteal arteries bilaterally have no evidence of aneurysm.  Triphasic flows were also seen in the bilateral common femoral and popliteal arteries. Past Medical History:  Diagnosis Date  . Cancer ([place]) 1991   left breast. treated with rads and lumpectomy  . Cancer ([place]) 2020   newly diagnosed, also left breast  . Cellulitis and abscess of toe of left foot 01/2018  . GERD (gastroesophageal reflux disease)   . Hx of therapeutic radiation 1991   left breast  . Laryngopharyngeal reflux    sometimes food goes down wrong way and she ends up in extreme coughing/choking scenario  . Personal history of radiation therapy 1991   left breast ca  . Primary osteoarthritis   . Thoracic aortic aneurysm (TAA) ([place])     Past Surgical History:  Procedure Laterality Date  . BREAST LUMPECTOMY Left 1991   breast ca, lumpectomy  . cataracts Bilateral   . CYSTOCELE REPAIR  2010  . EYE SURGERY Bilateral 2017   cataracts extracted  . JOINT REPLACEMENT    . MASTECTOMY  Left 07/25/2018   Invasive Mammary Carcinoma  . MASTECTOMY W/ SENTINEL NODE BIOPSY Left 07/25/2018   Procedure: MASTECTOMY WITH SENTINEL LYMPH NODE BIOPSY;  Surgeon: Olean Ree, MD;  Location: [facility] ORS;  Service: General;  Laterality: Left;  . OOPHORECTOMY  1995   ovarian mass - benign  . REPLACEMENT TOTAL KNEE Bilateral 2012,2013  . SKIN BIOPSY Left    arm growth  . TONSILLECTOMY  1958  . TOTAL SHOULDER ARTHROPLASTY Right 2016    Social History   Socioeconomic History  . Marital status: Single    Spouse name: Not on file  . Number of children: 0  . Years of education: Not on file  . Highest education level: Master's degree (e.g., MA, MS, MEng, MEd, MSW, MBA)  Occupational History  . Occupation: [employer]!    Comment: retired  Scientific laboratory technician  . Financial resource strain: Not hard at all  . Food insecurity    Worry: Never true    Inability: Never true  . Transportation needs    Medical: No    Non-medical: No  Tobacco Use  . Smoking status: Never Smoker  . Smokeless tobacco: Never Used  . Tobacco comment: smoking cessation materials not required  Substance and Sexual Activity  . Alcohol use: Yes    Comment: occasional  . Drug use: Never  . Sexual activity: Not Currently  Lifestyle  . Physical activity    Days per week:  4 days    Minutes per session: 60 min  . Stress: Not at all  Relationships  . Social connections    Talks on phone: More than three times a week    Gets together: Three times a week    Attends religious service: Never    Active member of club or organization: No    Attends meetings of clubs or organizations: Never    Relationship status: Never married  . Intimate partner violence    Fear of current or ex partner: No    Emotionally abused: No    Physically abused: No    Forced sexual activity: No  Other Topics Concern  . Not on file  Social History Narrative  . Not on file    Family History  Problem Relation Age of Onset   . Hypertension Mother   . Alzheimer's disease Mother   . Stroke Father   . Cancer Brother        prostate  . Atrial fibrillation Brother   . Cancer Other        breast  . Atrial fibrillation Brother   . Breast cancer Cousin 72       paternal x 2    Allergies  Allergen Reactions  . Ciprofloxacin Nausea And Vomiting     Review of Systems   Review of Systems: Negative Unless Checked Constitutional: [] Weight loss  [] Fever  [] Chills Cardiac: [] Chest pain   []  Atrial Fibrillation  [] Palpitations   [] Shortness of breath when laying flat   [] Shortness of breath with exertion. [] Shortness of breath at rest Vascular:  [] Pain in legs with walking   [] Pain in legs with standing [] Pain in legs when laying flat   [] Claudication    [] Pain in feet when laying flat    [] History of DVT   [] Phlebitis   [] Swelling in legs   [] Varicose veins   [] Non-healing ulcers Pulmonary:   [] Uses home oxygen   [] Productive cough   [] Hemoptysis   [] Wheeze  [] COPD   [] Asthma Neurologic:  [] Dizziness   [] Seizures  [] Blackouts [] History of stroke   [] History of TIA  [] Aphasia   [] Temporary Blindness   [] Weakness or numbness in arm   [x] Weakness or numbness in leg Musculoskeletal:   [] Joint swelling   [] Joint pain   [] Low back pain  []  History of Knee Replacement [x] Arthritis [] back Surgeries  []  Spinal Stenosis    Hematologic:  [] Easy bruising  [] Easy bleeding   [] Hypercoagulable state   [] Anemic Gastrointestinal:  [] Diarrhea   [] Vomiting  [] Gastroesophageal reflux/heartburn   [] Difficulty swallowing. [] Abdominal pain Genitourinary:  [] Chronic kidney disease   [] Difficult urination  [] Anuric   [] Blood in urine [] Frequent urination  [] Burning with urination   [] Hematuria Skin:  [] Rashes   [] Ulcers [] Wounds Psychological:  [] History of anxiety   []  History of major depression  []  Memory Difficulties      OBJECTIVE:   Physical Exam  BP 125/78 (BP Location: Right Arm)   Pulse 66   Resp 17   Ht 5\' 3"  (1.6 m)   Wt  174 lb (78.9 kg)   BMI 30.82 kg/m   Gen: WD/WN, NAD Head: [place]/AT, No temporalis wasting.  Ear/Nose/Throat: Hearing grossly intact, nares w/o erythema or drainage Eyes: PER, EOMI, sclera nonicteric.  Neck: Supple, no masses.  No JVD.  Pulmonary:  Good air movement, no use of accessory muscles.  Cardiac: RRR Vascular:  Vessel Right Left  Radial Palpable Palpable  Dorsalis Pedis Palpable Palpable  Posterior Tibial  Palpable Palpable   Gastrointestinal: soft, non-distended. No guarding/no peritoneal signs.  Musculoskeletal: Uses walker for ambulation.  No deformity or atrophy.  Neurologic: Pain and light touch intact in extremities.  Symmetrical.  Speech is fluent. Motor exam as listed above. Psychiatric: Judgment intact, Mood & affect appropriate for pt's clinical situation. Dermatologic: No Venous rashes. No Ulcers Noted.  No changes consistent with cellulitis. Lymph : No Cervical lymphadenopathy, no lichenification or skin changes of chronic lymphedema.       ASSESSMENT AND PLAN:  1. Thoracic aortic aneurysm without rupture [facility]) As previously discussed with the patient the patient has an asymptomatic ascending thoracic aneurysm.  This is less than 5.5 in maximal diameter.  There is a small risk of rupture for aneurysms less than 6 cm in size.  However due to the fact that they continue to a larger time CT scan can surveillance is necessary.  Patient is advised to optimize medical management with hypertension and lipid control.  As discussed previously patient will continue to follow this until she reaches approximately 5.5 cm at which point we will refer her to a cardiothoracic surgeon.  Patient will follow-up in 12 months with CT angiogram of the chest. - CT ANGIO CHEST AORTA W/CM &/OR WO/CM; Future  2. Primary osteoarthritis involving multiple joints Continue NSAID medications as already ordered, these medications have been reviewed and there are no changes at this time.  Continued  activity and therapy was stressed.   3. Pulsatile abdomen This is possibly due to some tortuosity of the aorta however there is no evidence of an abdominal aortic aneurysm.  We will follow-up for this on an as-needed basis.   Current Outpatient Medications on File Prior to Visit  Medication Sig Dispense Refill  . Calcium Carbonate-Vitamin D (CALCIUM-VITAMIN D) 600-125 MG-UNIT TABS Take 1 tablet by mouth 2 (two) times a day.     . loratadine (CLARITIN REDITABS) 10 MG dissolvable tablet Take 10 mg by mouth at bedtime.    . montelukast (SINGULAIR) 10 MG tablet Take 1 tablet (10 mg total) by mouth at bedtime. 90 tablet 1  . Multiple Vitamins-Minerals ([place] 50+) CAPS     . omeprazole (PRILOSEC) 40 MG capsule TAKE 1 CAPSULE BY MOUTH EVERY DAY 30 capsule 5   No current facility-administered medications on file prior to visit.     There are no Patient Instructions on file for this visit. Return in about 1 year (around 01/18/2020) for TAA.   Kris Hartmann, NP  This note was completed with Sales executive.  Any errors are purely unintentional.

## 2019-01-23 ENCOUNTER — Other Ambulatory Visit: Payer: Self-pay

## 2019-01-23 ENCOUNTER — Encounter: Payer: Self-pay | Admitting: Internal Medicine

## 2019-01-23 DIAGNOSIS — Z20822 Contact with and (suspected) exposure to covid-19: Secondary | ICD-10-CM

## 2019-01-23 DIAGNOSIS — O864 Pyrexia of unknown origin following delivery: Secondary | ICD-10-CM

## 2019-01-23 DIAGNOSIS — R509 Fever, unspecified: Secondary | ICD-10-CM

## 2019-01-23 NOTE — Telephone Encounter (Signed)
Connie Mitchell- CMA spoke with pt and I ordered Covid test due to sxs. Patient aware of where to go and going now to be tested for Covid.

## 2019-01-24 LAB — NOVEL CORONAVIRUS, NAA: SARS-CoV-2, NAA: NOT DETECTED

## 2019-02-02 ENCOUNTER — Encounter: Payer: Self-pay | Admitting: Emergency Medicine

## 2019-02-02 ENCOUNTER — Other Ambulatory Visit: Payer: Self-pay

## 2019-02-02 ENCOUNTER — Ambulatory Visit
Admission: EM | Admit: 2019-02-02 | Discharge: 2019-02-02 | Disposition: A | Discharge status: Home / Self Care | Payer: Medicare Other | Attending: Family Medicine | Admitting: Family Medicine

## 2019-02-02 DIAGNOSIS — J020 Streptococcal pharyngitis: Secondary | ICD-10-CM | POA: Diagnosis not present

## 2019-02-02 LAB — RAPID STREP SCREEN (MED CTR MEBANE ONLY): Streptococcus, Group A Screen (Direct): POSITIVE — AB

## 2019-02-02 MED ORDER — AMOXICILLIN 500 MG PO TABS: 500.0000 mg | ORAL_TABLET | Freq: Two times a day (BID) | ORAL | 0 refills | Status: DC

## 2019-02-02 NOTE — ED Triage Notes (Addendum)
Patient c/o sore throat and cough x 2 weeks. Patient has been gargling with salt water and using throat lozenges without relief. Patient was tested for COVID on 08/10 and was negative. She had a fever in the beginning of her symptoms but has not had a fever since 08/11.

## 2019-02-02 NOTE — ED Provider Notes (Signed)
[facility]    CSN: [id number] Arrival date & time: 02/02/19  1353   History   Chief Complaint Chief Complaint  Patient presents with  . Sore Throat  . Cough   HPI  85 year old female presents with sore throat and cough.  Patient reports that she has had an ongoing sore throat of the past 2 weeks.  She has been tested for COVID and has been negative.  She states that she has been improving but worsened again 2 days ago.  Reports severe sore throat, 7/10 in severity.  Associated cough.  She has been using salt water gargles and throat lozenges without relief.  She states that she does have underlying reflux but is medicated with omeprazole.  Has allergies which are treated with medication.  No reported sick contacts.  She states that it is difficult for her to swallow and to speak.  This seems to exacerbate her sore throat.  No relieving factors.  No other associated symptoms.  No other complaints.  PMH, Surgical Hx, Family Hx, Social History reviewed and updated as below.  Past Medical History:  Diagnosis Date  . Cancer ([place]) 1991   left breast. treated with rads and lumpectomy  . Cancer ([place]) 2020   newly diagnosed, also left breast  . Cellulitis and abscess of toe of left foot 01/2018  . GERD (gastroesophageal reflux disease)   . Hx of therapeutic radiation 1991   left breast  . Laryngopharyngeal reflux    sometimes food goes down wrong way and she ends up in extreme coughing/choking scenario  . Personal history of radiation therapy 1991   left breast ca  . Primary osteoarthritis   . Thoracic aortic aneurysm (TAA) [facility])     Patient Active Problem List   Diagnosis Date Noted  . Axillary adenopathy 01/01/2019  . Pulsatile abdomen 12/30/2018  . 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 Breast cancer, left ([place]) 07/25/2018  . Osteopenia of neck of right femur 07/12/2018  . Malignant neoplasm of upper-outer quadrant of left female breast ([place]) 07/07/2018  .  Thoracic ascending aortic aneurysm ([place]) 05/27/2018  . LPRD (laryngopharyngeal reflux disease) 03/17/2018  . Post-nasal drip 03/17/2018  . Allergic cough 11/25/2017  . Arthritis involving multiple sites 11/25/2017  . Chronic foot pain 11/25/2017  . Lumbar degenerative disc disease 09/24/2017  . Hx of Lyme disease 09/24/2017  . Hx of basal cell carcinoma 09/24/2017  . Elevated blood pressure reading 09/24/2017  . Nuclear sclerotic cataract of left eye 08/09/2015  . Osteoporosis 07/04/2014  . Primary localized osteoarthrosis of shoulder region 12/13/2012  . History of breast cancer 12/18/2011  . Osteoarthritis 12/18/2011    Past Surgical History:  Procedure Laterality Date  . BREAST LUMPECTOMY Left 1991   breast ca, lumpectomy  . cataracts Bilateral   . CYSTOCELE REPAIR  2010  . EYE SURGERY Bilateral 2017   cataracts extracted  . JOINT REPLACEMENT    . MASTECTOMY Left 07/25/2018   Invasive Mammary Carcinoma  . MASTECTOMY W/ SENTINEL NODE BIOPSY Left 07/25/2018   Procedure: MASTECTOMY WITH SENTINEL LYMPH NODE BIOPSY;  Surgeon: Olean Ree, MD;  Location: [facility] ORS;  Service: General;  Laterality: Left;  . OOPHORECTOMY  1995   ovarian mass - benign  . REPLACEMENT TOTAL KNEE Bilateral 2012,2013  . SKIN BIOPSY Left    arm growth  . TONSILLECTOMY  1958  . TOTAL SHOULDER ARTHROPLASTY Right 2016    OB History   No obstetric history on file.  Home Medications    Prior to Admission medications   Medication Sig Start Date End Date Taking? Authorizing Provider  Calcium Carbonate-Vitamin D (CALCIUM-VITAMIN D) 600-125 MG-UNIT TABS Take 1 tablet by mouth 2 (two) times a day.    Yes [provider]  loratadine (CLARITIN REDITABS) 10 MG dissolvable tablet Take 10 mg by mouth at bedtime.   Yes [provider]  montelukast (SINGULAIR) 10 MG tablet Take 1 tablet (10 mg total) by mouth at bedtime. 11/28/18  Yes Glean Hess, MD  Multiple Vitamins-Minerals  ([place] 50+) CAPS    Yes [provider]  omeprazole (PRILOSEC) 40 MG capsule TAKE 1 CAPSULE BY MOUTH EVERY DAY 09/14/18  Yes Glean Hess, MD  amoxicillin (AMOXIL) 500 MG tablet Take 1 tablet (500 mg total) by mouth 2 (two) times daily. 02/02/19   Coral Spikes, DO    Family History Family History  Problem Relation Age of Onset  . Hypertension Mother   . Alzheimer's disease Mother   . Stroke Father   . Cancer Brother        prostate  . Atrial fibrillation Brother   . Cancer Other        breast  . Atrial fibrillation Brother   . Breast cancer Cousin 72       paternal x 2    Social History Social History   Tobacco Use  . Smoking status: Never Smoker  . Smokeless tobacco: Never Used  . Tobacco comment: smoking cessation materials not required  Substance Use Topics  . Alcohol use: Yes    Comment: occasional  . Drug use: Never   Review of Systems Review of Systems  Constitutional:       No recent fever.  HENT: Positive for sore throat.   Respiratory: Positive for cough.    Physical Exam Triage Vital Signs ED Triage Vitals  Enc Vitals Group     BP 02/02/19 1416 113/85     Pulse Rate 02/02/19 1416 78     Resp 02/02/19 1416 18     Temp 02/02/19 1416 99.1 F (37.3 C)     Temp Source 02/02/19 1416 Oral     SpO2 02/02/19 1416 98 %     Weight 02/02/19 1414 171 lb (77.6 kg)     Height 02/02/19 1414 5\' 3"  (1.6 m)     Head Circumference --      Peak Flow --      Pain Score 02/02/19 1415 7     Pain Loc --      Pain Edu? --      Excl. in [place]? --    Updated Vital Signs BP 113/85 (BP Location: Right Arm)   Pulse 78   Temp 99.1 F (37.3 C) (Oral)   Resp 18   Ht 5\' 3"  (1.6 m)   Wt 77.6 kg   SpO2 98%   BMI 30.29 kg/m   Visual Acuity Right Eye Distance:   Left Eye Distance:   Bilateral Distance:    Right Eye Near:   Left Eye Near:    Bilateral Near:     Physical Exam Vitals signs and nursing note reviewed.  Constitutional:      General:  She is not in acute distress.    Appearance: Normal appearance.  HENT:     Head: Normocephalic and atraumatic.     Right Ear: Tympanic membrane normal.     Left Ear: Tympanic membrane normal.     Mouth/Throat:  Comments: Oropharynx with moderate erythema.  No exudates noted. Eyes:     General:        Right eye: No discharge.        Left eye: No discharge.     Conjunctiva/sclera: Conjunctivae normal.  Cardiovascular:     Rate and Rhythm: Normal rate and regular rhythm.     Heart sounds: No murmur.  Pulmonary:     Effort: Pulmonary effort is normal.     Breath sounds: Normal breath sounds. No wheezing, rhonchi or rales.  Neurological:     General: No focal deficit present.     Mental Status: She is alert and oriented to person, place, and time.  Psychiatric:        Mood and Affect: Mood normal.        Behavior: Behavior normal.    UC Treatments / Results  Labs (all labs ordered are listed, but only abnormal results are displayed) Labs Reviewed  RAPID STREP SCREEN ([facility] ONLY)    EKG   Radiology No results found.  Procedures Procedures (including critical care time)  Medications Ordered in UC Medications - No data to display  Initial Impression / Assessment and Plan / UC Course  I have reviewed the triage vital signs and the nursing notes.  Pertinent labs & imaging results that were available during my care of the patient were reviewed by me and considered in my medical decision making (see chart for details).    85 year old female presents with sore throat.  Rapid strep positive.  Treating with amoxicillin.  Final Clinical Impressions(s) / UC Diagnoses   Final diagnoses:  Strep pharyngitis   Discharge Instructions   None    ED Prescriptions    Medication Sig Dispense Auth. Provider   amoxicillin (AMOXIL) 500 MG tablet Take 1 tablet (500 mg total) by mouth 2 (two) times daily. 20 tablet Coral Spikes, DO     Controlled Substance  Prescriptions [place] Controlled Substance Registry consulted? Not Applicable   Coral Spikes, DO 02/02/19 1524

## 2019-02-15 ENCOUNTER — Ambulatory Visit: Payer: Medicare Other | Admitting: Surgery

## 2019-02-24 ENCOUNTER — Encounter: Payer: Self-pay | Admitting: Surgery

## 2019-02-24 ENCOUNTER — Ambulatory Visit (INDEPENDENT_AMBULATORY_CARE_PROVIDER_SITE_OTHER): Payer: Medicare Other | Admitting: Surgery

## 2019-02-24 ENCOUNTER — Other Ambulatory Visit: Payer: Self-pay

## 2019-02-24 VITALS — BP 138/78 | HR 82 | Temp 97.5°F | Ht 63.0 in | Wt 169.0 lb

## 2019-02-24 DIAGNOSIS — C50412 Malignant neoplasm of upper-outer quadrant of left female breast: Secondary | ICD-10-CM | POA: Diagnosis not present

## 2019-02-24 DIAGNOSIS — Z170 Estrogen receptor positive status [ER+]: Secondary | ICD-10-CM | POA: Diagnosis not present

## 2019-02-24 NOTE — Patient Instructions (Addendum)
May use a vitamin E oils to scars. Return in six months.

## 2019-02-24 NOTE — Progress Notes (Signed)
02/24/2019  History of Present Illness: Connie Mitchell is a 85 y.o. female s/p left mastectomy and sentinel node biopsy on 07/25/18 for recurrent left breast cancer.  She had a CT scan on 11/2018 for follow up of her thoracic aneurysm and it found a small seroma in the left chest as well as left axilla.  It also found a new right axillary lymph node.  This was followed up with a right axillary ultrasound which saw a single deep lymph node with 4 mm cortical thickening, but no evidence of malignancy.  She saw Dr. Zenia Resides recently and decided on 3 month follow up with ultrasound, which is scheduled for 03/2019.  Otherwise, she reports doing well, "as good as can be" for her age.  She denies any new palpable masses, lumps, skin changes.  She reports that in her incision, at mid point, the skin is drier and "ugly".  Past Medical History: Past Medical History:  Diagnosis Date  . Cancer ([place]) 1991   left breast. treated with rads and lumpectomy  . Cancer ([place]) 2020   newly diagnosed, also left breast  . Cellulitis and abscess of toe of left foot 01/2018  . GERD (gastroesophageal reflux disease)   . Hx of therapeutic radiation 1991   left breast  . Laryngopharyngeal reflux    sometimes food goes down wrong way and she ends up in extreme coughing/choking scenario  . Personal history of radiation therapy 1991   left breast ca  . Primary osteoarthritis   . Thoracic aortic aneurysm (TAA) ([place])      Past Surgical History: Past Surgical History:  Procedure Laterality Date  . BREAST LUMPECTOMY Left 1991   breast ca, lumpectomy  . cataracts Bilateral   . CYSTOCELE REPAIR  2010  . EYE SURGERY Bilateral 2017   cataracts extracted  . JOINT REPLACEMENT    . MASTECTOMY Left 07/25/2018   Invasive Mammary Carcinoma  . MASTECTOMY W/ SENTINEL NODE BIOPSY Left 07/25/2018   Procedure: MASTECTOMY WITH SENTINEL LYMPH NODE BIOPSY;  Surgeon: Olean Ree, MD;  Location: [facility] ORS;  Service: General;   Laterality: Left;  . OOPHORECTOMY  1995   ovarian mass - benign  . REPLACEMENT TOTAL KNEE Bilateral 2012,2013  . SKIN BIOPSY Left    arm growth  . TONSILLECTOMY  1958  . TOTAL SHOULDER ARTHROPLASTY Right 2016    Home Medications: Prior to Admission medications   Medication Sig Start Date End Date Taking? Authorizing Provider  Calcium Carbonate-Vitamin D (CALCIUM-VITAMIN D) 600-125 MG-UNIT TABS Take 1 tablet by mouth 2 (two) times a day.    Yes [provider]  loratadine (CLARITIN REDITABS) 10 MG dissolvable tablet Take 10 mg by mouth at bedtime.   Yes [provider]  montelukast (SINGULAIR) 10 MG tablet Take 1 tablet (10 mg total) by mouth at bedtime. 11/28/18  Yes Glean Hess, MD  Multiple Vitamins-Minerals ([place] 50+) CAPS    Yes [provider]  omeprazole (PRILOSEC) 40 MG capsule TAKE 1 CAPSULE BY MOUTH EVERY DAY 09/14/18  Yes Glean Hess, MD  amoxicillin (AMOXIL) 500 MG tablet Take 1 tablet (500 mg total) by mouth 2 (two) times daily. 02/02/19   Coral Spikes, DO    Allergies: Allergies  Allergen Reactions  . Ciprofloxacin Nausea And Vomiting    Review of Systems: Review of Systems  Constitutional: Negative for chills and fever.  Respiratory: Negative for shortness of breath.   Cardiovascular: Negative for chest pain.  Gastrointestinal: Negative for nausea  and vomiting.  Skin: Negative for rash.    Physical Exam BP 138/78   Pulse 82   Temp (!) 97.5 F (36.4 C) (Skin)   Ht 5\' 3"  (1.6 m)   Wt 169 lb (76.7 kg)   SpO2 97%   BMI 29.94 kg/m  CONSTITUTIONAL: No acute distress HEENT:  Normocephalic, atraumatic, extraocular motion intact. RESPIRATORY:  Lungs are clear, and breath sounds are equal bilaterally. Normal respiratory effort without pathologic use of accessory muscles. CARDIOVASCULAR: Heart is regular without murmurs, gallops, or rubs. BREAST:  Left breast s/p mastectomy.  Incision well healed.  At midpoint of the  incision, the inferior edge has darkening and dryness.  No evidence of infection and no palpable seroma.  No palpable masses or other skin changes.  Left axilla s/p SLNBx, with well healed scar.  There is some excess skin in the left axilla but no true seroma.  No axillary or supraclavicular lymphadenopathy.  On the right, no palpable masses, skin changes, nipple changes.  I am unable to palpate any axillary lymphadenopathy.  There is no supraclavicular lymphadenopathy. NEUROLOGIC:  Motor and sensation is grossly normal.  Cranial nerves are grossly intact. PSYCH:  Alert and oriented to person, place and time. Affect is normal.  Labs/Imaging: Mammogram and U/S 12/29/18: FINDINGS: No suspicious mammographic findings are identified within the right breast. The parenchymal pattern is stable.  Mammographic images were processed with CAD.  Targeted ultrasound is performed, showing multiple morphologically normal lymph nodes within the right axilla. Within the very deep right axilla there is a single lymph node with an area of borderline focal cortical thickening up to 4 mm. No additional suspicious findings are identified.  IMPRESSION: Single borderline right axillary lymph node demonstrating focal cortical thickening up to 4 mm. This lymph node would not be amenable to biopsy due to deep positioning and patient difficulty with raising her right arm due to recent shoulder surgery. Recommendation is for short-term imaging follow-up.  RECOMMENDATION: Right axillary ultrasound in 3 months.  Assessment and Plan: This is a 85 y.o. female s/p left mastectomy and SLNBx for recurrent left breast cancer.  --Reviewed mammogram and u/s with patient.  Agree with close follow up ultrasound in 03/2019.   --From surgical standpoint, recommended she use moisturizer lotion and vitamin E lotion at the incision to help with the darkening and dryness. --Follow up in 6 months with me, or sooner depending on the  repeat ultrasound findings.   Face-to-face time spent with the patient and care providers was 15 minutes, with more than 50% of the time spent counseling, educating, and coordinating care of the patient.     Melvyn Neth, [facility]

## 2019-03-23 ENCOUNTER — Other Ambulatory Visit: Payer: Self-pay | Admitting: Internal Medicine

## 2019-03-31 NOTE — Addendum Note (Signed)
Addended by: [name] D on: 03/31/2019 10:15 AM   Modules accepted: Level of Service, SmartSet

## 2019-04-03 ENCOUNTER — Ambulatory Visit
Admission: RE | Admit: 2019-04-03 | Discharge: 2019-04-03 | Disposition: A | Discharge status: Home / Self Care | Payer: Medicare Other | Source: Ambulatory Visit | Attending: Hematology and Oncology | Admitting: Hematology and Oncology

## 2019-04-03 ENCOUNTER — Other Ambulatory Visit: Payer: Self-pay | Admitting: Hematology and Oncology

## 2019-04-03 DIAGNOSIS — R928 Other abnormal and inconclusive findings on diagnostic imaging of breast: Secondary | ICD-10-CM

## 2019-04-03 DIAGNOSIS — Z170 Estrogen receptor positive status [ER+]: Secondary | ICD-10-CM | POA: Insufficient documentation

## 2019-04-03 DIAGNOSIS — C50412 Malignant neoplasm of upper-outer quadrant of left female breast: Secondary | ICD-10-CM | POA: Insufficient documentation

## 2019-04-03 DIAGNOSIS — R590 Localized enlarged lymph nodes: Secondary | ICD-10-CM | POA: Diagnosis not present

## 2019-04-03 NOTE — Progress Notes (Signed)
Confirmed Name, DOB, and Address. Denies any concerns.  

## 2019-04-04 NOTE — Progress Notes (Signed)
[facility]  [address] [place], [place] [address] Phone: [phone]  Fax: [phone]   Clinic Visit:  04/05/2019  Referring physician: Glean Hess, MD  Chief Complaint: Connie Mitchell is a 85 y.o. female  with stage IAleft breast cancer who is seen for 3 month assessment following recent axillary ultrasound.  HPI: The patient was last seen in the medical oncology clinic via telephone on 01/12/2019. At that time, she was doing well.  She continued to decline endocrine therapy.  We discussed plan for follow-up axillary ultrasound in 3 months.  Right axillary ultrasound on 04/03/2019 revealed no discrete right axillary mass was palpated on physical exam. Sonographically, there was a focally cortically thickened right axillary lymph node with the cortex measuring up to 5-6 mm, previously 4 mm.  Recommendation was ultrasound-guided core needle biopsy of the cortically thickened portion of the indeterminate right axillaly node.  During the interim, she has felt "fine".  She is doing breast exams.  She denies any palpable adenopathy.  Her back, knee and leg bother her.  Pain is always on the right side.  Back pain is better.   Past Medical History:  Diagnosis Date  . Cancer ([place]) 1991   left breast. treated with rads and lumpectomy  . Cancer ([place]) 2020   newly diagnosed, also left breast  . Cellulitis and abscess of toe of left foot 01/2018  . GERD (gastroesophageal reflux disease)   . Hx of therapeutic radiation 1991   left breast  . Laryngopharyngeal reflux    sometimes food goes down wrong way and she ends up in extreme coughing/choking scenario  . Personal history of radiation therapy 1991   left breast ca  . Primary osteoarthritis   . Thoracic aortic aneurysm (TAA) ([place])     Past Surgical History:  Procedure Laterality Date  . BREAST LUMPECTOMY Left 1991   breast ca, lumpectomy  . cataracts Bilateral   . CYSTOCELE REPAIR  2010   . EYE SURGERY Bilateral 2017   cataracts extracted  . JOINT REPLACEMENT    . MASTECTOMY Left 07/25/2018   Invasive Mammary Carcinoma  . MASTECTOMY W/ SENTINEL NODE BIOPSY Left 07/25/2018   Procedure: MASTECTOMY WITH SENTINEL LYMPH NODE BIOPSY;  Surgeon: Olean Ree, MD;  Location: [facility] ORS;  Service: General;  Laterality: Left;  . OOPHORECTOMY  1995   ovarian mass - benign  . REPLACEMENT TOTAL KNEE Bilateral 2012,2013  . SKIN BIOPSY Left    arm growth  . TONSILLECTOMY  1958  . TOTAL SHOULDER ARTHROPLASTY Right 2016    Family History  Problem Relation Age of Onset  . Hypertension Mother   . Alzheimer's disease Mother   . Stroke Father   . Cancer Brother        prostate  . Atrial fibrillation Brother   . Cancer Other        breast  . Atrial fibrillation Brother   . Breast cancer Cousin 72       paternal x 2    Social History:  reports that she has never smoked. She has never used smokeless tobacco. She reports current alcohol use. She reports that she does not use drugs. MA-FL-MA-; here in [place] since 03/2017. She lives in [place]. She consumes minimal amounts of ETOH on an infrequent basis. She has never smoked. Patient denies known exposures to radiation on toxins. 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 Patient has 1 cat and 2 dogs.  She lives in  [place].  The patient is alone today.   Allergies:  Allergies  Allergen Reactions  . Ciprofloxacin Nausea And Vomiting    Current Medications: Current Outpatient Medications  Medication Sig Dispense Refill  . Calcium Carbonate-Vitamin D (CALCIUM-VITAMIN D) 600-125 MG-UNIT TABS Take 1 tablet by mouth 2 (two) times a day.     . ibuprofen (ADVIL) 800 MG tablet Take 800 mg by mouth every 8 (eight) hours as needed.    . loratadine (CLARITIN REDITABS) 10 MG dissolvable tablet Take 10 mg by mouth at bedtime.    . montelukast (SINGULAIR) 10 MG tablet Take 1 tablet (10 mg total) by mouth at bedtime. 90 tablet 1  . Multiple  Vitamins-Minerals ([place] 50+) CAPS     . omeprazole (PRILOSEC) 40 MG capsule TAKE 1 CAPSULE BY MOUTH EVERY DAY 30 capsule 5   No current facility-administered medications for this visit.     Review of Systems  Constitutional: Negative for chills, diaphoresis, fever, malaise/fatigue and weight loss (fluctuates by 1-2 pounds).       Doing "fine".  HENT: Negative.  Negative for congestion, ear pain, hearing loss, nosebleeds, sinus pain and sore throat.   Eyes: Negative.  Negative for blurred vision, double vision, photophobia and pain.  Respiratory: Positive for cough (chronic). Negative for hemoptysis, sputum production and wheezing.   Cardiovascular: Negative.  Negative for chest pain, palpitations, orthopnea and PND.       Echo on 06/20/2018 revealed an EF of 60-65%.  Gastrointestinal: Negative.  Negative for abdominal pain, blood in stool, constipation, diarrhea, heartburn, melena, nausea and vomiting.  Genitourinary: Negative.  Negative for dysuria, frequency and hematuria.  Musculoskeletal: Positive for back pain (chronic, better) and joint pain (right knee). Negative for falls and myalgias.       Scoliosis.  Skin: Negative.  Negative for itching and rash.  Neurological: Negative.  Negative for dizziness, tingling, sensory change, speech change, focal weakness, weakness and headaches.  Endo/Heme/Allergies: Bruises/bleeds easily.  Psychiatric/Behavioral: Negative.  Negative for depression and memory loss. The patient is not nervous/anxious and does not have insomnia.   All other systems reviewed and are negative.   Performance status (ECOG): 0   Blood pressure 135/70, pulse 69, temperature 99 F (37.2 C), temperature source Tympanic, resp. rate 18, height '5\' 3"'  (1.6 m), weight 172 lb (78 kg), SpO2 98 %.  Physical Exam  Constitutional: She is oriented to person, place, and time. She appears well-developed and well-nourished. No distress.  She has a rolling walker by her side.   HENT:  Head: Normocephalic.  Right Ear: External ear normal.  Mouth/Throat: No oropharyngeal exudate.  Short gray hair.  Mask.  Eyes: Pupils are equal, round, and reactive to light. Conjunctivae are normal. No scleral icterus.  Blue eyes.  Neck: Normal range of motion. Neck supple. No JVD present.  Cardiovascular: Normal rate and normal heart sounds. Exam reveals no gallop.  No murmur heard. Pulmonary/Chest: No respiratory distress. She has no wheezes. She has no rales.  Left mastectomy.  No erythema or nodularity.  Right breast without masses, skin changes or nipple discharge.  Abdominal: Soft. Bowel sounds are normal. She exhibits no distension and no mass. There is no abdominal tenderness. There is no rebound and no guarding.  Musculoskeletal: Normal range of motion.  Lymphadenopathy:       Head (right side): No preauricular, no posterior auricular and no occipital adenopathy present.       Head (left side): No preauricular, no posterior auricular and no  occipital adenopathy present.    She has no cervical adenopathy.    She has no axillary adenopathy.       Right: No inguinal and no supraclavicular adenopathy present.       Left: No inguinal and no supraclavicular adenopathy present.  Neurological: She is alert and oriented to person, place, and time.  Skin: Skin is warm and dry. No rash noted. She is not diaphoretic. No erythema. No pallor.  Psychiatric: She has a normal mood and affect. Her behavior is normal. Judgment and thought content normal.  Nursing note and vitals reviewed.   Appointment on 04/05/2019  Component Date Value Ref Range Status  . Sodium 04/05/2019 137  135 - 145 mmol/L Final  . Potassium 04/05/2019 4.1  3.5 - 5.1 mmol/L Final  . Chloride 04/05/2019 102  98 - 111 mmol/L Final  . CO2 04/05/2019 26  22 - 32 mmol/L Final  . Glucose, Bld 04/05/2019 86  70 - 99 mg/dL Final  . BUN 04/05/2019 21  8 - 23 mg/dL Final  . Creatinine, Ser 04/05/2019 0.69  0.44 - 1.00  mg/dL Final  . Calcium 04/05/2019 9.4  8.9 - 10.3 mg/dL Final  . Total Protein 04/05/2019 7.2  6.5 - 8.1 g/dL Final  . Albumin 04/05/2019 4.1  3.5 - 5.0 g/dL Final  . AST 04/05/2019 19  15 - 41 U/L Final  . ALT 04/05/2019 19  0 - 44 U/L Final  . Alkaline Phosphatase 04/05/2019 69  38 - 126 U/L Final  . Total Bilirubin 04/05/2019 0.2* 0.3 - 1.2 mg/dL Final  . GFR calc non Af Amer 04/05/2019 >60  >60 mL/min Final  . GFR calc Af Amer 04/05/2019 >60  >60 mL/min Final  . Anion gap 04/05/2019 9  5 - 15 Final   Performed at [facility], [address] [place], [place] [address]    Assessment:  Connie Mitchell is a 85 y.o. female withstage IAleft breast cancers/p mastectomy and sentinel lymph node biopsy on 07/25/2018. Pathologyrevealed a 10 x 8 x 7 mm grade 1 invasive mammary carcinoma. Margins were negative. Four sentinel lymph nodes were negative. Tumor is ER + (> 90%), PR+ (> 90%), and Her2/neu 1+.Pathologic stage was T1bN0(sn). CA27.29was 30.3 on 07/07/2018.  Chest CTon 05/10/2018 revealed a 1.3 cm left breast mass.   Bilateral mammogram and ultrasoundon 06/29/2018 revealed lumpectomy changes in the lateral left breast. There was 1.8 x 1.7 x 1.0 cm lobulated mass in the upper outer quadrant. There was no enlarged adenopathy.  She has a history ofleft breast cancers/p lumpectomy in 1991. She received radiation. She declined chemotherapy and endocrine therapy.  Chest CT angiogram on 12/05/2018 revealed unchanged ascending thoracic aortic aneurysm measuring up to 4.5 cm.  Interval left mastectomy with 1.6 x 8.6 x 2.7 cm seroma.  Round 1.2 x 1.9 cm fluid density lesion in the inferior left axilla also likely represents a seroma at the site of interval sentinel lymph node dissection. New borderline enlarged right axillary lymph node measuring 10 mm in short axis, nonspecific.  Right diagnostic mammogram and ultrasound on 12/29/2018 revealed no suspicious  mammographic findings.  There was a single borderline right axillary lymph node demonstrating focal cortical thickening up to 4 mm. This lymph node was not  amenable to biopsy due to deep positioning and patient difficulty with raising her right arm due to recent shoulder surgery.  Bone densityon 11/29/2017 revealed osteopeniawith a T-score of -2.1 in the right femur.  Symptomatically, she  is doing well.  Exam reveals no left chest wall nodularity or paplable axillary adenopathy.  Plan: 1.   Labs today:  CBC with diff, CMP, CA27.29. 2.   Clinical stage IA left breast cancer Clinically, she continues to do well.               Mammogram and ultrasound revealed a single borderline right axillary lymph node with increased cortical hickening.  Schedule right axillary biopsy. 3. Osteopenia She has a T score of -2.1 in the right femur.    Continue oral calcium and vitamin D.   Patient not interested in bisphosphonates.  Information on Prolia provided. 4.   RTC for MD assessment (telephone) 2-3 days s/p breast biopsy to discuss results and direction of therapy.  I discussed the assessment and treatment plan with the patient.  The patient was provided an opportunity to ask questions and all were answered.  The patient agreed with the plan and demonstrated an understanding of the instructions.  The patient was advised to call back or seek an in person evaluation if the symptoms worsen or if the condition fails to improve as anticipated.   Nolon Stalls, MD, PhD  04/05/2019, 2:01 PM

## 2019-04-05 ENCOUNTER — Inpatient Hospital Stay: Payer: Medicare Other | Attending: Hematology and Oncology | Admitting: Hematology and Oncology

## 2019-04-05 ENCOUNTER — Encounter: Payer: Self-pay | Admitting: Hematology and Oncology

## 2019-04-05 ENCOUNTER — Other Ambulatory Visit: Payer: Self-pay

## 2019-04-05 ENCOUNTER — Inpatient Hospital Stay: Payer: Medicare Other

## 2019-04-05 VITALS — BP 135/70 | HR 69 | Temp 99.0°F | Resp 18 | Ht 63.0 in | Wt 172.0 lb

## 2019-04-05 DIAGNOSIS — Z791 Long term (current) use of non-steroidal anti-inflammatories (NSAID): Secondary | ICD-10-CM | POA: Diagnosis not present

## 2019-04-05 DIAGNOSIS — C50412 Malignant neoplasm of upper-outer quadrant of left female breast: Secondary | ICD-10-CM | POA: Diagnosis not present

## 2019-04-05 DIAGNOSIS — M85851 Other specified disorders of bone density and structure, right thigh: Secondary | ICD-10-CM

## 2019-04-05 DIAGNOSIS — N6331 Unspecified lump in axillary tail of the right breast: Secondary | ICD-10-CM | POA: Diagnosis not present

## 2019-04-05 DIAGNOSIS — M1990 Unspecified osteoarthritis, unspecified site: Secondary | ICD-10-CM | POA: Diagnosis not present

## 2019-04-05 DIAGNOSIS — C50912 Malignant neoplasm of unspecified site of left female breast: Secondary | ICD-10-CM | POA: Insufficient documentation

## 2019-04-05 DIAGNOSIS — Z170 Estrogen receptor positive status [ER+]: Secondary | ICD-10-CM

## 2019-04-05 DIAGNOSIS — K219 Gastro-esophageal reflux disease without esophagitis: Secondary | ICD-10-CM | POA: Diagnosis not present

## 2019-04-05 DIAGNOSIS — M8580 Other specified disorders of bone density and structure, unspecified site: Secondary | ICD-10-CM | POA: Insufficient documentation

## 2019-04-05 DIAGNOSIS — M549 Dorsalgia, unspecified: Secondary | ICD-10-CM | POA: Insufficient documentation

## 2019-04-05 DIAGNOSIS — I712 Thoracic aortic aneurysm, without rupture: Secondary | ICD-10-CM | POA: Diagnosis not present

## 2019-04-05 DIAGNOSIS — R590 Localized enlarged lymph nodes: Secondary | ICD-10-CM

## 2019-04-05 DIAGNOSIS — Z7189 Other specified counseling: Secondary | ICD-10-CM

## 2019-04-05 LAB — COMPREHENSIVE METABOLIC PANEL
ALT: 19 U/L (ref 0–44)
AST: 19 U/L (ref 15–41)
Albumin: 4.1 g/dL (ref 3.5–5.0)
Alkaline Phosphatase: 69 U/L (ref 38–126)
Anion gap: 9 (ref 5–15)
BUN: 21 mg/dL (ref 8–23)
CO2: 26 mmol/L (ref 22–32)
Calcium: 9.4 mg/dL (ref 8.9–10.3)
Chloride: 102 mmol/L (ref 98–111)
Creatinine, Ser: 0.69 mg/dL (ref 0.44–1.00)
GFR calc Af Amer: >60 mL/min (ref >60)
GFR calc non Af Amer: >60 mL/min (ref >60)
Glucose, Bld: 86 mg/dL (ref 70–99)
Potassium: 4.1 mmol/L (ref 3.5–5.1)
Sodium: 137 mmol/L (ref 135–145)
Total Bilirubin: 0.2 mg/dL — ABNORMAL LOW (ref 0.3–1.2)
Total Protein: 7.2 g/dL (ref 6.5–8.1)

## 2019-04-05 LAB — CBC WITH DIFFERENTIAL/PLATELET
Abs Immature Granulocytes: 0.01 10*3/uL (ref 0.00–0.07)
Basophils Absolute: 0.1 10*3/uL (ref 0.0–0.1)
Basophils Relative: 1 %
Eosinophils Absolute: 0.2 10*3/uL (ref 0.0–0.5)
Eosinophils Relative: 3 %
HCT: 43.1 % (ref 36.0–46.0)
Hemoglobin: 14.6 g/dL (ref 12.0–15.0)
Immature Granulocytes: 0 %
Lymphocytes Relative: 27 %
Lymphs Abs: 1.7 10*3/uL (ref 0.7–4.0)
MCH: 30.9 pg (ref 26.0–34.0)
MCHC: 33.9 g/dL (ref 30.0–36.0)
MCV: 91.1 fL (ref 80.0–100.0)
Monocytes Absolute: 0.5 10*3/uL (ref 0.1–1.0)
Monocytes Relative: 7 %
Neutro Abs: 4 10*3/uL (ref 1.7–7.7)
Neutrophils Relative %: 62 %
RBC: 4.73 MIL/uL (ref 3.87–5.11)
RDW: 15.1 % (ref 11.5–15.5)
WBC: 6.5 10*3/uL (ref 4.0–10.5)
nRBC: 0 % (ref 0.0–0.2)

## 2019-04-05 LAB — PLATELET BY CITRATE: Platelet CT in Citrate: 235

## 2019-04-05 NOTE — Patient Instructions (Signed)
Denosumab injection What is this medicine? DENOSUMAB (den oh sue mab) slows bone breakdown. Prolia is used to treat osteoporosis in women after menopause and in men, and in people who are taking corticosteroids for 6 months or more. Delton See is used to treat a high calcium level due to cancer and to prevent bone fractures and other bone problems caused by multiple myeloma or cancer bone metastases. Delton See is also used to treat giant cell tumor of the bone. This medicine may be used for other purposes; ask your health care provider or pharmacist if you have questions. COMMON BRAND NAME(S): Prolia, XGEVA What should I tell my health care provider before I take this medicine? They need to know if you have any of these conditions:  dental disease  having surgery or tooth extraction  infection  kidney disease  low levels of calcium or Vitamin D in the blood  malnutrition  on hemodialysis  skin conditions or sensitivity  thyroid or parathyroid disease  an unusual reaction to denosumab, other medicines, foods, dyes, or preservatives  pregnant or trying to get pregnant  breast-feeding How should I use this medicine? This medicine is for injection under the skin. It is given by a health care professional in a hospital or clinic setting. A special MedGuide will be given to you before each treatment. Be sure to read this information carefully each time. For Prolia, talk to your pediatrician regarding the use of this medicine in children. Special care may be needed. For Delton See, talk to your pediatrician regarding the use of this medicine in children. While this drug may be prescribed for children as young as 13 years for selected conditions, precautions do apply. Overdosage: If you think you have taken too much of this medicine contact a poison control center or emergency room at once. NOTE: This medicine is only for you. Do not share this medicine with others. What if I miss a dose? It is  important not to miss your dose. Call your doctor or health care professional if you are unable to keep an appointment. What may interact with this medicine? Do not take this medicine with any of the following medications:  other medicines containing denosumab This medicine may also interact with the following medications:  medicines that lower your chance of fighting infection  steroid medicines like prednisone or cortisone This list may not describe all possible interactions. Give your health care provider a list of all the medicines, herbs, non-prescription drugs, or dietary supplements you use. Also tell them if you smoke, drink alcohol, or use illegal drugs. Some items may interact with your medicine. What should I watch for while using this medicine? Visit your doctor or health care professional for regular checks on your progress. Your doctor or health care professional may order blood tests and other tests to see how you are doing. Call your doctor or health care professional for advice if you get a fever, chills or sore throat, or other symptoms of a cold or flu. Do not treat yourself. This drug may decrease your body's ability to fight infection. Try to avoid being around people who are sick. You should make sure you get enough calcium and vitamin D while you are taking this medicine, unless your doctor tells you not to. Discuss the foods you eat and the vitamins you take with your health care professional. See your dentist regularly. Brush and floss your teeth as directed. Before you have any dental work done, tell your dentist you are  receiving this medicine. Do not become pregnant while taking this medicine or for 5 months after stopping it. Talk with your doctor or health care professional about your birth control options while taking this medicine. Women should inform their doctor if they wish to become pregnant or think they might be pregnant. There is a potential for serious side  effects to an unborn child. Talk to your health care professional or pharmacist for more information. What side effects may I notice from receiving this medicine? Side effects that you should report to your doctor or health care professional as soon as possible:  allergic reactions like skin rash, itching or hives, swelling of the face, lips, or tongue  bone pain  breathing problems  dizziness  jaw pain, especially after dental work  redness, blistering, peeling of the skin  signs and symptoms of infection like fever or chills; cough; sore throat; pain or trouble passing urine  signs of low calcium like fast heartbeat, muscle cramps or muscle pain; pain, tingling, numbness in the hands or feet; seizures  unusual bleeding or bruising  unusually weak or tired Side effects that usually do not require medical attention (report to your doctor or health care professional if they continue or are bothersome):  constipation  diarrhea  headache  joint pain  loss of appetite  muscle pain  runny nose  tiredness  upset stomach This list may not describe all possible side effects. Call your doctor for medical advice about side effects. You may report side effects to FDA at 1-800-FDA-1088. Where should I keep my medicine? This medicine is only given in a clinic, doctor's office, or other health care setting and will not be stored at home. NOTE: This sheet is a summary. It may not cover all possible information. If you have questions about this medicine, talk to your doctor, pharmacist, or health care provider.  2020 Elsevier/Gold Standard (2017-10-08 16:10:44)

## 2019-04-05 NOTE — Progress Notes (Signed)
The patient c/o lower back pain 3 days ago she was taking ibuprofen 800 mg due to the increase pain.

## 2019-04-06 LAB — CANCER ANTIGEN 27.29: CA 27.29: 23.7 U/mL (ref 0.0–38.6)

## 2019-04-12 ENCOUNTER — Ambulatory Visit
Admission: RE | Admit: 2019-04-12 | Discharge: 2019-04-12 | Disposition: A | Discharge status: Home / Self Care | Payer: Medicare Other | Source: Ambulatory Visit | Attending: Hematology and Oncology | Admitting: Hematology and Oncology

## 2019-04-12 DIAGNOSIS — N6331 Unspecified lump in axillary tail of the right breast: Secondary | ICD-10-CM | POA: Diagnosis not present

## 2019-04-12 DIAGNOSIS — R928 Other abnormal and inconclusive findings on diagnostic imaging of breast: Secondary | ICD-10-CM

## 2019-04-12 DIAGNOSIS — R590 Localized enlarged lymph nodes: Secondary | ICD-10-CM | POA: Diagnosis not present

## 2019-04-13 LAB — SURGICAL PATHOLOGY

## 2019-04-26 DIAGNOSIS — M19071 Primary osteoarthritis, right ankle and foot: Secondary | ICD-10-CM | POA: Diagnosis not present

## 2019-04-26 DIAGNOSIS — M79671 Pain in right foot: Secondary | ICD-10-CM | POA: Diagnosis not present

## 2019-04-26 DIAGNOSIS — M79675 Pain in left toe(s): Secondary | ICD-10-CM | POA: Diagnosis not present

## 2019-04-26 DIAGNOSIS — M79674 Pain in right toe(s): Secondary | ICD-10-CM | POA: Diagnosis not present

## 2019-04-26 DIAGNOSIS — M79672 Pain in left foot: Secondary | ICD-10-CM | POA: Diagnosis not present

## 2019-04-26 DIAGNOSIS — M19072 Primary osteoarthritis, left ankle and foot: Secondary | ICD-10-CM | POA: Diagnosis not present

## 2019-04-26 DIAGNOSIS — B351 Tinea unguium: Secondary | ICD-10-CM | POA: Diagnosis not present

## 2019-05-29 ENCOUNTER — Other Ambulatory Visit: Payer: Self-pay

## 2019-05-29 ENCOUNTER — Ambulatory Visit: Payer: Medicare Other

## 2019-05-29 ENCOUNTER — Ambulatory Visit
Admission: EM | Admit: 2019-05-29 | Discharge: 2019-05-29 | Disposition: A | Discharge status: Home / Self Care | Payer: Medicare Other | Attending: Family Medicine | Admitting: Family Medicine

## 2019-05-29 DIAGNOSIS — S7011XA Contusion of right thigh, initial encounter: Secondary | ICD-10-CM

## 2019-05-29 DIAGNOSIS — S5001XA Contusion of right elbow, initial encounter: Secondary | ICD-10-CM | POA: Diagnosis not present

## 2019-05-29 DIAGNOSIS — R937 Abnormal findings on diagnostic imaging of other parts of musculoskeletal system: Secondary | ICD-10-CM | POA: Insufficient documentation

## 2019-05-29 DIAGNOSIS — M25521 Pain in right elbow: Secondary | ICD-10-CM | POA: Diagnosis present

## 2019-05-29 DIAGNOSIS — W010XXA Fall on same level from slipping, tripping and stumbling without subsequent striking against object, initial encounter: Secondary | ICD-10-CM | POA: Insufficient documentation

## 2019-05-29 DIAGNOSIS — W19XXXA Unspecified fall, initial encounter: Secondary | ICD-10-CM | POA: Diagnosis not present

## 2019-05-29 NOTE — Discharge Instructions (Signed)
Rest, ice, tylenol/advil as needed

## 2019-05-29 NOTE — ED Triage Notes (Addendum)
Pt reports she tripped over a root outside this afternoon and knocked over her rolling walker, then struck the brick steps out in front of her house. Has right elbow pain and lateral right thigh pain.

## 2019-05-31 NOTE — ED Provider Notes (Signed)
[facility]    CSN: [id number] Arrival date & time: 05/29/19  1709      History   Chief Complaint Chief Complaint  Patient presents with  . Fall    HPI Connie Mitchell is a 85 y.o. female.   85 yo female with a c/o right elbow pain and right thigh pain after tripping and falling outside today. States she fell on her right side. Had her walker which helped absorb some of the fall. Denies hitting her head or loss of consciousness. States she fell because she tripped over tree root.    Fall    Past Medical History:  Diagnosis Date  . Cancer ([place]) 1991   left breast. treated with rads and lumpectomy  . Cancer ([place]) 2020   newly diagnosed, also left breast  . Cellulitis and abscess of toe of left foot 01/2018  . GERD (gastroesophageal reflux disease)   . Hx of therapeutic radiation 1991   left breast  . Laryngopharyngeal reflux    sometimes food goes down wrong way and she ends up in extreme coughing/choking scenario  . Personal history of radiation therapy 1991   left breast ca  . Primary osteoarthritis   . Thoracic aortic aneurysm (TAA) [facility])     Patient Active Problem List   Diagnosis Date Noted  . Goals of care, counseling/discussion 04/05/2019  . Axillary adenopathy 01/01/2019  . Pulsatile abdomen 12/30/2018  . 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 Breast cancer, left ([place]) 07/25/2018  . Osteopenia of neck of right femur 07/12/2018  . Malignant neoplasm of upper-outer quadrant of left female breast ([place]) 07/07/2018  . Thoracic ascending aortic aneurysm ([place]) 05/27/2018  . LPRD (laryngopharyngeal reflux disease) 03/17/2018  . Post-nasal drip 03/17/2018  . Allergic cough 11/25/2017  . Arthritis involving multiple sites 11/25/2017  . Chronic foot pain 11/25/2017  . Lumbar degenerative disc disease 09/24/2017  . Hx of Lyme disease 09/24/2017  . Hx of basal cell carcinoma 09/24/2017  . Elevated blood pressure reading 09/24/2017  . Nuclear sclerotic  cataract of left eye 08/09/2015  . Osteoporosis 07/04/2014  . Primary localized osteoarthrosis of shoulder region 12/13/2012  . History of breast cancer 12/18/2011  . Osteoarthritis 12/18/2011    Past Surgical History:  Procedure Laterality Date  . BREAST LUMPECTOMY Left 1991   breast ca, lumpectomy  . cataracts Bilateral   . CYSTOCELE REPAIR  2010  . EYE SURGERY Bilateral 2017   cataracts extracted  . JOINT REPLACEMENT    . MASTECTOMY Left 07/25/2018   Invasive Mammary Carcinoma  . MASTECTOMY W/ SENTINEL NODE BIOPSY Left 07/25/2018   Procedure: MASTECTOMY WITH SENTINEL LYMPH NODE BIOPSY;  Surgeon: Olean Ree, MD;  Location: [facility] ORS;  Service: General;  Laterality: Left;  . OOPHORECTOMY  1995   ovarian mass - benign  . REPLACEMENT TOTAL KNEE Bilateral 2012,2013  . SKIN BIOPSY Left    arm growth  . TONSILLECTOMY  1958  . TOTAL SHOULDER ARTHROPLASTY Right 2016    OB History   No obstetric history on file.      Home Medications    Prior to Admission medications   Medication Sig Start Date End Date Taking? Authorizing Provider  Calcium Carbonate-Vitamin D (CALCIUM-VITAMIN D) 600-125 MG-UNIT TABS Take 1 tablet by mouth 2 (two) times a day.     [provider]  ibuprofen (ADVIL) 800 MG tablet Take 800 mg by mouth every 8 (eight) hours as needed.    [provider]  loratadine (CLARITIN  REDITABS) 10 MG dissolvable tablet Take 10 mg by mouth at bedtime.    [provider]  montelukast (SINGULAIR) 10 MG tablet Take 1 tablet (10 mg total) by mouth at bedtime. 11/28/18   Glean Hess, MD  Multiple Vitamins-Minerals ([place] 50+) CAPS     [provider]  omeprazole (PRILOSEC) 40 MG capsule TAKE 1 CAPSULE BY MOUTH EVERY DAY 03/23/19   Glean Hess, MD    Family History Family History  Problem Relation Age of Onset  . Hypertension Mother   . Alzheimer's disease Mother   . Stroke Father   . Cancer Brother        prostate  .  Atrial fibrillation Brother   . Cancer Other        breast  . Atrial fibrillation Brother   . Breast cancer Cousin 72       paternal x 2    Social History Social History   Tobacco Use  . Smoking status: Never Smoker  . Smokeless tobacco: Never Used  . Tobacco comment: smoking cessation materials not required  Substance Use Topics  . Alcohol use: Yes    Comment: occasional  . Drug use: Never     Allergies   Ciprofloxacin   Review of Systems Review of Systems   Physical Exam Triage Vital Signs ED Triage Vitals  Enc Vitals Group     BP 05/29/19 1729 (!) 162/90     Pulse Rate 05/29/19 1729 66     Resp 05/29/19 1729 18     Temp 05/29/19 1729 98.2 F (36.8 C)     Temp Source 05/29/19 1729 Oral     SpO2 05/29/19 1729 100 %     Weight 05/29/19 1727 173 lb (78.5 kg)     Height 05/29/19 1727 5\' 3"  (1.6 m)     Head Circumference --      Peak Flow --      Pain Score 05/29/19 1727 3     Pain Loc --      Pain Edu? --      Excl. in [place]? --    No data found.  Updated Vital Signs BP (!) 162/90 (BP Location: Left Arm)   Pulse 66 Comment: irregular  Temp 98.2 F (36.8 C) (Oral)   Resp 18   Ht 5\' 3"  (1.6 m)   Wt 78.5 kg   SpO2 100%   BMI 30.65 kg/m   Visual Acuity Right Eye Distance:   Left Eye Distance:   Bilateral Distance:    Right Eye Near:   Left Eye Near:    Bilateral Near:     Physical Exam Vitals and nursing note reviewed.  Constitutional:      General: She is not in acute distress.    Appearance: She is not toxic-appearing or diaphoretic.  Musculoskeletal:     Right elbow: Swelling (mild) present. No deformity, effusion or lacerations. Normal range of motion. Tenderness present in olecranon process (only point tender at olecranon process). No radial head (no radial head tenderness), medial epicondyle or lateral epicondyle tenderness.     Right hip: Tenderness present. No deformity, lacerations or crepitus. Normal range of motion. Normal strength.      Right upper leg: Swelling and tenderness (moderate, mid thigh lateral) present. No deformity or lacerations.  Neurological:     Mental Status: She is alert.      UC Treatments / Results  Labs (all labs ordered are listed, but only abnormal results are  displayed) Labs Reviewed - No data to display  EKG   Radiology DG Elbow Complete Right  Result Date: 05/29/2019 CLINICAL DATA:  Pt states she tripped today injuring right thigh, elbow and hip. Most pain olecranon process and right lat hip into thighfall EXAM: RIGHT ELBOW - COMPLETE 3+ VIEW COMPARISON:  None. FINDINGS: A sclerotic rim surrounds the radial head at the level of the neck. No dislocation. No joint effusion. Olecranon is normal. A potential 2 soft tissue injury over the olecranon. IMPRESSION: Sclerotic ring surrounding the radial head with differential including impaction fracture of radius head versus a ring of osteophytes about the radial neck versus a combination of both. Recommend clinical correlation for point tenderness over the radial head.a. Electronically Signed   By: Suzy Bouchard M.D.   On: 05/29/2019 18:41   DG Hip Unilat With Pelvis 2-3 Views Right  Result Date: 05/29/2019 CLINICAL DATA:  Fall.  Right hip pain EXAM: DG HIP (WITH OR WITHOUT PELVIS) 2-3V RIGHT COMPARISON:  None. FINDINGS: Hip joints and SI joints are symmetric and unremarkable. Degenerative changes and scoliosis in the visualized lower lumbar spine. No acute bony abnormality. Specifically, no fracture, subluxation, or dislocation. IMPRESSION: No acute bony abnormality. Electronically Signed   By: Rolm Baptise M.D.   On: 05/29/2019 18:39   DG Femur Min 2 Views Right  Result Date: 05/29/2019 CLINICAL DATA:  Fall, right thigh pain EXAM: RIGHT FEMUR 2 VIEWS COMPARISON:  Right hip series today FINDINGS: Prior right knee replacement. No acute bony abnormality. Specifically, no fracture, subluxation, or dislocation. No joint effusion within the right  knee. IMPRESSION: No acute bony abnormality. Electronically Signed   By: Rolm Baptise M.D.   On: 05/29/2019 18:45    Procedures Procedures (including critical care time)  Medications Ordered in UC Medications - No data to display  Initial Impression / Assessment and Plan / UC Course  I have reviewed the triage vital signs and the nursing notes.  Pertinent labs & imaging results that were available during my care of the patient were reviewed by me and considered in my medical decision making (see chart for details).      Final Clinical Impressions(s) / UC Diagnoses   Final diagnoses:  Fall  Contusion of right elbow, initial encounter  Contusion of right thigh, initial encounter     Discharge Instructions     Rest, ice, tylenol/advil as needed    ED Prescriptions    None     1. x-ray results and diagnosis reviewed with patient 2. Recommend supportive treatment as above 3. Follow-up prn if symptoms worsen or don't improve  PDMP not reviewed this encounter.   Norval Gable, MD 05/31/19 [phone]

## 2019-06-14 ENCOUNTER — Other Ambulatory Visit: Payer: Self-pay | Admitting: Internal Medicine

## 2019-06-14 DIAGNOSIS — R05 Cough: Secondary | ICD-10-CM

## 2019-06-14 DIAGNOSIS — R058 Other specified cough: Secondary | ICD-10-CM

## 2019-06-23 ENCOUNTER — Encounter: Payer: Self-pay | Admitting: Internal Medicine

## 2019-07-03 IMAGING — US ULTRASOUND LEFT BREAST LIMITED
1 series · 11 of 11 positions shown · non-contrast
Comparison: None.

Addendum:
CLINICAL DATA: History of left breast cancer status post lumpectomy
in 7227. Left breast mass seen on recent CT scan of the chest dated
05/10/2018.

EXAM:
DIGITAL DIAGNOSTIC BILATERAL MAMMOGRAM WITH CAD AND TOMO
ULTRASOUND LEFT BREAST

[Series 1: ultrasound left breast limited · 0.07mm/px · 11 of 11 slices shown]
[im 1/11]
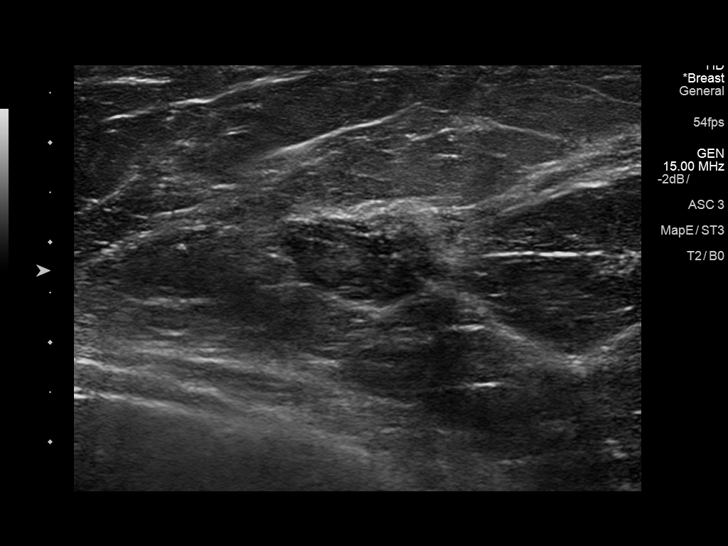
[im 2/11]
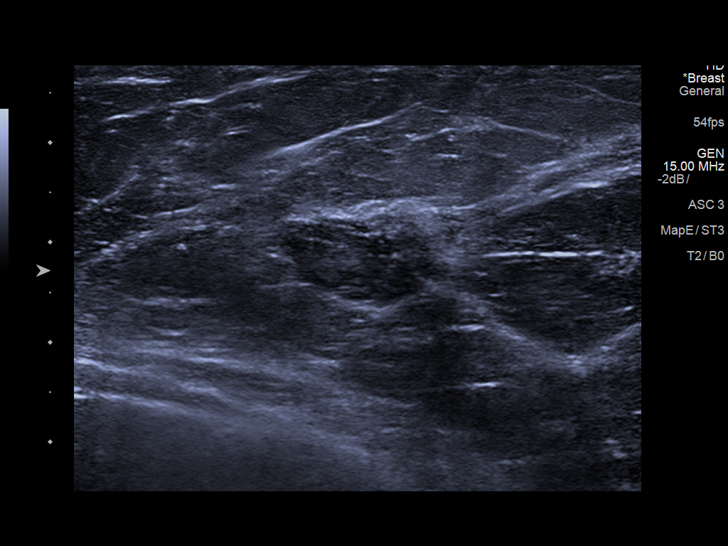
[im 3/11]
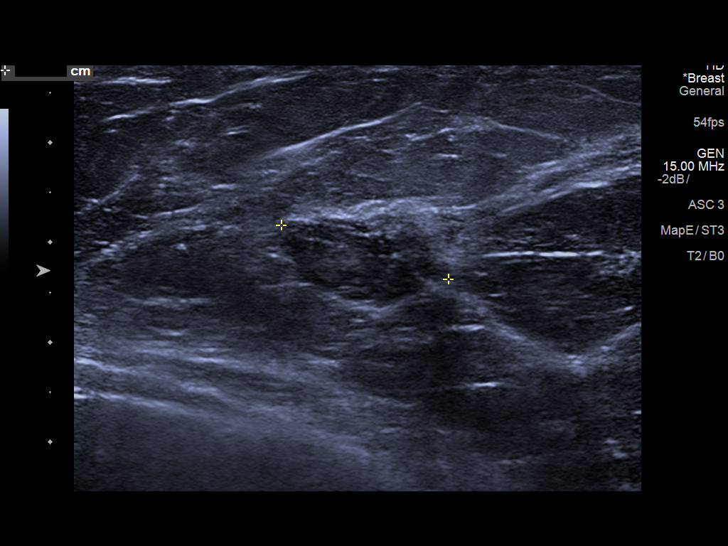
[im 4/11]
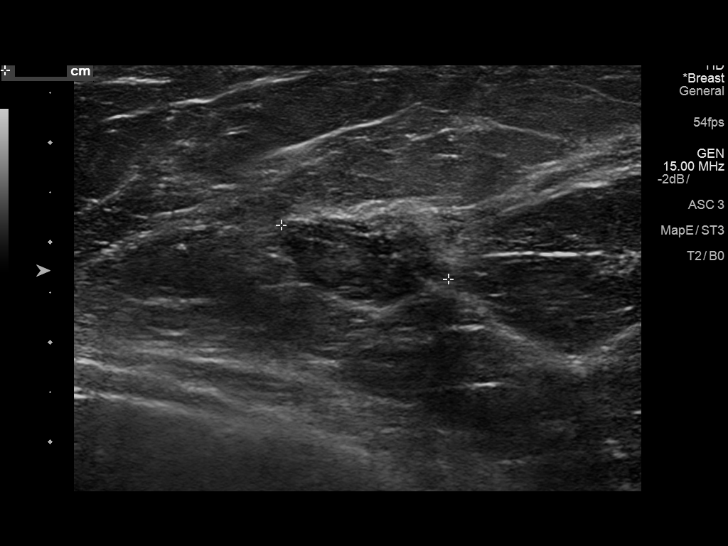
[im 5/11]
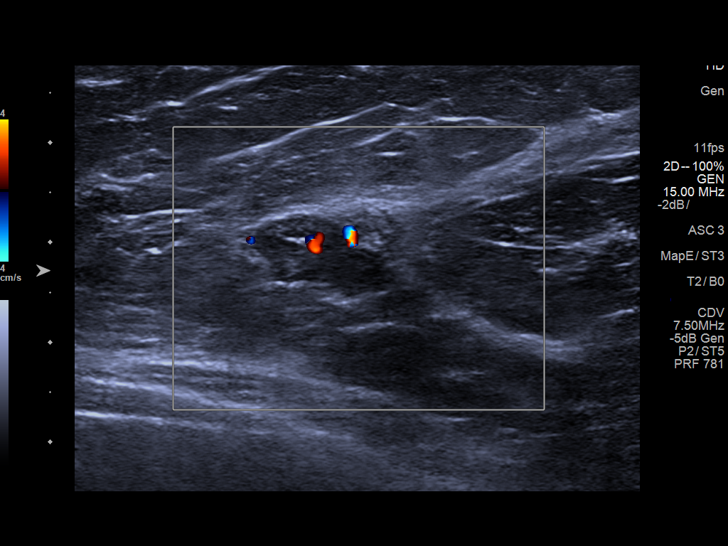
[im 6/11]
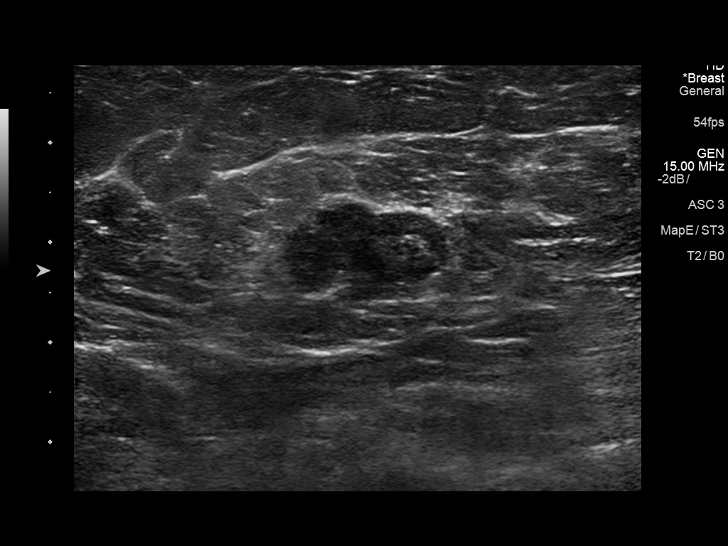
[im 7/11]
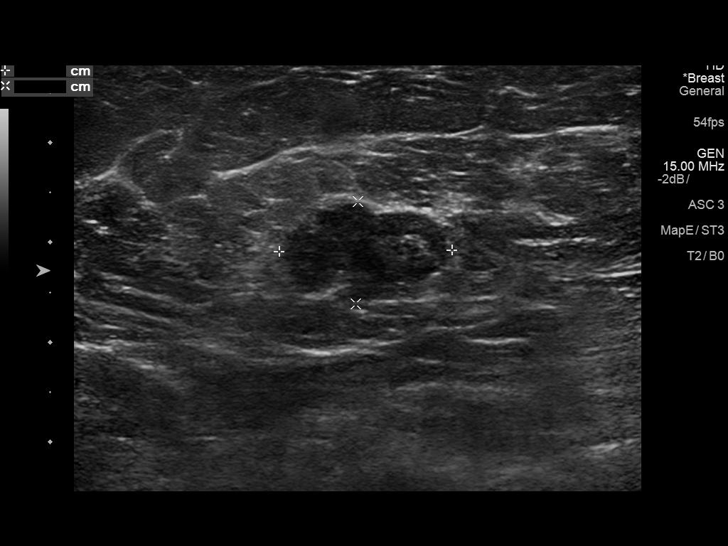
[im 8/11]
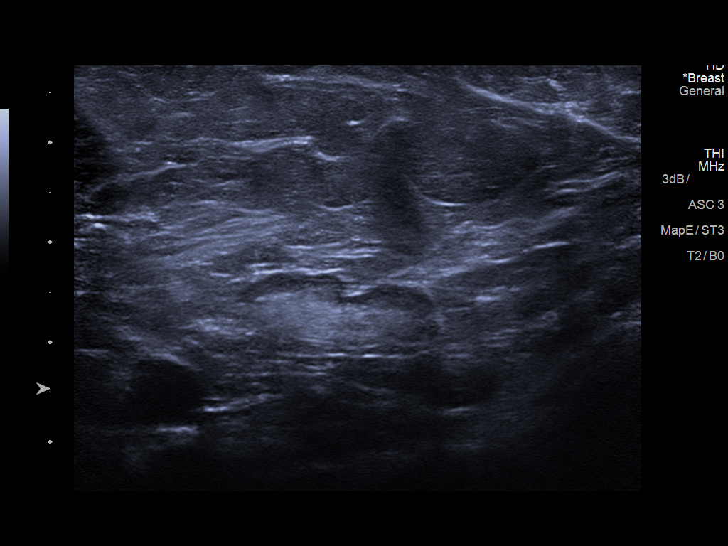
[im 9/11]
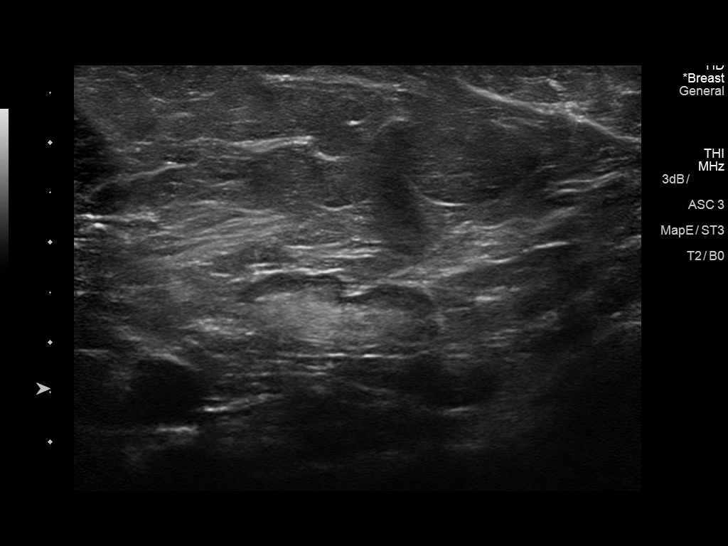
[im 10/11]
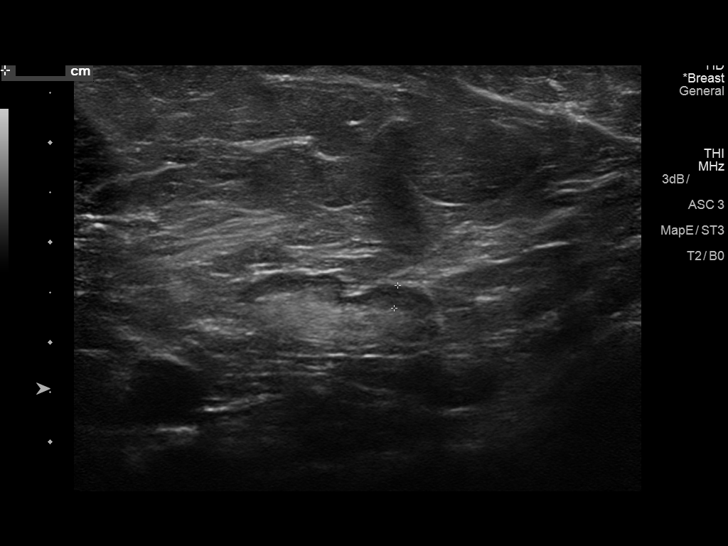
[im 11/11]
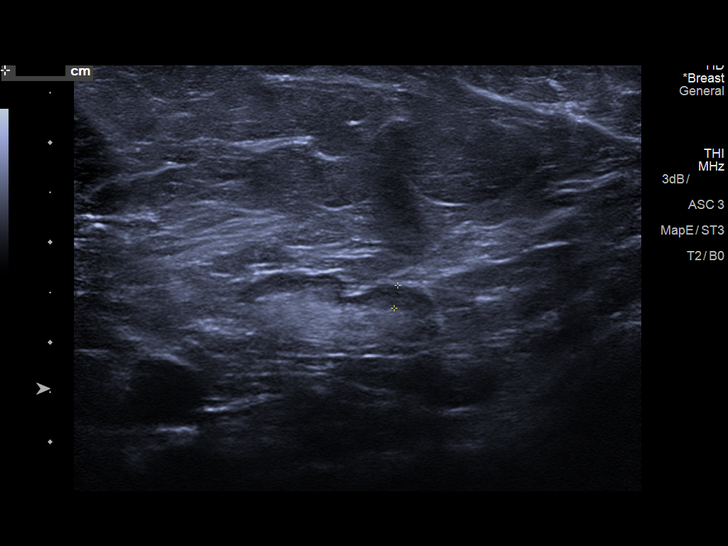

[11 of 11 positions shown; findings below may reference images not displayed]

ACR Breast Density Category b: There are scattered areas of
fibroglandular density.
FINDINGS: No suspicious mass or malignant type microcalcifications identified
in the right breast.

Lumpectomy changes are seen in the lateral aspect of the left
breast. There is a 1.7 cm lobulated mass in the upper-outer quadrant
of the left breast. Extending anterior to the mass is 6 cm of
asymmetric linear fibroglandular tissue. There is also asymmetric
irregular fibroglandular tissue posterior to the mass. This linear
asymmetric fibroglandular tissue extending anterior from the mass
and the asymmetric fibroglandular tissue posterior to the mass may
be secondary to changes from the prior lumpectomy but malignancy
could not be excluded. There are no malignant type
microcalcifications.

Mammographic images were processed with CAD.

On physical exam, there is firmness of the upper-outer quadrant of
the left breast with no palpable mass.

Targeted ultrasound is performed, showing there is an irregular
hypoechoic mass in left breast at 2 o'clock 7 cm from the nipple
measuring 1.8 x 1.7 x 1.0 cm. Sonographic evaluation of the left
axilla does not show any enlarged adenopathy.
IMPRESSION: Suspicious mass in the 2 o'clock region of the left breast.
Indeterminate asymmetric fibroglandular tissue anterior and
posterior to the suspicious mass.

RECOMMENDATION:
Ultrasound-guided core biopsy of the mass in the 2 o'clock region of
the left breast is recommended. If the mass is positive for
malignancy breast MRI is recommended to evaluate the areas of
asymmetric fibroglandular tissue in the left breast.

I have discussed the findings and recommendations with the patient.
Results were also provided in writing at the conclusion of the
visit. If applicable, a reminder letter will be sent to the patient
regarding the next appointment.

BI-RADS CATEGORY  4: Suspicious.

ADDENDUM:
Prior mammograms dated 05/27/2012 have become available for
comparison. The 1.7 cm lobulated mass in the upper-outer quadrant of
the left breast has developed since the prior exam. The asymmetric
tissue posterior and anterior to the mass is stable from the prior
exam and therefore felt to be benign.
IMPRESSION: Suspicious mass in the 2 o'clock region of the left breast.

Recommendation: Ultrasound-guided core biopsy of the mass in the 2
o'clock region of the left breast. Asymmetries anterior and
posterior to the mass are stable from prior exam and felt to be
benign.

BI-RADS category:  4: Suspicious.

*** End of Addendum ***

## 2019-07-11 ENCOUNTER — Encounter: Payer: Self-pay | Admitting: Internal Medicine

## 2019-08-23 ENCOUNTER — Encounter: Payer: Self-pay | Admitting: Surgery

## 2019-08-23 ENCOUNTER — Ambulatory Visit (INDEPENDENT_AMBULATORY_CARE_PROVIDER_SITE_OTHER): Payer: Medicare Other | Admitting: Surgery

## 2019-08-23 ENCOUNTER — Other Ambulatory Visit: Payer: Self-pay

## 2019-08-23 ENCOUNTER — Ambulatory Visit: Payer: Medicare Other | Admitting: Surgery

## 2019-08-23 VITALS — BP 127/84 | HR 72 | Temp 94.3°F | Ht 63.5 in | Wt 175.0 lb

## 2019-08-23 DIAGNOSIS — Z170 Estrogen receptor positive status [ER+]: Secondary | ICD-10-CM | POA: Diagnosis not present

## 2019-08-23 DIAGNOSIS — C50412 Malignant neoplasm of upper-outer quadrant of left female breast: Secondary | ICD-10-CM | POA: Diagnosis not present

## 2019-08-23 NOTE — Progress Notes (Signed)
08/23/2019  History of Present Illness: Connie Mitchell is a 86 y.o. female s/p left mastectomy and SLNBx for left breast recurrent cancer in 07/2018.  She was last seen on 02/24/19 at which time she was waiting for follow up U/S of the right axilla to evaluate an enlarged lymph node.  Eventually, she had a biopsy on 10/28 which was negative.  The patient reports today that she's been doing well.  Denies any issues on the left side and denies any palpable masses, skin changes, or issues with the incision.  However, she does report that she recently had some tenderness in the right breast, towards the lower outer quadrant.    Past Medical History: Past Medical History:  Diagnosis Date  . Cancer ([place]) 1991   left breast. treated with rads and lumpectomy  . Cancer ([place]) 2020   newly diagnosed, also left breast  . Cellulitis and abscess of toe of left foot 01/2018  . GERD (gastroesophageal reflux disease)   . Hx of therapeutic radiation 1991   left breast  . Laryngopharyngeal reflux    sometimes food goes down wrong way and she ends up in extreme coughing/choking scenario  . Personal history of radiation therapy 1991   left breast ca  . Primary osteoarthritis   . Thoracic aortic aneurysm (TAA) ([place])      Past Surgical History: Past Surgical History:  Procedure Laterality Date  . BREAST LUMPECTOMY Left 1991   breast ca, lumpectomy  . cataracts Bilateral   . CYSTOCELE REPAIR  2010  . EYE SURGERY Bilateral 2017   cataracts extracted  . JOINT REPLACEMENT    . MASTECTOMY Left 07/25/2018   Invasive Mammary Carcinoma  . MASTECTOMY W/ SENTINEL NODE BIOPSY Left 07/25/2018   Procedure: MASTECTOMY WITH SENTINEL LYMPH NODE BIOPSY;  Surgeon: Olean Ree, MD;  Location: [facility] ORS;  Service: General;  Laterality: Left;  . OOPHORECTOMY  1995   ovarian mass - benign  . REPLACEMENT TOTAL KNEE Bilateral 2012,2013  . SKIN BIOPSY Left    arm growth  . TONSILLECTOMY  1958  . TOTAL SHOULDER  ARTHROPLASTY Right 2016    Home Medications: Prior to Admission medications   Medication Sig Start Date End Date Taking? Authorizing Provider  Calcium Carbonate-Vitamin D (CALCIUM-VITAMIN D) 600-125 MG-UNIT TABS Take 1 tablet by mouth 2 (two) times a day.    Yes [provider]  ibuprofen (ADVIL) 800 MG tablet Take 800 mg by mouth every 8 (eight) hours as needed.   Yes [provider]  loratadine (CLARITIN REDITABS) 10 MG dissolvable tablet Take 10 mg by mouth at bedtime.   Yes [provider]  montelukast (SINGULAIR) 10 MG tablet TAKE 1 TABLET BY MOUTH EVERYDAY AT BEDTIME 06/14/19  Yes Glean Hess, MD  Multiple Vitamins-Minerals ([place] 50+) CAPS    Yes [provider]  omeprazole (PRILOSEC) 40 MG capsule TAKE 1 CAPSULE BY MOUTH EVERY DAY 03/23/19  Yes Glean Hess, MD    Allergies: Allergies  Allergen Reactions  . Ciprofloxacin Nausea And Vomiting    Review of Systems: Review of Systems  Constitutional: Negative for chills and fever.  Respiratory: Negative for shortness of breath.   Cardiovascular: Negative for chest pain.  Gastrointestinal: Negative for abdominal pain and nausea.    Physical Exam BP 127/84   Pulse 72   Temp (!) 94.3 F (34.6 C) (Temporal)   Ht 5' 3.5" (1.613 m)   Wt 175 lb (79.4 kg)   SpO2 97%  BMI 30.51 kg/m  CONSTITUTIONAL: No acute distress HEENT:  Normocephalic, atraumatic, extraocular motion intact. RESPIRATORY:  Lungs are clear, and breath sounds are equal bilaterally. Normal respiratory effort without pathologic use of accessory muscles. CARDIOVASCULAR: Heart is regular without murmurs, gallops, or rubs. BREAST:  Right breast without any palpable masses.  The area of concern where the patient points, around 7 o clock, 5 cm from nipple, has some discomfort but I am unable to palpate any masses, scar, or changes.  There is no right axillary lymphadenopathy either.  On the left side, the patient's  mastectomy site is well healed, without any wound breakdown, no palpable masses or other skin changes.  No left axillary lymphadenopathy. NEUROLOGIC:  Motor and sensation is grossly normal.  Cranial nerves are grossly intact. PSYCH:  Alert and oriented to person, place and time. Affect is normal.  Labs/Imaging: None recently  Assessment and Plan: This is a 86 y.o. female s/p left mastectomy and SLNBx in 07/2018, as well as right axillary node biopsy 04/12/19.  --Discussed with the patient that currently I do not feel any abnormalities on her right breast to account for the discomfort that she was feeling.  Offered her the option of obtaining a new ultrasound of the breast to evaluate, vs doing watchful waiting with follow up in 03/2020 for diagnostic mammogram.  She has opted for waiting until October.  However, she does understand that if the pain persists or if she palpates any changes, she should let us know right away so we can schedule her sooner.  Face-to-face time spent with the patient and care providers was 15 minutes, with more than 50% of the time spent counseling, educating, and coordinating care of the patient.     Melvyn Neth, [facility]

## 2019-08-23 NOTE — Patient Instructions (Addendum)
Dr.Piscoya advised patient to watch for any new changes in the breast until patient has follow up Mammo in August.

## 2019-09-17 ENCOUNTER — Other Ambulatory Visit: Payer: Self-pay | Admitting: Internal Medicine

## 2019-11-01 ENCOUNTER — Encounter: Payer: Self-pay | Admitting: Internal Medicine

## 2019-11-01 ENCOUNTER — Encounter: Payer: Self-pay | Admitting: Hematology and Oncology

## 2019-11-14 ENCOUNTER — Telehealth: Payer: Self-pay | Admitting: Internal Medicine

## 2019-11-14 NOTE — Telephone Encounter (Signed)
Do I set her up for a CPE, or AWV with nurse health advisor?     Copied from [place] [id number]. Topic: Appointment Scheduling - Scheduling Inquiry for Clinic >> Nov 10, 2019  4:33 PM Oneta Rack wrote: Patient requested a physical appontment but the DT advise this would be a medicare wellness, allowing me to schedule 11/21/2019 at 1:20 pm. Please advise based off the PCP preferences if this appointment was scheduled appropriately. Unable to connect with the office.

## 2019-11-15 ENCOUNTER — Ambulatory Visit: Payer: BLUE CROSS/BLUE SHIELD

## 2019-11-21 ENCOUNTER — Encounter: Payer: Medicare Other | Admitting: Internal Medicine

## 2019-11-27 ENCOUNTER — Ambulatory Visit (INDEPENDENT_AMBULATORY_CARE_PROVIDER_SITE_OTHER): Payer: Medicare Other

## 2019-11-27 VITALS — BP 111/70 | HR 68 | Ht 64.0 in | Wt 169.0 lb

## 2019-11-27 DIAGNOSIS — Z0000 Encounter for general adult medical examination without abnormal findings: Secondary | ICD-10-CM

## 2019-11-27 NOTE — Patient Instructions (Signed)
Connie Mitchell , Thank you for taking time to come for your Medicare Wellness Visit. I appreciate your ongoing commitment to your health goals. Please review the following plan we discussed and let me know if I can assist you in the future.   Screening recommendations/referrals: Colonoscopy: no longer required Mammogram: done 04/10/19 Bone Density: done 11/29/17 Recommended yearly ophthalmology/optometry visit for glaucoma screening and checkup Recommended yearly dental visit for hygiene and checkup  Vaccinations: Influenza vaccine: postponed Pneumococcal vaccine: done 2017 Tdap vaccine: done 03/11/12 Shingles vaccine: Shingrix discussed. Please contact your pharmacy for coverage information.  Covid-19:please bring your vaccination record with you to your next appt  Advanced directives: Please bring a copy of your health care power of attorney and living will to the office at your convenience once you have completed those documents.   Conditions/risks identified: Keep up the great work!  Next appointment: Follow up in one year for your annual wellness visit    Preventive Care 65 Years and Older, Female Preventive care refers to lifestyle choices and visits with your health care provider that can promote health and wellness. What does preventive care include?  A yearly physical exam. This is also called an annual well check.  Dental exams once or twice a year.  Routine eye exams. Ask your health care provider how often you should have your eyes checked.  Personal lifestyle choices, including:  Daily care of your teeth and gums.  Regular physical activity.  Eating a healthy diet.  Avoiding tobacco and drug use.  Limiting alcohol use.  Practicing safe sex.  Taking low-dose aspirin every day.  Taking vitamin and mineral supplements as recommended by your health care provider. What happens during an annual well check? The services and screenings done by your health care  provider during your annual well check will depend on your age, overall health, lifestyle risk factors, and family history of disease. Counseling  Your health care provider may ask you questions about your:  Alcohol use.  Tobacco use.  Drug use.  Emotional well-being.  Home and relationship well-being.  Sexual activity.  Eating habits.  History of falls.  Memory and ability to understand (cognition).  Work and work Statistician.  Reproductive health. Screening  You may have the following tests or measurements:  Height, weight, and BMI.  Blood pressure.  Lipid and cholesterol levels. These may be checked every 5 years, or more frequently if you are over 26 years old.  Skin check.  Lung cancer screening. You may have this screening every year starting at age 37 if you have a 30-pack-year history of smoking and currently smoke or have quit within the past 15 years.  Fecal occult blood test (FOBT) of the stool. You may have this test every year starting at age 92.  Flexible sigmoidoscopy or colonoscopy. You may have a sigmoidoscopy every 5 years or a colonoscopy every 10 years starting at age 25.  Hepatitis C blood test.  Hepatitis B blood test.  Sexually transmitted disease (STD) testing.  Diabetes screening. This is done by checking your blood sugar (glucose) after you have not eaten for a while (fasting). You may have this done every 1-3 years.  Bone density scan. This is done to screen for osteoporosis. You may have this done starting at age 66.  Mammogram. This may be done every 1-2 years. Talk to your health care provider about how often you should have regular mammograms. Talk with your health care provider about your test results, treatment  options, and if necessary, the need for more tests. Vaccines  Your health care provider may recommend certain vaccines, such as:  Influenza vaccine. This is recommended every year.  Tetanus, diphtheria, and acellular  pertussis (Tdap, Td) vaccine. You may need a Td booster every 10 years.  Zoster vaccine. You may need this after age 53.  Pneumococcal 13-valent conjugate (PCV13) vaccine. One dose is recommended after age 80.  Pneumococcal polysaccharide (PPSV23) vaccine. One dose is recommended after age 49. Talk to your health care provider about which screenings and vaccines you need and how often you need them. This information is not intended to replace advice given to you by your health care provider. Make sure you discuss any questions you have with your health care provider. Document Released: 06/28/2015 Document Revised: 02/19/2016 Document Reviewed: 04/02/2015 Elsevier Interactive Patient Education  2017 [place] Prevention in the Home Falls can cause injuries. They can happen to people of all ages. There are many things you can do to make your home safe and to help prevent falls. What can I do on the outside of my home?  Regularly fix the edges of walkways and driveways and fix any cracks.  Remove anything that might make you trip as you walk through a door, such as a raised step or threshold.  Trim any bushes or trees on the path to your home.  Use bright outdoor lighting.  Clear any walking paths of anything that might make someone trip, such as rocks or tools.  Regularly check to see if handrails are loose or broken. Make sure that both sides of any steps have handrails.  Any raised decks and porches should have guardrails on the edges.  Have any leaves, snow, or ice cleared regularly.  Use sand or salt on walking paths during winter.  Clean up any spills in your garage right away. This includes oil or grease spills. What can I do in the bathroom?  Use night lights.  Install grab bars by the toilet and in the tub and shower. Do not use towel bars as grab bars.  Use non-skid mats or decals in the tub or shower.  If you need to sit down in the shower, use a plastic,  non-slip stool.  Keep the floor dry. Clean up any water that spills on the floor as soon as it happens.  Remove soap buildup in the tub or shower regularly.  Attach bath mats securely with double-sided non-slip rug tape.  Do not have throw rugs and other things on the floor that can make you trip. What can I do in the bedroom?  Use night lights.  Make sure that you have a light by your bed that is easy to reach.  Do not use any sheets or blankets that are too big for your bed. They should not hang down onto the floor.  Have a firm chair that has side arms. You can use this for support while you get dressed.  Do not have throw rugs and other things on the floor that can make you trip. What can I do in the kitchen?  Clean up any spills right away.  Avoid walking on wet floors.  Keep items that you use a lot in easy-to-reach places.  If you need to reach something above you, use a strong step stool that has a grab bar.  Keep electrical cords out of the way.  Do not use floor polish or wax that makes floors slippery.  If you must use wax, use non-skid floor wax.  Do not have throw rugs and other things on the floor that can make you trip. What can I do with my stairs?  Do not leave any items on the stairs.  Make sure that there are handrails on both sides of the stairs and use them. Fix handrails that are broken or loose. Make sure that handrails are as long as the stairways.  Check any carpeting to make sure that it is firmly attached to the stairs. Fix any carpet that is loose or worn.  Avoid having throw rugs at the top or bottom of the stairs. If you do have throw rugs, attach them to the floor with carpet tape.  Make sure that you have a light switch at the top of the stairs and the bottom of the stairs. If you do not have them, ask someone to add them for you. What else can I do to help prevent falls?  Wear shoes that:  Do not have high heels.  Have rubber  bottoms.  Are comfortable and fit you well.  Are closed at the toe. Do not wear sandals.  If you use a stepladder:  Make sure that it is fully opened. Do not climb a closed stepladder.  Make sure that both sides of the stepladder are locked into place.  Ask someone to hold it for you, if possible.  Clearly mark and make sure that you can see:  Any grab bars or handrails.  First and last steps.  Where the edge of each step is.  Use tools that help you move around (mobility aids) if they are needed. These include:  Canes.  Walkers.  Scooters.  Crutches.  Turn on the lights when you go into a dark area. Replace any light bulbs as soon as they burn out.  Set up your furniture so you have a clear path. Avoid moving your furniture around.  If any of your floors are uneven, fix them.  If there are any pets around you, be aware of where they are.  Review your medicines with your doctor. Some medicines can make you feel dizzy. This can increase your chance of falling. Ask your doctor what other things that you can do to help prevent falls. This information is not intended to replace advice given to you by your health care provider. Make sure you discuss any questions you have with your health care provider. Document Released: 03/28/2009 Document Revised: 11/07/2015 Document Reviewed: 07/06/2014 Elsevier Interactive Patient Education  2017 [employer].

## 2019-11-27 NOTE — Progress Notes (Signed)
Subjective:   Connie Mitchell is a 86 y.o. female who presents for Medicare Annual (Subsequent) preventive examination.  Virtual Visit via Telephone Note  I connected with  Connie Mitchell on 11/27/19 at 10:40 AM EDT by telephone and verified that I am speaking with the correct person using two identifiers.  Medicare Annual Wellness visit completed telephonically due to Covid-19 pandemic.   Location: Patient: home Provider: office   I discussed the limitations, risks, security and privacy concerns of performing an evaluation and management service by telephone and the availability of in person appointments. The patient expressed understanding and agreed to proceed.  Unable to perform video visit due to video visit attempted and failed and/or patient does not have video capability.   Some vital signs may be absent or patient reported.   Connie Marker, LPN    Review of Systems:   Cardiac Risk Factors include: advanced age (>65men, >13 women)     Objective:     Vitals: BP 111/70   Pulse 68   Ht 5\' 4"  (1.626 m)   Wt 169 lb (76.7 kg)   BMI 29.01 kg/m   Body mass index is 29.01 kg/m.  Advanced Directives 11/27/2019 05/29/2019 04/03/2019 02/02/2019 01/12/2019 12/15/2018 11/21/2018  Does Patient Have a Medical Advance Directive? No No No No No No No  Type of Advance Directive - - - - - - -  Does patient want to make changes to medical advance directive? - - No - Patient declined - - - No - Patient declined  Copy of [place] in Chart? - - - - - - -  Would patient like information on creating a medical advance directive? No - Patient declined No - Patient declined - - No - Patient declined No - Patient declined -    Tobacco Social History   Tobacco Use  Smoking Status Never Smoker  Smokeless Tobacco Never Used  Tobacco Comment   smoking cessation materials not required     Counseling given: Not Answered Comment: smoking cessation materials not  required   Clinical Intake:  Pre-visit preparation completed: Yes  Pain : 0-10 Pain Score: 10-Worst pain ever Pain Type: Chronic pain Pain Location: Elbow Pain Orientation: Left Pain Onset: More than a month ago Pain Frequency: Constant     Nutritional Risks: None Diabetes: No  How often do you need to have someone help you when you read instructions, pamphlets, or other written materials from your doctor or pharmacy?: 1 - Never  Interpreter Needed?: No  Information entered by :: Connie Rakes, LPN  Past Medical History:  Diagnosis Date  . Cancer ([place]) 1991   left breast. treated with rads and lumpectomy  . Cancer ([place]) 2020   newly diagnosed, also left breast  . Cellulitis and abscess of toe of left foot 01/2018  . GERD (gastroesophageal reflux disease)   . Hx of therapeutic radiation 1991   left breast  . Laryngopharyngeal reflux    sometimes food goes down wrong way and she ends up in extreme coughing/choking scenario  . Personal history of radiation therapy 1991   left breast ca  . Primary osteoarthritis   . Thoracic aortic aneurysm (TAA) ([place])    Past Surgical History:  Procedure Laterality Date  . BREAST LUMPECTOMY Left 1991   breast ca, lumpectomy  . cataracts Bilateral   . CYSTOCELE REPAIR  2010  . EYE SURGERY Bilateral 2017   cataracts extracted  . JOINT REPLACEMENT    . MASTECTOMY  Left 07/25/2018   Invasive Mammary Carcinoma  . MASTECTOMY W/ SENTINEL NODE BIOPSY Left 07/25/2018   Procedure: MASTECTOMY WITH SENTINEL LYMPH NODE BIOPSY;  Surgeon: Olean Ree, MD;  Location: [facility] ORS;  Service: General;  Laterality: Left;  . OOPHORECTOMY  1995   ovarian mass - benign  . REPLACEMENT TOTAL KNEE Bilateral 2012,2013  . SKIN BIOPSY Left    arm growth  . TONSILLECTOMY  1958  . TOTAL SHOULDER ARTHROPLASTY Right 2016   Family History  Problem Relation Age of Onset  . Hypertension Mother   . Alzheimer's disease Mother   . Stroke Father   . Cancer  Brother        prostate  . Atrial fibrillation Brother   . Cancer Other        breast  . Atrial fibrillation Brother   . Breast cancer Cousin 72       paternal x 2   Social History   Socioeconomic History  . Marital status: Single    Spouse name: Not on file  . Number of children: 0  . Years of education: Not on file  . Highest education level: Master's degree (e.g., MA, MS, MEng, MEd, MSW, MBA)  Occupational History  . 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 Smoking status: Never Smoker  . Smokeless tobacco: Never Used  . Tobacco comment: smoking cessation materials not required  Vaping Use  . Vaping Use: Never used  Substance and Sexual Activity  . Alcohol use: Yes    Alcohol/week: 2.0 standard drinks    Types: 1 Cans of beer, 1 Glasses of wine per week    Comment: occasional  . Drug use: Never  . Sexual activity: Not Currently  Other Topics Concern  . Not on file  Social History Narrative  . Not on file   Social Determinants of Health   Financial Resource Strain: Low Risk   . Difficulty of Paying Living Expenses: Not hard at all  Food Insecurity: No Food Insecurity  . Worried About Charity fundraiser in the Last Year: Never true  . Ran Out of Food in the Last Year: Never true  Transportation Needs: No Transportation Needs  . Lack of Transportation (Medical): No  . Lack of Transportation (Non-Medical): No  Physical Activity: Sufficiently Active  . Days of Exercise per Week: 7 days  . Minutes of Exercise per Session: 40 min  Stress: No Stress Concern Present  . Feeling of Stress : Not at all  Social Connections: Moderately Isolated  . Frequency of Communication with Friends and Family: More than three times a week  . Frequency of Social Gatherings with Friends and Family: More than three times a week  . Attends Religious Services: Never  . Active Member of Clubs or Organizations: Yes  . Attends Archivist  Meetings: Never  . Marital Status: Never married    Outpatient Encounter Medications as of 11/27/2019  Medication Sig  . Calcium Carbonate-Vitamin D (CALCIUM-VITAMIN D) 600-125 MG-UNIT TABS Take 1 tablet by mouth 2 (two) times a day.   . loratadine (CLARITIN REDITABS) 10 MG dissolvable tablet Take 10 mg by mouth at bedtime.  . montelukast (SINGULAIR) 10 MG tablet TAKE 1 TABLET BY MOUTH EVERYDAY AT BEDTIME  . Multiple Vitamins-Minerals ([place] 50+) CAPS   . omeprazole (PRILOSEC) 40 MG capsule TAKE 1 CAPSULE BY MOUTH EVERY DAY  . [DISCONTINUED] ibuprofen (ADVIL) 800 MG tablet Take 800 mg  by mouth every 8 (eight) hours as needed.   No facility-administered encounter medications on file as of 11/27/2019.    Activities of Daily Living In your present state of health, do you have any difficulty performing the following activities: 11/27/2019  Hearing? Y  Comment declines hearing aids  Vision? N  Difficulty concentrating or making decisions? N  Walking or climbing stairs? Y  Dressing or bathing? N  Doing errands, shopping? N  Preparing Food and eating ? N  Using the Toilet? N  In the past six months, have you accidently leaked urine? N  Do you have problems with loss of bowel control? N  Managing your Medications? N  Managing your Finances? N  Housekeeping or managing your Housekeeping? N  Some recent data might be hidden    Patient Care Team: Glean Hess, MD as PCP - General (Internal Medicine) Marlaine Hind, MD as Consulting Physician (Physical Medicine and Rehabilitation) Eustace Moore, MD as Consulting Physician (Neurosurgery) Tyler Pita, MD as Consulting Physician (Pulmonary Disease) Olean Ree, MD as Consulting Physician (General Surgery)    Assessment:   This is a routine wellness examination for Karol.  Exercise Activities and Dietary recommendations Current Exercise Habits: Home exercise routine, Type of exercise: Other - see comments (exercise  bike), Time (Minutes): 40, Frequency (Times/Week): 7, Weekly Exercise (Minutes/Week): 280, Intensity: Moderate, Exercise limited by: orthopedic condition(s)  Goals    . DIET - INCREASE WATER INTAKE     Recommend to drink at least 6-8 8oz glasses of water per day.    . Patient Stated     Continue to lose weight        Fall Risk Fall Risk  11/27/2019 08/23/2019 11/28/2018 11/21/2018 08/31/2018  Falls in the past year? 1 1 0 0 0  Number falls in past yr: 0 0 0 0 0  Injury with Fall? 1 1 0 0 0  Risk for fall due to : History of fall(s);Impaired balance/gait;Impaired vision;Impaired mobility - - Impaired balance/gait -  Risk for fall due to: Comment - - - - -  Follow up Falls prevention discussed - Falls evaluation completed Falls prevention discussed Falls evaluation completed   FALL RISK PREVENTION PERTAINING TO THE HOME:  Any stairs in or around the home? Yes  If so, are there any without handrails? Yes   Home free of loose throw rugs in walkways, pet beds, electrical cords, etc? Yes  Adequate lighting in your home to reduce risk of falls? Yes   ASSISTIVE DEVICES UTILIZED TO PREVENT FALLS:  Life alert? No  Use of a cane, walker or w/c? Yes  Grab bars in the bathroom? No  Shower chair or bench in shower? Yes  Elevated toilet seat or a handicapped toilet? No   DME ORDERS:  DME order needed?  No   TIMED UP AND GO:  Was the test performed? No .     Education: Fall risk prevention has been discussed.  Intervention(s) required? Yes    Depression Screen PHQ 2/9 Scores 11/27/2019 11/28/2018 11/21/2018 08/31/2018  PHQ - 2 Score 2 1 0 0  PHQ- 9 Score - - - -     Cognitive Function     6CIT Screen 11/27/2019 11/21/2018 11/17/2017  What Year? 0 points 0 points 0 points  What month? 0 points 0 points 0 points  What time? 0 points 0 points 0 points  Count back from 20 0 points 0 points 0 points  Months in reverse 0 points  0 points 0 points  Repeat phrase 0 points 0 points 0 points   Total Score 0 0 0    Immunization History  Administered Date(s) Administered  . Influenza Split 06/28/2012  . Influenza, High Dose Seasonal PF 03/14/2018  . Pneumococcal Conjugate-13 07/04/2014  . Pneumococcal Polysaccharide-23 06/16/2015  . Tdap 03/11/2012  . Zoster 02/25/2016    Qualifies for Shingles Vaccine? Yes  Zostavax completed 2017. Due for Shingrix. Education has been provided regarding the importance of this vaccine. Pt has been advised to call insurance company to determine out of pocket expense. Advised may also receive vaccine at local pharmacy or Health Dept. Verbalized acceptance and understanding.  Tdap: Up to date  Flu Vaccine: Due for Flu vaccine later this year.   Pneumococcal Vaccine: Up to date  Covid-19 Vaccine: Up to date per patient. Pt advised to bring vaccination record to next appt.   Screening Tests Health Maintenance  Topic Date Due  . COVID-19 Vaccine (1) Never done  . MAMMOGRAM  06/30/2019  . INFLUENZA VACCINE  01/14/2020  . TETANUS/TDAP  03/11/2022  . DEXA SCAN  Completed  . PNA vac Low Risk Adult  Completed    Cancer Screenings:  Colorectal Screening:  No longer required.   Mammogram:  Ultrasound completed 03/2019. Repeat every year. Ordered by Dr. Hampton Abbot. Pt provided with contact information and advised to call to schedule appt.   Bone Density: Completed 11/29/17. Results reflect  OSTEOPENIA. Repeat every 2 years. Pt would like to discuss with Dr. Army Melia at next appt   Lung Cancer Screening: (Low Dose CT Chest recommended if Age 73-80 years, 30 pack-year currently smoking OR have quit w/in 15years.) does not qualify.    Additional Screening:  Hepatitis C Screening: no longer required  Vision Screening: Recommended annual ophthalmology exams for early detection of glaucoma and other disorders of the eye. Is the patient up to date with their annual eye exam?  No - appt scheduled for 01/2020 Who is the provider or what is the name  of the office in which the pt attends annual eye exams? [place] Screening: Recommended annual dental exams for proper oral hygiene  Community Resource Referral:  CRR required this visit?  No      Plan:     I have personally reviewed and addressed the Medicare Annual Wellness questionnaire and have noted the following in the patient's chart:  A. Medical and social history B. Use of alcohol, tobacco or illicit drugs  C. Current medications and supplements D. Functional ability and status E.  Nutritional status F.  Physical activity G. Advance directives H. List of other physicians I.  Hospitalizations, surgeries, and ER visits in previous 12 months J.  [place] such as hearing and vision if needed, cognitive and depression L. Referrals and appointments   In addition, I have reviewed and discussed with patient certain preventive protocols, quality metrics, and best practice recommendations. A written personalized care plan for preventive services as well as general preventive health recommendations were provided to patient.   Signed,  Connie Marker, LPN Nurse Health Advisor   Nurse Notes:

## 2019-12-01 ENCOUNTER — Other Ambulatory Visit: Payer: Self-pay

## 2019-12-01 DIAGNOSIS — Z1231 Encounter for screening mammogram for malignant neoplasm of breast: Secondary | ICD-10-CM

## 2019-12-12 ENCOUNTER — Ambulatory Visit
Admission: RE | Admit: 2019-12-12 | Discharge: 2019-12-12 | Disposition: A | Discharge status: Home / Self Care | Payer: Medicare Other | Attending: Internal Medicine | Admitting: Internal Medicine

## 2019-12-12 ENCOUNTER — Ambulatory Visit
Admission: RE | Admit: 2019-12-12 | Discharge: 2019-12-12 | Disposition: A | Discharge status: Home / Self Care | Payer: Medicare Other | Source: Ambulatory Visit | Attending: Internal Medicine | Admitting: Internal Medicine

## 2019-12-12 ENCOUNTER — Encounter: Payer: Self-pay | Admitting: Internal Medicine

## 2019-12-12 ENCOUNTER — Other Ambulatory Visit
Admission: RE | Admit: 2019-12-12 | Discharge: 2019-12-12 | Disposition: A | Discharge status: Home / Self Care | Payer: Medicare Other | Source: Home / Self Care | Attending: Internal Medicine | Admitting: Internal Medicine

## 2019-12-12 ENCOUNTER — Other Ambulatory Visit: Payer: Self-pay

## 2019-12-12 ENCOUNTER — Ambulatory Visit (INDEPENDENT_AMBULATORY_CARE_PROVIDER_SITE_OTHER): Payer: Medicare Other | Admitting: Internal Medicine

## 2019-12-12 VITALS — BP 118/60 | HR 97 | Temp 98.6°F | Ht 64.0 in | Wt 169.5 lb

## 2019-12-12 DIAGNOSIS — Z0000 Encounter for general adult medical examination without abnormal findings: Secondary | ICD-10-CM

## 2019-12-12 DIAGNOSIS — K219 Gastro-esophageal reflux disease without esophagitis: Secondary | ICD-10-CM | POA: Insufficient documentation

## 2019-12-12 DIAGNOSIS — I7121 Aneurysm of the ascending aorta, without rupture: Secondary | ICD-10-CM

## 2019-12-12 DIAGNOSIS — I700 Atherosclerosis of aorta: Secondary | ICD-10-CM | POA: Insufficient documentation

## 2019-12-12 DIAGNOSIS — M25522 Pain in left elbow: Secondary | ICD-10-CM | POA: Diagnosis present

## 2019-12-12 DIAGNOSIS — M810 Age-related osteoporosis without current pathological fracture: Secondary | ICD-10-CM | POA: Diagnosis not present

## 2019-12-12 DIAGNOSIS — M62838 Other muscle spasm: Secondary | ICD-10-CM | POA: Diagnosis present

## 2019-12-12 DIAGNOSIS — C50412 Malignant neoplasm of upper-outer quadrant of left female breast: Secondary | ICD-10-CM | POA: Diagnosis not present

## 2019-12-12 DIAGNOSIS — D485 Neoplasm of uncertain behavior of skin: Secondary | ICD-10-CM

## 2019-12-12 DIAGNOSIS — G8929 Other chronic pain: Secondary | ICD-10-CM | POA: Insufficient documentation

## 2019-12-12 DIAGNOSIS — I712 Thoracic aortic aneurysm, without rupture: Secondary | ICD-10-CM

## 2019-12-12 DIAGNOSIS — Z170 Estrogen receptor positive status [ER+]: Secondary | ICD-10-CM

## 2019-12-12 LAB — CBC WITH DIFFERENTIAL/PLATELET
Abs Immature Granulocytes: 0.02 10*3/uL (ref 0.00–0.07)
Basophils Absolute: 0.1 10*3/uL (ref 0.0–0.1)
Basophils Relative: 1 %
Eosinophils Absolute: 0.2 10*3/uL (ref 0.0–0.5)
Eosinophils Relative: 2 %
HCT: 41.6 % (ref 36.0–46.0)
Hemoglobin: 14.4 g/dL (ref 12.0–15.0)
Immature Granulocytes: 0 %
Lymphocytes Relative: 23 %
Lymphs Abs: 1.8 10*3/uL (ref 0.7–4.0)
MCH: 31.6 pg (ref 26.0–34.0)
MCHC: 34.6 g/dL (ref 30.0–36.0)
MCV: 91.2 fL (ref 80.0–100.0)
Monocytes Absolute: 0.6 10*3/uL (ref 0.1–1.0)
Monocytes Relative: 7 %
Neutro Abs: 5.3 10*3/uL (ref 1.7–7.7)
Neutrophils Relative %: 67 %
RBC: 4.56 MIL/uL (ref 3.87–5.11)
RDW: 15.2 % (ref 11.5–15.5)
WBC: 7.9 10*3/uL (ref 4.0–10.5)
nRBC: 0 % (ref 0.0–0.2)

## 2019-12-12 LAB — LIPID PANEL
Cholesterol: 174 mg/dL (ref 0–200)
HDL: 56 mg/dL (ref >40)
LDL Cholesterol: 101 mg/dL — ABNORMAL HIGH (ref 0–99)
Total CHOL/HDL Ratio: 3.1 RATIO
Triglycerides: 87 mg/dL (ref <150)
VLDL: 17 mg/dL (ref 0–40)

## 2019-12-12 LAB — COMPREHENSIVE METABOLIC PANEL
ALT: 21 U/L (ref 0–44)
AST: 20 U/L (ref 15–41)
Albumin: 4 g/dL (ref 3.5–5.0)
Alkaline Phosphatase: 63 U/L (ref 38–126)
Anion gap: 5 (ref 5–15)
BUN: 19 mg/dL (ref 8–23)
CO2: 27 mmol/L (ref 22–32)
Calcium: 8.9 mg/dL (ref 8.9–10.3)
Chloride: 107 mmol/L (ref 98–111)
Creatinine, Ser: 0.66 mg/dL (ref 0.44–1.00)
GFR calc Af Amer: >60 mL/min (ref >60)
GFR calc non Af Amer: >60 mL/min (ref >60)
Glucose, Bld: 89 mg/dL (ref 70–99)
Potassium: 4.5 mmol/L (ref 3.5–5.1)
Sodium: 139 mmol/L (ref 135–145)
Total Bilirubin: 0.8 mg/dL (ref 0.3–1.2)
Total Protein: 6.7 g/dL (ref 6.5–8.1)

## 2019-12-12 MED ORDER — BACLOFEN 10 MG PO TABS: 10.0000 mg | ORAL_TABLET | Freq: Every day | ORAL | 0 refills | Status: DC

## 2019-12-12 NOTE — Progress Notes (Signed)
Date:  12/12/2019   Name:  Connie Mitchell   DOB:  September 19, 1933   MRN:  [id number]   Chief Complaint: Annual Exam (Medicare Yearly. Breast Exam. )  Connie Mitchell is a 86 y.o. female who presents today for her Complete Annual Exam. She feels fairly well. Most of her complaints are related to orthopedic issues.  She reports exercising - 7.5 miles on exercise bike daily. She is unable to walk due to back pain.  She reports she is sleeping fairly well.   Mammogram: scheduled 03/2020 Pap smear: discontinued Colonoscopy: aged out  Immunization History  Administered Date(s) Administered  . Influenza Split 06/28/2012  . Influenza, High Dose Seasonal PF 03/14/2018  . PFIZER SARS-COV-2 Vaccination 08/04/2019, 08/25/2019  . Pneumococcal Conjugate-13 07/04/2014  . Pneumococcal Polysaccharide-23 06/16/2015  . Tdap 03/11/2012  . Zoster 02/25/2016    Gastroesophageal Reflux She complains of globus sensation and wheezing. She reports no abdominal pain, no chest pain or no coughing. This is a chronic problem. The problem occurs occasionally. Pertinent negatives include no fatigue. She has tried a PPI (for LPR) for the symptoms. The treatment provided significant relief.  Breast cancer - recurrent on left in 2020.  S/p lumpectomy and XRT; declined chemo and endocrine therapy.  Being followed by Gen surgery and Oncology. Osteoporosis - she is taking calcium and vitamin d.  She is not interested in bisphosphonate or other treatment. Aneurysm - last scanned in 11/2018 showing 4.5 cm and unchanged.  Recommendations for imaging semi-annually.  Has follow up with VS in August. IMPRESSION: 1. Unchanged ascending thoracic aortic aneurysm measuring up to 4.5 cm. Recommend semi-annual imaging followup by CTA or MRA and referral to cardiothoracic surgery if not already obtained. Elbow problem -  On left, decreased ROM is significantly worse.  More pain and weakness plus less motion. Neck pain - ongoing but never  evaluated.  She denies weakness of hands or arms. No tingling but occasional shooting pains.  Some headaches that radiate from the neck to the temple.  No dizziness or syncope.  Lab Results  Component Value Date   CREATININE 0.69 04/05/2019   BUN 21 04/05/2019   NA 137 04/05/2019   K 4.1 04/05/2019   CL 102 04/05/2019   CO2 26 04/05/2019   Lab Results  Component Value Date   CHOL 181 05/27/2018   HDL 66 05/27/2018   LDLCALC 98 05/27/2018   TRIG 86 05/27/2018   CHOLHDL 2.7 05/27/2018   No results found for: TSH No results found for: HGBA1C Lab Results  Component Value Date   WBC 6.5 04/05/2019   HGB 14.6 04/05/2019   HCT 43.1 04/05/2019   MCV 91.1 04/05/2019   PLT  04/05/2019    PLATELET CLUMPING, SUGGEST RECOLLECTION OF SAMPLE IN CITRATE TUBE.   Lab Results  Component Value Date   ALT 19 04/05/2019   AST 19 04/05/2019   ALKPHOS 69 04/05/2019   BILITOT 0.2 (L) 04/05/2019     Review of Systems  Constitutional: Negative for chills, fatigue, fever and unexpected weight change.  HENT: Positive for tinnitus. Negative for hearing loss, sinus pressure and sinus pain.   Eyes: Negative for visual disturbance.  Respiratory: Positive for wheezing. Negative for cough and shortness of breath.   Cardiovascular: Negative for chest pain, palpitations and leg swelling.  Gastrointestinal: Negative for abdominal pain, diarrhea and vomiting.  Endocrine: Negative for polyuria.  Genitourinary: Negative for dysuria, hematuria, vaginal bleeding, vaginal discharge and vaginal pain.  Musculoskeletal: Positive for  arthralgias (elbow issue), back pain, gait problem and neck stiffness. Negative for joint swelling and myalgias.  Skin: Negative for rash.  Allergic/Immunologic: Positive for environmental allergies.  Neurological: Positive for headaches (Getting worse in the last year- stress.). Negative for dizziness, syncope, weakness, light-headedness and numbness.  Hematological: Negative for  adenopathy.  Psychiatric/Behavioral: Negative for dysphoric mood and sleep disturbance. The patient is nervous/anxious.     Patient Active Problem List   Diagnosis Date Noted  . Aortic atherosclerosis ([place]) 12/12/2019  . Goals of care, counseling/discussion 04/05/2019  . Axillary adenopathy 01/01/2019  . Pulsatile abdomen 12/30/2018  . 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 Osteopenia of neck of right femur 07/12/2018  . Malignant neoplasm of upper-outer quadrant of left female breast ([place]) 07/07/2018  . Thoracic ascending aortic aneurysm ([place]) 05/27/2018  . LPRD (laryngopharyngeal reflux disease) 03/17/2018  . Post-nasal drip 03/17/2018  . Allergic cough 11/25/2017  . Chronic foot pain 11/25/2017  . Lumbar degenerative disc disease 09/24/2017  . Hx of Lyme disease 09/24/2017  . Hx of basal cell carcinoma 09/24/2017  . Elevated blood pressure reading 09/24/2017  . Nuclear sclerotic cataract of left eye 08/09/2015  . Osteoporosis 07/04/2014  . Primary localized osteoarthrosis of shoulder region 12/13/2012  . Osteoarthritis 12/18/2011    Allergies  Allergen Reactions  . Ciprofloxacin Nausea And Vomiting    Past Surgical History:  Procedure Laterality Date  . BREAST LUMPECTOMY Left 1991   breast ca, lumpectomy  . cataracts Bilateral   . CYSTOCELE REPAIR  2010  . EYE SURGERY Bilateral 2017   cataracts extracted  . JOINT REPLACEMENT    . MASTECTOMY Left 07/25/2018   Invasive Mammary Carcinoma  . MASTECTOMY W/ SENTINEL NODE BIOPSY Left 07/25/2018   Procedure: MASTECTOMY WITH SENTINEL LYMPH NODE BIOPSY;  Surgeon: Olean Ree, MD;  Location: [facility] ORS;  Service: General;  Laterality: Left;  . OOPHORECTOMY  1995   ovarian mass - benign  . REPLACEMENT TOTAL KNEE Bilateral 2012,2013  . SKIN BIOPSY Left    arm growth  . TONSILLECTOMY  1958  . TOTAL SHOULDER ARTHROPLASTY Right 2016    Social History   Tobacco Use  . Smoking status: Never Smoker  . Smokeless tobacco:  Never Used  . Tobacco comment: smoking cessation materials not required  Vaping Use  . Vaping Use: Never used  Substance Use Topics  . Alcohol use: Yes    Alcohol/week: 2.0 standard drinks    Types: 1 Cans of beer, 1 Glasses of wine per week    Comment: occasional  . Drug use: Never     Medication list has been reviewed and updated.  Current Meds  Medication Sig  . Calcium Carbonate-Vitamin D (CALCIUM-VITAMIN D) 600-125 MG-UNIT TABS Take 1 tablet by mouth 2 (two) times a day.   . loratadine (CLARITIN REDITABS) 10 MG dissolvable tablet Take 10 mg by mouth at bedtime.  . montelukast (SINGULAIR) 10 MG tablet TAKE 1 TABLET BY MOUTH EVERYDAY AT BEDTIME  . Multiple Vitamins-Minerals ([place] 50+) CAPS   . omeprazole (PRILOSEC) 40 MG capsule TAKE 1 CAPSULE BY MOUTH EVERY DAY    PHQ 2/9 Scores 12/12/2019 11/27/2019 11/28/2018 11/21/2018  PHQ - 2 Score 2 2 1  0  PHQ- 9 Score 2 - - -    GAD 7 : Generalized Anxiety Score 12/12/2019  Nervous, Anxious, on Edge 1  Control/stop worrying 1  Worry too much - different things 1  Trouble relaxing 0  Restless 0  Easily annoyed or irritable 0  Afraid - awful might happen 0  Total GAD 7 Score 3  Anxiety Difficulty Not difficult at all    BP Readings from Last 3 Encounters:  12/12/19 118/60  11/27/19 111/70  08/23/19 127/84    Physical Exam Vitals and nursing note reviewed.  Constitutional:      General: She is not in acute distress.    Appearance: She is well-developed.  HENT:     Head: Normocephalic and atraumatic.     Right Ear: Tympanic membrane and ear canal normal.     Left Ear: Tympanic membrane and ear canal normal.     Nose:     Right Sinus: No maxillary sinus tenderness.     Left Sinus: No maxillary sinus tenderness.  Eyes:     General: No scleral icterus.       Right eye: No discharge.        Left eye: No discharge.     Conjunctiva/sclera: Conjunctivae normal.  Neck:     Thyroid: No thyromegaly.     Vascular: No  carotid bruit.  Cardiovascular:     Rate and Rhythm: Normal rate and regular rhythm.     Pulses: Normal pulses.     Heart sounds: Normal heart sounds.  Pulmonary:     Effort: Pulmonary effort is normal. No respiratory distress.     Breath sounds: No wheezing.  Chest:     Breasts:        Right: No mass, nipple discharge, skin change or tenderness.        Left: Absent.  Abdominal:     General: Bowel sounds are normal.     Palpations: Abdomen is soft.     Tenderness: There is no abdominal tenderness.  Musculoskeletal:     Right shoulder: Decreased range of motion.     Left elbow: Deformity (bony enlargement) present. Decreased range of motion.     Cervical back: Spasms and tenderness present. No erythema. Decreased range of motion.     Right lower leg: No edema.     Left lower leg: No edema.  Lymphadenopathy:     Cervical: No cervical adenopathy.  Skin:    General: Skin is warm and dry.     Findings: No rash.       Neurological:     Mental Status: She is alert and oriented to person, place, and time.     Cranial Nerves: No cranial nerve deficit.     Sensory: No sensory deficit.     Deep Tendon Reflexes: Reflexes are normal and symmetric.  Psychiatric:        Attention and Perception: Attention normal.        Mood and Affect: Mood normal.     Wt Readings from Last 3 Encounters:  12/12/19 169 lb 8 oz (76.9 kg)  11/27/19 169 lb (76.7 kg)  08/23/19 175 lb (79.4 kg)    BP 118/60 (BP Location: Right Arm, Patient Position: Sitting, Cuff Size: Normal)   Pulse 97   Temp 98.6 F (37 C) (Oral)   Ht 5\' 4"  (1.626 m)   Wt 169 lb 8 oz (76.9 kg)   SpO2 99%   BMI 29.09 kg/m   Assessment and Plan: 1. LPRD (laryngopharyngeal reflux disease) Continue on daily PPI Pt does not feel that sx are much improved - CBC with Differential/Platelet  2. Annual physical exam Continue healthy diet, exercise as able Immunizations are up to date  3. Aortic atherosclerosis ([facility]) Pt would  likely benefit from statin  therapy but declines - Lipid panel  4. Malignant neoplasm of upper-outer quadrant of left breast in female, estrogen receptor positive ([place]) Followed by Gen Surgery Mammogram planned for October - CBC with Differential/Platelet - Comprehensive metabolic panel  5. Thoracic ascending aortic aneurysm [facility]) Being monitored yearly by Vascular surgery  6. Osteoporosis, unspecified osteoporosis type, unspecified pathological fracture presence Continue calcium and vitamin D daily - Comprehensive metabolic panel  7. Elbow pain, chronic, left Will get imaging and then refer if indicated - DG Elbow Complete Left; Future  8. Neoplasm of uncertain behavior of skin - Ambulatory referral to Dermatology  9. Neck muscle spasm Continue tylenol; heat or ice - DG Cervical Spine Complete; Future - baclofen (LIORESAL) 10 MG tablet; Take 1 tablet (10 mg total) by mouth at bedtime.  Dispense: 30 each; Refill: 0   Partially dictated using Editor, commissioning. Any errors are unintentional.  Halina Maidens, MD [facility]  12/12/2019

## 2019-12-15 ENCOUNTER — Other Ambulatory Visit: Payer: Self-pay

## 2019-12-15 ENCOUNTER — Telehealth: Payer: Self-pay | Admitting: *Deleted

## 2019-12-15 DIAGNOSIS — M19022 Primary osteoarthritis, left elbow: Secondary | ICD-10-CM

## 2019-12-15 NOTE — Telephone Encounter (Signed)
Pt called in and was given the messages from Dr. Army Melia:  12/14/2019 at 8:23 AM regarding her cervical spine 12/14/2019 at 8:25 AM regarding her elbow 12/15/2019 at 10:25 AM lab results given  In reference to the citrate test for clumping she does not want to repeat the blood work.  In reference to the referral to ortho - Yes she does want and referral.

## 2019-12-15 NOTE — Telephone Encounter (Signed)
Referral placed for ortho for left elbow.   CM

## 2019-12-18 IMAGING — MG DIGITAL DIAGNOSTIC BILATERAL MAMMOGRAM WITH TOMO AND CAD
8 series · 8 of 24 positions shown · non-contrast
Comparison: None.

Addendum:
CLINICAL DATA: History of left breast cancer status post lumpectomy
in 7227. Left breast mass seen on recent CT scan of the chest dated
05/10/2018.

EXAM:
DIGITAL DIAGNOSTIC BILATERAL MAMMOGRAM WITH CAD AND TOMO
ULTRASOUND LEFT BREAST

[L MLO synth-2D]
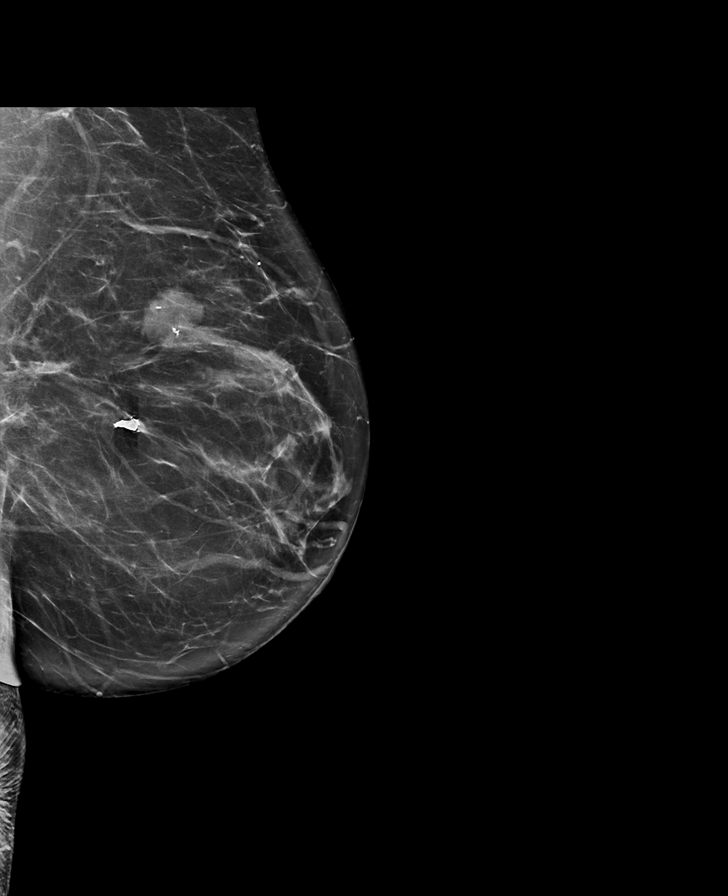

[R CC synth-2D]
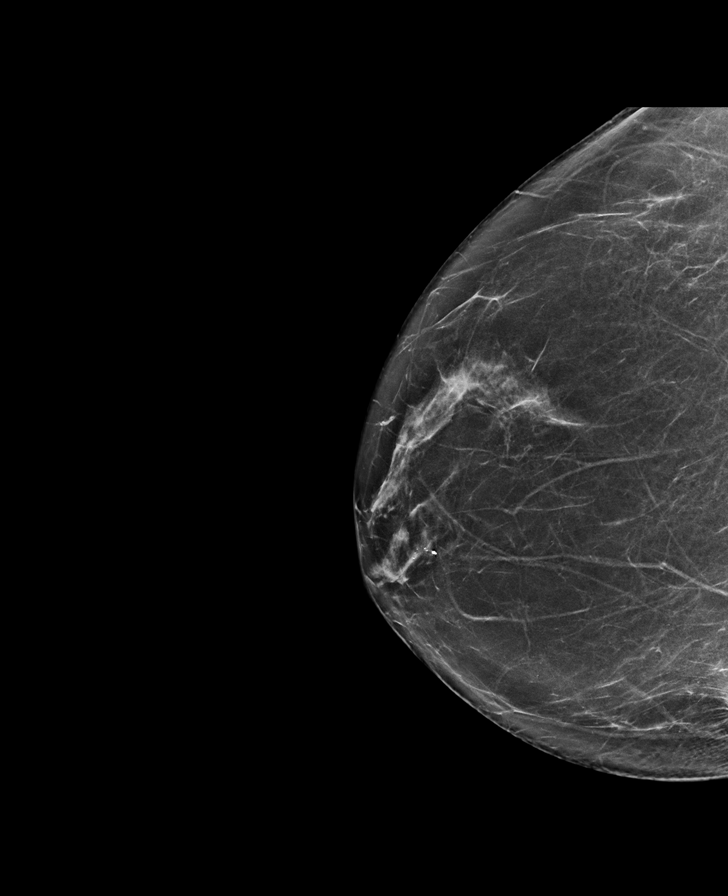

[R MLO synth-2D]
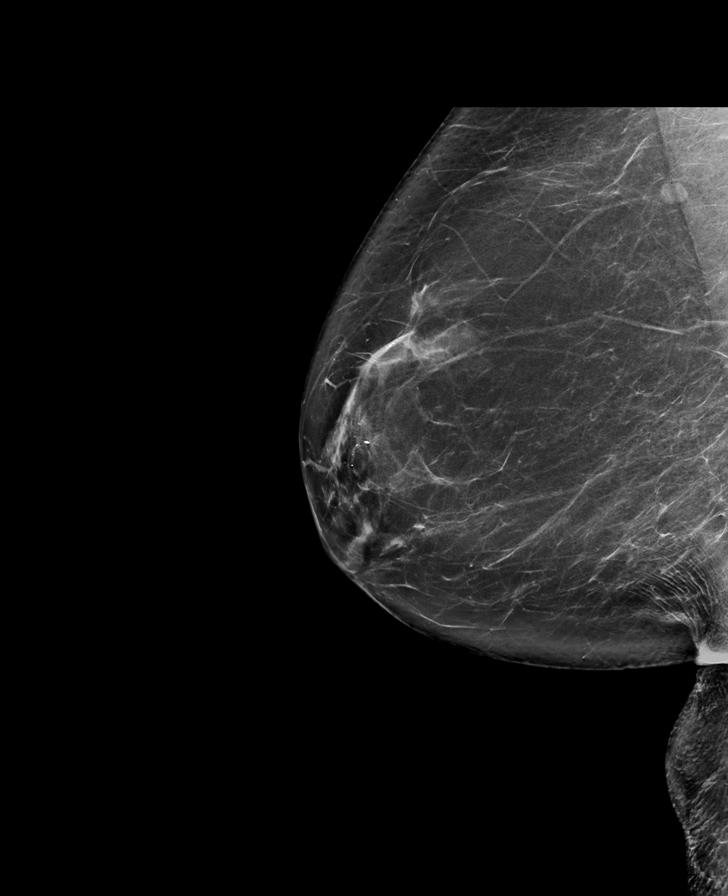

[L CC synth-2D]
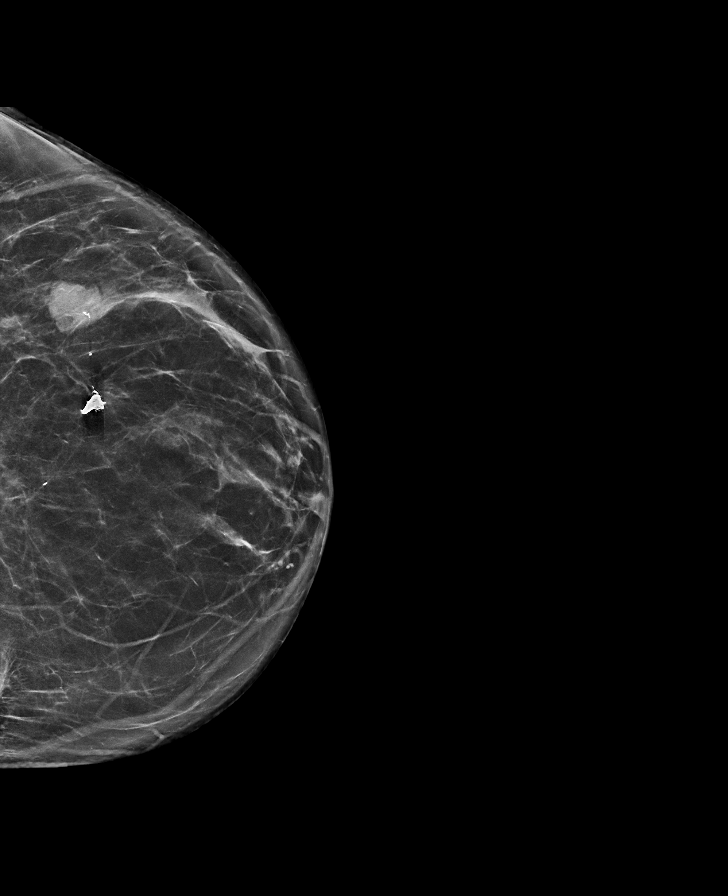

[L CC tomo · tomo slice 37/74.0]
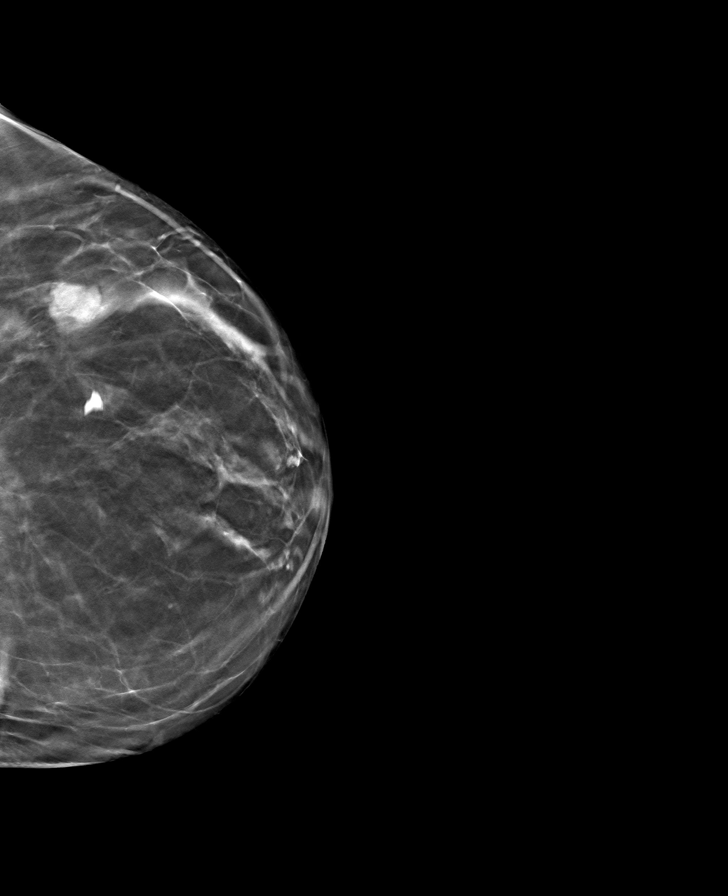

[R MLO tomo · tomo slice 41/80.0]
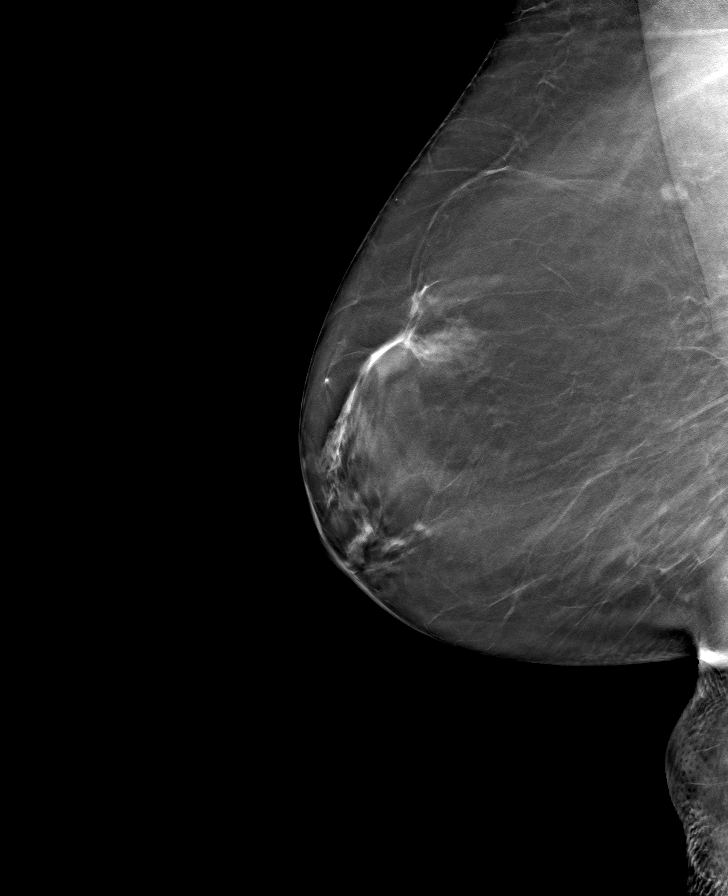

[L MLO tomo · tomo slice 40/79.0]
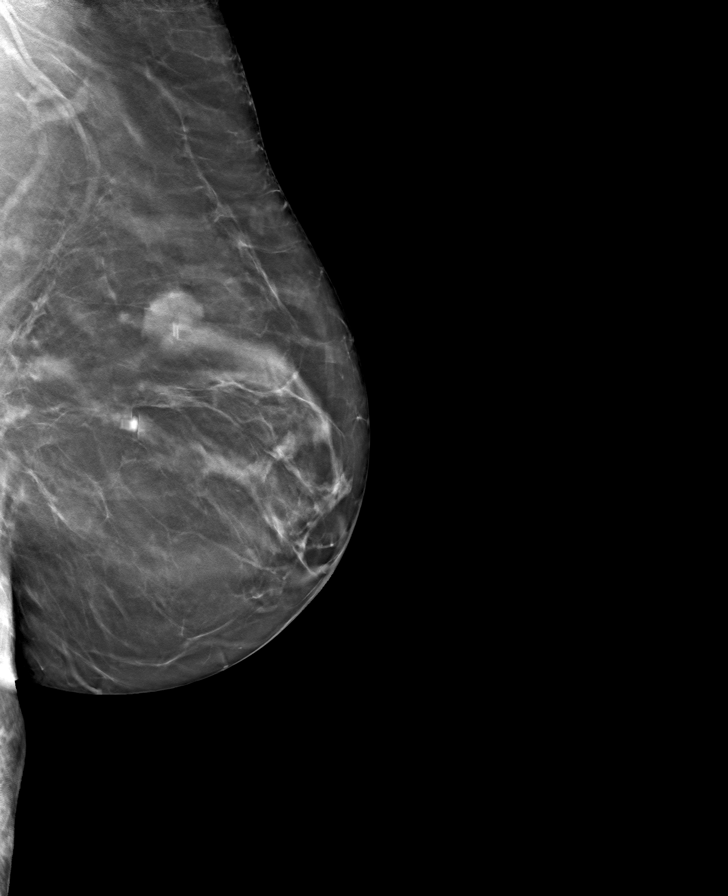

[R CC tomo · tomo slice 35/70.0]
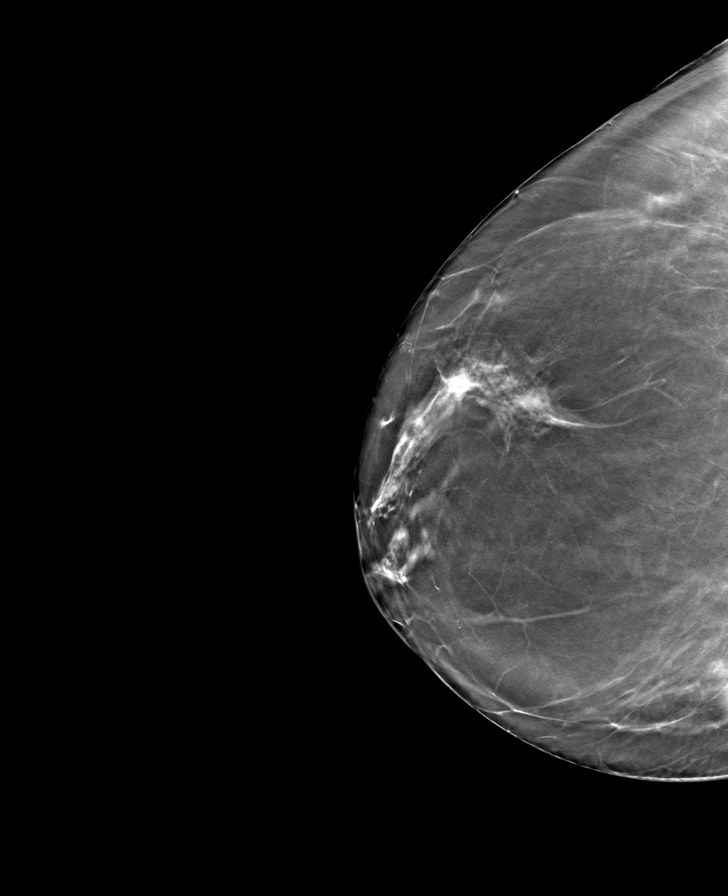

[8 of 24 positions shown; findings below may reference images not displayed]

ACR Breast Density Category b: There are scattered areas of
fibroglandular density.
FINDINGS: No suspicious mass or malignant type microcalcifications identified
in the right breast.

Lumpectomy changes are seen in the lateral aspect of the left
breast. There is a 1.7 cm lobulated mass in the upper-outer quadrant
of the left breast. Extending anterior to the mass is 6 cm of
asymmetric linear fibroglandular tissue. There is also asymmetric
irregular fibroglandular tissue posterior to the mass. This linear
asymmetric fibroglandular tissue extending anterior from the mass
and the asymmetric fibroglandular tissue posterior to the mass may
be secondary to changes from the prior lumpectomy but malignancy
could not be excluded. There are no malignant type
microcalcifications.

Mammographic images were processed with CAD.

On physical exam, there is firmness of the upper-outer quadrant of
the left breast with no palpable mass.

Targeted ultrasound is performed, showing there is an irregular
hypoechoic mass in left breast at 2 o'clock 7 cm from the nipple
measuring 1.8 x 1.7 x 1.0 cm. Sonographic evaluation of the left
axilla does not show any enlarged adenopathy.
IMPRESSION: Suspicious mass in the 2 o'clock region of the left breast.
Indeterminate asymmetric fibroglandular tissue anterior and
posterior to the suspicious mass.

RECOMMENDATION:
Ultrasound-guided core biopsy of the mass in the 2 o'clock region of
the left breast is recommended. If the mass is positive for
malignancy breast MRI is recommended to evaluate the areas of
asymmetric fibroglandular tissue in the left breast.

I have discussed the findings and recommendations with the patient.
Results were also provided in writing at the conclusion of the
visit. If applicable, a reminder letter will be sent to the patient
regarding the next appointment.

BI-RADS CATEGORY  4: Suspicious.

ADDENDUM:
Prior mammograms dated 05/27/2012 have become available for
comparison. The 1.7 cm lobulated mass in the upper-outer quadrant of
the left breast has developed since the prior exam. The asymmetric
tissue posterior and anterior to the mass is stable from the prior
exam and therefore felt to be benign.
IMPRESSION: Suspicious mass in the 2 o'clock region of the left breast.

Recommendation: Ultrasound-guided core biopsy of the mass in the 2
o'clock region of the left breast. Asymmetries anterior and
posterior to the mass are stable from prior exam and felt to be
benign.

BI-RADS category:  4: Suspicious.

*** End of Addendum ***

## 2019-12-20 IMAGING — MG MM CLIP PLACEMENT
2 series · 2 of 2 positions shown · non-contrast
Comparison: Previous exam(s).

CLINICAL DATA: Status post ultrasound-guided core biopsy of a left
breast mass.

EXAM:
DIAGNOSTIC LEFT MAMMOGRAM POST ULTRASOUND BIOPSY

[L ML]
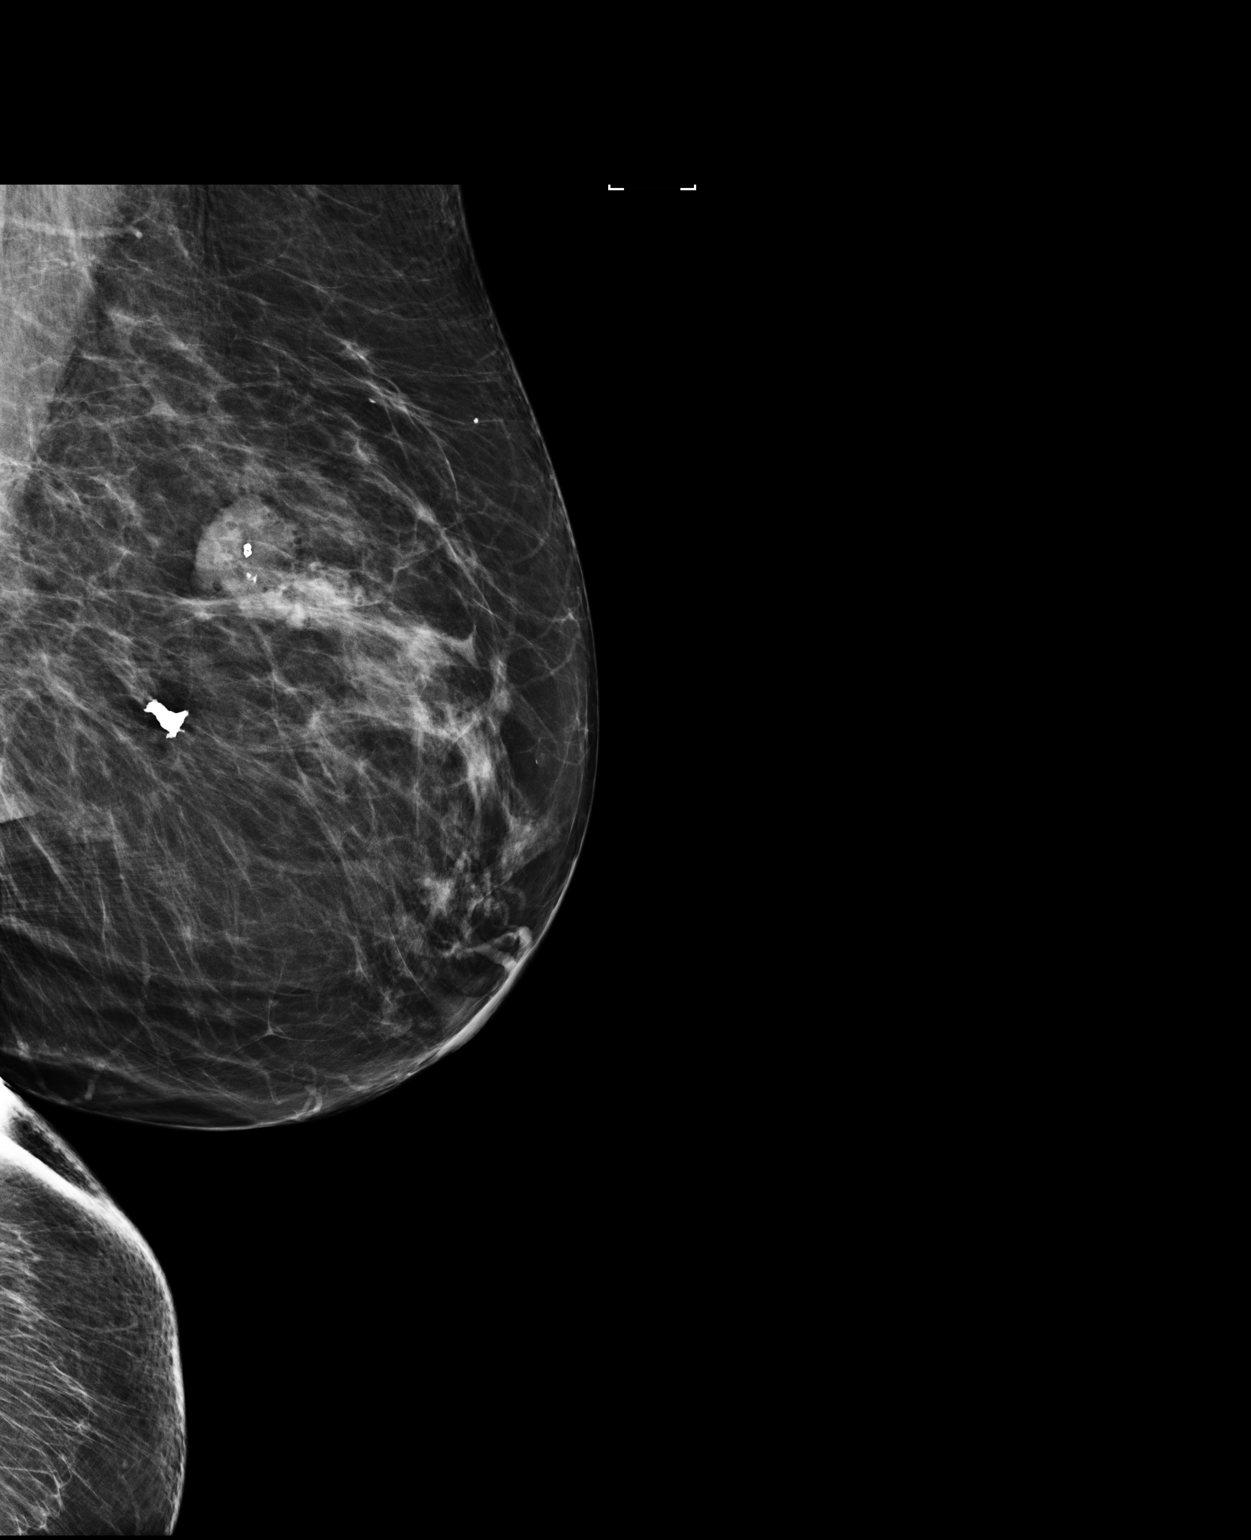

[L CC]
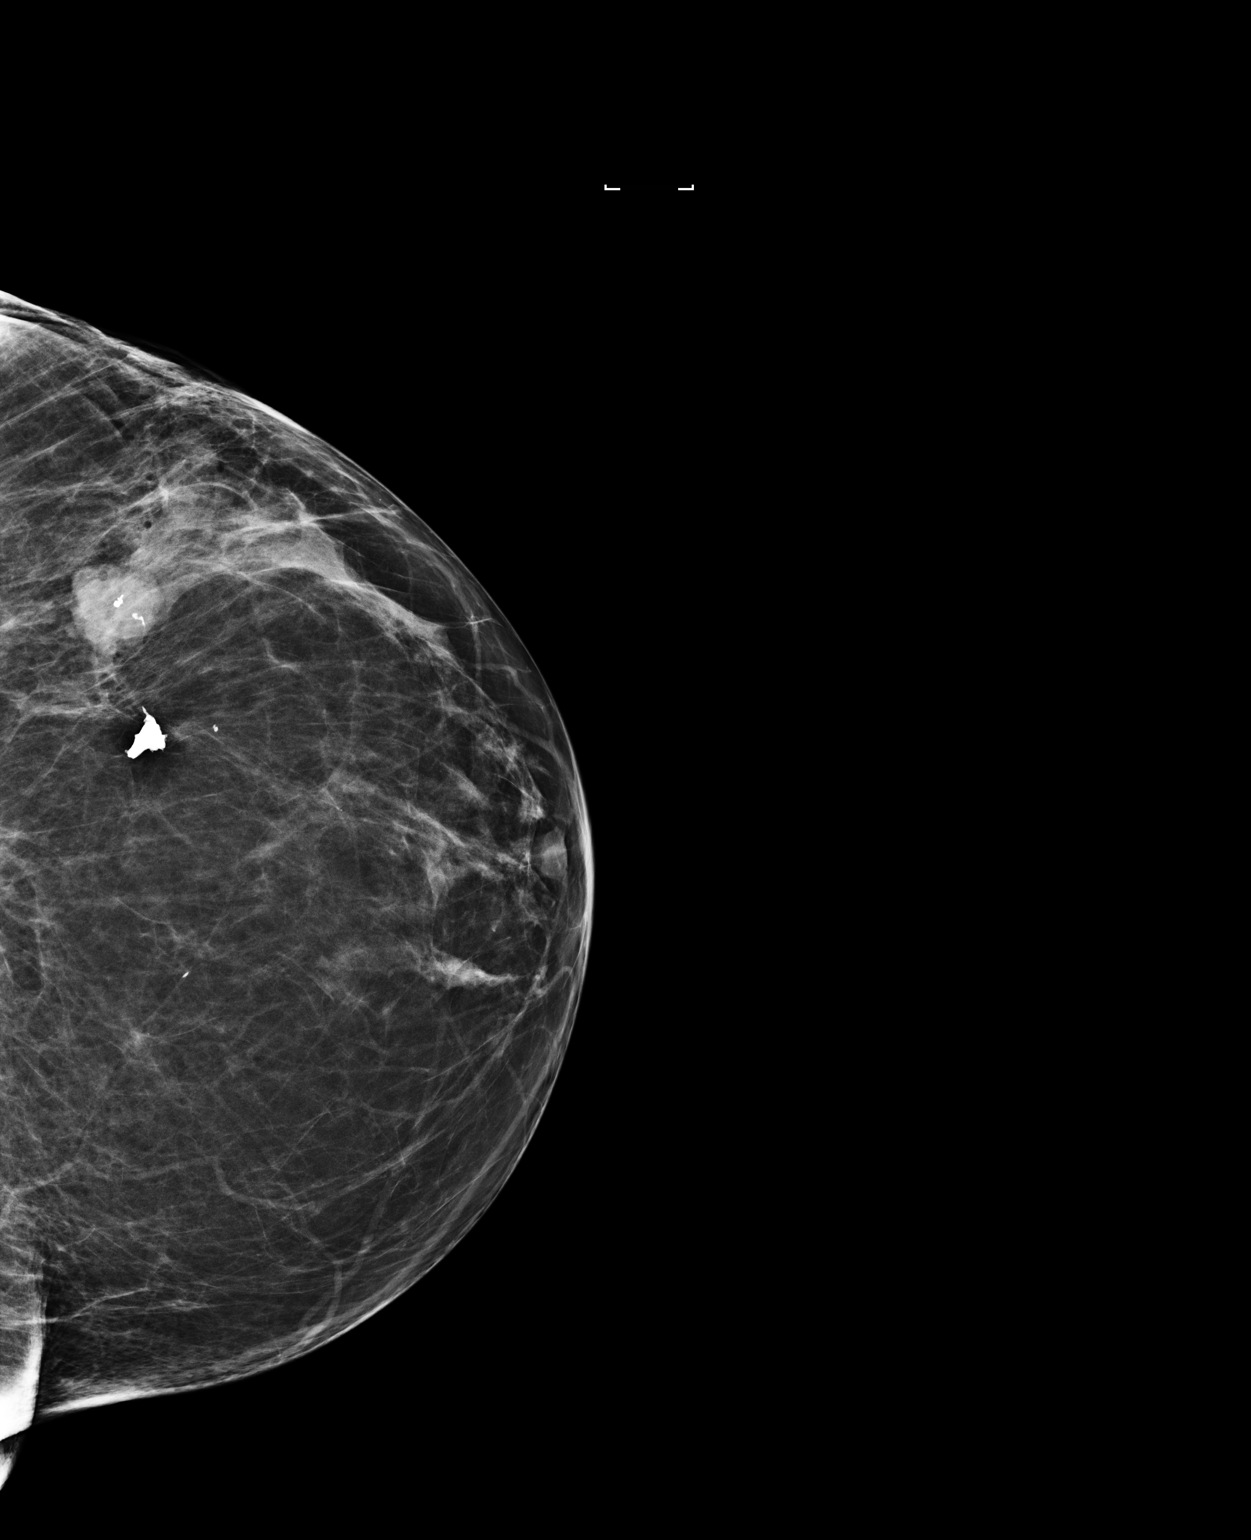

[2 of 2 positions shown; findings below may reference images not displayed]

FINDINGS: Mammographic images were obtained following ultrasound guided biopsy
of a mass in the 2 o'clock region of the left breast. Mammographic
images show there is a venous shaped clip in appropriate position in
the mass in the upper-outer quadrant of the left breast.
IMPRESSION: Status post ultrasound-guided core biopsy of a left breast mass with
pathology pending.

Final Assessment: Post Procedure Mammograms for Marker Placement

## 2019-12-22 ENCOUNTER — Other Ambulatory Visit: Payer: Self-pay | Admitting: Internal Medicine

## 2019-12-22 DIAGNOSIS — R058 Other specified cough: Secondary | ICD-10-CM

## 2019-12-28 NOTE — Addendum Note (Signed)
Addended by: Lesly Rubenstein on: 12/28/2019 01:58 PM   Modules accepted: Orders

## 2020-01-02 DIAGNOSIS — M25522 Pain in left elbow: Secondary | ICD-10-CM | POA: Diagnosis not present

## 2020-01-02 DIAGNOSIS — M19022 Primary osteoarthritis, left elbow: Secondary | ICD-10-CM | POA: Diagnosis not present

## 2020-01-04 ENCOUNTER — Other Ambulatory Visit: Payer: Self-pay | Admitting: Internal Medicine

## 2020-01-04 DIAGNOSIS — M62838 Other muscle spasm: Secondary | ICD-10-CM

## 2020-01-15 DIAGNOSIS — H40003 Preglaucoma, unspecified, bilateral: Secondary | ICD-10-CM | POA: Diagnosis not present

## 2020-01-16 ENCOUNTER — Other Ambulatory Visit (INDEPENDENT_AMBULATORY_CARE_PROVIDER_SITE_OTHER): Payer: Self-pay | Admitting: Nurse Practitioner

## 2020-01-16 DIAGNOSIS — I712 Thoracic aortic aneurysm, without rupture, unspecified: Secondary | ICD-10-CM

## 2020-01-17 DIAGNOSIS — Z872 Personal history of diseases of the skin and subcutaneous tissue: Secondary | ICD-10-CM | POA: Diagnosis not present

## 2020-01-17 DIAGNOSIS — L570 Actinic keratosis: Secondary | ICD-10-CM | POA: Diagnosis not present

## 2020-01-17 DIAGNOSIS — L578 Other skin changes due to chronic exposure to nonionizing radiation: Secondary | ICD-10-CM | POA: Diagnosis not present

## 2020-01-17 DIAGNOSIS — Z85828 Personal history of other malignant neoplasm of skin: Secondary | ICD-10-CM | POA: Diagnosis not present

## 2020-01-17 DIAGNOSIS — L565 Disseminated superficial actinic porokeratosis (DSAP): Secondary | ICD-10-CM | POA: Diagnosis not present

## 2020-01-17 DIAGNOSIS — D239 Other benign neoplasm of skin, unspecified: Secondary | ICD-10-CM | POA: Diagnosis not present

## 2020-01-17 DIAGNOSIS — L821 Other seborrheic keratosis: Secondary | ICD-10-CM | POA: Diagnosis not present

## 2020-01-18 ENCOUNTER — Ambulatory Visit
Admission: RE | Admit: 2020-01-18 | Discharge: 2020-01-18 | Disposition: A | Discharge status: Home / Self Care | Payer: Medicare Other | Source: Ambulatory Visit | Attending: Nurse Practitioner | Admitting: Nurse Practitioner

## 2020-01-18 ENCOUNTER — Other Ambulatory Visit
Admission: RE | Admit: 2020-01-18 | Discharge: 2020-01-18 | Disposition: A | Discharge status: Home / Self Care | Payer: Medicare Other | Source: Home / Self Care | Attending: Nurse Practitioner | Admitting: Nurse Practitioner

## 2020-01-18 ENCOUNTER — Other Ambulatory Visit: Payer: Self-pay | Admitting: Internal Medicine

## 2020-01-18 ENCOUNTER — Other Ambulatory Visit: Payer: Self-pay

## 2020-01-18 DIAGNOSIS — I712 Thoracic aortic aneurysm, without rupture, unspecified: Secondary | ICD-10-CM

## 2020-01-18 DIAGNOSIS — M62838 Other muscle spasm: Secondary | ICD-10-CM

## 2020-01-18 LAB — CREATININE, SERUM
Creatinine, Ser: 0.64 mg/dL (ref 0.44–1.00)
GFR calc Af Amer: >60 mL/min (ref >60)
GFR calc non Af Amer: >60 mL/min (ref >60)

## 2020-01-18 MED ORDER — IOHEXOL 350 MG/ML SOLN
100.0000 mL | Freq: Once | INTRAVENOUS | Status: AC | PRN
  Administered 2020-01-18: 75 mL via INTRAVENOUS

## 2020-01-18 NOTE — Telephone Encounter (Signed)
Pharmacy requesting changes to non delegated Rx

## 2020-01-18 NOTE — Telephone Encounter (Signed)
Please Advise. Last office visit 12/12/19.  KP

## 2020-01-30 ENCOUNTER — Ambulatory Visit
Admission: RE | Admit: 2020-01-30 | Discharge: 2020-01-30 | Disposition: A | Discharge status: Home / Self Care | Payer: Medicare Other | Source: Ambulatory Visit | Attending: Surgery | Admitting: Surgery

## 2020-01-30 ENCOUNTER — Other Ambulatory Visit: Payer: Self-pay

## 2020-01-30 DIAGNOSIS — Z1231 Encounter for screening mammogram for malignant neoplasm of breast: Secondary | ICD-10-CM | POA: Diagnosis not present

## 2020-01-31 ENCOUNTER — Other Ambulatory Visit: Payer: Self-pay | Admitting: Surgery

## 2020-01-31 DIAGNOSIS — R921 Mammographic calcification found on diagnostic imaging of breast: Secondary | ICD-10-CM

## 2020-01-31 DIAGNOSIS — R928 Other abnormal and inconclusive findings on diagnostic imaging of breast: Secondary | ICD-10-CM

## 2020-02-01 ENCOUNTER — Ambulatory Visit (INDEPENDENT_AMBULATORY_CARE_PROVIDER_SITE_OTHER): Payer: Medicare Other | Admitting: Vascular Surgery

## 2020-02-01 ENCOUNTER — Encounter (INDEPENDENT_AMBULATORY_CARE_PROVIDER_SITE_OTHER): Payer: Self-pay | Admitting: Vascular Surgery

## 2020-02-01 ENCOUNTER — Other Ambulatory Visit: Payer: Self-pay

## 2020-02-01 VITALS — BP 151/79 | HR 60 | Resp 16 | Wt 169.0 lb

## 2020-02-01 DIAGNOSIS — I724 Aneurysm of artery of lower extremity: Secondary | ICD-10-CM

## 2020-02-01 DIAGNOSIS — I712 Thoracic aortic aneurysm, without rupture: Secondary | ICD-10-CM

## 2020-02-01 DIAGNOSIS — I700 Atherosclerosis of aorta: Secondary | ICD-10-CM | POA: Diagnosis not present

## 2020-02-01 DIAGNOSIS — M8949 Other hypertrophic osteoarthropathy, multiple sites: Secondary | ICD-10-CM

## 2020-02-01 DIAGNOSIS — M159 Polyosteoarthritis, unspecified: Secondary | ICD-10-CM

## 2020-02-01 DIAGNOSIS — I7121 Aneurysm of the ascending aorta, without rupture: Secondary | ICD-10-CM

## 2020-02-02 ENCOUNTER — Encounter (INDEPENDENT_AMBULATORY_CARE_PROVIDER_SITE_OTHER): Payer: Self-pay | Admitting: Vascular Surgery

## 2020-02-02 NOTE — Progress Notes (Signed)
MRN : [id number]  Kaliya Shreiner is a 86 y.o. ([birth year]) female who presents with chief complaint of  Chief Complaint  Patient presents with  . Follow-up    48yr ct results  .  History of Present Illness:   The patient returns to the office for surveillance of a known ascending thoracic aortic aneurysm. Patient denies chest pain or back pain, no other abdominal complaints. No changes suggesting embolic episodes.   There have been no interval changes in the patient's overall health care since his last visit.  Patient denies amaurosis fugax or TIA symptoms. There is no history of claudication or rest pain symptoms of the lower extremities. The patient denies angina or shortness of breath.   CT angiogram of the thoracic aorta arteries shows an TAA measured 4.4 cm.  Current Meds  Medication Sig  . Calcium Carbonate-Vitamin D (CALCIUM-VITAMIN D) 600-125 MG-UNIT TABS Take 1 tablet by mouth 2 (two) times a day.   . loratadine (CLARITIN REDITABS) 10 MG dissolvable tablet Take 10 mg by mouth at bedtime.  . montelukast (SINGULAIR) 10 MG tablet TAKE 1 TABLET BY MOUTH EVERYDAY AT BEDTIME  . Multiple Vitamins-Minerals ([place] 50+) CAPS   . omeprazole (PRILOSEC) 40 MG capsule TAKE 1 CAPSULE BY MOUTH EVERY DAY    Past Medical History:  Diagnosis Date  . Cancer ([place]) 1991   left breast. treated with rads and lumpectomy  . Cancer ([place]) 2020   newly diagnosed, also left breast  . Cellulitis and abscess of toe of left foot 01/2018  . GERD (gastroesophageal reflux disease)   . Hx of therapeutic radiation 1991   left breast  . Laryngopharyngeal reflux    sometimes food goes down wrong way and she ends up in extreme coughing/choking scenario  . Personal history of radiation therapy 1991   left breast ca  . Primary osteoarthritis   . Thoracic aortic aneurysm (TAA) ([place])     Past Surgical History:  Procedure Laterality Date  . BREAST LUMPECTOMY Left 1991   breast ca, lumpectomy  .  cataracts Bilateral   . CYSTOCELE REPAIR  2010  . EYE SURGERY Bilateral 2017   cataracts extracted  . JOINT REPLACEMENT    . MASTECTOMY Left 07/25/2018   Invasive Mammary Carcinoma  . MASTECTOMY W/ SENTINEL NODE BIOPSY Left 07/25/2018   Procedure: MASTECTOMY WITH SENTINEL LYMPH NODE BIOPSY;  Surgeon: Olean Ree, MD;  Location: [facility] ORS;  Service: General;  Laterality: Left;  . OOPHORECTOMY  1995   ovarian mass - benign  . REPLACEMENT TOTAL KNEE Bilateral 2012,2013  . SKIN BIOPSY Left    arm growth  . TONSILLECTOMY  1958  . TOTAL SHOULDER ARTHROPLASTY Right 2016    Social History Social History   Tobacco Use  . Smoking status: Never Smoker  . Smokeless tobacco: Never Used  . Tobacco comment: smoking cessation materials not required  Vaping Use  . Vaping Use: Never used  Substance Use Topics  . Alcohol use: Yes    Alcohol/week: 2.0 standard drinks    Types: 1 Cans of beer, 1 Glasses of wine per week    Comment: occasional  . Drug use: Never    Family History Family History  Problem Relation Age of Onset  . Hypertension Mother   . Alzheimer's disease Mother   . Stroke Father   . Cancer Brother        prostate  . Atrial fibrillation Brother   . Cancer Other  breast  . Atrial fibrillation Brother   . Breast cancer Cousin 72       paternal x 2    Allergies  Allergen Reactions  . Ciprofloxacin Nausea And Vomiting     REVIEW OF SYSTEMS (Negative unless checked)  Constitutional: [] Weight loss  [] Fever  [] Chills Cardiac: [] Chest pain   [] Chest pressure   [] Palpitations   [] Shortness of breath when laying flat   [] Shortness of breath with exertion. Vascular:  [] Pain in legs with walking   [] Pain in legs at rest  [] History of DVT   [] Phlebitis   [] Swelling in legs   [] Varicose veins   [] Non-healing ulcers Pulmonary:   [] Uses home oxygen   [] Productive cough   [] Hemoptysis   [] Wheeze  [] COPD   [] Asthma Neurologic:  [] Dizziness   [] Seizures   [] History of  stroke   [] History of TIA  [] Aphasia   [] Vissual changes   [] Weakness or numbness in arm   [] Weakness or numbness in leg Musculoskeletal:   [] Joint swelling   [] Joint pain   [] Low back pain Hematologic:  [] Easy bruising  [] Easy bleeding   [] Hypercoagulable state   [] Anemic Gastrointestinal:  [] Diarrhea   [] Vomiting  [] Gastroesophageal reflux/heartburn   [] Difficulty swallowing. Genitourinary:  [] Chronic kidney disease   [] Difficult urination  [] Frequent urination   [] Blood in urine Skin:  [] Rashes   [] Ulcers  Psychological:  [] History of anxiety   []  History of major depression.  Physical Examination  Vitals:   02/01/20 0942  BP: (!) 151/79  Pulse: 60  Resp: 16  Weight: 169 lb (76.7 kg)   Body mass index is 29.01 kg/m. Gen: WD/WN, NAD Head: [place]/AT, No temporalis wasting.  Ear/Nose/Throat: Hearing grossly intact, nares w/o erythema or drainage Eyes: PER, EOMI, sclera nonicteric.  Neck: Supple, no large masses.   Pulmonary:  Good air movement, no audible wheezing bilaterally, no use of accessory muscles.  Cardiac: RRR, no JVD Vascular: Vessel Right Left  Radial Palpable Palpable  Gastrointestinal: Non-distended. No guarding/no peritoneal signs.  Musculoskeletal: M/S 5/5 throughout.  No deformity or atrophy.  Neurologic: CN 2-12 intact. Symmetrical.  Speech is fluent. Motor exam as listed above. Psychiatric: Judgment intact, Mood & affect appropriate for pt's clinical situation. Dermatologic: No rashes or ulcers noted.  No changes consistent with cellulitis.   CBC Lab Results  Component Value Date   WBC 7.9 12/12/2019   HGB 14.4 12/12/2019   HCT 41.6 12/12/2019   MCV 91.2 12/12/2019   PLT  12/12/2019    PLATELET CLUMPING, SUGGEST RECOLLECTION OF SAMPLE IN CITRATE TUBE.    BMET    Component Value Date/Time   NA 139 12/12/2019 1020   NA 144 05/27/2018 1051   K 4.5 12/12/2019 1020   CL 107 12/12/2019 1020   CO2 27 12/12/2019 1020   GLUCOSE 89 12/12/2019 1020   BUN 19  12/12/2019 1020   BUN 13 05/27/2018 1051   CREATININE 0.64 01/18/2020 0905   CALCIUM 8.9 12/12/2019 1020   GFRNONAA >60 01/18/2020 0905   GFRAA >60 01/18/2020 0905   Estimated Creatinine Clearance: 52.5 mL/min (by C-G formula based on SCr of 0.64 mg/dL).  COAG No results found for: INR, PROTIME  Radiology MM 3D SCREEN BREAST UNI RIGHT  Result Date: 01/30/2020 CLINICAL DATA:  Screening. EXAM: DIGITAL SCREENING UNILATERAL RIGHT MAMMOGRAM WITH CAD AND TOMO COMPARISON:  Previous exam(s). ACR Breast Density Category b: There are scattered areas of fibroglandular density. FINDINGS: In the right breast, calcifications warrant further evaluation with magnified views.  The patient is status post left mastectomy. Images were processed with CAD. IMPRESSION: Further evaluation is suggested for calcifications in the right breast. RECOMMENDATION: Diagnostic mammogram of the right breast. (Code:[id number]) The patient will be contacted regarding the findings, and additional imaging will be scheduled. BI-RADS CATEGORY  0: Incomplete. Need additional imaging evaluation and/or prior mammograms for comparison. Electronically Signed   By: Ammie Ferrier M.D.   On: 01/30/2020 16:14   CT ANGIO CHEST AORTA W/CM &/OR WO/CM  Result Date: 01/18/2020 CLINICAL DATA:  Thoracic aortic aneurysm, currently asymptomatic. History left breast carcinoma. EXAM: CT ANGIOGRAPHY CHEST WITH CONTRAST TECHNIQUE: Multidetector CT imaging of the chest was performed using the standard protocol during bolus administration of intravenous contrast. Multiplanar CT image reconstructions and MIPs were obtained to evaluate the vascular anatomy. CONTRAST:  72mL OMNIPAQUE IOHEXOL 350 MG/ML SOLN COMPARISON:  12/05/2018 and previous FINDINGS: Cardiovascular: Heart size normal. No pericardial effusion. Fair opacification of pulmonary arteries noted, and there is no suggestion of pulmonary emboli. Coarse aortic valve leaflet calcifications. Scattered  coronary calcifications. Good contrast opacification of the thoracic aorta. No dissection or stenosis. Minimal calcified atheromatous plaque in the descending thoracic segment. Aortic Root: --Valve: 2.3 cm --Sinuses: 3.8 cm --Sinotubular Junction: 3.3 cm Limitations by motion: Moderate Thoracic Aorta: --Ascending Aorta: 4.4 cm (previously 4.5) --Aortic Arch: 3.6 cm --Descending Aorta: 3.1 cm Bovine variant brachiocephalic arterial origin anatomy without proximal stenosis. Visualized proximal abdominal aorta minimally atheromatous, normal in caliber. Mediastinum/Nodes: No mass or adenopathy. Enhancing subcentimeter bilateral axillary lymph nodes. Lungs/Pleura: No pleural effusion. No pneumothorax. Minimal linear scarring or subsegmental atelectasis in the inferior lingula. Calcified subcentimeter granuloma in the anterior left upper lobe (5:69). No new nodule. Lungs otherwise clear. Upper Abdomen: No acute findings. Musculoskeletal: Changes of left mastectomy with interval resolution of postop seroma. Spondylitic changes in the lower cervical spine. Thoracic dextroscoliosis apex T10 without underlying vertebral anomaly. Right shoulder arthroplasty hardware partially visualized. DJD in the left shoulder. No fracture or worrisome bone lesion. Review of the MIP images confirms the above findings. IMPRESSION: 1. 4.4 cm ascending aortic aneurysm, previously 4.5, without complicating features. Recommend annual imaging followup by CTA or MRA. This recommendation follows 2010 ACCF/AHA/AATS/ACR/ASA/SCA/SCAI/SIR/STS/SVM Guidelines for the Diagnosis and Management of Patients with Thoracic Aortic Disease. Circulation. 2010; 121: Z601-U932. Aortic aneurysm NOS (ICD10-I71.9). 2. Coronary and  Aortic Atherosclerosis (ICD10-I70.0). Electronically Signed   By: Lucrezia Europe M.D.   On: 01/18/2020 09:57     Assessment/Plan 1. Thoracic ascending aortic aneurysm ([place]) No surgery or intervention at this time.  The patient has an  asymptomatic abdominal aortic aneurysm that is less than 6.5 cm in maximal diameter.  I have discussed the natural history of abdominal aortic aneurysm and the small risk of rupture for aneurysm less than 6.5 cm in size.  However, as these small aneurysms tend to enlarge over time, continued surveillance with CT scan is mandatory.  I have also discussed optimizing medical management with hypertension and lipid control and the importance of abstinence from tobacco.  The patient is also encouraged to exercise a minimum of 30 minutes 4 times a week.   Should the patient develop new onset abdominal or back pain or signs of peripheral embolization they are instructed to seek medical attention immediately and to alert the physician providing care that they have an aneurysm.   The patient voices their understanding. The patient will return in 12 months with an Chest CT.   2. 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  The patient has an asymptomatic popliteal artery aneurysm that is less than 2.5 cm in maximal diameter.  I have discussed the natural history of popliteal aneurysm and the small risk of thrombosis for aneurysm less than 2.5 cm in size.  However, as these small aneurysms tend to enlarge over time, continued surveillance with ultrasound is mandatory.   I have also discussed optimizing medical management with hypertension and lipid control and the importance of abstinence from tobacco.  The patient is also encouraged to exercise a minimum of 30 minutes 4 times a week.   Should the patient develop new leg pain or signs of peripheral embolization they are instructed to seek medical attention immediately and to alert the physician providing care that they have an aneurysm.  The patient voices their understanding.   3. Aortic atherosclerosis ([facility]) Recommend:  I do not find evidence of life style limiting vascular disease. The patient specifically denies life style  limitation.  Previous noninvasive studies including ABI's of the legs do not identify critical vascular problems.  The patient should continue walking and begin a more formal exercise program. The patient should continue his antiplatelet therapy and aggressive treatment of the lipid abnormalities.   4. Primary osteoarthritis involving multiple joints Continue NSAID medications as already ordered, these medications have been reviewed and there are no changes at this time.  Continued activity and therapy was stressed.     Hortencia Pilar, MD  02/02/2020 10:29 AM

## 2020-02-05 ENCOUNTER — Ambulatory Visit: Payer: Medicare Other | Admitting: Surgery

## 2020-02-13 ENCOUNTER — Ambulatory Visit
Admission: RE | Admit: 2020-02-13 | Discharge: 2020-02-13 | Disposition: A | Discharge status: Home / Self Care | Payer: Medicare Other | Source: Ambulatory Visit | Attending: Surgery | Admitting: Surgery

## 2020-02-13 ENCOUNTER — Other Ambulatory Visit: Payer: Self-pay

## 2020-02-13 ENCOUNTER — Other Ambulatory Visit: Payer: Self-pay | Admitting: Surgery

## 2020-02-13 DIAGNOSIS — R928 Other abnormal and inconclusive findings on diagnostic imaging of breast: Secondary | ICD-10-CM | POA: Diagnosis not present

## 2020-02-13 DIAGNOSIS — R921 Mammographic calcification found on diagnostic imaging of breast: Secondary | ICD-10-CM

## 2020-02-13 DIAGNOSIS — Z9012 Acquired absence of left breast and nipple: Secondary | ICD-10-CM | POA: Diagnosis not present

## 2020-02-13 DIAGNOSIS — N6489 Other specified disorders of breast: Secondary | ICD-10-CM | POA: Diagnosis not present

## 2020-02-13 DIAGNOSIS — N6310 Unspecified lump in the right breast, unspecified quadrant: Secondary | ICD-10-CM

## 2020-02-15 ENCOUNTER — Telehealth: Payer: Self-pay

## 2020-02-15 NOTE — Telephone Encounter (Signed)
error 

## 2020-02-16 ENCOUNTER — Ambulatory Visit: Payer: Medicare Other | Admitting: Surgery

## 2020-02-22 ENCOUNTER — Ambulatory Visit
Admission: RE | Admit: 2020-02-22 | Discharge: 2020-02-22 | Disposition: A | Discharge status: Home / Self Care | Payer: Medicare Other | Source: Ambulatory Visit | Attending: Surgery | Admitting: Surgery

## 2020-02-22 DIAGNOSIS — R921 Mammographic calcification found on diagnostic imaging of breast: Secondary | ICD-10-CM | POA: Insufficient documentation

## 2020-02-22 DIAGNOSIS — N6021 Fibroadenosis of right breast: Secondary | ICD-10-CM | POA: Diagnosis not present

## 2020-02-22 DIAGNOSIS — R928 Other abnormal and inconclusive findings on diagnostic imaging of breast: Secondary | ICD-10-CM | POA: Insufficient documentation

## 2020-02-22 DIAGNOSIS — R920 Mammographic microcalcification found on diagnostic imaging of breast: Secondary | ICD-10-CM | POA: Diagnosis not present

## 2020-02-22 HISTORY — PX: BREAST BIOPSY: SHX20

## 2020-02-23 LAB — SURGICAL PATHOLOGY

## 2020-02-26 ENCOUNTER — Telehealth: Payer: Self-pay | Admitting: Emergency Medicine

## 2020-02-26 NOTE — Telephone Encounter (Signed)
Per Dr Hampton Abbot patients breast bx is done and results are in. To schedule patient appt for f/u with him at her convenience.   Called patient to schedule appt with no answer. Left vm for patient to call office back to schedule appt.

## 2020-03-01 ENCOUNTER — Encounter: Payer: Self-pay | Admitting: Surgery

## 2020-03-01 ENCOUNTER — Ambulatory Visit (INDEPENDENT_AMBULATORY_CARE_PROVIDER_SITE_OTHER): Payer: Medicare Other | Admitting: Surgery

## 2020-03-01 ENCOUNTER — Other Ambulatory Visit: Payer: Self-pay

## 2020-03-01 VITALS — BP 138/84 | HR 65 | Temp 98.3°F | Resp 14 | Ht 63.5 in | Wt 168.0 lb

## 2020-03-01 DIAGNOSIS — Z170 Estrogen receptor positive status [ER+]: Secondary | ICD-10-CM

## 2020-03-01 DIAGNOSIS — C50412 Malignant neoplasm of upper-outer quadrant of left female breast: Secondary | ICD-10-CM

## 2020-03-01 NOTE — Progress Notes (Signed)
03/01/2020  History of Present Illness: Connie Mitchell is a 86 y.o. female s/p left mastectomy and SLNBx for recurrent left breast cancer in 07/2018.  She presents today for follow up.  She had a screening right breast mammogram on 01/30/20 which showed calcifications in the right breast.  These were confirmed with diagnostic right mammogram on 8/31 and she underwent biopsy on 02/22/20.  Path results showed focal fibroadenomatoid changes and some benign calcifications.  It was felt the results were concordant with the imaging.  She reports some bruising at the biopsy site, but otherwise denies any new breast pain, masses, or nipple changes.  Denies any issues with the left mastectomy site.  Past Medical History: Past Medical History:  Diagnosis Date  . Cancer ([place]) 1991   left breast. treated with rads and lumpectomy  . Cancer ([place]) 2020   newly diagnosed, also left breast  . Cellulitis and abscess of toe of left foot 01/2018  . GERD (gastroesophageal reflux disease)   . Hx of therapeutic radiation 1991   left breast  . Laryngopharyngeal reflux    sometimes food goes down wrong way and she ends up in extreme coughing/choking scenario  . Personal history of radiation therapy 1991   left breast ca  . Primary osteoarthritis   . Thoracic aortic aneurysm (TAA) ([place])      Past Surgical History: Past Surgical History:  Procedure Laterality Date  . BREAST BIOPSY Right 02/22/2020   stereo bx, ribbon clip, path pending   . BREAST LUMPECTOMY Left 1991   breast ca, lumpectomy  . cataracts Bilateral   . CYSTOCELE REPAIR  2010  . EYE SURGERY Bilateral 2017   cataracts extracted  . JOINT REPLACEMENT    . MASTECTOMY Left 07/25/2018   Invasive Mammary Carcinoma  . MASTECTOMY W/ SENTINEL NODE BIOPSY Left 07/25/2018   Procedure: MASTECTOMY WITH SENTINEL LYMPH NODE BIOPSY;  Surgeon: Olean Ree, MD;  Location: [facility] ORS;  Service: General;  Laterality: Left;  . OOPHORECTOMY  1995   ovarian mass -  benign  . REPLACEMENT TOTAL KNEE Bilateral 2012,2013  . SKIN BIOPSY Left    arm growth  . TONSILLECTOMY  1958  . TOTAL SHOULDER ARTHROPLASTY Right 2016    Home Medications: Prior to Admission medications   Medication Sig Start Date End Date Taking? Authorizing Provider  Calcium Carbonate-Vitamin D (CALCIUM-VITAMIN D) 600-125 MG-UNIT TABS Take 1 tablet by mouth 2 (two) times a day.    Yes [provider]  loratadine (CLARITIN REDITABS) 10 MG dissolvable tablet Take 10 mg by mouth at bedtime.   Yes [provider]  montelukast (SINGULAIR) 10 MG tablet TAKE 1 TABLET BY MOUTH EVERYDAY AT BEDTIME 12/22/19  Yes Glean Hess, MD  Multiple Vitamins-Minerals ([place] 50+) CAPS    Yes [provider]  omeprazole (PRILOSEC) 40 MG capsule TAKE 1 CAPSULE BY MOUTH EVERY DAY 09/17/19  Yes Glean Hess, MD  baclofen (LIORESAL) 10 MG tablet TAKE 1 TABLET BY MOUTH EVERYDAY AT BEDTIME 01/19/20   Glean Hess, MD    Allergies: Allergies  Allergen Reactions  . Ciprofloxacin Nausea And Vomiting    Review of Systems: Review of Systems  Constitutional: Negative for chills and fever.  Respiratory: Negative for shortness of breath.   Cardiovascular: Negative for chest pain.  Gastrointestinal: Negative for nausea and vomiting.  Skin: Negative for rash.    Physical Exam BP 138/84   Pulse 65   Temp 98.3 F (36.8 C)   Resp 14  Ht 5' 3.5" (1.613 m)   Wt 168 lb (76.2 kg)   SpO2 95%   BMI 29.29 kg/m  CONSTITUTIONAL: No acute distress HEENT:  Normocephalic, atraumatic, extraocular motion intact. RESPIRATORY:  Lungs are clear, and breath sounds are equal bilaterally. Normal respiratory effort without pathologic use of accessory muscles. CARDIOVASCULAR: Heart is regular without murmurs, gallops, or rubs. BREAST:  Right breast with biopsy dressing in central superior aspect of breast.  Some surrounding ecchymosis.  No palpable masses, skin changes, or nipple  changes.  No right axillary lymphadenopathy.  On left side, mastectomy scar is well healed.  There is a stable nodule in the mid portion of the mastectomy site, just superior to the scar line.  No other issues.  No left axillary lymphadenopathy. NEUROLOGIC:  Motor and sensation is grossly normal.  Cranial nerves are grossly intact. PSYCH:  Alert and oriented to person, place and time. Affect is normal.  Labs/Imaging: Mammograms: FINDINGS: There is a 9 x 9 x 6 mm group of calcifications in the anterior upper, slightly outer right breast. These are dense and vary in size and shape. They are predominantly oval and circumscribed. There is a small amount of associated irregular soft tissue density with indistinct margins.  Mammographic images were processed with CAD.  On physical exam, there is mild palpable soft tissue thickening in the upper outer periareolar right breast without a discrete palpable mass. There are no palpable right axillary lymph nodes.  Targeted ultrasound is performed, showing a 5 mm rounded echogenic focus within a fat lobule in the 12 o'clock retroareolar right breast minimal decreased echogenicity centrally, compatible with fat necrosis. There are no associated calcifications.  No mass suspicious for malignancy or calcifications are visualized sonographically.  Ultrasound of the right axilla demonstrates normal appearing right axillary lymph nodes.  IMPRESSION: 1. 9 mm group of indeterminate calcifications in the anterior upper, slightly outer right breast. 2. No evidence of malignancy elsewhere in the right breast and no adenopathy.  Pathology: A. BREAST, RIGHT UPPER RETROAREOLAR; STEREOTACTIC-GUIDED CORE BIOPSY:  - FOCAL FIBROADENOMATOID CHANGES WITH STROMAL HYALINIZATION AND  CALCIFICATIONS.  - NEGATIVE FOR ATYPIA AND MALIGNANCY.    Assessment and Plan: This is a 86 y.o. female s/p left breast mastectomy for recurrent left breast cancer, and recent right  breast biopsy.  --Discussed pathology results again with the patient.  Overall no evidence of atypia or malignancy. --Can resume yearly screening mammogram. --No changes on the left side. --Follow up in 11 months with screening right mammogram.  Face-to-face time spent with the patient and care providers was 15 minutes, with more than 50% of the time spent counseling, educating, and coordinating care of the patient.     Melvyn Neth, [facility]

## 2020-03-01 NOTE — Patient Instructions (Addendum)
Our office will contact you around July 2022 to schedule your mammogram and office visit for August 2022.   Call the office if you have any questions or concerns.   Breast Self-Awareness Breast self-awareness means being familiar with how your breasts look and feel. It involves checking your breasts regularly and reporting any changes to your health care provider. Practicing breast self-awareness is important. Sometimes changes may not be harmful (are benign), but sometimes a change in your breasts can be a sign of a serious medical problem. It is important to learn how to do this procedure correctly so that you can catch problems early, when treatment is more likely to be successful. All women should practice breast self-awareness, including women who have had breast implants. What you need:  A mirror.  A well-lit room. How to do a breast self-exam A breast self-exam is one way to learn what is normal for your breasts and whether your breasts are changing. To do a breast self-exam: Look for changes  1. Remove all the clothing above your waist. 2. Stand in front of a mirror in a room with good lighting. 3. Put your hands on your hips. 4. Push your hands firmly downward. 5. Compare your breasts in the mirror. Look for differences between them (asymmetry), such as: ? Differences in shape. ? Differences in size. ? Puckers, dips, and bumps in one breast and not the other. 6. Look at each breast for changes in the skin, such as: ? Redness. ? Scaly areas. 7. Look for changes in your nipples, such as: ? Discharge. ? Bleeding. ? Dimpling. ? Redness. ? A change in position. Feel for changes Carefully feel your breasts for lumps and changes. It is best to do this while lying on your back on the floor, and again while sitting or standing in the tub or shower with soapy water on your skin. Feel each breast in the following way: 1. Place the arm on the side of the breast you are examining above  your head. 2. Feel your breast with the other hand. 3. Start in the nipple area and make -inch (2 cm) overlapping circles to feel your breast. Use the pads of your three middle fingers to do this. Apply light pressure, then medium pressure, then firm pressure. The light pressure will allow you to feel the tissue closest to the skin. The medium pressure will allow you to feel the tissue that is a little deeper. The firm pressure will allow you to feel the tissue close to the ribs. 4. Continue the overlapping circles, moving downward over the breast until you feel your ribs below your breast. 5. Move one finger-width toward the center of the body. Continue to use the -inch (2 cm) overlapping circles to feel your breast as you move slowly up toward your collarbone. 6. Continue the up-and-down exam using all three pressures until you reach your armpit.  Write down what you find Writing down what you find can help you remember what to discuss with your health care provider. Write down:  What is normal for each breast.  Any changes that you find in each breast, including: ? The kind of changes you find. ? Any pain or tenderness. ? Size and location of any lumps.  Where you are in your menstrual cycle, if you are still menstruating. General tips and recommendations  Examine your breasts every month.  If you are breastfeeding, the best time to examine your breasts is after a feeding or  after using a breast pump.  If you menstruate, the best time to examine your breasts is 5-7 days after your period. Breasts are generally lumpier during menstrual periods, and it may be more difficult to notice changes.  With time and practice, you will become more familiar with the variations in your breasts and more comfortable with the exam. Contact a health care provider if you:  See a change in the shape or size of your breasts or nipples.  See a change in the skin of your breast or nipples, such as a  reddened or scaly area.  Have unusual discharge from your nipples.  Find a lump or thick area that was not there before.  Have pain in your breasts.  Have any concerns related to your breast health. Summary  Breast self-awareness includes looking for physical changes in your breasts, as well as feeling for any changes within your breasts.  Breast self-awareness should be performed in front of a mirror in a well-lit room.  You should examine your breasts every month. If you menstruate, the best time to examine your breasts is 5-7 days after your menstrual period.  Let your health care provider know of any changes you notice in your breasts, including changes in size, changes on the skin, pain or tenderness, or unusual fluid from your nipples. This information is not intended to replace advice given to you by your health care provider. Make sure you discuss any questions you have with your health care provider. Document Revised: 01/18/2018 Document Reviewed: 01/18/2018 Elsevier Patient Education  [place].

## 2020-03-24 ENCOUNTER — Other Ambulatory Visit: Payer: Self-pay | Admitting: Internal Medicine

## 2020-03-24 NOTE — Telephone Encounter (Signed)
Requested Prescriptions  Pending Prescriptions Disp Refills   omeprazole (PRILOSEC) 40 MG capsule [Pharmacy Med Name: OMEPRAZOLE DR 40 MG CAPSULE] 90 capsule 2    Sig: TAKE 1 CAPSULE BY MOUTH EVERY DAY     Gastroenterology: Proton Pump Inhibitors Passed - 03/24/2020  9:20 AM      Passed - Valid encounter within last 12 months    Recent Outpatient Visits          3 months ago LPRD (laryngopharyngeal reflux disease)   [facility] Glean Hess, MD   1 year ago Osteoporosis, unspecified osteoporosis type, unspecified pathological fracture presence   [facility] Glean Hess, MD   1 year ago [facility] Glean Hess, MD   1 year ago Bursitis of right hip, unspecified bursa   [facility] Glean Hess, MD   1 year ago Allergic cough   [facility] Glean Hess, MD      Future Appointments            In 8 months Army Melia Jesse Sans, MD [facility], [facility]

## 2020-04-18 ENCOUNTER — Other Ambulatory Visit
Admission: RE | Admit: 2020-04-18 | Discharge: 2020-04-18 | Disposition: A | Discharge status: Home / Self Care | Payer: Medicare Other | Source: Ambulatory Visit | Attending: Sports Medicine | Admitting: Sports Medicine

## 2020-04-18 DIAGNOSIS — M25422 Effusion, left elbow: Secondary | ICD-10-CM | POA: Insufficient documentation

## 2020-04-18 DIAGNOSIS — M25522 Pain in left elbow: Secondary | ICD-10-CM | POA: Diagnosis not present

## 2020-04-18 DIAGNOSIS — M19022 Primary osteoarthritis, left elbow: Secondary | ICD-10-CM | POA: Diagnosis not present

## 2020-04-18 LAB — SYNOVIAL CELL COUNT + DIFF, W/ CRYSTALS
Eosinophils-Synovial: 0 %
Lymphocytes-Synovial Fld: 0 %
Monocyte-Macrophage-Synovial Fluid: 9 %
Neutrophil, Synovial: 91 %
WBC, Synovial: 29178 /mm3 — ABNORMAL HIGH (ref 0–200)

## 2020-05-05 DIAGNOSIS — Z23 Encounter for immunization: Secondary | ICD-10-CM | POA: Diagnosis not present

## 2020-05-28 ENCOUNTER — Encounter: Payer: Self-pay | Admitting: Internal Medicine

## 2020-06-05 ENCOUNTER — Other Ambulatory Visit: Payer: Self-pay

## 2020-06-05 ENCOUNTER — Ambulatory Visit (INDEPENDENT_AMBULATORY_CARE_PROVIDER_SITE_OTHER): Payer: Medicare Other | Admitting: Internal Medicine

## 2020-06-05 ENCOUNTER — Encounter: Payer: Self-pay | Admitting: Internal Medicine

## 2020-06-05 VITALS — BP 122/72 | HR 70 | Temp 98.2°F | Ht 64.0 in

## 2020-06-05 DIAGNOSIS — M19012 Primary osteoarthritis, left shoulder: Secondary | ICD-10-CM | POA: Diagnosis not present

## 2020-06-05 DIAGNOSIS — R002 Palpitations: Secondary | ICD-10-CM | POA: Diagnosis not present

## 2020-06-05 NOTE — Progress Notes (Signed)
Date:  06/05/2020   Name:  Connie Mitchell   DOB:  25-Jan-1934   MRN:  [id number]   Chief Complaint: Chest Pain  Chest Pain  This is a new problem. The current episode started in the past 7 days. The pain is mild. Associated symptoms include back pain, a cough and irregular heartbeat (and rapid heart beat with exercise). Pertinent negatives include no abdominal pain, diaphoresis, dizziness, fever, headaches, palpitations, shortness of breath or weakness. Associated symptoms comments: Used home cardio mobile which said Afib.  Shoulder Pain  The pain is present in the left shoulder. This is a chronic (has chronic pain - acutely worse 2 days ago reaching overhead and heard a pop) problem. The problem occurs constantly. The quality of the pain is described as aching. The pain is moderate. Associated symptoms include a limited range of motion. Pertinent negatives include no fever. The symptoms are aggravated by activity.  She completed her bike ride and re-tried the cardio mobile and it was normal without Afib. She exercised again several days later and had no issues. She has repeated the cardio mobile several more times and has been normal.   Lab Results  Component Value Date   CREATININE 0.64 01/18/2020   BUN 19 12/12/2019   NA 139 12/12/2019   K 4.5 12/12/2019   CL 107 12/12/2019   CO2 27 12/12/2019   Lab Results  Component Value Date   CHOL 174 12/12/2019   HDL 56 12/12/2019   LDLCALC 101 (H) 12/12/2019   TRIG 87 12/12/2019   CHOLHDL 3.1 12/12/2019   No results found for: TSH No results found for: HGBA1C Lab Results  Component Value Date   WBC 7.9 12/12/2019   HGB 14.4 12/12/2019   HCT 41.6 12/12/2019   MCV 91.2 12/12/2019   PLT  12/12/2019    PLATELET CLUMPING, SUGGEST RECOLLECTION OF SAMPLE IN CITRATE TUBE.   Lab Results  Component Value Date   ALT 21 12/12/2019   AST 20 12/12/2019   ALKPHOS 63 12/12/2019   BILITOT 0.8 12/12/2019     Review of Systems   Constitutional: Negative for diaphoresis, fatigue and fever.  Respiratory: Positive for cough. Negative for chest tightness, shortness of breath and wheezing.   Cardiovascular: Negative for chest pain, palpitations and leg swelling.  Gastrointestinal: Negative for abdominal pain and constipation.  Musculoskeletal: Positive for arthralgias, back pain and gait problem (uses rolator).  Neurological: Negative for dizziness, syncope, weakness, light-headedness and headaches.  Psychiatric/Behavioral: Negative for sleep disturbance. The patient is not nervous/anxious.     Patient Active Problem List   Diagnosis Date Noted  . Aortic atherosclerosis (HCC) 12/12/2019  . Axillary adenopathy 01/01/2019  . 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HCC) 12/30/2018  . Osteopenia of neck of right femur 07/12/2018  . Malignant neoplasm of upper-outer quadrant of left female breast (HCC) 07/07/2018  . Thoracic ascending aortic aneurysm (HCC) 05/27/2018  . LPRD (laryngopharyngeal reflux disease) 03/17/2018  . Chronic foot pain 11/25/2017  . Lumbar degenerative disc disease 09/24/2017  . Hx of Lyme disease 09/24/2017  . Hx of basal cell carcinoma 09/24/2017  . Nuclear sclerotic cataract of left eye 08/09/2015  . Osteoporosis 07/04/2014  . Primary localized osteoarthrosis of shoulder region 12/13/2012  . Osteoarthritis 12/18/2011    Allergies  Allergen Reactions  . Ciprofloxacin Nausea And Vomiting    Past Surgical History:  Procedure Laterality Date  . BREAST BIOPSY Right 02/22/2020   stereo bx, ribbon clip, path pending   . BREAST LUMPECTOMY Left  1991   breast ca, lumpectomy  . cataracts Bilateral   . CYSTOCELE REPAIR  2010  . EYE SURGERY Bilateral 2017   cataracts extracted  . JOINT REPLACEMENT    . MASTECTOMY Left 07/25/2018   Invasive Mammary Carcinoma  . MASTECTOMY W/ SENTINEL NODE BIOPSY Left 07/25/2018   Procedure: MASTECTOMY WITH SENTINEL LYMPH NODE BIOPSY;  Surgeon: Olean Ree, MD;  Location: [facility]  ORS;  Service: General;  Laterality: Left;  . OOPHORECTOMY  1995   ovarian mass - benign  . REPLACEMENT TOTAL KNEE Bilateral 2012,2013  . SKIN BIOPSY Left    arm growth  . TONSILLECTOMY  1958  . TOTAL SHOULDER ARTHROPLASTY Right 2016    Social History   Tobacco Use  . Smoking status: Never Smoker  . Smokeless tobacco: Never Used  . Tobacco comment: smoking cessation materials not required  Vaping Use  . Vaping Use: Never used  Substance Use Topics  . Alcohol use: Yes    Alcohol/week: 2.0 standard drinks    Types: 1 Cans of beer, 1 Glasses of wine per week    Comment: occasional  . Drug use: Never     Medication list has been reviewed and updated.  Current Meds  Medication Sig  . Calcium Carbonate-Vitamin D (CALCIUM-VITAMIN D) 600-125 MG-UNIT TABS Take 1 tablet by mouth 2 (two) times a day.   . loratadine (CLARITIN REDITABS) 10 MG dissolvable tablet Take 10 mg by mouth at bedtime.  . montelukast (SINGULAIR) 10 MG tablet TAKE 1 TABLET BY MOUTH EVERYDAY AT BEDTIME  . Multiple Vitamins-Minerals ([place] 50+) CAPS   . omeprazole (PRILOSEC) 40 MG capsule TAKE 1 CAPSULE BY MOUTH EVERY DAY    PHQ 2/9 Scores 06/05/2020 12/12/2019 11/27/2019 11/28/2018  PHQ - 2 Score 0 2 2 1   PHQ- 9 Score 1 2 - -    GAD 7 : Generalized Anxiety Score 06/05/2020 12/12/2019  Nervous, Anxious, on Edge 0 1  Control/stop worrying 0 1  Worry too much - different things 0 1  Trouble relaxing 0 0  Restless 0 0  Easily annoyed or irritable 0 0  Afraid - awful might happen 0 0  Total GAD 7 Score 0 3  Anxiety Difficulty - Not difficult at all    BP Readings from Last 3 Encounters:  06/05/20 122/72  03/01/20 138/84  02/01/20 (!) 151/79    Physical Exam Vitals and nursing note reviewed.  Constitutional:      General: She is not in acute distress.    Appearance: She is well-developed.  HENT:     Head: Normocephalic and atraumatic.  Cardiovascular:     Rate and Rhythm: Normal rate and  regular rhythm.  No extrasystoles are present.    Heart sounds: Normal heart sounds. No murmur heard.  No diastolic murmur is present.   Pulmonary:     Effort: Pulmonary effort is normal. No respiratory distress.     Breath sounds: Normal breath sounds. No wheezing or rhonchi.  Musculoskeletal:     Right lower leg: No edema.     Left lower leg: No edema.  Skin:    General: Skin is warm and dry.     Findings: No rash.  Neurological:     Mental Status: She is alert and oriented to person, place, and time.  Psychiatric:        Mood and Affect: Mood and affect normal.        Behavior: Behavior normal.  Thought Content: Thought content normal.     Wt Readings from Last 3 Encounters:  03/01/20 168 lb (76.2 kg)  02/01/20 169 lb (76.7 kg)  12/12/19 169 lb 8 oz (76.9 kg)    BP 122/72   Pulse 70   Temp 98.2 F (36.8 C) (Oral)   Ht 5\' 4"  (1.626 m)   SpO2 96%   BMI 28.84 kg/m   Assessment and Plan: 1. Palpitations One reading on Cardio Mobile showing Afib but none since Her fatigue episode may have been due to unrecognized Afib but the episodes was brief and has not recurred. Recommend that she monitor her heart rhythm and call if recurrent Afib is noted Will not start blood thinners at this time due to risk of bleeding. - EKG 12-Lead - SR @ 70; WNL - TSH - Basic metabolic panel  2. Primary localized osteoarthrosis of left shoulder region Continue rest, ice or heat Tylenol as needed Follow up with Ortho if needed   Partially dictated using Dragon software. Any errors are unintentional.  Halina Maidens, MD [facility]  06/05/2020

## 2020-06-06 ENCOUNTER — Telehealth: Payer: Self-pay

## 2020-06-06 LAB — BASIC METABOLIC PANEL
BUN/Creatinine Ratio: 28 (ref 12–28)
BUN: 22 mg/dL (ref 8–27)
CO2: 25 mmol/L (ref 20–29)
Calcium: 9.5 mg/dL (ref 8.7–10.3)
Chloride: 101 mmol/L (ref 96–106)
Creatinine, Ser: 0.79 mg/dL (ref 0.57–1.00)
GFR calc Af Amer: 79 mL/min/{1.73_m2} (ref >59)
GFR calc non Af Amer: 69 mL/min/{1.73_m2} (ref >59)
Glucose: 75 mg/dL (ref 65–99)
Potassium: 4.3 mmol/L (ref 3.5–5.2)
Sodium: 141 mmol/L (ref 134–144)

## 2020-06-06 LAB — TSH: TSH: 5.1 u[IU]/mL — ABNORMAL HIGH (ref 0.450–4.500)

## 2020-06-06 NOTE — Telephone Encounter (Signed)
Pt. Given lab results, verbalizes understanding. 

## 2020-06-11 ENCOUNTER — Other Ambulatory Visit: Payer: Self-pay

## 2020-06-11 ENCOUNTER — Ambulatory Visit: Payer: Medicare Other | Admitting: Internal Medicine

## 2020-06-11 ENCOUNTER — Ambulatory Visit: Payer: Self-pay

## 2020-06-11 DIAGNOSIS — I4891 Unspecified atrial fibrillation: Secondary | ICD-10-CM

## 2020-06-11 NOTE — Telephone Encounter (Signed)
Noted pt has appt today 12/28.  KP

## 2020-06-11 NOTE — Telephone Encounter (Signed)
Patient called stating that she received a kardio m0ble devise for christmas  Today while sitting she became extremely dizzy  She states that cardio mobile states that she was in a fib.  She says she took her BP and it was 129/109 HR 78.  She states she has no symptoms now but she and Dr Judithann Graves have discussed this in office.  BP now is 151/89 HR 79.  She states that she is breathing fine and has no chest pain. Per protcol Same day OV was scheduled.  Care advice read to patient.  She verbalized understanding. Reason for Disposition  Age > 60 years (Exception: brief heartbeat symptoms that went away and now feels well)  Answer Assessment - Initial Assessment Questions 1. DESCRIPTION: "Please describe your heart rate or heartbeat that you are having" (e.g., fast/slow, regular/irregular, skipped or extra beats, "palpitations")     irregular 2. ONSET: "When did it start?" (Minutes, hours or days)     This Am 3. DURATION: "How long does it last" (e.g., seconds, minutes, hours)     Don't feel that is is going on now 4. PATTERN "Does it come and go, or has it been constant since it started?"  "Does it get worse with exertion?"   "Are you feeling it now?"    Dizziness recently comes ago 5. TAP: "Using your hand, can you tap out what you are feeling on a chair or table in front of you, so that I can hear?" (Note: not all patients can do this)       No 6. HEART RATE: "Can you tell me your heart rate?" "How many beats in 15 seconds?"  (Note: not all patients can do this)       78 A fib detected per cardio moble 7. RECURRENT SYMPTOM: "Have you ever had this before?" If Yes, ask: "When was the last time?" and "What happened that time?"      Symptoms but never diagnosed 8. CAUSE: "What do you think is causing the palpitations?"     A fib 9. CARDIAC HISTORY: "Do you have any history of heart disease?" (e.g., heart attack, angina, bypass surgery, angioplasty, arrhythmia)     no 10. OTHER SYMPTOMS: "Do you  have any other symptoms?" (e.g., dizziness, chest pain, sweating, difficulty breathing)      Dizziness, hypertension, blurry vision, BP 127/109 11. PREGNANCY: "Is there any chance you are pregnant?" "When was your last menstrual period?"       N/A  Protocols used: HEART RATE AND HEARTBEAT QUESTIONS-A-AH

## 2020-06-17 DIAGNOSIS — R0989 Other specified symptoms and signs involving the circulatory and respiratory systems: Secondary | ICD-10-CM | POA: Diagnosis not present

## 2020-06-17 DIAGNOSIS — I480 Paroxysmal atrial fibrillation: Secondary | ICD-10-CM | POA: Diagnosis not present

## 2020-06-26 ENCOUNTER — Other Ambulatory Visit: Payer: Self-pay | Admitting: Internal Medicine

## 2020-06-26 DIAGNOSIS — R058 Other specified cough: Secondary | ICD-10-CM

## 2020-07-02 DIAGNOSIS — I4892 Unspecified atrial flutter: Secondary | ICD-10-CM | POA: Diagnosis not present

## 2020-07-02 DIAGNOSIS — I4891 Unspecified atrial fibrillation: Secondary | ICD-10-CM | POA: Diagnosis not present

## 2020-07-14 ENCOUNTER — Other Ambulatory Visit: Payer: Self-pay

## 2020-07-14 ENCOUNTER — Inpatient Hospital Stay
Admission: EM | Admit: 2020-07-14 | Discharge: 2020-07-16 | DRG: 392 | Disposition: A | Discharge status: Home / Self Care | Payer: Medicare Other | Source: Ambulatory Visit | Attending: Internal Medicine | Admitting: Internal Medicine

## 2020-07-14 ENCOUNTER — Ambulatory Visit (INDEPENDENT_AMBULATORY_CARE_PROVIDER_SITE_OTHER)
Admit: 2020-07-14 | Discharge: 2020-07-14 | Disposition: A | Discharge status: Home / Self Care | Payer: Medicare Other | Attending: Family Medicine | Admitting: Family Medicine

## 2020-07-14 ENCOUNTER — Ambulatory Visit (INDEPENDENT_AMBULATORY_CARE_PROVIDER_SITE_OTHER)
Admission: EM | Admit: 2020-07-14 | Discharge: 2020-07-14 | Disposition: A | Discharge status: Home / Self Care | Payer: Medicare Other | Source: Home / Self Care | Attending: Family Medicine | Admitting: Family Medicine

## 2020-07-14 DIAGNOSIS — Z803 Family history of malignant neoplasm of breast: Secondary | ICD-10-CM

## 2020-07-14 DIAGNOSIS — I480 Paroxysmal atrial fibrillation: Secondary | ICD-10-CM

## 2020-07-14 DIAGNOSIS — Z820 Family history of epilepsy and other diseases of the nervous system: Secondary | ICD-10-CM | POA: Diagnosis not present

## 2020-07-14 DIAGNOSIS — K219 Gastro-esophageal reflux disease without esophagitis: Secondary | ICD-10-CM | POA: Diagnosis present

## 2020-07-14 DIAGNOSIS — R109 Unspecified abdominal pain: Secondary | ICD-10-CM

## 2020-07-14 DIAGNOSIS — K529 Noninfective gastroenteritis and colitis, unspecified: Secondary | ICD-10-CM | POA: Diagnosis not present

## 2020-07-14 DIAGNOSIS — Z853 Personal history of malignant neoplasm of breast: Secondary | ICD-10-CM | POA: Diagnosis not present

## 2020-07-14 DIAGNOSIS — M810 Age-related osteoporosis without current pathological fracture: Secondary | ICD-10-CM | POA: Diagnosis present

## 2020-07-14 DIAGNOSIS — Z8249 Family history of ischemic heart disease and other diseases of the circulatory system: Secondary | ICD-10-CM | POA: Diagnosis not present

## 2020-07-14 DIAGNOSIS — K625 Hemorrhage of anus and rectum: Secondary | ICD-10-CM

## 2020-07-14 DIAGNOSIS — Z7901 Long term (current) use of anticoagulants: Secondary | ICD-10-CM | POA: Diagnosis not present

## 2020-07-14 DIAGNOSIS — Z923 Personal history of irradiation: Secondary | ICD-10-CM

## 2020-07-14 DIAGNOSIS — I712 Thoracic aortic aneurysm, without rupture: Secondary | ICD-10-CM | POA: Diagnosis present

## 2020-07-14 DIAGNOSIS — Z170 Estrogen receptor positive status [ER+]: Secondary | ICD-10-CM

## 2020-07-14 DIAGNOSIS — I4891 Unspecified atrial fibrillation: Secondary | ICD-10-CM | POA: Diagnosis present

## 2020-07-14 DIAGNOSIS — M1991 Primary osteoarthritis, unspecified site: Secondary | ICD-10-CM | POA: Diagnosis present

## 2020-07-14 DIAGNOSIS — C50412 Malignant neoplasm of upper-outer quadrant of left female breast: Secondary | ICD-10-CM | POA: Diagnosis not present

## 2020-07-14 DIAGNOSIS — K6289 Other specified diseases of anus and rectum: Secondary | ICD-10-CM | POA: Diagnosis present

## 2020-07-14 DIAGNOSIS — R630 Anorexia: Secondary | ICD-10-CM | POA: Diagnosis present

## 2020-07-14 DIAGNOSIS — Z20822 Contact with and (suspected) exposure to covid-19: Secondary | ICD-10-CM | POA: Diagnosis present

## 2020-07-14 DIAGNOSIS — A09 Infectious gastroenteritis and colitis, unspecified: Secondary | ICD-10-CM

## 2020-07-14 DIAGNOSIS — Z79899 Other long term (current) drug therapy: Secondary | ICD-10-CM

## 2020-07-14 DIAGNOSIS — I4892 Unspecified atrial flutter: Secondary | ICD-10-CM | POA: Diagnosis present

## 2020-07-14 DIAGNOSIS — R58 Hemorrhage, not elsewhere classified: Secondary | ICD-10-CM | POA: Diagnosis not present

## 2020-07-14 DIAGNOSIS — Z85828 Personal history of other malignant neoplasm of skin: Secondary | ICD-10-CM

## 2020-07-14 DIAGNOSIS — R279 Unspecified lack of coordination: Secondary | ICD-10-CM | POA: Diagnosis not present

## 2020-07-14 DIAGNOSIS — Z9012 Acquired absence of left breast and nipple: Secondary | ICD-10-CM

## 2020-07-14 DIAGNOSIS — Z823 Family history of stroke: Secondary | ICD-10-CM | POA: Diagnosis not present

## 2020-07-14 DIAGNOSIS — R509 Fever, unspecified: Secondary | ICD-10-CM | POA: Diagnosis not present

## 2020-07-14 DIAGNOSIS — R1084 Generalized abdominal pain: Secondary | ICD-10-CM | POA: Diagnosis not present

## 2020-07-14 DIAGNOSIS — Z743 Need for continuous supervision: Secondary | ICD-10-CM | POA: Diagnosis not present

## 2020-07-14 HISTORY — DX: Unspecified atrial fibrillation: I48.91

## 2020-07-14 HISTORY — DX: Noninfective gastroenteritis and colitis, unspecified: K52.9

## 2020-07-14 HISTORY — DX: Hemorrhage of anus and rectum: K62.5

## 2020-07-14 LAB — CBC WITH DIFFERENTIAL/PLATELET
Abs Immature Granulocytes: 0.07 10*3/uL (ref 0.00–0.07)
Basophils Absolute: 0 10*3/uL (ref 0.0–0.1)
Basophils Relative: 0 %
Eosinophils Absolute: 0.1 10*3/uL (ref 0.0–0.5)
Eosinophils Relative: 1 %
HCT: 42.8 % (ref 36.0–46.0)
Hemoglobin: 14.3 g/dL (ref 12.0–15.0)
Immature Granulocytes: 1 %
Lymphocytes Relative: 11 %
Lymphs Abs: 1.3 10*3/uL (ref 0.7–4.0)
MCH: 30.6 pg (ref 26.0–34.0)
MCHC: 33.4 g/dL (ref 30.0–36.0)
MCV: 91.5 fL (ref 80.0–100.0)
Monocytes Absolute: 0.8 10*3/uL (ref 0.1–1.0)
Monocytes Relative: 7 %
Neutro Abs: 9.5 10*3/uL — ABNORMAL HIGH (ref 1.7–7.7)
Neutrophils Relative %: 80 %
RBC: 4.68 MIL/uL (ref 3.87–5.11)
RDW: 15 % (ref 11.5–15.5)
WBC: 11.8 10*3/uL — ABNORMAL HIGH (ref 4.0–10.5)
nRBC: 0 % (ref 0.0–0.2)

## 2020-07-14 LAB — TYPE AND SCREEN
ABO/RH(D): AB POS
Antibody Screen: NEGATIVE

## 2020-07-14 LAB — COMPREHENSIVE METABOLIC PANEL
ALT: 25 U/L (ref 0–44)
AST: 21 U/L (ref 15–41)
Albumin: 3.7 g/dL (ref 3.5–5.0)
Alkaline Phosphatase: 57 U/L (ref 38–126)
Anion gap: 10 (ref 5–15)
BUN: 15 mg/dL (ref 8–23)
CO2: 25 mmol/L (ref 22–32)
Calcium: 8.8 mg/dL — ABNORMAL LOW (ref 8.9–10.3)
Chloride: 99 mmol/L (ref 98–111)
Creatinine, Ser: 0.62 mg/dL (ref 0.44–1.00)
GFR, Estimated: >60 mL/min (ref >60)
Glucose, Bld: 99 mg/dL (ref 70–99)
Potassium: 3.9 mmol/L (ref 3.5–5.1)
Sodium: 134 mmol/L — ABNORMAL LOW (ref 135–145)
Total Bilirubin: 0.6 mg/dL (ref 0.3–1.2)
Total Protein: 7.1 g/dL (ref 6.5–8.1)

## 2020-07-14 LAB — LACTIC ACID, PLASMA
Lactic Acid, Venous: 1.1 mmol/L (ref 0.5–1.9)
Lactic Acid, Venous: 1.4 mmol/L (ref 0.5–1.9)

## 2020-07-14 LAB — LIPASE, BLOOD: Lipase: 30 U/L (ref 11–51)

## 2020-07-14 MED ORDER — PIPERACILLIN-TAZOBACTAM 3.375 G IVPB 30 MIN
3.3750 g | Freq: Once | INTRAVENOUS | Status: AC
  Administered 2020-07-14: 3.375 g via INTRAVENOUS
  Filled 2020-07-14: qty 50

## 2020-07-14 MED ORDER — SODIUM CHLORIDE 0.9 % IV SOLN: Freq: Once | INTRAVENOUS | Status: AC

## 2020-07-14 MED ORDER — PIPERACILLIN-TAZOBACTAM 3.375 G IVPB
3.3750 g | Freq: Three times a day (TID) | INTRAVENOUS | Status: DC
  Administered 2020-07-15 – 2020-07-16 (×4): 3.375 g via INTRAVENOUS
  Filled 2020-07-14 (×4): qty 50

## 2020-07-14 NOTE — ED Notes (Signed)
Unable to obtain 2nd culture. Per MD, ok to start ABX and differ culture

## 2020-07-14 NOTE — ED Triage Notes (Signed)
Pt comes ems from [facility] urgent care with rectal bleeding/proctitis dx. UC recommended scans/antibiotics. Pt AOx4, denies any pain right now.

## 2020-07-14 NOTE — H&P (Signed)
History and Physical    Connie Mitchell EUM:[id number] DOB: 1934-05-10 DOA: 07/14/2020  PCP: Glean Hess, MD  Patient coming from: Home   I have personally briefly reviewed patient's old medical records in [place]  Chief Complaint: rectal bleeding  HPI: Connie Mitchell is a 87 y.o. female with medical history significant for atrial fibrillation/flutter on Xarelto, breast cancer with recurrent s/p radiation and left mastectomy, thoracic aortic aneurysm, and osteoporosis who presents with concerns of rectal bleeding.  Patient presented to urgent care today with concerns of rectal bleeding since she is on Xarelto.  She sees blood only when wiping.  She says for the past 3 days she has been having lower abdominal pain that is worse with movement and has been having mucousy stool.  Had a fever up to 100.5 today.  She is still able to eat and drink.  Denies any nausea, vomiting.  No sick contact or recent travel.  Has history of oophorectomy due to a cyst.  She endorsed that when she was in her 19s she was diagnosed with ulcerative colitis and was on sulfasalazine for a year to and was told to discontinue.  Has not really had any abdominal issues since then.  Her last colonoscopy was about 10 years ago and she is not aged out.  Does not remember anything significant in her last colonoscopy.  ED Course: She was febrile with temperature of 100.1 with leukocytosis of 11.8.  CT of the abdomen shows findings consistent with enteritis and proctitis.    Review of Systems: Constitutional: No Weight Change, +Fever ENT/Mouth: No sore throat, No Rhinorrhea Eyes: No Eye Pain, No Vision Changes Cardiovascular: No Chest Pain, no SOB Respiratory: No Cough, No Sputum, No Wheezing, no Dyspnea  Gastrointestinal: No Nausea, No Vomiting, + Diarrhea, No Constipation, + Pain Genitourinary: no Urinary Incontinence, No Urgency, No Flank Pain Musculoskeletal: No Arthralgias, No Myalgias Skin: No Skin Lesions,  No Pruritus, Neuro: no Weakness, No Numbness,  No Loss of Consciousness, No Syncope Psych: No Anxiety/Panic, No Depression, no decrease appetite Heme/Lymph: No Bruising, No Bleeding  Past Medical History:  Diagnosis Date  . A-fib ([place])   . Cancer ([place]) 1991   left breast. treated with rads and lumpectomy  . Cancer ([place]) 2020   newly diagnosed, also left breast  . Cellulitis and abscess of toe of left foot 01/2018  . GERD (gastroesophageal reflux disease)   . Hx of therapeutic radiation 1991   left breast  . Laryngopharyngeal reflux    sometimes food goes down wrong way and she ends up in extreme coughing/choking scenario  . Personal history of radiation therapy 1991   left breast ca  . Primary osteoarthritis   . Thoracic aortic aneurysm (TAA) ([place])     Past Surgical History:  Procedure Laterality Date  . BREAST BIOPSY Right 02/22/2020   stereo bx, ribbon clip, path pending   . BREAST LUMPECTOMY Left 1991   breast ca, lumpectomy  . cataracts Bilateral   . CYSTOCELE REPAIR  2010  . EYE SURGERY Bilateral 2017   cataracts extracted  . JOINT REPLACEMENT    . MASTECTOMY Left 07/25/2018   Invasive Mammary Carcinoma  . MASTECTOMY W/ SENTINEL NODE BIOPSY Left 07/25/2018   Procedure: MASTECTOMY WITH SENTINEL LYMPH NODE BIOPSY;  Surgeon: Olean Ree, MD;  Location: [facility] ORS;  Service: General;  Laterality: Left;  . OOPHORECTOMY  1995   ovarian mass - benign  . REPLACEMENT TOTAL KNEE Bilateral 2012,2013  . SKIN BIOPSY  Left    arm growth  . TONSILLECTOMY  1958  . TOTAL SHOULDER ARTHROPLASTY Right 2016     reports that she has never smoked. She has never used smokeless tobacco. She reports current alcohol use of about 2.0 standard drinks of alcohol per week. She reports that she does not use drugs. Social History  Allergies  Allergen Reactions  . Ciprofloxacin Nausea And Vomiting    Family History  Problem Relation Age of Onset  . Hypertension Mother   . Alzheimer's  disease Mother   . Stroke Father   . Cancer Brother        prostate  . Atrial fibrillation Brother   . Cancer Other        breast  . Atrial fibrillation Brother   . Breast cancer Cousin 72       paternal x 2     Prior to Admission medications   Medication Sig Start Date End Date Taking? Authorizing Provider  Calcium Carbonate-Vitamin D (CALCIUM-VITAMIN D) 600-125 MG-UNIT TABS Take 1 tablet by mouth 2 (two) times a day.     [provider]  loratadine (CLARITIN REDITABS) 10 MG dissolvable tablet Take 10 mg by mouth at bedtime.    [provider]  montelukast (SINGULAIR) 10 MG tablet TAKE 1 TABLET BY MOUTH EVERYDAY AT BEDTIME 06/26/20   Glean Hess, MD  Multiple Vitamins-Minerals ([place] 50+) CAPS     [provider]  omeprazole (PRILOSEC) 40 MG capsule TAKE 1 CAPSULE BY MOUTH EVERY DAY 03/24/20   Glean Hess, MD  XARELTO 20 MG TABS tablet Take 20 mg by mouth at bedtime. 06/17/20   [provider]    Physical Exam: Vitals:   07/14/20 1654 07/14/20 1655 07/14/20 1700  BP: (!) 156/88  133/74  Pulse: 83  80  Resp: 18  18  Temp: 99.7 F (37.6 C)    TempSrc: Oral    SpO2: 98%  98%  Weight:  76.7 kg   Height:  5\' 3"  (1.6 m)     Constitutional: NAD, calm, comfortable, nontoxic elderly female laying at 20 degrees incline in bed Vitals:   07/14/20 1654 07/14/20 1655 07/14/20 1700  BP: (!) 156/88  133/74  Pulse: 83  80  Resp: 18  18  Temp: 99.7 F (37.6 C)    TempSrc: Oral    SpO2: 98%  98%  Weight:  76.7 kg   Height:  5\' 3"  (1.6 m)    Eyes: PERRL, lids and conjunctivae normal ENMT: Mucous membranes are moist.  Neck: normal, supple Respiratory: clear to auscultation bilaterally, no wheezing, no crackles. Normal respiratory effort. No accessory muscle use.  Cardiovascular: Regular rate and rhythm, no murmurs / rubs / gallops. No extremity edema. .  Abdomen: Diffuse, no masses palpated. Bowel sounds positive.   Musculoskeletal: no clubbing / cyanosis. No joint deformity upper and lower extremities. Good ROM, no contractures. Normal muscle tone.  Skin: no rashes, lesions, ulcers. No induration Neurologic: CN 2-12 grossly intact. Sensation intact. Strength 5/5 in all 4.  Psychiatric: Normal judgment and insight. Alert and oriented x 3. Normal mood.     Labs on Admission: I have personally reviewed following labs and imaging studies  CBC: Recent Labs  Lab 07/14/20 1525  WBC 11.8*  NEUTROABS 9.5*  HGB 14.3  HCT 42.8  MCV 91.5  PLT ACLUMP   Basic Metabolic Panel: Recent Labs  Lab 07/14/20 1525  NA 134*  K 3.9  CL 99  CO2  25  GLUCOSE 99  BUN 15  CREATININE 0.62  CALCIUM 8.8*   GFR: Estimated Creatinine Clearance: 51.3 mL/min (by C-G formula based on SCr of 0.62 mg/dL). Liver Function Tests: Recent Labs  Lab 07/14/20 1525  AST 21  ALT 25  ALKPHOS 57  BILITOT 0.6  PROT 7.1  ALBUMIN 3.7   Recent Labs  Lab 07/14/20 1525  LIPASE 30   No results for input(s): AMMONIA in the last 168 hours. Coagulation Profile: No results for input(s): INR, PROTIME in the last 168 hours. Cardiac Enzymes: No results for input(s): CKTOTAL, CKMB, CKMBINDEX, TROPONINI in the last 168 hours. BNP (last 3 results) No results for input(s): PROBNP in the last 8760 hours. HbA1C: No results for input(s): HGBA1C in the last 72 hours. CBG: No results for input(s): GLUCAP in the last 168 hours. Lipid Profile: No results for input(s): CHOL, HDL, LDLCALC, TRIG, CHOLHDL, LDLDIRECT in the last 72 hours. Thyroid Function Tests: No results for input(s): TSH, T4TOTAL, FREET4, T3FREE, THYROIDAB in the last 72 hours. Anemia Panel: No results for input(s): VITAMINB12, FOLATE, FERRITIN, TIBC, IRON, RETICCTPCT in the last 72 hours. Urine analysis:    Component Value Date/Time   BILIRUBINUR neg 05/27/2018 1057   PROTEINUR Negative 05/27/2018 1057   UROBILINOGEN 0.2 05/27/2018 1057   NITRITE neg  05/27/2018 1057   LEUKOCYTESUR Trace (A) 05/27/2018 1057    Radiological Exams on Admission: CT ABDOMEN PELVIS WO CONTRAST  Result Date: 07/14/2020 CLINICAL DATA:  Abdominal pain and fever. EXAM: CT ABDOMEN AND PELVIS WITHOUT CONTRAST TECHNIQUE: Multidetector CT imaging of the abdomen and pelvis was performed following the standard protocol without IV contrast. COMPARISON:  None FINDINGS: Lower chest: There is no acute abnormality lower thorax.Again noted is aneurysmal dilatation of the thoracic aorta. Hepatobiliary: The liver is normal. Normal gallbladder.There is no biliary ductal dilation. Pancreas: Normal contours without ductal dilatation. No peripancreatic fluid collection. Spleen: Unremarkable. Adrenals/Urinary Tract: --Adrenal glands: Unremarkable. --Right kidney/ureter: There appears to be a large right-sided peripelvic cyst versus less likely a right-sided UPJ obstruction given the appearance was somewhat similar to CT dated December 05, 2018. --Left kidney/ureter: There is a punctate nonobstructing stone in the upper pole. --Urinary bladder: Unremarkable. Stomach/Bowel: --Stomach/Duodenum: No hiatal hernia or other gastric abnormality. Normal duodenal course and caliber. --Small bowel: There appears to be some wall thickening in minimal dilatation of several small bowel loops in the low mid abdomen. There is adjacent mesenteric fat stranding and edema. --Colon: Rectosigmoid diverticulosis without acute inflammation. There is diffuse circumferential wall thickening of portions of the mid to distal rectum. --Appendix: Normal. Vascular/Lymphatic: Atherosclerotic calcification is present within the non-aneurysmal abdominal aorta, without hemodynamically significant stenosis. --No retroperitoneal lymphadenopathy. --No mesenteric lymphadenopathy. --No pelvic or inguinal lymphadenopathy. Reproductive: Unremarkable Other: There is a trace amount of free fluid the patient's pelvis. The abdominal wall is normal.  Musculoskeletal. There are advanced degenerative changes throughout the visualized thoracolumbar spine. There is facet arthrosis in the lower lumbar segments. IMPRESSION: 1. There appears to be some wall thickening in minimal dilatation of several small bowel loops in the low mid abdomen with adjacent mesenteric fat stranding and edema. Findings may be secondary to an infectious or inflammatory enteritis. 2. There is diffuse circumferential wall thickening of portions of the mid to distal rectum. This may be secondary to proctitis. However, an underlying mass is not excluded and should be correlated with sigmoidoscopy/colonoscopy. 3. Rectosigmoid diverticulosis without acute inflammation. 4. Partially visualized ascending thoracic aortic aneurysm, better visualized on the patient's  prior CT of the chest. 5. Probable large right-sided peripelvic cysts versus less likely hydronephrosis with a right UPJ obstruction. This is not well evaluated in the absence of IV contrast. 6. Nonobstructing left-sided nephrolithiasis. Aortic Atherosclerosis (ICD10-I70.0). Electronically Signed   By: Constance Holster M.D.   On: 07/14/2020 15:54      Assessment/Plan  Enteritis/proctitis Evidence of inflammation seen on CT abdomen Continue IV Zosyn C. difficile test pending Patient endorses a remote history of ulcerative colitis in her 64s and was treated briefly for here.  Recommend GI consult if she does not clinically improve.  Otherwise will recommend her to follow-up with GI outpatient  Rectal bleed Suspect this is mild due to mucosal injury from inflammation.  Will continue with Xarelto at this time  History of atrial fibrillation/flutter Continue Xarelto  History of breast cancer Continue follow-up with oncology  DVT prophylaxis:Xarelto Code Status: Full Family Communication: Plan discussed with patient at bedside  disposition Plan: Home with observation Consults called:  Admission status:  Inpatient  Level of care: Med-Surg  Status is: Inpatient  Remains inpatient appropriate because:Inpatient level of care appropriate due to severity of illness   Dispo: The patient is from: Home              Anticipated d/c is to: Home              Anticipated d/c date is: 3 days              Patient currently is not medically stable to d/c.   Difficult to place patient No         Orene Desanctis DO [facility]   If 7PM-7AM, please contact night-coverage [email or web address]   07/14/2020, 7:49 PM

## 2020-07-14 NOTE — ED Notes (Addendum)
Type and screen hemolyzed. Per MD, ok to wait until next lab draw to obtain. Lab called to obtain 2nd culture

## 2020-07-14 NOTE — ED Provider Notes (Signed)
[facility]    CSN: [id number] Arrival date & time: 07/14/20  1424      History   Chief Complaint Chief Complaint  Patient presents with  . Diarrhea   HPI  87 year old female presents with abdominal pain, rectal bleeding, and fever.  Patient reports that this started 2 days ago.  She reports abdominal pain.  Pain is in the lower abdomen.  She reports that she has had some mucousy stool.  She reports hematochezia.  She has had fever as of today.  She believes that she had a fever yesterday as well.  It has been as high as 100.5.  Patient reports pain in her rectum.  She rates her pain is 8/10 in severity.  She is on Xarelto.  She is concerned given the bleeding in the setting of her Xarelto.  No relieving factors.  No reports of nausea vomiting.  No other associated symptoms.  No other complaints.  Past Medical History:  Diagnosis Date  . A-fib ([place])   . Cancer ([place]) 1991   left breast. treated with rads and lumpectomy  . Cancer ([place]) 2020   newly diagnosed, also left breast  . Cellulitis and abscess of toe of left foot 01/2018  . GERD (gastroesophageal reflux disease)   . Hx of therapeutic radiation 1991   left breast  . Laryngopharyngeal reflux    sometimes food goes down wrong way and she ends up in extreme coughing/choking scenario  . Personal history of radiation therapy 1991   left breast ca  . Primary osteoarthritis   . Thoracic aortic aneurysm (TAA) [facility])     Patient Active Problem List   Diagnosis Date Noted  . Aortic atherosclerosis ([place]) 12/12/2019  . Axillary adenopathy 01/01/2019  . 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 Osteopenia of neck of right femur 07/12/2018  . Malignant neoplasm of upper-outer quadrant of left female breast ([place]) 07/07/2018  . Thoracic ascending aortic aneurysm ([place]) 05/27/2018  . LPRD (laryngopharyngeal reflux disease) 03/17/2018  . Chronic foot pain 11/25/2017  . Lumbar degenerative disc disease 09/24/2017  . Hx of  Lyme disease 09/24/2017  . Hx of basal cell carcinoma 09/24/2017  . Nuclear sclerotic cataract of left eye 08/09/2015  . Osteoporosis 07/04/2014  . Primary localized osteoarthrosis of shoulder region 12/13/2012  . Osteoarthritis 12/18/2011    Past Surgical History:  Procedure Laterality Date  . BREAST BIOPSY Right 02/22/2020   stereo bx, ribbon clip, path pending   . BREAST LUMPECTOMY Left 1991   breast ca, lumpectomy  . cataracts Bilateral   . CYSTOCELE REPAIR  2010  . EYE SURGERY Bilateral 2017   cataracts extracted  . JOINT REPLACEMENT    . MASTECTOMY Left 07/25/2018   Invasive Mammary Carcinoma  . MASTECTOMY W/ SENTINEL NODE BIOPSY Left 07/25/2018   Procedure: MASTECTOMY WITH SENTINEL LYMPH NODE BIOPSY;  Surgeon: Olean Ree, MD;  Location: [facility] ORS;  Service: General;  Laterality: Left;  . OOPHORECTOMY  1995   ovarian mass - benign  . REPLACEMENT TOTAL KNEE Bilateral 2012,2013  . SKIN BIOPSY Left    arm growth  . TONSILLECTOMY  1958  . TOTAL SHOULDER ARTHROPLASTY Right 2016    OB History   No obstetric history on file.      Home Medications    Prior to Admission medications   Medication Sig Start Date End Date Taking? Authorizing Provider  Calcium Carbonate-Vitamin D (CALCIUM-VITAMIN D) 600-125 MG-UNIT TABS Take 1 tablet by mouth 2 (two) times a day.  Yes [provider]  loratadine (CLARITIN REDITABS) 10 MG dissolvable tablet Take 10 mg by mouth at bedtime.   Yes [provider]  montelukast (SINGULAIR) 10 MG tablet TAKE 1 TABLET BY MOUTH EVERYDAY AT BEDTIME 06/26/20  Yes Glean Hess, MD  Multiple Vitamins-Minerals ([place] 50+) CAPS    Yes [provider]  omeprazole (PRILOSEC) 40 MG capsule TAKE 1 CAPSULE BY MOUTH EVERY DAY 03/24/20  Yes Glean Hess, MD  XARELTO 20 MG TABS tablet Take 20 mg by mouth at bedtime. 06/17/20  Yes [provider]    Family History Family History  Problem Relation Age of Onset   . Hypertension Mother   . Alzheimer's disease Mother   . Stroke Father   . Cancer Brother        prostate  . Atrial fibrillation Brother   . Cancer Other        breast  . Atrial fibrillation Brother   . Breast cancer Cousin 72       paternal x 2    Social History Social History   Tobacco Use  . Smoking status: Never Smoker  . Smokeless tobacco: Never Used  . Tobacco comment: smoking cessation materials not required  Vaping Use  . Vaping Use: Never used  Substance Use Topics  . Alcohol use: Yes    Alcohol/week: 2.0 standard drinks    Types: 1 Cans of beer, 1 Glasses of wine per week    Comment: occasional  . Drug use: Never     Allergies   Ciprofloxacin   Review of Systems Review of Systems  Constitutional: Positive for fever.  Gastrointestinal: Positive for abdominal pain, anal bleeding and blood in stool.   Physical Exam Triage Vital Signs ED Triage Vitals  Enc Vitals Group     BP 07/14/20 1444 (!) 136/97     Pulse Rate 07/14/20 1444 89     Resp 07/14/20 1444 18     Temp 07/14/20 1444 100.1 F (37.8 C)     Temp Source 07/14/20 1444 Oral     SpO2 07/14/20 1444 97 %     Weight 07/14/20 1439 169 lb (76.7 kg)     Height 07/14/20 1439 5\' 3"  (1.6 m)     Head Circumference --      Peak Flow --      Pain Score 07/14/20 1438 8     Pain Loc --      Pain Edu? --      Excl. in [place]? --    Updated Vital Signs BP (!) 136/97 (BP Location: Left Arm)   Pulse 89   Temp 100.1 F (37.8 C) (Oral)   Resp 18   Ht 5\' 3"  (1.6 m)   Wt 76.7 kg   SpO2 97%   BMI 29.94 kg/m   Visual Acuity Right Eye Distance:   Left Eye Distance:   Bilateral Distance:    Right Eye Near:   Left Eye Near:    Bilateral Near:     Physical Exam Constitutional:      General: She is not in acute distress.    Appearance: She is not ill-appearing.  HENT:     Head: Normocephalic and atraumatic.  Eyes:     General:        Right eye: No discharge.        Left eye: No discharge.      Conjunctiva/sclera: Conjunctivae normal.  Cardiovascular:     Rate and Rhythm: Normal  rate and regular rhythm.  Pulmonary:     Effort: Pulmonary effort is normal.     Breath sounds: Normal breath sounds. No wheezing, rhonchi or rales.  Abdominal:     General: There is no distension.     Palpations: Abdomen is soft.     Comments: Tender to palpation in the right lower quadrant as well as left lower quadrant.  Neurological:     Mental Status: She is alert.  Psychiatric:        Mood and Affect: Mood normal.        Behavior: Behavior normal.    UC Treatments / Results  Labs (all labs ordered are listed, but only abnormal results are displayed) Labs Reviewed  CBC WITH DIFFERENTIAL/PLATELET - Abnormal; Notable for the following components:      Result Value   WBC 11.8 (*)    Neutro Abs 9.5 (*)    All other components within normal limits  COMPREHENSIVE METABOLIC PANEL - Abnormal; Notable for the following components:   Sodium 134 (*)    Calcium 8.8 (*)    All other components within normal limits  LIPASE, BLOOD    EKG   Radiology CT ABDOMEN PELVIS WO CONTRAST  Result Date: 07/14/2020 CLINICAL DATA:  Abdominal pain and fever. EXAM: CT ABDOMEN AND PELVIS WITHOUT CONTRAST TECHNIQUE: Multidetector CT imaging of the abdomen and pelvis was performed following the standard protocol without IV contrast. COMPARISON:  None FINDINGS: Lower chest: There is no acute abnormality lower thorax.Again noted is aneurysmal dilatation of the thoracic aorta. Hepatobiliary: The liver is normal. Normal gallbladder.There is no biliary ductal dilation. Pancreas: Normal contours without ductal dilatation. No peripancreatic fluid collection. Spleen: Unremarkable. Adrenals/Urinary Tract: --Adrenal glands: Unremarkable. --Right kidney/ureter: There appears to be a large right-sided peripelvic cyst versus less likely a right-sided UPJ obstruction given the appearance was somewhat similar to CT dated December 05, 2018.  --Left kidney/ureter: There is a punctate nonobstructing stone in the upper pole. --Urinary bladder: Unremarkable. Stomach/Bowel: --Stomach/Duodenum: No hiatal hernia or other gastric abnormality. Normal duodenal course and caliber. --Small bowel: There appears to be some wall thickening in minimal dilatation of several small bowel loops in the low mid abdomen. There is adjacent mesenteric fat stranding and edema. --Colon: Rectosigmoid diverticulosis without acute inflammation. There is diffuse circumferential wall thickening of portions of the mid to distal rectum. --Appendix: Normal. Vascular/Lymphatic: Atherosclerotic calcification is present within the non-aneurysmal abdominal aorta, without hemodynamically significant stenosis. --No retroperitoneal lymphadenopathy. --No mesenteric lymphadenopathy. --No pelvic or inguinal lymphadenopathy. Reproductive: Unremarkable Other: There is a trace amount of free fluid the patient's pelvis. The abdominal wall is normal. Musculoskeletal. There are advanced degenerative changes throughout the visualized thoracolumbar spine. There is facet arthrosis in the lower lumbar segments. IMPRESSION: 1. There appears to be some wall thickening in minimal dilatation of several small bowel loops in the low mid abdomen with adjacent mesenteric fat stranding and edema. Findings may be secondary to an infectious or inflammatory enteritis. 2. There is diffuse circumferential wall thickening of portions of the mid to distal rectum. This may be secondary to proctitis. However, an underlying mass is not excluded and should be correlated with sigmoidoscopy/colonoscopy. 3. Rectosigmoid diverticulosis without acute inflammation. 4. Partially visualized ascending thoracic aortic aneurysm, better visualized on the patient's prior CT of the chest. 5. Probable large right-sided peripelvic cysts versus less likely hydronephrosis with a right UPJ obstruction. This is not well evaluated in the absence  of IV contrast. 6. Nonobstructing left-sided nephrolithiasis. Aortic Atherosclerosis (  ICD10-I70.0). Electronically Signed   By: Constance Holster M.D.   On: 07/14/2020 15:54    Procedures Procedures (including critical care time)  Medications Ordered in UC Medications - No data to display  Initial Impression / Assessment and Plan / UC Course  I have reviewed the triage vital signs and the nursing notes.  Pertinent labs & imaging results that were available during my care of the patient were reviewed by me and considered in my medical decision making (see chart for details).    87 year old female presents with fever, rectal bleeding, and abdominal pain.  Mildly elevated white count of 11.8.  Platelet count abnormal due to clumping.  Metabolic panel unremarkable.  CT scan revealed infectious versus inflammatory enteritis.  She also has proctitis.  Colonoscopy or sigmoidoscopy recommended.  Patient is in need of IV antibiotics and hospital admission.  She is going to the hospital via EMS.  Final Clinical Impressions(s) / UC Diagnoses   Final diagnoses:  Infectious colitis  Proctitis  Rectal bleeding   Discharge Instructions   None    ED Prescriptions    None     PDMP not reviewed this encounter.   Coral Spikes, [place] 07/14/20 [phone]

## 2020-07-14 NOTE — ED Notes (Signed)
Patient is being discharged from the Urgent Care and sent to the [facility] Department via [facility]. Per Dr. Lacinda Axon, patient is in need of higher level of care due to CT results. Patient is aware and verbalizes understanding of plan of care.  Vitals:   07/14/20 1444  BP: (!) 136/97  Pulse: 89  Resp: 18  Temp: 100.1 F (37.8 C)  SpO2: 97%

## 2020-07-14 NOTE — ED Triage Notes (Signed)
Pt c/o diarrhea with mucus that started about 2 days ago. Pt states it has not improved and now has some bright red blood in it. Pt does wear pads and reports some blood on those. Pt states she also has abdominal pain and fever (100.5 today). She has not taken any medications for her symptoms.

## 2020-07-14 NOTE — Consult Note (Signed)
PHARMACY -  BRIEF ANTIBIOTIC NOTE   Pharmacy has received consult(s) for Zosyn from an ED provider.  The patient's profile has been reviewed for ht/wt/allergies/indication/available labs.    One time order(s) placed for Zosyn 3.375g IV  Further antibiotics/pharmacy consults should be ordered by admitting physician if indicated.                       Sherilyn Banker, PharmD Pharmacy Resident  07/14/2020 5:13 PM

## 2020-07-14 NOTE — ED Provider Notes (Signed)
[facility] Emergency Department Provider Note  ____________________________________________   Event Date/Time   First MD Initiated Contact with Patient 07/14/20 1656     (approximate)  I have reviewed the triage vital signs and the nursing notes.   HISTORY  Chief Complaint Rectal Bleeding    HPI Connie Mitchell is a 87 y.o. female with history of A. Fib, GERD, here with diarrhea, weakness.  The patient states that for the last 3 days, she has had intermittent lower abdominal cramping, nausea, and diarrhea.  Her diarrhea has been mucoid and occasionally bloody.  She is on Xarelto for A. fib.  She went to urgent care and had a CT scan showing enteritis and was sent here for further evaluation.  She states she feels generally weak and tired.  She has had poor appetite.  No ongoing abdominal pain.  The pain is intermittent and cramping.  No urinary symptoms.  No vomiting.  No specific alleviating factors.  Pain is worse with bowel movements.  She also feels like she has to have more frequent bowel movements.        Past Medical History:  Diagnosis Date  . A-fib ([place])   . Cancer ([place]) 1991   left breast. treated with rads and lumpectomy  . Cancer ([place]) 2020   newly diagnosed, also left breast  . Cellulitis and abscess of toe of left foot 01/2018  . GERD (gastroesophageal reflux disease)   . Hx of therapeutic radiation 1991   left breast  . Laryngopharyngeal reflux    sometimes food goes down wrong way and she ends up in extreme coughing/choking scenario  . Personal history of radiation therapy 1991   left breast ca  . Primary osteoarthritis   . Thoracic aortic aneurysm (TAA) [facility])     Patient Active Problem List   Diagnosis Date Noted  . Aortic atherosclerosis ([place]) 12/12/2019  . Axillary adenopathy 01/01/2019  . 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 Osteopenia of neck of right femur 07/12/2018  . Malignant neoplasm of upper-outer quadrant of left  female breast ([place]) 07/07/2018  . Thoracic ascending aortic aneurysm ([place]) 05/27/2018  . LPRD (laryngopharyngeal reflux disease) 03/17/2018  . Chronic foot pain 11/25/2017  . Lumbar degenerative disc disease 09/24/2017  . Hx of Lyme disease 09/24/2017  . Hx of basal cell carcinoma 09/24/2017  . Nuclear sclerotic cataract of left eye 08/09/2015  . Osteoporosis 07/04/2014  . Primary localized osteoarthrosis of shoulder region 12/13/2012  . Osteoarthritis 12/18/2011    Past Surgical History:  Procedure Laterality Date  . BREAST BIOPSY Right 02/22/2020   stereo bx, ribbon clip, path pending   . BREAST LUMPECTOMY Left 1991   breast ca, lumpectomy  . cataracts Bilateral   . CYSTOCELE REPAIR  2010  . EYE SURGERY Bilateral 2017   cataracts extracted  . JOINT REPLACEMENT    . MASTECTOMY Left 07/25/2018   Invasive Mammary Carcinoma  . MASTECTOMY W/ SENTINEL NODE BIOPSY Left 07/25/2018   Procedure: MASTECTOMY WITH SENTINEL LYMPH NODE BIOPSY;  Surgeon: Olean Ree, MD;  Location: [facility] ORS;  Service: General;  Laterality: Left;  . OOPHORECTOMY  1995   ovarian mass - benign  . REPLACEMENT TOTAL KNEE Bilateral 2012,2013  . SKIN BIOPSY Left    arm growth  . TONSILLECTOMY  1958  . TOTAL SHOULDER ARTHROPLASTY Right 2016    Prior to Admission medications   Medication Sig Start Date End Date Taking? Authorizing Provider  Calcium Carbonate-Vitamin D (CALCIUM-VITAMIN D) 600-125 MG-UNIT  TABS Take 1 tablet by mouth 2 (two) times a day.     [provider]  loratadine (CLARITIN REDITABS) 10 MG dissolvable tablet Take 10 mg by mouth at bedtime.    [provider]  montelukast (SINGULAIR) 10 MG tablet TAKE 1 TABLET BY MOUTH EVERYDAY AT BEDTIME 06/26/20   Glean Hess, MD  Multiple Vitamins-Minerals ([place] 50+) CAPS     [provider]  omeprazole (PRILOSEC) 40 MG capsule TAKE 1 CAPSULE BY MOUTH EVERY DAY 03/24/20   Glean Hess, MD  XARELTO 20 MG TABS  tablet Take 20 mg by mouth at bedtime. 06/17/20   [provider]    Allergies Ciprofloxacin  Family History  Problem Relation Age of Onset  . Hypertension Mother   . Alzheimer's disease Mother   . Stroke Father   . Cancer Brother        prostate  . Atrial fibrillation Brother   . Cancer Other        breast  . Atrial fibrillation Brother   . Breast cancer Cousin 72       paternal x 2    Social History Social History   Tobacco Use  . Smoking status: Never Smoker  . Smokeless tobacco: Never Used  . Tobacco comment: smoking cessation materials not required  Vaping Use  . Vaping Use: Never used  Substance Use Topics  . Alcohol use: Yes    Alcohol/week: 2.0 standard drinks    Types: 1 Cans of beer, 1 Glasses of wine per week    Comment: occasional  . Drug use: Never    Review of Systems  Review of Systems  Constitutional: Positive for fatigue. Negative for fever.  HENT: Negative for congestion and sore throat.   Eyes: Negative for visual disturbance.  Respiratory: Negative for cough and shortness of breath.   Cardiovascular: Negative for chest pain.  Gastrointestinal: Positive for abdominal pain, blood in stool and diarrhea. Negative for nausea and vomiting.  Genitourinary: Negative for flank pain.  Musculoskeletal: Negative for back pain and neck pain.  Skin: Negative for rash and wound.  Neurological: Negative for weakness.  All other systems reviewed and are negative.    ____________________________________________  PHYSICAL EXAM:      VITAL SIGNS: ED Triage Vitals  Enc Vitals Group     BP 07/14/20 1654 (!) 156/88     Pulse Rate 07/14/20 1654 83     Resp 07/14/20 1654 18     Temp 07/14/20 1654 99.7 F (37.6 C)     Temp Source 07/14/20 1654 Oral     SpO2 07/14/20 1654 98 %     Weight 07/14/20 1655 169 lb (76.7 kg)     Height 07/14/20 1655 5\' 3"  (1.6 m)     Head Circumference --      Peak Flow --      Pain Score 07/14/20 1655 0     Pain Loc  --      Pain Edu? --      Excl. in [place]? --      Physical Exam Vitals and nursing note reviewed.  Constitutional:      General: She is not in acute distress.    Appearance: She is well-developed.  HENT:     Head: Normocephalic and atraumatic.  Eyes:     Conjunctiva/sclera: Conjunctivae normal.  Cardiovascular:     Rate and Rhythm: Normal rate and regular rhythm.     Heart sounds: Normal heart sounds. No  murmur heard. No friction rub.  Pulmonary:     Effort: Pulmonary effort is normal. No respiratory distress.     Breath sounds: Normal breath sounds. No wheezing or rales.  Abdominal:     General: Abdomen is flat. There is no distension.     Palpations: Abdomen is soft.     Tenderness: There is abdominal tenderness (Mild, primarily lower). There is no guarding or rebound.  Musculoskeletal:     Cervical back: Neck supple.  Skin:    General: Skin is warm.     Capillary Refill: Capillary refill takes less than 2 seconds.  Neurological:     Mental Status: She is alert and oriented to person, place, and time.     Motor: No abnormal muscle tone.       ____________________________________________   LABS (all labs ordered are listed, but only abnormal results are displayed)  Labs Reviewed  CULTURE, BLOOD (ROUTINE X 2)  CULTURE, BLOOD (ROUTINE X 2)  SARS CORONAVIRUS 2 (TAT 6-24 HRS)  LACTIC ACID, PLASMA  LACTIC ACID, PLASMA  TYPE AND SCREEN  TYPE AND SCREEN    ____________________________________________  EKG:  ________________________________________  RADIOLOGY All imaging, including plain films, CT scans, and ultrasounds, independently reviewed by me, and interpretations confirmed via formal radiology reads.  ED MD interpretation:   CT abdomen/pelvis: Reviewed, consistent with inflammatory enteritis and likely proctitis  Official radiology report(s): CT ABDOMEN PELVIS WO CONTRAST  Result Date: 07/14/2020 CLINICAL DATA:  Abdominal pain and fever. EXAM: CT  ABDOMEN AND PELVIS WITHOUT CONTRAST TECHNIQUE: Multidetector CT imaging of the abdomen and pelvis was performed following the standard protocol without IV contrast. COMPARISON:  None FINDINGS: Lower chest: There is no acute abnormality lower thorax.Again noted is aneurysmal dilatation of the thoracic aorta. Hepatobiliary: The liver is normal. Normal gallbladder.There is no biliary ductal dilation. Pancreas: Normal contours without ductal dilatation. No peripancreatic fluid collection. Spleen: Unremarkable. Adrenals/Urinary Tract: --Adrenal glands: Unremarkable. --Right kidney/ureter: There appears to be a large right-sided peripelvic cyst versus less likely a right-sided UPJ obstruction given the appearance was somewhat similar to CT dated December 05, 2018. --Left kidney/ureter: There is a punctate nonobstructing stone in the upper pole. --Urinary bladder: Unremarkable. Stomach/Bowel: --Stomach/Duodenum: No hiatal hernia or other gastric abnormality. Normal duodenal course and caliber. --Small bowel: There appears to be some wall thickening in minimal dilatation of several small bowel loops in the low mid abdomen. There is adjacent mesenteric fat stranding and edema. --Colon: Rectosigmoid diverticulosis without acute inflammation. There is diffuse circumferential wall thickening of portions of the mid to distal rectum. --Appendix: Normal. Vascular/Lymphatic: Atherosclerotic calcification is present within the non-aneurysmal abdominal aorta, without hemodynamically significant stenosis. --No retroperitoneal lymphadenopathy. --No mesenteric lymphadenopathy. --No pelvic or inguinal lymphadenopathy. Reproductive: Unremarkable Other: There is a trace amount of free fluid the patient's pelvis. The abdominal wall is normal. Musculoskeletal. There are advanced degenerative changes throughout the visualized thoracolumbar spine. There is facet arthrosis in the lower lumbar segments. IMPRESSION: 1. There appears to be some wall  thickening in minimal dilatation of several small bowel loops in the low mid abdomen with adjacent mesenteric fat stranding and edema. Findings may be secondary to an infectious or inflammatory enteritis. 2. There is diffuse circumferential wall thickening of portions of the mid to distal rectum. This may be secondary to proctitis. However, an underlying mass is not excluded and should be correlated with sigmoidoscopy/colonoscopy. 3. Rectosigmoid diverticulosis without acute inflammation. 4. Partially visualized ascending thoracic aortic aneurysm, better visualized on the patient's prior  CT of the chest. 5. Probable large right-sided peripelvic cysts versus less likely hydronephrosis with a right UPJ obstruction. This is not well evaluated in the absence of IV contrast. 6. Nonobstructing left-sided nephrolithiasis. Aortic Atherosclerosis (ICD10-I70.0). Electronically Signed   By: Constance Holster M.D.   On: 07/14/2020 15:54    ____________________________________________  PROCEDURES   Procedure(s) performed (including Critical Care):  Procedures  ____________________________________________  INITIAL IMPRESSION / MDM / [place] / ED COURSE  As part of my medical decision making, I reviewed the following data within the [facility] notes reviewed and incorporated, Old chart reviewed, Notes from prior ED visits, and [place] Controlled Substance Database       *Yvaine Schrade was evaluated in Emergency Department on 07/14/2020 for the symptoms described in the history of present illness. She was evaluated in the context of the global COVID-19 pandemic, which necessitated consideration that the patient might be at risk for infection with the SARS-CoV-2 virus that causes COVID-19. Institutional protocols and algorithms that pertain to the evaluation of patients at risk for COVID-19 are in a state of rapid change based on information released by regulatory bodies including  the CDC and federal and state organizations. These policies and algorithms were followed during the patient's care in the ED.  Some ED evaluations and interventions may be delayed as a result of limited staffing during the pandemic.*     Medical Decision Making:  87 yo F with PMHx above here with abd pain, nausea, vomiting, diarrhea. Afebrile, HDS. Well appearing and in NAD. Abd diffusely tender. Reviewed labs from UC visit - moderate leukocytosis, hgb normal, CT scan shows enteritis and proctitis. Pt on Xarelto with intermittent bloody stool. Will admit for abx, fluids, monitoring.   ____________________________________________  FINAL CLINICAL IMPRESSION(S) / ED DIAGNOSES  Final diagnoses:  Enteritis  Proctitis     MEDICATIONS GIVEN DURING THIS VISIT:  Medications  0.9 %  sodium chloride infusion (has no administration in time range)  piperacillin-tazobactam (ZOSYN) IVPB 3.375 g (3.375 g Intravenous New Bag/Given 07/14/20 1807)     ED Discharge Orders    None       Note:  This document was prepared using Dragon voice recognition software and may include unintentional dictation errors.   Duffy Bruce, MD 07/14/20 [phone]

## 2020-07-14 NOTE — Consult Note (Signed)
Pharmacy Antibiotic Note  Connie Mitchell is a 87 y.o. female admitted on 07/14/2020 with rectal bleeding and intermittent lower abdominal cramping, nausea, and diarrhea x 3 days. She went to urgent care and had a CT scan showing enteritis and was sent here for further evaluation. Pharmacy has been consulted for Zosyn dosing for intra-abdominal infection.  Plan: Zosyn 3.375g IV q8h (4 hour infusion).  Height: 5\' 3"  (160 cm) Weight: 76.7 kg (169 lb) IBW/kg (Calculated) : 52.4  Temp (24hrs), Avg:99.9 F (37.7 C), Min:99.7 F (37.6 C), Max:100.1 F (37.8 C)  Recent Labs  Lab 07/14/20 1525 07/14/20 1709  WBC 11.8*  --   CREATININE 0.62  --   LATICACIDVEN  --  1.1    Estimated Creatinine Clearance: 51.3 mL/min (by C-G formula based on SCr of 0.62 mg/dL).    Allergies  Allergen Reactions  . Ciprofloxacin Nausea And Vomiting    Antimicrobials this admission: Zosyn >> 1/30 >    Dose adjustments this admission: n/a  Microbiology results: 1/30 BCx: pending   Thank you for allowing pharmacy to be a part of this patient's care.  Dorothe Pea, PharmD, BCPS Clinical Pharmacist  07/14/2020 8:11 PM

## 2020-07-15 ENCOUNTER — Encounter: Payer: Self-pay | Admitting: Family Medicine

## 2020-07-15 DIAGNOSIS — K529 Noninfective gastroenteritis and colitis, unspecified: Secondary | ICD-10-CM | POA: Diagnosis not present

## 2020-07-15 LAB — GASTROINTESTINAL PANEL BY PCR, STOOL (REPLACES STOOL CULTURE)

## 2020-07-15 LAB — CBC
HCT: 35.4 % — ABNORMAL LOW (ref 36.0–46.0)
Hemoglobin: 12.4 g/dL (ref 12.0–15.0)
MCH: 31.9 pg (ref 26.0–34.0)
MCHC: 35 g/dL (ref 30.0–36.0)
MCV: 91 fL (ref 80.0–100.0)
Platelets: UNDETERMINED 10*3/uL (ref 150–400)
RBC: 3.89 MIL/uL (ref 3.87–5.11)
RDW: 15 % (ref 11.5–15.5)
WBC: 9.8 10*3/uL (ref 4.0–10.5)
nRBC: 0 % (ref 0.0–0.2)

## 2020-07-15 LAB — BASIC METABOLIC PANEL
Anion gap: 8 (ref 5–15)
BUN: 11 mg/dL (ref 8–23)
CO2: 24 mmol/L (ref 22–32)
Calcium: 7.8 mg/dL — ABNORMAL LOW (ref 8.9–10.3)
Chloride: 108 mmol/L (ref 98–111)
Creatinine, Ser: 0.56 mg/dL (ref 0.44–1.00)
GFR, Estimated: >60 mL/min (ref >60)
Glucose, Bld: 102 mg/dL — ABNORMAL HIGH (ref 70–99)
Potassium: 3.3 mmol/L — ABNORMAL LOW (ref 3.5–5.1)
Sodium: 140 mmol/L (ref 135–145)

## 2020-07-15 LAB — C DIFFICILE QUICK SCREEN W PCR REFLEX
C Diff antigen: NEGATIVE
C Diff interpretation: NOT DETECTED
C Diff toxin: NEGATIVE

## 2020-07-15 LAB — SARS CORONAVIRUS 2 (TAT 6-24 HRS): SARS Coronavirus 2: NEGATIVE

## 2020-07-15 MED ORDER — KCL-LACTATED RINGERS 20 MEQ/L IV SOLN
INTRAVENOUS | Status: DC
  Filled 2020-07-15 (×2): qty 1000

## 2020-07-15 MED ORDER — NITROGLYCERIN 0.4 MG SL SUBL: 0.4000 mg | SUBLINGUAL_TABLET | SUBLINGUAL | Status: DC | PRN

## 2020-07-15 MED ORDER — RIVAROXABAN 20 MG PO TABS
20.0000 mg | ORAL_TABLET | Freq: Every day | ORAL | Status: DC
  Administered 2020-07-16: 20 mg via ORAL
  Filled 2020-07-15 (×5): qty 1

## 2020-07-15 MED ORDER — POTASSIUM CHLORIDE 2 MEQ/ML IV SOLN
INTRAVENOUS | Status: DC
  Filled 2020-07-15 (×5): qty 1000

## 2020-07-15 MED ORDER — ONDANSETRON HCL 4 MG/2ML IJ SOLN: 4.0000 mg | Freq: Four times a day (QID) | INTRAMUSCULAR | Status: DC | PRN

## 2020-07-15 MED ORDER — LORATADINE 10 MG PO TABS
10.0000 mg | ORAL_TABLET | Freq: Every day | ORAL | Status: DC
  Filled 2020-07-15: qty 1

## 2020-07-15 MED ORDER — MONTELUKAST SODIUM 10 MG PO TABS
10.0000 mg | ORAL_TABLET | Freq: Every day | ORAL | Status: DC
  Administered 2020-07-15: 10 mg via ORAL
  Filled 2020-07-15: qty 1

## 2020-07-15 MED ORDER — MORPHINE SULFATE (PF) 2 MG/ML IV SOLN: 2.0000 mg | INTRAVENOUS | Status: DC | PRN

## 2020-07-15 MED ORDER — PANTOPRAZOLE SODIUM 40 MG PO TBEC
40.0000 mg | DELAYED_RELEASE_TABLET | Freq: Every day | ORAL | Status: DC
  Administered 2020-07-15 – 2020-07-16 (×2): 40 mg via ORAL
  Filled 2020-07-15 (×3): qty 1

## 2020-07-15 MED ORDER — DICYCLOMINE HCL 20 MG PO TABS
20.0000 mg | ORAL_TABLET | Freq: Three times a day (TID) | ORAL | Status: DC | PRN
  Filled 2020-07-15: qty 1

## 2020-07-15 MED ORDER — OXYCODONE HCL 5 MG PO TABS: 5.0000 mg | ORAL_TABLET | ORAL | Status: DC | PRN

## 2020-07-15 NOTE — ED Notes (Signed)
Pt told Rn that she had an episode of chest pain earlier described it as cramp. Denied SOB states the pain came and went. [facility] notified.

## 2020-07-15 NOTE — ED Notes (Signed)
Move to room 35.  Ambulated with her walker into room to get on hospital bed.  Instructed on call bell use.  Says no pain right now, but she does get abdominal pain on and off if she moves or goes to bathroom.

## 2020-07-15 NOTE — ED Notes (Signed)
Pt given meal tray.

## 2020-07-15 NOTE — ED Notes (Signed)
Pt given a warm blanket 

## 2020-07-15 NOTE — Progress Notes (Signed)
PROGRESS NOTE    Connie Mitchell  [id number] DOB: 02-29-1934 DOA: 07/14/2020 PCP: Glean Hess, MD   Brief Narrative: 87 y.o. female with medical history significant for atrial fibrillation/flutter on Xarelto, breast cancer with recurrent s/p radiation and left mastectomy, thoracic aortic aneurysm, and osteoporosis who presents with concerns of rectal bleeding.  Patient presented to urgent care today with concerns of rectal bleeding since she is on Xarelto.  She sees blood only when wiping.  She says for the past 3 days she has been having lower abdominal pain that is worse with movement and has been having mucousy stool.  Had a fever up to 100.5 today.  She is still able to eat and drink.  Denies any nausea, vomiting.  No sick contact or recent travel.  Has history of oophorectomy due to a cyst.  She endorsed that when she was in her 75s she was diagnosed with ulcerative colitis and was on sulfasalazine for a year to and was told to discontinue.  Following morning patient reports stability in her symptoms.  No significant improvement or deterioration noted.  Patient is remained hemodynamically stable and afebrile.   Assessment & Plan:   Principal Problem:   Enteritis Active Problems:   Malignant neoplasm of upper-outer quadrant of left female breast (HCC)   Rectal bleed   Unspecified atrial fibrillation (HCC)  Enteritis/proctitis Evidence of inflammation seen on CT abdomen Unlikely to represent recurrence of ulcerative colitis Plan: Continue IV Zosyn Continue clear liquid diet Continue intravenous fluids Pain control as needed We will consider GI consult if patient is not improving Continue IV Zosyn  Rectal bleed Suspect this is mild due to mucosal injury from inflammation No evidence of hemodynamically significant bleed Hemoglobin stable  Will continue with Xarelto at this time  History of atrial fibrillation/flutter Continue Xarelto  History of breast  cancer Continue follow-up with oncology   DVT prophylaxis: Xarelto Code Status: Full Family Communication: None today.  Offered to call but patient declined Disposition Plan: Status is: Inpatient  Remains inpatient appropriate because:Inpatient level of care appropriate due to severity of illness   Dispo: The patient is from: Home              Anticipated d/c is to: Home              Anticipated d/c date is: 2 days              Patient currently is not medically stable to d/c.   Difficult to place patient No  Patient with relatively mild enteritis/proctitis.  On IV antibiotics.  Anticipate 48 additional hours before disposition.  Patient needs to be off IV antibiotics and tolerating p.o. intake before discharge     Level of care: Med-Surg  Consultants:   None  Procedures:   None  Antimicrobials:   Zosyn   Subjective: Patient seen and examined.  Reports consistent symptoms since admission.  No worsening or improvement.  No other complaints  Objective: Vitals:   07/15/20 0400 07/15/20 0546 07/15/20 0900 07/15/20 1340  BP: 108/74 121/80 133/81 129/75  Pulse: 87 86 87 81  Resp: (!) 22 18 (!) 26 (!) 24  Temp:  98.7 F (37.1 C)    TempSrc:  Oral    SpO2: 97% 96% 98% 100%  Weight:      Height:       No intake or output data in the 24 hours ending 07/15/20 1348 Filed Weights   07/14/20 1655  Weight: 76.7 kg  Examination:  General exam: Appears calm and comfortable  Respiratory system: Clear to auscultation. Respiratory effort normal. Cardiovascular system: S1 & S2 heard, RRR. No JVD, murmurs, rubs, gallops or clicks. No pedal edema. Gastrointestinal system: Abdomen soft, nondistended, mild tender to palpation suprapubic region and left lower quadrant.  Normal bowel sounds Central nervous system: Alert and oriented. No focal neurological deficits. Extremities: Symmetric 5 x 5 power. Skin: No rashes, lesions or ulcers Psychiatry: Judgement and insight  appear normal. Mood & affect appropriate.     Data Reviewed: I have personally reviewed following labs and imaging studies  CBC: Recent Labs  Lab 07/14/20 1525 07/15/20 0547  WBC 11.8* 9.8  NEUTROABS 9.5*  --   HGB 14.3 12.4  HCT 42.8 35.4*  MCV 91.5 91.0  PLT ACLUMP PLATELET CLUMPS NOTED ON SMEAR, UNABLE TO ESTIMATE   Basic Metabolic Panel: Recent Labs  Lab 07/14/20 1525 07/15/20 0547  NA 134* 140  K 3.9 3.3*  CL 99 108  CO2 25 24  GLUCOSE 99 102*  BUN 15 11  CREATININE 0.62 0.56  CALCIUM 8.8* 7.8*   GFR: Estimated Creatinine Clearance: 51.3 mL/min (by C-G formula based on SCr of 0.56 mg/dL). Liver Function Tests: Recent Labs  Lab 07/14/20 1525  AST 21  ALT 25  ALKPHOS 57  BILITOT 0.6  PROT 7.1  ALBUMIN 3.7   Recent Labs  Lab 07/14/20 1525  LIPASE 30   No results for input(s): AMMONIA in the last 168 hours. Coagulation Profile: No results for input(s): INR, PROTIME in the last 168 hours. Cardiac Enzymes: No results for input(s): CKTOTAL, CKMB, CKMBINDEX, TROPONINI in the last 168 hours. BNP (last 3 results) No results for input(s): PROBNP in the last 8760 hours. HbA1C: No results for input(s): HGBA1C in the last 72 hours. CBG: No results for input(s): GLUCAP in the last 168 hours. Lipid Profile: No results for input(s): CHOL, HDL, LDLCALC, TRIG, CHOLHDL, LDLDIRECT in the last 72 hours. Thyroid Function Tests: No results for input(s): TSH, T4TOTAL, FREET4, T3FREE, THYROIDAB in the last 72 hours. Anemia Panel: No results for input(s): VITAMINB12, FOLATE, FERRITIN, TIBC, IRON, RETICCTPCT in the last 72 hours. Sepsis Labs: Recent Labs  Lab 07/14/20 1709 07/14/20 2104  LATICACIDVEN 1.1 1.4    Recent Results (from the past 240 hour(s))  Blood culture (routine x 2)     Status: None (Preliminary result)   Collection Time: 07/14/20  5:09 PM   Specimen: BLOOD RIGHT FOREARM  Result Value Ref Range Status   Specimen Description BLOOD RIGHT FOREARM   Final   Special Requests   Final    BOTTLES DRAWN AEROBIC AND ANAEROBIC Blood Culture adequate volume   Culture   Final    NO GROWTH < 24 HOURS Performed at [facility], [address] [place], [place] [address]    Report Status PENDING  Incomplete  SARS CORONAVIRUS 2 (TAT 6-24 HRS) Nasopharyngeal Nasopharyngeal Swab     Status: None   Collection Time: 07/14/20  8:27 PM   Specimen: Nasopharyngeal Swab  Result Value Ref Range Status   SARS Coronavirus 2 NEGATIVE NEGATIVE Final    Comment: (NOTE) SARS-CoV-2 target nucleic acids are NOT DETECTED.  The SARS-CoV-2 RNA is generally detectable in upper and lower respiratory specimens during the acute phase of infection. Negative results do not preclude SARS-CoV-2 infection, do not rule out co-infections with other pathogens, and should not be used as the sole basis for treatment or other patient management decisions. Negative results must  be combined with clinical observations, patient history, and epidemiological information. The expected result is Negative.  Fact Sheet for Patients: SugarRoll.be  Fact Sheet for Healthcare Providers: https://www.woods-mathews.com/  This test is not yet approved or cleared by the [place] FDA and  has been authorized for detection and/or diagnosis of SARS-CoV-2 by FDA under an Emergency Use Authorization (EUA). This EUA will remain  in effect (meaning this test can be used) for the duration of the COVID-19 declaration under Se ction 564(b)(1) of the Act, 21 U.S.C. section 360bbb-3(b)(1), unless the authorization is terminated or revoked sooner.  Performed at [facility], [place] [address] [place], [place] [address]   Blood culture (routine x 2)     Status: None (Preliminary result)   Collection Time: 07/14/20  9:04 PM   Specimen: BLOOD  Result Value Ref Range Status   Specimen Description BLOOD BLOOD LEFT WRIST  Final   Special  Requests   Final    BOTTLES DRAWN AEROBIC AND ANAEROBIC Blood Culture adequate volume   Culture   Final    NO GROWTH < 12 HOURS Performed at [facility], [address] [place], [place] [address]    Report Status PENDING  Incomplete  Gastrointestinal Panel by PCR , Stool     Status: None   Collection Time: 07/15/20  9:29 AM   Specimen: Stool  Result Value Ref Range Status   Campylobacter species NOT DETECTED NOT DETECTED Final   Plesimonas shigelloides NOT DETECTED NOT DETECTED Final   Salmonella species NOT DETECTED NOT DETECTED Final   Yersinia enterocolitica NOT DETECTED NOT DETECTED Final   Vibrio species NOT DETECTED NOT DETECTED Final   Vibrio cholerae NOT DETECTED NOT DETECTED Final   Enteroaggregative E coli (EAEC) NOT DETECTED NOT DETECTED Final   Enteropathogenic E coli (EPEC) NOT DETECTED NOT DETECTED Final   Enterotoxigenic E coli (ETEC) NOT DETECTED NOT DETECTED Final   Shiga like toxin producing E coli (STEC) NOT DETECTED NOT DETECTED Final   Shigella/Enteroinvasive E coli (EIEC) NOT DETECTED NOT DETECTED Final   Cryptosporidium NOT DETECTED NOT DETECTED Final   Cyclospora cayetanensis NOT DETECTED NOT DETECTED Final   Entamoeba histolytica NOT DETECTED NOT DETECTED Final   Giardia lamblia NOT DETECTED NOT DETECTED Final   Adenovirus F40/41 NOT DETECTED NOT DETECTED Final   Astrovirus NOT DETECTED NOT DETECTED Final   Norovirus GI/GII NOT DETECTED NOT DETECTED Final   Rotavirus A NOT DETECTED NOT DETECTED Final   Sapovirus (I, II, IV, and V) NOT DETECTED NOT DETECTED Final    Comment: Performed at [facility], [address] [place], [place] [address]  C Difficile Quick Screen w PCR reflex     Status: None   Collection Time: 07/15/20  9:29 AM   Specimen: Stool  Result Value Ref Range Status   C Diff antigen NEGATIVE NEGATIVE Final   C Diff toxin NEGATIVE NEGATIVE Final   C Diff interpretation No C. difficile detected.  Final    Comment:  Performed at [facility], [address] [place], [place] [address]         Radiology Studies: CT ABDOMEN PELVIS WO CONTRAST  Result Date: 07/14/2020 CLINICAL DATA:  Abdominal pain and fever. EXAM: CT ABDOMEN AND PELVIS WITHOUT CONTRAST TECHNIQUE: Multidetector CT imaging of the abdomen and pelvis was performed following the standard protocol without IV contrast. COMPARISON:  None FINDINGS: Lower chest: There is no acute abnormality lower thorax.Again noted is aneurysmal dilatation of the thoracic  aorta. Hepatobiliary: The liver is normal. Normal gallbladder.There is no biliary ductal dilation. Pancreas: Normal contours without ductal dilatation. No peripancreatic fluid collection. Spleen: Unremarkable. Adrenals/Urinary Tract: --Adrenal glands: Unremarkable. --Right kidney/ureter: There appears to be a large right-sided peripelvic cyst versus less likely a right-sided UPJ obstruction given the appearance was somewhat similar to CT dated December 05, 2018. --Left kidney/ureter: There is a punctate nonobstructing stone in the upper pole. --Urinary bladder: Unremarkable. Stomach/Bowel: --Stomach/Duodenum: No hiatal hernia or other gastric abnormality. Normal duodenal course and caliber. --Small bowel: There appears to be some wall thickening in minimal dilatation of several small bowel loops in the low mid abdomen. There is adjacent mesenteric fat stranding and edema. --Colon: Rectosigmoid diverticulosis without acute inflammation. There is diffuse circumferential wall thickening of portions of the mid to distal rectum. --Appendix: Normal. Vascular/Lymphatic: Atherosclerotic calcification is present within the non-aneurysmal abdominal aorta, without hemodynamically significant stenosis. --No retroperitoneal lymphadenopathy. --No mesenteric lymphadenopathy. --No pelvic or inguinal lymphadenopathy. Reproductive: Unremarkable Other: There is a trace amount of free fluid the patient's pelvis. The  abdominal wall is normal. Musculoskeletal. There are advanced degenerative changes throughout the visualized thoracolumbar spine. There is facet arthrosis in the lower lumbar segments. IMPRESSION: 1. There appears to be some wall thickening in minimal dilatation of several small bowel loops in the low mid abdomen with adjacent mesenteric fat stranding and edema. Findings may be secondary to an infectious or inflammatory enteritis. 2. There is diffuse circumferential wall thickening of portions of the mid to distal rectum. This may be secondary to proctitis. However, an underlying mass is not excluded and should be correlated with sigmoidoscopy/colonoscopy. 3. Rectosigmoid diverticulosis without acute inflammation. 4. Partially visualized ascending thoracic aortic aneurysm, better visualized on the patient's prior CT of the chest. 5. Probable large right-sided peripelvic cysts versus less likely hydronephrosis with a right UPJ obstruction. This is not well evaluated in the absence of IV contrast. 6. Nonobstructing left-sided nephrolithiasis. Aortic Atherosclerosis (ICD10-I70.0). Electronically Signed   By: Constance Holster M.D.   On: 07/14/2020 15:54        Scheduled Meds: . pantoprazole  40 mg Oral Daily  . rivaroxaban  20 mg Oral QHS   Continuous Infusions: . lactated ringers with kcl 75 mL/hr at 07/15/20 1039  . piperacillin-tazobactam (ZOSYN)  IV 3.375 g (07/15/20 1336)     LOS: 1 day    Time spent: 25 minutes    Sidney Ace, MD [facility] Pager [phone]  If 7PM-7AM, please contact night-coverage 07/15/2020, 1:48 PM

## 2020-07-15 NOTE — ED Notes (Signed)
Md made aware that patient had episode of chest cramping pain.

## 2020-07-15 NOTE — ED Notes (Signed)
Sunquest not working. bloodwork collected and sent to lab with patient label sticker.

## 2020-07-15 NOTE — ED Notes (Signed)
Searched on Micromedex to test compatibility of IV fluids. States that LR with KCl is compatible with Zosyn

## 2020-07-16 DIAGNOSIS — K529 Noninfective gastroenteritis and colitis, unspecified: Secondary | ICD-10-CM | POA: Diagnosis not present

## 2020-07-16 LAB — CBC
HCT: 36.5 % (ref 36.0–46.0)
Hemoglobin: 12.7 g/dL (ref 12.0–15.0)
MCH: 31.8 pg (ref 26.0–34.0)
MCHC: 34.8 g/dL (ref 30.0–36.0)
MCV: 91.5 fL (ref 80.0–100.0)
Platelets: UNDETERMINED 10*3/uL (ref 150–400)
RBC: 3.99 MIL/uL (ref 3.87–5.11)
RDW: 14.8 % (ref 11.5–15.5)
WBC: 9.1 10*3/uL (ref 4.0–10.5)
nRBC: 0 % (ref 0.0–0.2)

## 2020-07-16 MED ORDER — DICYCLOMINE HCL 20 MG PO TABS: 20.0000 mg | ORAL_TABLET | Freq: Three times a day (TID) | ORAL | 0 refills | Status: DC | PRN

## 2020-07-16 MED ORDER — AMOXICILLIN-POT CLAVULANATE 875-125 MG PO TABS: 1.0000 | ORAL_TABLET | Freq: Two times a day (BID) | ORAL | 0 refills | Status: DC

## 2020-07-16 MED ORDER — ONDANSETRON HCL 4 MG PO TABS: 4.0000 mg | ORAL_TABLET | Freq: Every day | ORAL | 0 refills | Status: DC | PRN

## 2020-07-16 NOTE — Evaluation (Signed)
Physical Therapy Evaluation Patient Details Name: Connie Mitchell MRN: [id number] DOB: 1934-05-19 Today's Date: 07/16/2020   History of Present Illness  Connie Mitchell is a 87 y.o. female with medical history significant for atrial fibrillation/flutter on Xarelto, breast cancer with recurrent s/p radiation and left mastectomy, thoracic aortic aneurysm, and osteoporosis who presents with concerns of rectal bleeding.  Clinical Impression  Patient received in bed, very pleasant and agrees to PT assessment. Patient is independent with bed mobility and transfers. Ambulates with rollator and supervision 400'. No difficulty, no lob. Patient is at baseline level of function and does not require PT follow up. Will sign off.     Follow Up Recommendations No PT follow up    Equipment Recommendations  None recommended by PT    Recommendations for Other Services       Precautions / Restrictions Precautions Precautions: None Restrictions Weight Bearing Restrictions: No      Mobility  Bed Mobility Overal bed mobility: Independent                  Transfers Overall transfer level: Independent Equipment used: None                Ambulation/Gait Ambulation/Gait assistance: Modified independent (Device/Increase time) Gait Distance (Feet): 400 Feet Assistive device: 4-wheeled walker Gait Pattern/deviations: Step-through pattern;Trunk flexed Gait velocity: decr   General Gait Details: patient at baseline level of functioning. Leans upper body on walker due to h/o back issues.  Stairs            Wheelchair Mobility    Modified Rankin (Stroke Patients Only)       Balance Overall balance assessment: Modified Independent                                           Pertinent Vitals/Pain Pain Assessment: No/denies pain    Home Living Family/patient expects to be discharged to:: Private residence Living Arrangements: Non-relatives/Friends Available  Help at Discharge: Available 24 hours/day Type of Home: House         Home Equipment: Environmental consultant - 4 wheels      Prior Function Level of Independence: Independent with assistive device(s)         Comments: patient drives occasionally "When I have to"     Hand Dominance        Extremity/Trunk Assessment   Upper Extremity Assessment Upper Extremity Assessment: Overall WFL for tasks assessed    Lower Extremity Assessment Lower Extremity Assessment: Overall WFL for tasks assessed    Cervical / Trunk Assessment Cervical / Trunk Assessment: Kyphotic  Communication   Communication: No difficulties  Cognition Arousal/Alertness: Awake/alert Behavior During Therapy: WFL for tasks assessed/performed Overall Cognitive Status: Within Functional Limits for tasks assessed                                        General Comments      Exercises     Assessment/Plan    PT Assessment Patent does not need any further PT services  PT Problem List         PT Treatment Interventions      PT Goals (Current goals can be found in the Care Plan section)  Acute Rehab PT Goals Patient Stated Goal: to hopefully go home today PT Goal  Formulation: With patient Time For Goal Achievement: 07/16/20 Potential to Achieve Goals: Good    Frequency     Barriers to discharge        Co-evaluation               AM-PAC PT "6 Clicks" Mobility  Outcome Measure Help needed turning from your back to your side while in a flat bed without using bedrails?: None Help needed moving from lying on your back to sitting on the side of a flat bed without using bedrails?: None Help needed moving to and from a bed to a chair (including a wheelchair)?: None Help needed standing up from a chair using your arms (e.g., wheelchair or bedside chair)?: None Help needed to walk in hospital room?: None Help needed climbing 3-5 steps with a railing? : None 6 Click Score: 24    End of  Session   Activity Tolerance: Patient tolerated treatment well Patient left: in chair;with call bell/phone within reach Nurse Communication: Mobility status      Time: 1150-1220 PT Time Calculation (min) (ACUTE ONLY): 30 min   Charges:   PT Evaluation $PT Eval Low Complexity: 1 Low PT Treatments $Gait Training: 8-22 mins        Brittie Whisnant, PT, GCS 07/16/20,12:30 PM

## 2020-07-16 NOTE — Discharge Instructions (Signed)
Colitis  Colitis is a condition in which the colon is inflamed. It can cause diarrhea, blood in the stool, and abdominal pain. Colitis can last a short time (be acute), or it may last a long time (become chronic). What are the causes? This condition may be caused by:  Infections from viruses or bacteria.  A reaction to medicine.  Certain autoimmune diseases, such as Crohn's disease or ulcerative colitis.  Radiation treatment.  Decreased blood flow to the bowel (ischemia). What are the signs or symptoms? Symptoms of this condition include:  Diarrhea, blood in the stool, or black, tarry stool.  Pain in the joints or abdominal pain.  Fever or fatigue.  Vomiting.  Weight loss.  Bloating.  Having fewer bowel movements than usual.  A strong and sudden urge to have a bowel movement.  Feeling like the bowel is not empty after a bowel movement. How is this diagnosed? This condition may be diagnosed based on a stool test and a blood test. You may also have other tests, such as:  X-rays.  CT scan.  Colonoscopy.  Endoscopy.  Biopsy. How is this treated? Treatment for this condition depends on the cause. This condition may be treated with:  Steps to rest the bowel, such as not eating or drinking for a period of time.  Fluids that are given through an IV.  Medicine for pain and diarrhea.  Antibiotic medicines.  Cortisone medicines.  Surgery. Follow these instructions at home: Eating and drinking  Follow instructions from your health care provider about eating or drinking restrictions.  Drink enough fluid to keep your urine pale yellow.  Work with a dietitian to determine whether certain foods cause your condition to flare up.  Avoid foods or drinks that cause flare-ups.  Eat a well-balanced diet.   General instructions  If you were prescribed an antibiotic medicine, take it as told by your health care provider. Do not stop taking the antibiotic even if you  start to feel better.  Take over-the-counter and prescription medicines only as told by your health care provider.  Keep all follow-up visits. This is important. Contact a health care provider if:  Your symptoms do not go away.  You develop new symptoms. Get help right away if:  You have a fever that does not go away with treatment.  You develop chills.  You have extreme weakness, fainting, or dehydration.  You vomit repeatedly.  You develop severe pain in your abdomen.  You pass bloody or tarry stool. Summary  Colitis is a condition in which the colon is inflamed. Colitis can last a short time (be acute), or it may last a long time (become chronic).  Treatment for this condition depends on the cause and may include resting the bowel, taking medicines, or having surgery.  If you were prescribed an antibiotic medicine, take it as told by your health care provider. Do not stop taking the antibiotic even if you start to feel better.  Get help right away if you develop severe pain in your abdomen.  Keep all follow-up visits. This is important. This information is not intended to replace advice given to you by your health care provider. Make sure you discuss any questions you have with your health care provider. Document Revised: 02/06/2020 Document Reviewed: 02/06/2020 Elsevier Patient Education  2021 [employer]  

## 2020-07-16 NOTE — Discharge Summary (Signed)
Physician Discharge Summary  Connie Mitchell [id number] DOB: 1933-09-09 DOA: 07/14/2020  PCP: Glean Hess, MD  Admit date: 07/14/2020 Discharge date: 07/16/2020  Admitted From: Home Disposition:  Home  Recommendations for Outpatient Follow-up:  1. Consider referral back to gastroenterology 2. See her primary care physician within 1 week for posthospitalization check  Home Health: No Equipment/Devices: None Discharge Condition: Stable CODE STATUS: Full Diet recommendation: Soft/bland  Brief/Interim Summary:  87 y.o.femalewith medical history significant foratrial fibrillation/flutter on Xarelto, breast cancer with recurrent s/p radiation and left mastectomy,thoracic aortic aneurysm, and osteoporosis who presents with concerns of rectal bleeding.  Patient presented to urgent care today with concerns of rectal bleeding since she is on Xarelto. She sees blood only when wiping. She says for the past 3 days she has been having lower abdominal pain that is worse with movement and has been having mucousy stool. Had a fever up to 100.5 today. She is still able to eat and drink. Denies any nausea, vomiting. No sick contact or recent travel. Has history of oophorectomy due to a cyst.She endorsed that when she was in her 27s she was diagnosed with ulcerative colitis and was on sulfasalazine for a year to and was told to discontinue.  Following morning patient reports stability in her symptoms.  No significant improvement or deterioration noted.  Patient is remained hemodynamically stable and afebrile.  Patient remained hemodynamically stable and afebrile throughout the course of admission.  On day of discharge she was advanced to soft diet.  She tolerated this without issue and reported hunger.  Discharge Diagnoses:  Principal Problem:   Enteritis Active Problems:   Malignant neoplasm of upper-outer quadrant of left female breast (HCC)   Rectal bleed   Unspecified atrial  fibrillation (HCC)  Enteritis/proctitis Evidence ofinflammationseen on CT abdomen Unlikely to represent recurrence of ulcerative colitis Patient maintained on IV fluids Diet slowly advance to soft Patient tolerating soft diet on day of discharge without issue No nausea vomiting or worsening of abdominal pain Stable for discharge at this time DC Zosyn and start Augmentin Plan for 7-day antibiotic course Maintain soft/bland diet on discharge Follow-up outpatient PCP and consider referral back to gastroenterology considering remote history of ulcerative colitis  Rectal bleed Suspect this is mild due to mucosal injury from inflammation No evidence of hemodynamically significant bleed Hemoglobin stable Will continue with Xarelto at this time  History of atrial fibrillation/flutter Continue Xarelto  History of breast cancer Continue follow-up with oncology  Discharge Instructions  Discharge Instructions    Diet - low sodium heart healthy   Complete by: As directed    Increase activity slowly   Complete by: As directed      Allergies as of 07/16/2020      Reactions   Ciprofloxacin Nausea And Vomiting      Medication List    TAKE these medications   amoxicillin-clavulanate 875-125 MG tablet Commonly known as: Augmentin Take 1 tablet by mouth 2 (two) times daily for 6 days.   Calcium-Vitamin D 600-125 MG-UNIT Tabs Take 1 tablet by mouth 2 (two) times a day.   dicyclomine 20 MG tablet Commonly known as: BENTYL Take 1 tablet (20 mg total) by mouth 3 (three) times daily as needed for up to 7 days for spasms (stomach pain).   diltiazem 120 MG 24 hr capsule Commonly known as: CARDIZEM CD Take 120 mg by mouth daily.   loratadine 10 MG dissolvable tablet Commonly known as: CLARITIN REDITABS Take 10 mg by mouth at bedtime.  montelukast 10 MG tablet Commonly known as: SINGULAIR TAKE 1 TABLET BY MOUTH EVERYDAY AT BEDTIME What changed: See the new instructions.    Ocuvite Adult 50+ Caps   omeprazole 40 MG capsule Commonly known as: PRILOSEC TAKE 1 CAPSULE BY MOUTH EVERY DAY What changed: how much to take   Xarelto 20 MG Tabs tablet Generic drug: rivaroxaban Take 20 mg by mouth at bedtime.       Follow-up Information    Glean Hess, MD. Schedule an appointment as soon as possible for a visit in 1 week(s).   Specialty: Internal Medicine Why: Follow up with primary care for post hospitalization check up.  Discuss possible referral back to GI considering distant history of ulcerative colitis Contact information: [address] [place] [address] [phone]              Allergies  Allergen Reactions  . Ciprofloxacin Nausea And Vomiting    Consultations:  None   Procedures/Studies: CT ABDOMEN PELVIS WO CONTRAST  Result Date: 07/14/2020 CLINICAL DATA:  Abdominal pain and fever. EXAM: CT ABDOMEN AND PELVIS WITHOUT CONTRAST TECHNIQUE: Multidetector CT imaging of the abdomen and pelvis was performed following the standard protocol without IV contrast. COMPARISON:  None FINDINGS: Lower chest: There is no acute abnormality lower thorax.Again noted is aneurysmal dilatation of the thoracic aorta. Hepatobiliary: The liver is normal. Normal gallbladder.There is no biliary ductal dilation. Pancreas: Normal contours without ductal dilatation. No peripancreatic fluid collection. Spleen: Unremarkable. Adrenals/Urinary Tract: --Adrenal glands: Unremarkable. --Right kidney/ureter: There appears to be a large right-sided peripelvic cyst versus less likely a right-sided UPJ obstruction given the appearance was somewhat similar to CT dated December 05, 2018. --Left kidney/ureter: There is a punctate nonobstructing stone in the upper pole. --Urinary bladder: Unremarkable. Stomach/Bowel: --Stomach/Duodenum: No hiatal hernia or other gastric abnormality. Normal duodenal course and caliber. --Small bowel: There appears to be some wall  thickening in minimal dilatation of several small bowel loops in the low mid abdomen. There is adjacent mesenteric fat stranding and edema. --Colon: Rectosigmoid diverticulosis without acute inflammation. There is diffuse circumferential wall thickening of portions of the mid to distal rectum. --Appendix: Normal. Vascular/Lymphatic: Atherosclerotic calcification is present within the non-aneurysmal abdominal aorta, without hemodynamically significant stenosis. --No retroperitoneal lymphadenopathy. --No mesenteric lymphadenopathy. --No pelvic or inguinal lymphadenopathy. Reproductive: Unremarkable Other: There is a trace amount of free fluid the patient's pelvis. The abdominal wall is normal. Musculoskeletal. There are advanced degenerative changes throughout the visualized thoracolumbar spine. There is facet arthrosis in the lower lumbar segments. IMPRESSION: 1. There appears to be some wall thickening in minimal dilatation of several small bowel loops in the low mid abdomen with adjacent mesenteric fat stranding and edema. Findings may be secondary to an infectious or inflammatory enteritis. 2. There is diffuse circumferential wall thickening of portions of the mid to distal rectum. This may be secondary to proctitis. However, an underlying mass is not excluded and should be correlated with sigmoidoscopy/colonoscopy. 3. Rectosigmoid diverticulosis without acute inflammation. 4. Partially visualized ascending thoracic aortic aneurysm, better visualized on the patient's prior CT of the chest. 5. Probable large right-sided peripelvic cysts versus less likely hydronephrosis with a right UPJ obstruction. This is not well evaluated in the absence of IV contrast. 6. Nonobstructing left-sided nephrolithiasis. Aortic Atherosclerosis (ICD10-I70.0). Electronically Signed   By: Constance Holster M.D.   On: 07/14/2020 15:54    (Echo, Carotid, EGD, Colonoscopy, ERCP)    Subjective: Patient seen and examined on the day of  discharge.  No distress, feels well.  Tolerating soft diet.  Stable for discharge home  Discharge Exam: Vitals:   07/16/20 0805 07/16/20 1027  BP: 123/89 108/70  Pulse: 72 83  Resp: 16 17  Temp: 97.8 F (36.6 C) 98.4 F (36.9 C)  SpO2: 97%    Vitals:   07/15/20 2344 07/16/20 0434 07/16/20 0805 07/16/20 1027  BP: 111/70 113/70 123/89 108/70  Pulse: 86 75 72 83  Resp: 16 16 16 17   Temp: 98.7 F (37.1 C) 98.2 F (36.8 C) 97.8 F (36.6 C) 98.4 F (36.9 C)  TempSrc: Oral Oral Oral Oral  SpO2: 94% 95% 97%   Weight:      Height:        General: Pt is alert, awake, not in acute distress Cardiovascular: RRR, S1/S2 +, no rubs, no gallops Respiratory: CTA bilaterally, no wheezing, no rhonchi Abdominal: Soft, NT, ND, bowel sounds + Extremities: no edema, no cyanosis    The results of significant diagnostics from this hospitalization (including imaging, microbiology, ancillary and laboratory) are listed below for reference.     Microbiology: Recent Results (from the past 240 hour(s))  Blood culture (routine x 2)     Status: None (Preliminary result)   Collection Time: 07/14/20  5:09 PM   Specimen: BLOOD RIGHT FOREARM  Result Value Ref Range Status   Specimen Description BLOOD RIGHT FOREARM  Final   Special Requests   Final    BOTTLES DRAWN AEROBIC AND ANAEROBIC Blood Culture adequate volume   Culture   Final    NO GROWTH 2 DAYS Performed at [facility], [address], [place], [place] [address]    Report Status PENDING  Incomplete  SARS CORONAVIRUS 2 (TAT 6-24 HRS) Nasopharyngeal Nasopharyngeal Swab     Status: None   Collection Time: 07/14/20  8:27 PM   Specimen: Nasopharyngeal Swab  Result Value Ref Range Status   SARS Coronavirus 2 NEGATIVE NEGATIVE Final    Comment: (NOTE) SARS-CoV-2 target nucleic acids are NOT DETECTED.  The SARS-CoV-2 RNA is generally detectable in upper and lower respiratory specimens during the acute phase of infection.  Negative results do not preclude SARS-CoV-2 infection, do not rule out co-infections with other pathogens, and should not be used as the sole basis for treatment or other patient management decisions. Negative results must be combined with clinical observations, patient history, and epidemiological information. The expected result is Negative.  Fact Sheet for Patients: SugarRoll.be  Fact Sheet for Healthcare Providers: https://www.woods-mathews.com/  This test is not yet approved or cleared by the [place] FDA and  has been authorized for detection and/or diagnosis of SARS-CoV-2 by FDA under an Emergency Use Authorization (EUA). This EUA will remain  in effect (meaning this test can be used) for the duration of the COVID-19 declaration under Se ction 564(b)(1) of the Act, 21 U.S.C. section 360bbb-3(b)(1), unless the authorization is terminated or revoked sooner.  Performed at [facility], [place] [address], [place], [place] [address]   Blood culture (routine x 2)     Status: None (Preliminary result)   Collection Time: 07/14/20  9:04 PM   Specimen: BLOOD  Result Value Ref Range Status   Specimen Description BLOOD BLOOD LEFT WRIST  Final   Special Requests   Final    BOTTLES DRAWN AEROBIC AND ANAEROBIC Blood Culture adequate volume   Culture   Final    NO GROWTH 2 DAYS Performed at [facility], [place],  [place] [address]    Report Status PENDING  Incomplete  Gastrointestinal Panel by PCR , Stool     Status: None   Collection Time: 07/15/20  9:29 AM   Specimen: Stool  Result Value Ref Range Status   Campylobacter species NOT DETECTED NOT DETECTED Final   Plesimonas shigelloides NOT DETECTED NOT DETECTED Final   Salmonella species NOT DETECTED NOT DETECTED Final   Yersinia enterocolitica NOT DETECTED NOT DETECTED Final   Vibrio species NOT DETECTED NOT DETECTED Final   Vibrio cholerae NOT DETECTED  NOT DETECTED Final   Enteroaggregative E coli (EAEC) NOT DETECTED NOT DETECTED Final   Enteropathogenic E coli (EPEC) NOT DETECTED NOT DETECTED Final   Enterotoxigenic E coli (ETEC) NOT DETECTED NOT DETECTED Final   Shiga like toxin producing E coli (STEC) NOT DETECTED NOT DETECTED Final   Shigella/Enteroinvasive E coli (EIEC) NOT DETECTED NOT DETECTED Final   Cryptosporidium NOT DETECTED NOT DETECTED Final   Cyclospora cayetanensis NOT DETECTED NOT DETECTED Final   Entamoeba histolytica NOT DETECTED NOT DETECTED Final   Giardia lamblia NOT DETECTED NOT DETECTED Final   Adenovirus F40/41 NOT DETECTED NOT DETECTED Final   Astrovirus NOT DETECTED NOT DETECTED Final   Norovirus GI/GII NOT DETECTED NOT DETECTED Final   Rotavirus A NOT DETECTED NOT DETECTED Final   Sapovirus (I, II, IV, and V) NOT DETECTED NOT DETECTED Final    Comment: Performed at [facility], [address] [place], [place] [address]  C Difficile Quick Screen w PCR reflex     Status: None   Collection Time: 07/15/20  9:29 AM   Specimen: Stool  Result Value Ref Range Status   C Diff antigen NEGATIVE NEGATIVE Final   C Diff toxin NEGATIVE NEGATIVE Final   C Diff interpretation No C. difficile detected.  Final    Comment: Performed at [facility], [address] [place],  [address]     Labs: BNP (last 3 results) No results for input(s): BNP in the last 8760 hours. Basic Metabolic Panel: Recent Labs  Lab 07/14/20 1525 07/15/20 0547  NA 134* 140  K 3.9 3.3*  CL 99 108  CO2 25 24  GLUCOSE 99 102*  BUN 15 11  CREATININE 0.62 0.56  CALCIUM 8.8* 7.8*   Liver Function Tests: Recent Labs  Lab 07/14/20 1525  AST 21  ALT 25  ALKPHOS 57  BILITOT 0.6  PROT 7.1  ALBUMIN 3.7   Recent Labs  Lab 07/14/20 1525  LIPASE 30   No results for input(s): AMMONIA in the last 168 hours. CBC: Recent Labs  Lab 07/14/20 1525 07/15/20 0547 07/16/20 0626  WBC 11.8* 9.8 9.1  NEUTROABS 9.5*   --   --   HGB 14.3 12.4 12.7  HCT 42.8 35.4* 36.5  MCV 91.5 91.0 91.5  PLT ACLUMP PLATELET CLUMPS NOTED ON SMEAR, UNABLE TO ESTIMATE PLATELET CLUMPS NOTED ON SMEAR, UNABLE TO ESTIMATE   Cardiac Enzymes: No results for input(s): CKTOTAL, CKMB, CKMBINDEX, TROPONINI in the last 168 hours. BNP: Invalid input(s): POCBNP CBG: No results for input(s): GLUCAP in the last 168 hours. D-Dimer No results for input(s): DDIMER in the last 72 hours. Hgb A1c No results for input(s): HGBA1C in the last 72 hours. Lipid Profile No results for input(s): CHOL, HDL, LDLCALC, TRIG, CHOLHDL, LDLDIRECT in the last 72 hours. Thyroid function studies No results for input(s): TSH, T4TOTAL, T3FREE, THYROIDAB in the last 72 hours.  Invalid input(s): FREET3 Anemia work up No results for  input(s): VITAMINB12, FOLATE, FERRITIN, TIBC, IRON, RETICCTPCT in the last 72 hours. Urinalysis    Component Value Date/Time   BILIRUBINUR neg 05/27/2018 1057   PROTEINUR Negative 05/27/2018 1057   UROBILINOGEN 0.2 05/27/2018 1057   NITRITE neg 05/27/2018 1057   LEUKOCYTESUR Trace (A) 05/27/2018 1057   Sepsis Labs Invalid input(s): PROCALCITONIN,  WBC,  LACTICIDVEN Microbiology Recent Results (from the past 240 hour(s))  Blood culture (routine x 2)     Status: None (Preliminary result)   Collection Time: 07/14/20  5:09 PM   Specimen: BLOOD RIGHT FOREARM  Result Value Ref Range Status   Specimen Description BLOOD RIGHT FOREARM  Final   Special Requests   Final    BOTTLES DRAWN AEROBIC AND ANAEROBIC Blood Culture adequate volume   Culture   Final    NO GROWTH 2 DAYS Performed at [facility], [address], [place], [place] [address]    Report Status PENDING  Incomplete  SARS CORONAVIRUS 2 (TAT 6-24 HRS) Nasopharyngeal Nasopharyngeal Swab     Status: None   Collection Time: 07/14/20  8:27 PM   Specimen: Nasopharyngeal Swab  Result Value Ref Range Status   SARS Coronavirus 2 NEGATIVE NEGATIVE Final     Comment: (NOTE) SARS-CoV-2 target nucleic acids are NOT DETECTED.  The SARS-CoV-2 RNA is generally detectable in upper and lower respiratory specimens during the acute phase of infection. Negative results do not preclude SARS-CoV-2 infection, do not rule out co-infections with other pathogens, and should not be used as the sole basis for treatment or other patient management decisions. Negative results must be combined with clinical observations, patient history, and epidemiological information. The expected result is Negative.  Fact Sheet for Patients: SugarRoll.be  Fact Sheet for Healthcare Providers: https://www.woods-mathews.com/  This test is not yet approved or cleared by the [place] FDA and  has been authorized for detection and/or diagnosis of SARS-CoV-2 by FDA under an Emergency Use Authorization (EUA). This EUA will remain  in effect (meaning this test can be used) for the duration of the COVID-19 declaration under Se ction 564(b)(1) of the Act, 21 U.S.C. section 360bbb-3(b)(1), unless the authorization is terminated or revoked sooner.  Performed at [facility], [place] [address], [place], [place] [address]   Blood culture (routine x 2)     Status: None (Preliminary result)   Collection Time: 07/14/20  9:04 PM   Specimen: BLOOD  Result Value Ref Range Status   Specimen Description BLOOD BLOOD LEFT WRIST  Final   Special Requests   Final    BOTTLES DRAWN AEROBIC AND ANAEROBIC Blood Culture adequate volume   Culture   Final    NO GROWTH 2 DAYS Performed at [facility], [address] [place],  [address]    Report Status PENDING  Incomplete  Gastrointestinal Panel by PCR , Stool     Status: None   Collection Time: 07/15/20  9:29 AM   Specimen: Stool  Result Value Ref Range Status   Campylobacter species NOT DETECTED NOT DETECTED Final   Plesimonas shigelloides NOT DETECTED NOT DETECTED Final    Salmonella species NOT DETECTED NOT DETECTED Final   Yersinia enterocolitica NOT DETECTED NOT DETECTED Final   Vibrio species NOT DETECTED NOT DETECTED Final   Vibrio cholerae NOT DETECTED NOT DETECTED Final   Enteroaggregative E coli (EAEC) NOT DETECTED NOT DETECTED Final   Enteropathogenic E coli (EPEC) NOT DETECTED NOT DETECTED Final   Enterotoxigenic E coli (ETEC) NOT DETECTED NOT DETECTED  Final   Shiga like toxin producing E coli (STEC) NOT DETECTED NOT DETECTED Final   Shigella/Enteroinvasive E coli (EIEC) NOT DETECTED NOT DETECTED Final   Cryptosporidium NOT DETECTED NOT DETECTED Final   Cyclospora cayetanensis NOT DETECTED NOT DETECTED Final   Entamoeba histolytica NOT DETECTED NOT DETECTED Final   Giardia lamblia NOT DETECTED NOT DETECTED Final   Adenovirus F40/41 NOT DETECTED NOT DETECTED Final   Astrovirus NOT DETECTED NOT DETECTED Final   Norovirus GI/GII NOT DETECTED NOT DETECTED Final   Rotavirus A NOT DETECTED NOT DETECTED Final   Sapovirus (I, II, IV, and V) NOT DETECTED NOT DETECTED Final    Comment: Performed at [facility], [address] [place], [place] [address]  C Difficile Quick Screen w PCR reflex     Status: None   Collection Time: 07/15/20  9:29 AM   Specimen: Stool  Result Value Ref Range Status   C Diff antigen NEGATIVE NEGATIVE Final   C Diff toxin NEGATIVE NEGATIVE Final   C Diff interpretation No C. difficile detected.  Final    Comment: Performed at [facility], [address] [place], [place] [address]     Time coordinating discharge: Over 30 minutes  SIGNED:   Sidney Ace, MD  [facility] 07/16/2020, 2:01 PM Pager   If 7PM-7AM, please contact night-coverage

## 2020-07-16 NOTE — Progress Notes (Signed)
OT Cancellation Note  Patient Details Name: Connie Mitchell MRN: [id number] DOB: 12/01/1933   Cancelled Treatment:    Reason Eval/Treat Not Completed: OT screened, no needs identified, will sign off. Thank you for the OT consult. Order received, and chart reviewed. Pt noted to be at baseline level of functional independence for ADL management. Per PT, pt ambulatory with 4WW, which is her baseline. Does not require assistance with ADL management. Is toileting independently, able to dress herself, etc. No skilled needs identified. Will sign off at this time. Please re-consult if additional OT needs arise during this admission.   Shara Blazing, M.S., OTR/L Ascom: [phone] 07/16/20, 1:08 PM

## 2020-07-17 ENCOUNTER — Telehealth: Payer: Self-pay

## 2020-07-17 NOTE — Telephone Encounter (Signed)
Copied from [place] [id number]. Topic: General - Other >> Jul 17, 2020  8:13 AM Hinda Lenis D wrote: PT needs a Hospital F/UP In the next 10 days / please advise

## 2020-07-17 NOTE — Telephone Encounter (Signed)
Patient scheduled Monday 07/22/2020.

## 2020-07-17 NOTE — Telephone Encounter (Signed)
Transition Care Management Follow-up Telephone Call  Date of discharge and from where: 07/16/20 [facility]  How have you been since you were released from the hospital? Pt states she is doing okay; still having rectal/anal pain with frequent bowel movements.  Any questions or concerns? Yes - patient interested in seeing a dietition to discuss nutrition therapy management specific to managing enteritis  Items Reviewed:  Did the pt receive and understand the discharge instructions provided? Yes   Medications obtained and verified? Yes   Other? No   Any new allergies since your discharge? No   Dietary orders reviewed? Yes  Do you have support at home? Yes   Home Care and Equipment/Supplies: Were home health services ordered? no Were any new equipment or medical supplies ordered?  No   Functional Questionnaire: (I = Independent and D = Dependent) ADLs: I  Bathing/Dressing- I  Meal Prep- I  Eating- I  Maintaining continence- I  Transferring/Ambulation- I  Managing Meds- I  Follow up appointments reviewed:   PCP Hospital f/u appt confirmed? Yes  Scheduled to see Dr. Army Melia on 07/22/20 @ 3:20.  [facility] f/u appt confirmed? No  Patient to discuss referral back to GI at hosp fu appt.   Are transportation arrangements needed? No   If their condition worsens, is the pt aware to call PCP or go to the Emergency Dept.? Yes  Was the patient provided with contact information for the PCP's office or ED? Yes  Was to pt encouraged to call back with questions or concerns? Yes

## 2020-07-19 LAB — CULTURE, BLOOD (ROUTINE X 2)
Culture: NO GROWTH
Culture: NO GROWTH
Special Requests: ADEQUATE
Special Requests: ADEQUATE

## 2020-07-22 ENCOUNTER — Encounter: Payer: Self-pay | Admitting: Internal Medicine

## 2020-07-22 ENCOUNTER — Other Ambulatory Visit: Payer: Self-pay

## 2020-07-22 ENCOUNTER — Ambulatory Visit (INDEPENDENT_AMBULATORY_CARE_PROVIDER_SITE_OTHER): Payer: Medicare Other | Admitting: Internal Medicine

## 2020-07-22 VITALS — BP 128/78 | HR 89 | Ht 63.0 in | Wt 170.0 lb

## 2020-07-22 DIAGNOSIS — K625 Hemorrhage of anus and rectum: Secondary | ICD-10-CM

## 2020-07-22 DIAGNOSIS — K529 Noninfective gastroenteritis and colitis, unspecified: Secondary | ICD-10-CM | POA: Diagnosis not present

## 2020-07-22 DIAGNOSIS — B370 Candidal stomatitis: Secondary | ICD-10-CM | POA: Diagnosis not present

## 2020-07-22 MED ORDER — NYSTATIN 100000 UNIT/ML MT SUSP: 5.0000 mL | Freq: Four times a day (QID) | OROMUCOSAL | 0 refills | Status: DC

## 2020-07-22 NOTE — Progress Notes (Signed)
Date:  07/22/2020   Name:  Connie Mitchell   DOB:  03-Dec-1933   MRN:  [id number]   Chief Complaint: Hospitalization Follow-up Hospital follow up from [facility].  Admitted  07/14/20 to 07/16/20 for Enteritis.  TOC call done on 07/17/20.  Enteritis/proctitis Evidence ofinflammationseen on CT abdomen Unlikely to represent recurrence of ulcerative colitis Patient maintained on IV fluids Diet slowly advance to soft Patient tolerating soft diet on day of discharge without issue No nausea vomiting or worsening of abdominal pain Stable for discharge at this time DC Zosyn and start Augmentin Plan for 7-day antibiotic course Maintain soft/bland diet on discharge Follow-up outpatient PCP and consider referral back to gastroenterology considering remote history of ulcerative colitis >>> she finished the antibiotic today.  Stools are formed, no further bleeding.  She has advanced her diet to regular food but is taking a probiotic.  She has minimal abdominal tenderness.  She has developed a sore tongue and oral mucosa.  Rectal bleed Suspect this is mild due to mucosal injury from inflammation No evidence of hemodynamically significant bleed Hemoglobin stable Will continue with Xarelto at this time  >>> No further bleeding; resumed xarelto at discharge. HPI  Lab Results  Component Value Date   CREATININE 0.56 07/15/2020   BUN 11 07/15/2020   NA 140 07/15/2020   K 3.3 (L) 07/15/2020   CL 108 07/15/2020   CO2 24 07/15/2020   Lab Results  Component Value Date   CHOL 174 12/12/2019   HDL 56 12/12/2019   LDLCALC 101 (H) 12/12/2019   TRIG 87 12/12/2019   CHOLHDL 3.1 12/12/2019   Lab Results  Component Value Date   TSH 5.100 (H) 06/05/2020   No results found for: HGBA1C Lab Results  Component Value Date   WBC 9.1 07/16/2020   HGB 12.7 07/16/2020   HCT 36.5 07/16/2020   MCV 91.5 07/16/2020   PLT PLATELET CLUMPS NOTED ON SMEAR, UNABLE TO ESTIMATE 07/16/2020   Lab Results  Component  Value Date   ALT 25 07/14/2020   AST 21 07/14/2020   ALKPHOS 57 07/14/2020   BILITOT 0.6 07/14/2020     Review of Systems  Constitutional: Negative for appetite change, chills, fatigue and fever.  HENT: Positive for mouth sores. Negative for trouble swallowing.   Respiratory: Positive for chest tightness, shortness of breath and wheezing.   Cardiovascular: Positive for chest pain.  Gastrointestinal: Negative for abdominal pain, blood in stool, constipation, diarrhea and vomiting.  Neurological: Negative for dizziness and headaches.  Psychiatric/Behavioral: Negative for dysphoric mood and sleep disturbance. The patient is not nervous/anxious.     Patient Active Problem List   Diagnosis Date Noted  . Enteritis 07/14/2020  . Rectal bleed 07/14/2020  . Unspecified atrial fibrillation ([place]) 07/14/2020  . Aortic atherosclerosis ([place]) 12/12/2019  . Axillary adenopathy 01/01/2019  . 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[place]) 12/30/2018  . Osteopenia of neck of right femur 07/12/2018  . Malignant neoplasm of upper-outer quadrant of left female breast ([place]) 07/07/2018  . Thoracic ascending aortic aneurysm ([place]) 05/27/2018  . LPRD (laryngopharyngeal reflux disease) 03/17/2018  . Chronic foot pain 11/25/2017  . Lumbar degenerative disc disease 09/24/2017  . Hx of Lyme disease 09/24/2017  . Hx of basal cell carcinoma 09/24/2017  . Nuclear sclerotic cataract of left eye 08/09/2015  . Osteoporosis 07/04/2014  . Primary localized osteoarthrosis of shoulder region 12/13/2012  . Osteoarthritis 12/18/2011    Allergies  Allergen Reactions  . Ciprofloxacin Nausea And Vomiting    Past Surgical  History:  Procedure Laterality Date  . BREAST BIOPSY Right 02/22/2020   stereo bx, ribbon clip, path pending   . BREAST LUMPECTOMY Left 1991   breast ca, lumpectomy  . cataracts Bilateral   . CYSTOCELE REPAIR  2010  . EYE SURGERY Bilateral 2017   cataracts extracted  . JOINT REPLACEMENT    . MASTECTOMY Left  07/25/2018   Invasive Mammary Carcinoma  . MASTECTOMY W/ SENTINEL NODE BIOPSY Left 07/25/2018   Procedure: MASTECTOMY WITH SENTINEL LYMPH NODE BIOPSY;  Surgeon: Olean Ree, MD;  Location: [facility] ORS;  Service: General;  Laterality: Left;  . OOPHORECTOMY  1995   ovarian mass - benign  . REPLACEMENT TOTAL KNEE Bilateral 2012,2013  . SKIN BIOPSY Left    arm growth  . TONSILLECTOMY  1958  . TOTAL SHOULDER ARTHROPLASTY Right 2016    Social History   Tobacco Use  . Smoking status: Never Smoker  . Smokeless tobacco: Never Used  . Tobacco comment: smoking cessation materials not required  Vaping Use  . Vaping Use: Never used  Substance Use Topics  . Alcohol use: Yes    Alcohol/week: 2.0 standard drinks    Types: 1 Cans of beer, 1 Glasses of wine per week    Comment: occasional  . Drug use: Never     Medication list has been reviewed and updated.  Current Meds  Medication Sig  . Calcium Carbonate-Vitamin D (CALCIUM-VITAMIN D) 600-125 MG-UNIT TABS Take 1 tablet by mouth 2 (two) times a day.   . montelukast (SINGULAIR) 10 MG tablet TAKE 1 TABLET BY MOUTH EVERYDAY AT BEDTIME (Patient taking differently: Take 10 mg by mouth at bedtime.)  . Multiple Vitamins-Minerals ([place] 50+) CAPS   . omeprazole (PRILOSEC) 40 MG capsule TAKE 1 CAPSULE BY MOUTH EVERY DAY (Patient taking differently: Take 40 mg by mouth daily.)  . XARELTO 20 MG TABS tablet Take 20 mg by mouth at bedtime.    PHQ 2/9 Scores 07/22/2020 07/22/2020 06/05/2020 12/12/2019  PHQ - 2 Score 0 0 0 2  PHQ- 9 Score 1 0 1 2    GAD 7 : Generalized Anxiety Score 07/22/2020 07/22/2020 06/05/2020 12/12/2019  Nervous, Anxious, on Edge 2 0 0 1  Control/stop worrying 2 0 0 1  Worry too much - different things 2 0 0 1  Trouble relaxing 0 0 0 0  Restless 0 0 0 0  Easily annoyed or irritable 2 0 0 0  Afraid - awful might happen 0 0 0 0  Total GAD 7 Score 8 0 0 3  Anxiety Difficulty - Not difficult at all - Not difficult at all     BP Readings from Last 3 Encounters:  07/22/20 128/78  07/16/20 108/70  07/14/20 (!) 136/97    Physical Exam Vitals and nursing note reviewed.  Constitutional:      General: She is not in acute distress.    Appearance: She is well-developed.  HENT:     Head: Normocephalic and atraumatic.     Mouth/Throat:     Lips: Pink.     Mouth: Mucous membranes are moist.     Comments: Tongue and buccal mucosal red with scattered white patches Cardiovascular:     Rate and Rhythm: Normal rate. Rhythm regularly irregular.     Pulses: Normal pulses.     Heart sounds: Heart sounds are distant.  Pulmonary:     Effort: Pulmonary effort is normal. No respiratory distress.     Breath sounds: Decreased breath sounds present.  No wheezing or rhonchi.  Abdominal:     General: Bowel sounds are normal.     Palpations: Abdomen is soft.     Tenderness: There is no abdominal tenderness.  Musculoskeletal:        General: Normal range of motion.     Cervical back: Normal range of motion.     Right lower leg: No edema.     Left lower leg: No edema.  Lymphadenopathy:     Cervical: No cervical adenopathy.  Skin:    General: Skin is warm and dry.     Findings: No rash.  Neurological:     Mental Status: She is alert and oriented to person, place, and time.  Psychiatric:        Attention and Perception: Attention normal.        Mood and Affect: Mood and affect and mood normal.        Behavior: Behavior normal.        Thought Content: Thought content normal.     Wt Readings from Last 3 Encounters:  07/22/20 170 lb (77.1 kg)  07/14/20 169 lb (76.7 kg)  07/14/20 169 lb (76.7 kg)    BP 128/78   Pulse 89   Ht 5\' 3"  (1.6 m)   Wt 170 lb (77.1 kg)   SpO2 95%   BMI 30.11 kg/m   Assessment and Plan: 1. Enteritis Symptoms have mainly resolved after oral antibiotics Recommend continued probiotics If sx recur, will refer to GI.  At this time patient does not prefer any further work up.  2.  Rectal bleed Resolved Continue DOAC therapy for Afib  3. Thrush, oral - nystatin (MYCOSTATIN) 100000 UNIT/ML suspension; Take 5 mLs (500,000 Units total) by mouth 4 (four) times daily. Swish, gargle and spit  Dispense: 60 mL; Refill: 0   Partially dictated using Editor, commissioning. Any errors are unintentional.  Halina Maidens, MD [facility]  07/22/2020

## 2020-07-30 DIAGNOSIS — R0989 Other specified symptoms and signs involving the circulatory and respiratory systems: Secondary | ICD-10-CM | POA: Diagnosis not present

## 2020-07-30 DIAGNOSIS — I6523 Occlusion and stenosis of bilateral carotid arteries: Secondary | ICD-10-CM | POA: Diagnosis not present

## 2020-08-02 DIAGNOSIS — I4891 Unspecified atrial fibrillation: Secondary | ICD-10-CM | POA: Diagnosis not present

## 2020-08-02 DIAGNOSIS — I4892 Unspecified atrial flutter: Secondary | ICD-10-CM | POA: Diagnosis not present

## 2020-08-02 DIAGNOSIS — I700 Atherosclerosis of aorta: Secondary | ICD-10-CM | POA: Diagnosis not present

## 2020-09-30 IMAGING — MG US  BREAST BX W/ LOC DEV 1ST LESION IMG BX SPEC US GUIDE*R*
1 series · 8 of 8 positions shown · non-contrast
Comparison: Previous exam(s).
COMPARISON: Previous exam(s).
COMPARISON: Previous exam(s).

Addendum:
CLINICAL DATA: Focal cortical thickening of a right axillary lymph
node. History of left breast cancer.

EXAM:
ULTRASOUND GUIDED RIGHT BREAST CORE NEEDLE BIOPSY

[Series 1: MG view · 0.09mm/px · 8 of 15 slices shown]
[im 1/15]
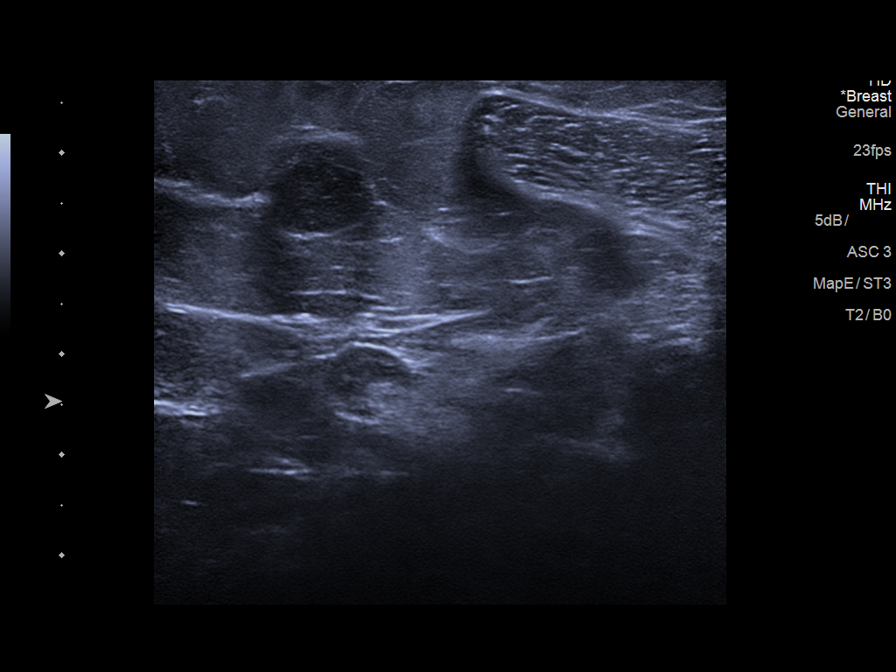
[im 3/15]
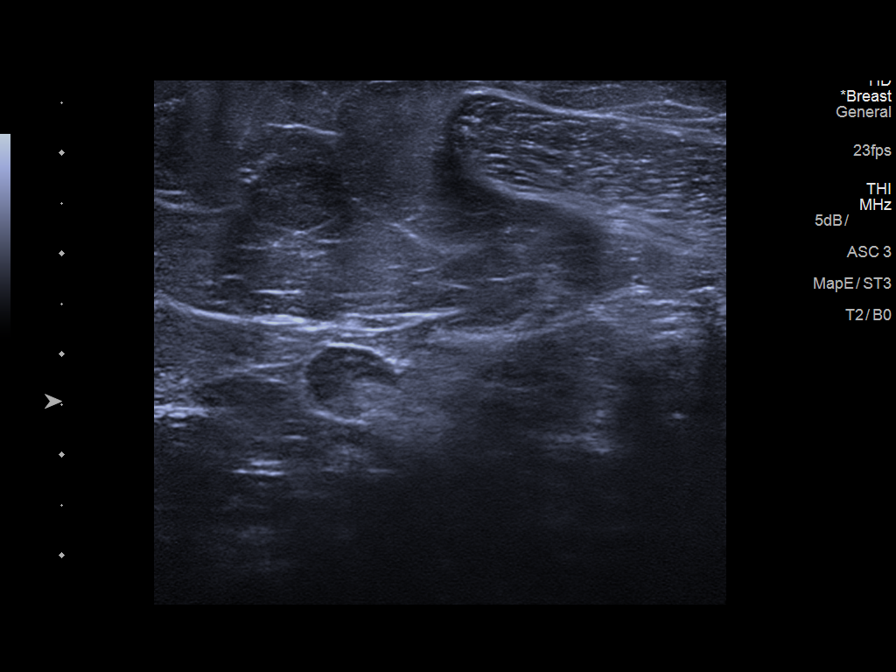
[im 5/15]
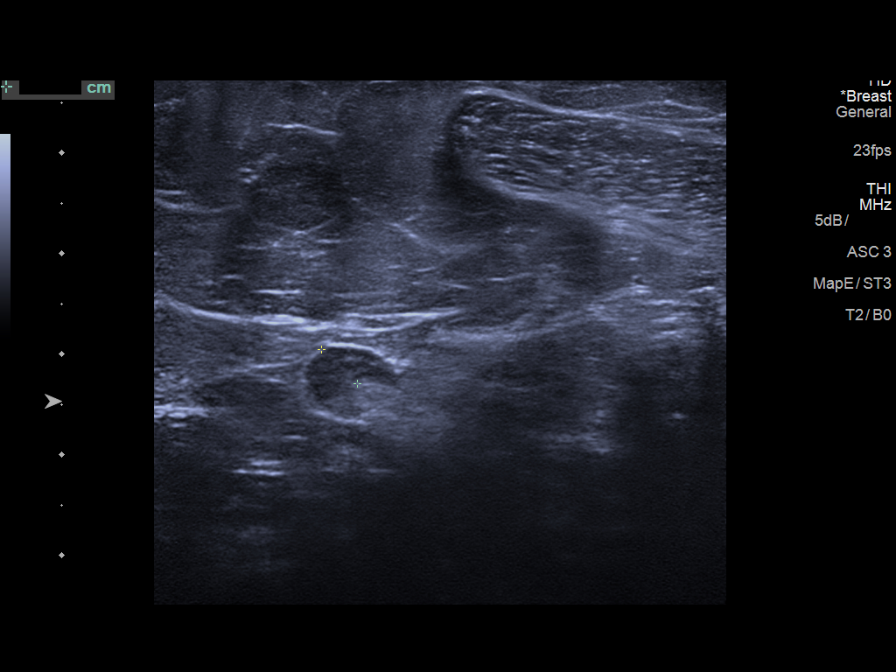
[im 7/15]
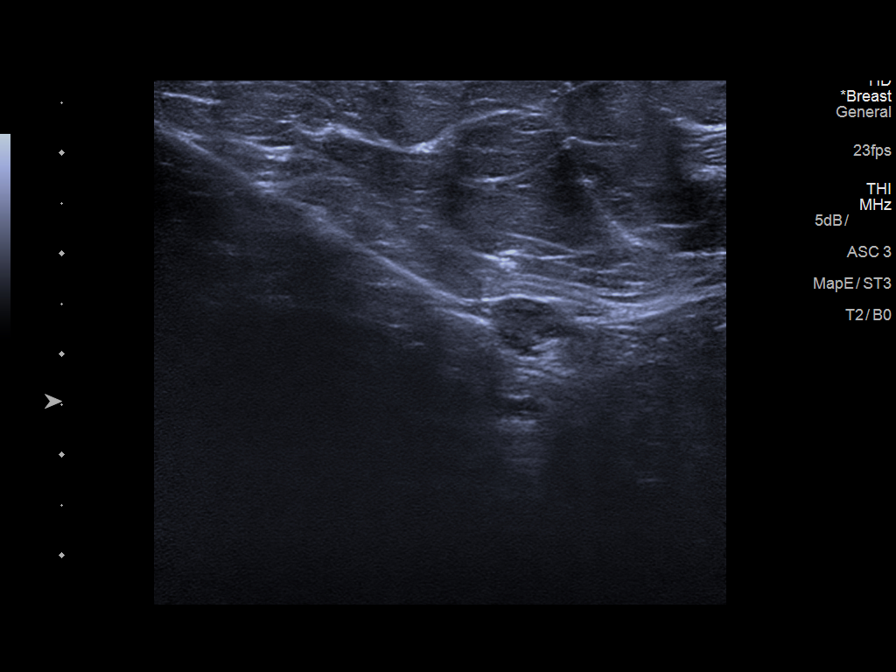
[im 9/15]
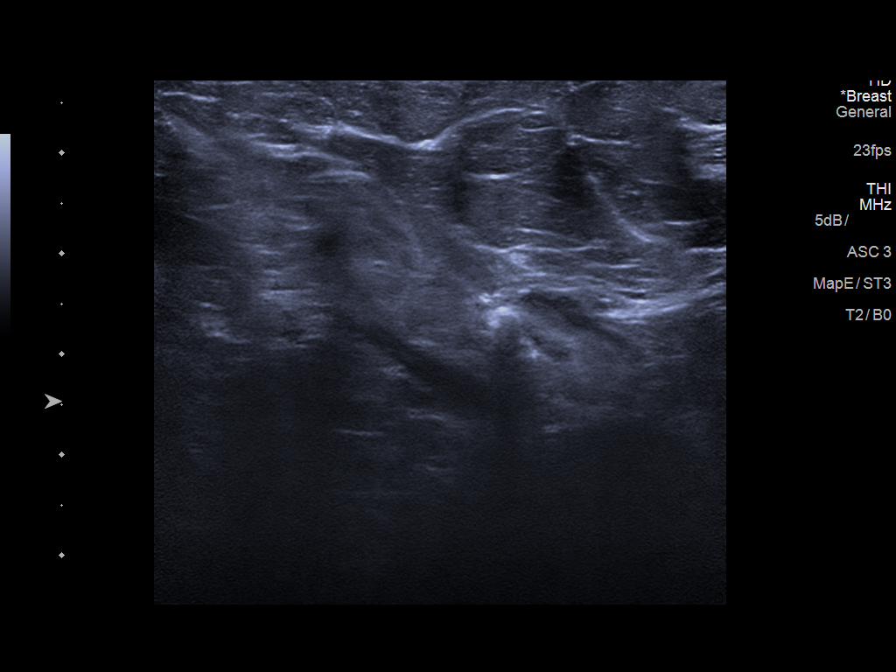
[im 11/15]
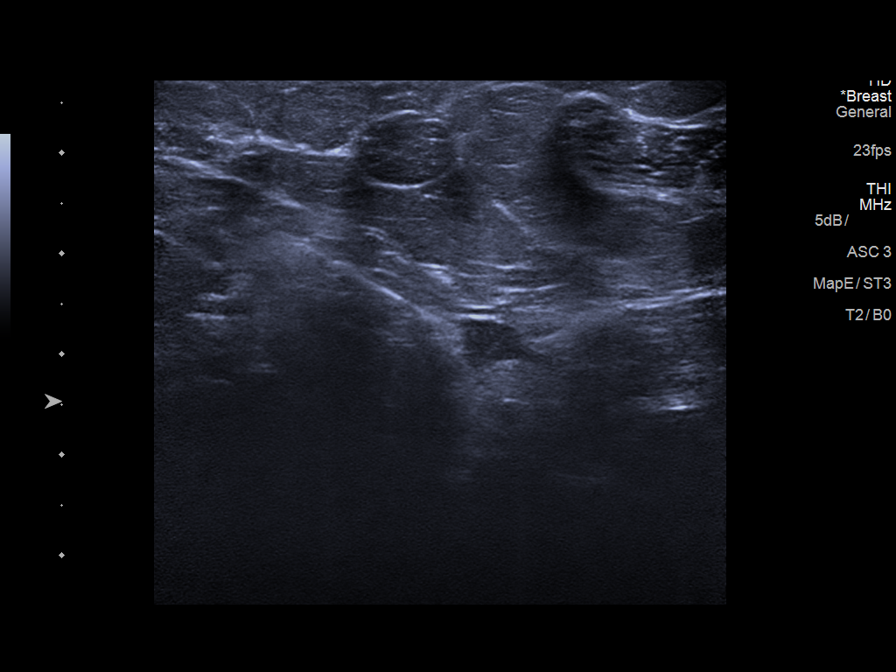
[im 13/15]
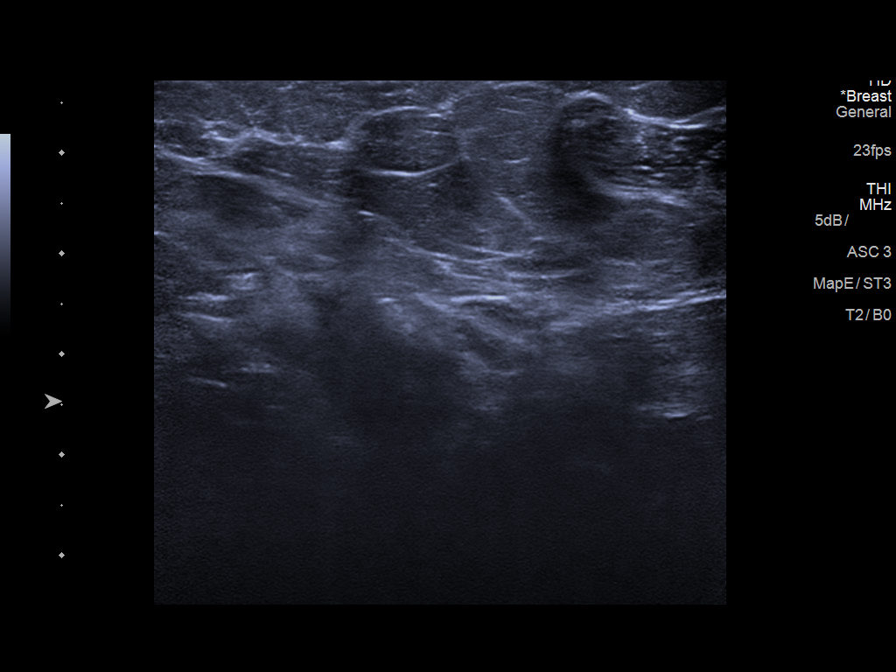
[im 15/15]
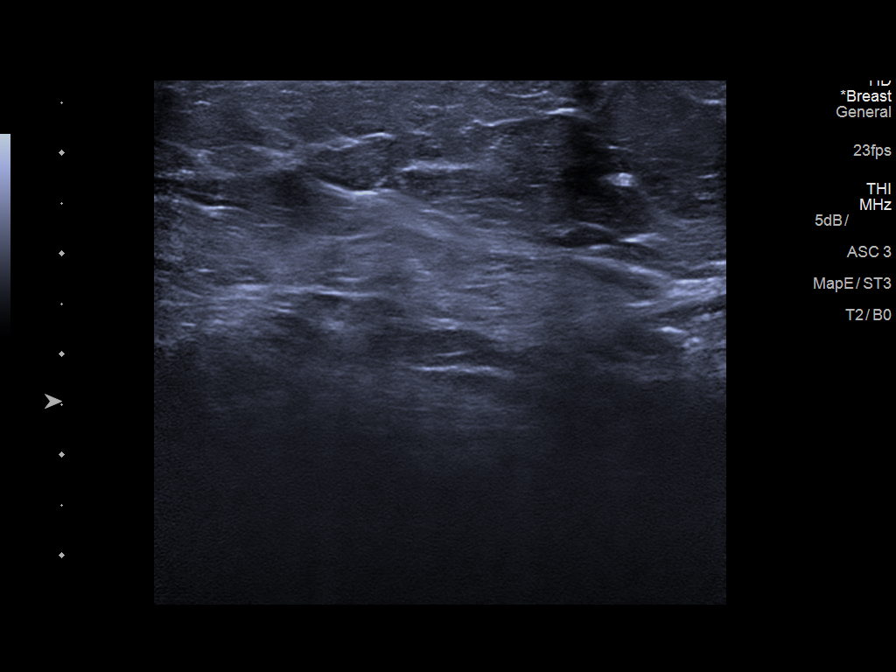

[8 of 8 positions shown; findings below may reference images not displayed]



Lesion quadrant: Right axilla

Using sterile technique and 1% lidocaine and 1% lidocaine with
epinephrine as local anesthetic, under direct ultrasound
visualization, a 14 gauge Fransik device was used to perform
biopsy of a right axillary lymph node using a inferior to superior
approach. At the conclusion of the procedure HydroMARK tissue marker
clip was deployed into the biopsy cavity. Follow up 2 view mammogram
was performed and dictated separately.
IMPRESSION: Ultrasound guided biopsy of the right axilla. No apparent
complications.

ADDENDUM:
Post biopsy mammogram films were attempted but unsuccessful.
Secondary to the far posterior location of the clip and the patient
having a frozen shoulder post biopsy clip films could not be
obtained.

ADDENDUM:
PATHOLOGY revealed: LYMPH NODE, RIGHT AXILLA; ULTRASOUND-GUIDED CORE
BIOPSY: - LYMPH NODE TISSUE NEGATIVE FOR FEATURES OF A NEOPLASTIC
PROCESS.

Comment: No metastatic carcinoma is identified in this sample. There
are no architectural or cytologic features to suggest a lymphoid
neoplasm. No granulomas are seen. Clinical correlation and follow-up
are advised. If there is continued concern, then rebiopsy or
excision may be necessary for diagnosis.

Pathology results are CONCORDANT with imaging findings, per Dr. Ponder
Ejidike.

Pathology results were discussed with patient via telephone. The
patient reported doing well after the biopsy with no adverse
symptoms, only tenderness at the site. Post biopsy care instructions
were reviewed and questions were answered. The patient was
encouraged to call [HOSPITAL] for any additional
questions or concerns.

Recommendation: Patient instructed to continue monthly self breast
exams, clinical follow up as needed, and return for annual routine
bilateral screening mammogram.

Addendum by Henru Kasa RN on 04/13/2019.

*** End of Addendum ***
Addendum:
FINDINGS: I met with the patient and we discussed the procedure of
ultrasound-guided biopsy, including benefits and alternatives. We
discussed the high likelihood of a successful procedure. We
discussed the risks of the procedure, including infection, bleeding,
tissue injury, clip migration, and inadequate sampling. Informed
written consent was given. The usual time-out protocol was performed
immediately prior to the procedure.

Lesion quadrant: Right axilla

Using sterile technique and 1% lidocaine and 1% lidocaine with
epinephrine as local anesthetic, under direct ultrasound
visualization, a 14 gauge Fransik device was used to perform
biopsy of a right axillary lymph node using a inferior to superior
approach. At the conclusion of the procedure HydroMARK tissue marker
clip was deployed into the biopsy cavity. Follow up 2 view mammogram
was performed and dictated separately.
IMPRESSION: Ultrasound guided biopsy of the right axilla. No apparent
complications.

ADDENDUM:
Post biopsy mammogram films were attempted but unsuccessful.
Secondary to the far posterior location of the clip and the patient
having a frozen shoulder post biopsy clip films could not be
obtained.



Lesion quadrant: Right axilla

Using sterile technique and 1% lidocaine and 1% lidocaine with
epinephrine as local anesthetic, under direct ultrasound
visualization, a 14 gauge Fransik device was used to perform
biopsy of a right axillary lymph node using a inferior to superior
approach. At the conclusion of the procedure HydroMARK tissue marker
clip was deployed into the biopsy cavity. Follow up 2 view mammogram
was performed and dictated separately.
IMPRESSION: Ultrasound guided biopsy of the right axilla. No apparent
complications.

## 2020-10-02 ENCOUNTER — Encounter: Payer: Self-pay | Admitting: Internal Medicine

## 2020-10-08 ENCOUNTER — Encounter: Payer: Self-pay | Admitting: Emergency Medicine

## 2020-10-08 ENCOUNTER — Ambulatory Visit
Admission: EM | Admit: 2020-10-08 | Discharge: 2020-10-08 | Disposition: A | Discharge status: Home / Self Care | Payer: Medicare Other | Attending: Family Medicine | Admitting: Family Medicine

## 2020-10-08 ENCOUNTER — Encounter: Payer: Self-pay | Admitting: Internal Medicine

## 2020-10-08 ENCOUNTER — Ambulatory Visit (INDEPENDENT_AMBULATORY_CARE_PROVIDER_SITE_OTHER)
Admit: 2020-10-08 | Discharge: 2020-10-08 | Disposition: A | Discharge status: Home / Self Care | Payer: Medicare Other | Attending: Family Medicine | Admitting: Family Medicine

## 2020-10-08 ENCOUNTER — Other Ambulatory Visit: Payer: Self-pay

## 2020-10-08 DIAGNOSIS — I712 Thoracic aortic aneurysm, without rupture: Secondary | ICD-10-CM | POA: Diagnosis not present

## 2020-10-08 DIAGNOSIS — R1030 Lower abdominal pain, unspecified: Secondary | ICD-10-CM

## 2020-10-08 DIAGNOSIS — K5792 Diverticulitis of intestine, part unspecified, without perforation or abscess without bleeding: Secondary | ICD-10-CM | POA: Diagnosis not present

## 2020-10-08 DIAGNOSIS — N200 Calculus of kidney: Secondary | ICD-10-CM | POA: Diagnosis not present

## 2020-10-08 DIAGNOSIS — M4186 Other forms of scoliosis, lumbar region: Secondary | ICD-10-CM | POA: Diagnosis not present

## 2020-10-08 DIAGNOSIS — K838 Other specified diseases of biliary tract: Secondary | ICD-10-CM | POA: Diagnosis not present

## 2020-10-08 LAB — CBC WITH DIFFERENTIAL/PLATELET
Abs Immature Granulocytes: 0.06 10*3/uL (ref 0.00–0.07)
Basophils Absolute: 0 10*3/uL (ref 0.0–0.1)
Basophils Relative: 0 %
Eosinophils Absolute: 0.1 10*3/uL (ref 0.0–0.5)
Eosinophils Relative: 1 %
HCT: 36.9 % (ref 36.0–46.0)
Hemoglobin: 12.5 g/dL (ref 12.0–15.0)
Immature Granulocytes: 1 %
Lymphocytes Relative: 12 %
Lymphs Abs: 1.4 10*3/uL (ref 0.7–4.0)
MCH: 31.1 pg (ref 26.0–34.0)
MCHC: 33.9 g/dL (ref 30.0–36.0)
MCV: 91.8 fL (ref 80.0–100.0)
Monocytes Absolute: 1 10*3/uL (ref 0.1–1.0)
Monocytes Relative: 8 %
Neutro Abs: 9.2 10*3/uL — ABNORMAL HIGH (ref 1.7–7.7)
Neutrophils Relative %: 78 %
Platelets: 194 10*3/uL (ref 150–400)
RBC: 4.02 MIL/uL (ref 3.87–5.11)
RDW: 14.7 % (ref 11.5–15.5)
WBC: 12.9 10*3/uL — ABNORMAL HIGH (ref 4.0–10.5)
nRBC: 0 % (ref 0.0–0.2)

## 2020-10-08 LAB — COMPREHENSIVE METABOLIC PANEL
ALT: 22 U/L (ref 0–44)
AST: 17 U/L (ref 15–41)
Albumin: 3.7 g/dL (ref 3.5–5.0)
Alkaline Phosphatase: 57 U/L (ref 38–126)
Anion gap: 8 (ref 5–15)
BUN: 17 mg/dL (ref 8–23)
CO2: 26 mmol/L (ref 22–32)
Calcium: 8.8 mg/dL — ABNORMAL LOW (ref 8.9–10.3)
Chloride: 101 mmol/L (ref 98–111)
Creatinine, Ser: 0.64 mg/dL (ref 0.44–1.00)
GFR, Estimated: >60 mL/min (ref >60)
Glucose, Bld: 96 mg/dL (ref 70–99)
Potassium: 3.7 mmol/L (ref 3.5–5.1)
Sodium: 135 mmol/L (ref 135–145)
Total Bilirubin: 0.6 mg/dL (ref 0.3–1.2)
Total Protein: 6.9 g/dL (ref 6.5–8.1)

## 2020-10-08 LAB — LIPASE, BLOOD: Lipase: 36 U/L (ref 11–51)

## 2020-10-08 LAB — PLATELET BY CITRATE

## 2020-10-08 MED ORDER — TRAMADOL HCL 50 MG PO TABS: 50.0000 mg | ORAL_TABLET | Freq: Two times a day (BID) | ORAL | 0 refills | Status: DC | PRN

## 2020-10-08 MED ORDER — AMOXICILLIN-POT CLAVULANATE 875-125 MG PO TABS: 1.0000 | ORAL_TABLET | Freq: Two times a day (BID) | ORAL | 0 refills | Status: AC

## 2020-10-08 NOTE — ED Provider Notes (Signed)
[facility]    CSN: [id number] Arrival date & time: 10/08/20  1302  History   Chief Complaint Chief Complaint  Patient presents with  . Abdominal Pain   HPI  87 year old female presents with the above complaints. Patient reports a 3-day history of lower abdominal pain.  Located diffusely.  She also reports pain in the rectum.  Reports that she has not felt well and has had a "low-grade fever".  She has not checked her temperature.  Pain is currently 3/10 in severity.  Described as sharp.  She reports that she has passed mucus when using the bathroom.  No relieving factors.  She has a history of abdominal issues.  Last year was admitted for enteritis and proctitis.  No other complaints.   Past Medical History:  Diagnosis Date  . A-fib ([place])   . Cancer ([place]) 1991   left breast. treated with rads and lumpectomy  . Cancer ([place]) 2020   newly diagnosed, also left breast  . Cellulitis and abscess of toe of left foot 01/2018  . GERD (gastroesophageal reflux disease)   . Hx of therapeutic radiation 1991   left breast  . Laryngopharyngeal reflux    sometimes food goes down wrong way and she ends up in extreme coughing/choking scenario  . Personal history of radiation therapy 1991   left breast ca  . Primary osteoarthritis   . Thoracic aortic aneurysm (TAA) [facility])     Patient Active Problem List   Diagnosis Date Noted  . Enteritis 07/14/2020  . Rectal bleed 07/14/2020  . Unspecified atrial fibrillation ([place]) 07/14/2020  . Aortic atherosclerosis ([place]) 12/12/2019  . Axillary adenopathy 01/01/2019  . 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 Osteopenia of neck of right femur 07/12/2018  . Malignant neoplasm of upper-outer quadrant of left female breast ([place]) 07/07/2018  . Thoracic ascending aortic aneurysm ([place]) 05/27/2018  . LPRD (laryngopharyngeal reflux disease) 03/17/2018  . Chronic foot pain 11/25/2017  . Lumbar degenerative disc disease 09/24/2017  . Hx of Lyme  disease 09/24/2017  . Hx of basal cell carcinoma 09/24/2017  . Nuclear sclerotic cataract of left eye 08/09/2015  . Osteoporosis 07/04/2014  . Primary localized osteoarthrosis of shoulder region 12/13/2012  . Osteoarthritis 12/18/2011    Past Surgical History:  Procedure Laterality Date  . BREAST BIOPSY Right 02/22/2020   stereo bx, ribbon clip, path pending   . BREAST LUMPECTOMY Left 1991   breast ca, lumpectomy  . cataracts Bilateral   . CYSTOCELE REPAIR  2010  . EYE SURGERY Bilateral 2017   cataracts extracted  . JOINT REPLACEMENT    . MASTECTOMY Left 07/25/2018   Invasive Mammary Carcinoma  . MASTECTOMY W/ SENTINEL NODE BIOPSY Left 07/25/2018   Procedure: MASTECTOMY WITH SENTINEL LYMPH NODE BIOPSY;  Surgeon: Olean Ree, MD;  Location: [facility] ORS;  Service: General;  Laterality: Left;  . OOPHORECTOMY  1995   ovarian mass - benign  . REPLACEMENT TOTAL KNEE Bilateral 2012,2013  . SKIN BIOPSY Left    arm growth  . TONSILLECTOMY  1958  . TOTAL SHOULDER ARTHROPLASTY Right 2016    OB History   No obstetric history on file.      Home Medications    Prior to Admission medications   Medication Sig Start Date End Date Taking? Authorizing Provider  amoxicillin-clavulanate (AUGMENTIN) 875-125 MG tablet Take 1 tablet by mouth 2 (two) times daily for 10 days. 10/08/20 10/18/20 Yes Collin Hendley G, DO  Calcium Carbonate-Vitamin D (CALCIUM-VITAMIN D) 600-125 MG-UNIT TABS  Take 1 tablet by mouth 2 (two) times a day.    Yes [provider]  montelukast (SINGULAIR) 10 MG tablet TAKE 1 TABLET BY MOUTH EVERYDAY AT BEDTIME Patient taking differently: Take 10 mg by mouth at bedtime. 06/26/20  Yes Glean Hess, MD  Multiple Vitamins-Minerals ([place] 50+) CAPS    Yes [provider]  omeprazole (PRILOSEC) 40 MG capsule TAKE 1 CAPSULE BY MOUTH EVERY DAY Patient taking differently: Take 40 mg by mouth daily. 03/24/20  Yes Glean Hess, MD  traMADol (ULTRAM) 50 MG  tablet Take 1 tablet (50 mg total) by mouth every 12 (twelve) hours as needed for moderate pain or severe pain. 10/08/20  Yes Fielding Mault G, DO  XARELTO 20 MG TABS tablet Take 20 mg by mouth at bedtime. 06/17/20  Yes [provider]  nystatin (MYCOSTATIN) 100000 UNIT/ML suspension Take 5 mLs (500,000 Units total) by mouth 4 (four) times daily. Swish, gargle and spit 07/22/20   Glean Hess, MD    Family History Family History  Problem Relation Age of Onset  . Hypertension Mother   . Alzheimer's disease Mother   . Stroke Father   . Cancer Brother        prostate  . Atrial fibrillation Brother   . Cancer Other        breast  . Atrial fibrillation Brother   . Breast cancer Cousin 72       paternal x 2    Social History Social History   Tobacco Use  . Smoking status: Never Smoker  . Smokeless tobacco: Never Used  . Tobacco comment: smoking cessation materials not required  Vaping Use  . Vaping Use: Never used  Substance Use Topics  . Alcohol use: Yes    Alcohol/week: 2.0 standard drinks    Types: 1 Cans of beer, 1 Glasses of wine per week    Comment: occasional  . Drug use: Never     Allergies   Ciprofloxacin   Review of Systems Review of Systems  Constitutional: Negative for fever.  Gastrointestinal: Positive for abdominal pain.   Physical Exam Triage Vital Signs ED Triage Vitals  Enc Vitals Group     BP 10/08/20 1313 110/79     Pulse Rate 10/08/20 1313 91     Resp 10/08/20 1313 18     Temp 10/08/20 1313 98.9 F (37.2 C)     Temp Source 10/08/20 1313 Oral     SpO2 10/08/20 1313 97 %     Weight 10/08/20 1314 169 lb 15.6 oz (77.1 kg)     Height 10/08/20 1314 5\' 3"  (1.6 m)     Head Circumference --      Peak Flow --      Pain Score 10/08/20 1313 3     Pain Loc --      Pain Edu? --      Excl. in [place]? --    Updated Vital Signs BP 110/79 (BP Location: Right Arm)   Pulse 91   Temp 98.9 F (37.2 C) (Oral)   Resp 18   Ht 5\' 3"  (1.6 m)   Wt 77.1  kg   SpO2 97%   BMI 30.11 kg/m   Visual Acuity Right Eye Distance:   Left Eye Distance:   Bilateral Distance:    Right Eye Near:   Left Eye Near:    Bilateral Near:     Physical Exam Vitals and nursing note reviewed.  Constitutional:  General: She is not in acute distress.    Appearance: Normal appearance. She is not ill-appearing.  HENT:     Head: Normocephalic and atraumatic.  Eyes:     General:        Right eye: No discharge.        Left eye: No discharge.     Conjunctiva/sclera: Conjunctivae normal.  Cardiovascular:     Rate and Rhythm: Normal rate and regular rhythm.  Pulmonary:     Effort: Pulmonary effort is normal.     Breath sounds: Normal breath sounds. No wheezing, rhonchi or rales.  Abdominal:     Palpations: Abdomen is soft.     Comments: Mild tenderness to palpation of the lower abdomen.   Neurological:     Mental Status: She is alert.  Psychiatric:        Mood and Affect: Mood normal.        Behavior: Behavior normal.    UC Treatments / Results  Labs (all labs ordered are listed, but only abnormal results are displayed) Labs Reviewed  CBC WITH DIFFERENTIAL/PLATELET - Abnormal; Notable for the following components:      Result Value   WBC 12.9 (*)    Neutro Abs 9.2 (*)    All other components within normal limits  COMPREHENSIVE METABOLIC PANEL - Abnormal; Notable for the following components:   Calcium 8.8 (*)    All other components within normal limits  LIPASE, BLOOD  PLATELET BY CITRATE    EKG   Radiology CT ABDOMEN PELVIS WO CONTRAST  Result Date: 10/08/2020 CLINICAL DATA:  Lower abdominal pain, right greater than left, starting 3 days ago. EXAM: CT ABDOMEN AND PELVIS WITHOUT CONTRAST TECHNIQUE: Multidetector CT imaging of the abdomen and pelvis was performed following the standard protocol without IV contrast. COMPARISON:  07/14/2020 FINDINGS: Lower chest: Left mastectomy. Proximal ascending thoracic aorta 4.1 cm in diameter. Left  anterior descending coronary artery atherosclerosis. Descending thoracic aortic atherosclerotic vascular disease. Stable scarring along the left hemidiaphragm and in the posterior basal segment right lower lobe. Hepatobiliary: Prominent common bile duct at about 1.2 cm in diameter, although not substantially changed from 07/14/2020. Stable 0.8 cm hypodense lesion inferiorly in the right hepatic lobe on image 29 series 2. Gallbladder grossly unremarkable. Pancreas: Unremarkable Spleen: Unremarkable Adrenals/Urinary Tract: Both adrenal glands appear normal. Right kidney upper pole parapelvic cyst. Fluid density prominence in the right kidney lower pole hilum could be from peripelvic cysts or mildly dilated collecting system. No hydroureter. Vascular calcification along the right renal hilum. 3 mm nonobstructive calculus in the left kidney upper pole. Pelvic floor laxity with small cystocele extending 1.2 cm below the pubococcygeal line. Stomach/Bowel: In the distal sigmoid colon, there a 5.8 cm in length segment of prominent wall thickening along the anterior border on image 61 series 2, with surrounding inflammatory stranding. Given the considerable background diverticulosis, this probably represents active diverticulitis. Given the lack of IV contrast I cannot exclude a paracolic diverticular abscess in this region. Strictly speaking, sigmoid colon mass is not excluded. The inflammation in this case is near but proximal to the prior site of inflammation shown on 07/14/2020. No extraluminal gas is identified nor is there visible gas in the inferior mesenteric vein or portal vein. Redundant transverse colon extends down towards the pelvis. No dilated small bowel. Vascular/Lymphatic: Aortoiliac atherosclerotic vascular disease. Reproductive: Unremarkable Other: No supplemental non-categorized findings. Musculoskeletal: Pelvic floor laxity. Considerable levoconvex lumbar scoliosis with rotary component. Grade 1  degenerative retrolisthesis at T12-L1  and L1-2, along with 6 mm anterolisthesis at L3-4 and 10 mm anterolisthesis at L4-5 related to degenerative facet arthropathy. In conjunction with spondylosis and degenerative disc disease there is multilevel impingement in the lumbar spine. IMPRESSION: 1. Prominently thickened anterior wall in a 5.8 cm segment of distal sigmoid colon, given the underlying diverticulosis this probably represents acute diverticulitis. In the absence of IV contrast it is difficult to totally exclude the possibility of a diverticular abscess in this region although no extraluminal gas is visible. Surrounding inflammatory stranding observed. 2. Ascending thoracic aortic aneurysm, visualized portion in 4.1 cm in diameter. Recommend annual imaging followup by CTA or MRA. This recommendation follows 2010 ACCF/AHA/AATS/ACR/ASA/SCA/SCAI/SIR/STS/SVM Guidelines for the Diagnosis and Management of Patients with Thoracic Aortic Disease. Circulation. 2010; 121: Z482-L078. Aortic aneurysm NOS (ICD10-I71.9) 3. Other imaging findings of potential clinical significance: Aortic Atherosclerosis (ICD10-I70.0). Coronary atherosclerosis. Chronic mild dilatation of the common bile duct, etiology uncertain. Nonobstructive left nephrolithiasis. Pelvic floor laxity with small cystocele. Lumbar scoliosis, spondylosis, and degenerative disc disease causing multilevel impingement. Right parapelvic cyst, cannot exclude stable dilatation of the right kidney lower pole collecting system. Electronically Signed   By: Van Clines M.D.   On: 10/08/2020 14:44    Procedures Procedures (including critical care time)  Medications Ordered in UC Medications - No data to display  Initial Impression / Assessment and Plan / UC Course  I have reviewed the triage vital signs and the nursing notes.  Pertinent labs & imaging results that were available during my care of the patient were reviewed by me and considered in my  medical decision making (see chart for details).    87 year old female presents with lower abdominal pain.  Laboratory studies revealed mild leukocytosis. CMP unremarkable.  CT abdomen pelvis revealed findings consistent with diverticulitis.  She is stable currently.  Will place on Augmentin.  Advised to go to the hospital if she worsens.  Follow-up with primary care physician.  Final Clinical Impressions(s) / UC Diagnoses   Final diagnoses:  Diverticulitis     Discharge Instructions     Medication as prescribed.  If you worsen (worsening pain or fever, go to the ER).  Follow up with your PCP later this week if you can.  Take care  Dr. Lacinda Axon    ED Prescriptions    Medication Sig Dispense Auth. Provider   amoxicillin-clavulanate (AUGMENTIN) 875-125 MG tablet Take 1 tablet by mouth 2 (two) times daily for 10 days. 20 tablet Derry Arbogast G, DO   traMADol (ULTRAM) 50 MG tablet Take 1 tablet (50 mg total) by mouth every 12 (twelve) hours as needed for moderate pain or severe pain. 10 tablet Thersa Salt G, DO     I have reviewed the PDMP during this encounter.   Coral Spikes, [place] 10/08/20 1714

## 2020-10-08 NOTE — Telephone Encounter (Signed)
Pt is going to UC.  KP

## 2020-10-08 NOTE — ED Triage Notes (Signed)
Pt c/o lower abdominal pain.  She states it is bilateral but right is worse. Started about 3 days ago. She states she has had a "low grade" fever but has not checked, just felt hot.

## 2020-10-08 NOTE — ED Notes (Addendum)
No prior auth needed for CT scan.

## 2020-10-08 NOTE — Discharge Instructions (Signed)
Medication as prescribed.  If you worsen (worsening pain or fever, go to the ER).  Follow up with your PCP later this week if you can.  Take care  Dr. Lacinda Axon

## 2020-10-10 ENCOUNTER — Encounter: Payer: Self-pay | Admitting: Internal Medicine

## 2020-10-10 NOTE — Telephone Encounter (Signed)
For your information  

## 2020-11-13 ENCOUNTER — Other Ambulatory Visit: Payer: Self-pay

## 2020-11-13 ENCOUNTER — Telehealth: Payer: Self-pay

## 2020-11-13 DIAGNOSIS — R1319 Other dysphagia: Secondary | ICD-10-CM

## 2020-11-13 NOTE — Telephone Encounter (Signed)
Refer to ENT

## 2020-11-13 NOTE — Telephone Encounter (Signed)
Copied from [place] [id number]. Topic: General - Other >> Nov 13, 2020  9:02 AM Loma [place] wrote: Reason for CRM: pt has had cough for 6 yrs but now states problems swallowing and it hurts but has been ongoing and Dr B knows about it, she is wanting to talk to Dr Sharm[employer] nurse not really wanting to give detail. Restates she has had for years, states does not need anything but cb [phone]

## 2020-11-13 NOTE — Telephone Encounter (Signed)
Patient informed of referral to ENT. She verbalized understanding.

## 2020-11-19 DIAGNOSIS — R053 Chronic cough: Secondary | ICD-10-CM | POA: Diagnosis not present

## 2020-11-19 DIAGNOSIS — R1314 Dysphagia, pharyngoesophageal phase: Secondary | ICD-10-CM | POA: Diagnosis not present

## 2020-11-19 DIAGNOSIS — K219 Gastro-esophageal reflux disease without esophagitis: Secondary | ICD-10-CM | POA: Diagnosis not present

## 2020-11-22 ENCOUNTER — Other Ambulatory Visit: Payer: Self-pay | Admitting: Otolaryngology

## 2020-11-22 DIAGNOSIS — R1310 Dysphagia, unspecified: Secondary | ICD-10-CM

## 2020-11-22 DIAGNOSIS — R059 Cough, unspecified: Secondary | ICD-10-CM

## 2020-11-27 ENCOUNTER — Ambulatory Visit (INDEPENDENT_AMBULATORY_CARE_PROVIDER_SITE_OTHER): Payer: Medicare Other

## 2020-11-27 VITALS — BP 120/72 | HR 61 | Temp 98.4°F | Ht 63.0 in | Wt 172.0 lb

## 2020-11-27 DIAGNOSIS — Z0000 Encounter for general adult medical examination without abnormal findings: Secondary | ICD-10-CM | POA: Diagnosis not present

## 2020-11-27 NOTE — Progress Notes (Signed)
Subjective:   Connie Mitchell is a 87 y.o. female who presents for Medicare Annual (Subsequent) preventive examination.  Virtual Visit via Telephone Note  I connected with  Connie Mitchell on 11/27/20 at 10:40 AM EDT by telephone and verified that I am speaking with the correct person using two identifiers.  Location: Patient: home Provider: [facility] Persons participating in the virtual visit: [place]   I discussed the limitations, risks, security and privacy concerns of performing an evaluation and management service by telephone and the availability of in person appointments. The patient expressed understanding and agreed to proceed.  Interactive audio and video telecommunications were attempted between this nurse and patient, however failed, due to patient having technical difficulties OR patient did not have access to video capability.  We continued and completed visit with audio only.  Some vital signs may be absent or patient reported.   [name], LPN   Review of Systems     Cardiac Risk Factors include: advanced age (>15men, >55 women)     Objective:    Today's Vitals   11/27/20 1052  BP: 120/72  Pulse: 61  Temp: 98.4 F (36.9 C)  Weight: 172 lb (78 kg)  Height: 5\' 3"  (1.6 m)   Body mass index is 30.47 kg/m.  Advanced Directives 10/08/2020 07/14/2020 11/27/2019 05/29/2019 04/03/2019 02/02/2019 01/12/2019  Does Patient Have a Medical Advance Directive? No No No No No No No  Type of Advance Directive - - - - - - -  Does patient want to make changes to medical advance directive? - - - - No - Patient declined - -  Copy of [place] in Chart? - - - - - - -  Would patient like information on creating a medical advance directive? - - No - Patient declined No - Patient declined - - No - Patient declined    Current Medications (verified) Outpatient Encounter Medications as of 11/27/2020  Medication Sig   Calcium Carbonate-Vitamin D  (CALCIUM-VITAMIN D) 600-125 MG-UNIT TABS Take 1 tablet by mouth 2 (two) times a day.    montelukast (SINGULAIR) 10 MG tablet TAKE 1 TABLET BY MOUTH EVERYDAY AT BEDTIME (Patient taking differently: Take 10 mg by mouth at bedtime.)   Multiple Vitamins-Minerals (OCUVITE ADULT 50+) CAPS    omeprazole (PRILOSEC) 40 MG capsule TAKE 1 CAPSULE BY MOUTH EVERY DAY (Patient taking differently: Take 40 mg by mouth daily.)   triamcinolone (NASACORT ALLERGY 24HR) 55 MCG/ACT AERO nasal inhaler Place 2 sprays into the nose daily.   XARELTO 20 MG TABS tablet Take 20 mg by mouth at bedtime.   [DISCONTINUED] nystatin (MYCOSTATIN) 100000 UNIT/ML suspension Take 5 mLs (500,000 Units total) by mouth 4 (four) times daily. Swish, gargle and spit   [DISCONTINUED] traMADol (ULTRAM) 50 MG tablet Take 1 tablet (50 mg total) by mouth every 12 (twelve) hours as needed for moderate pain or severe pain.   No facility-administered encounter medications on file as of 11/27/2020.    Allergies (verified) Ciprofloxacin   History: Past Medical History:  Diagnosis Date   A-fib ([place])    Allergy    Anxiety    Cancer ([place]) 1991   left breast. treated with rads and lumpectomy   Cancer ([place]) 2020   newly diagnosed, also left breast   Cataract 2017   Cellulitis and abscess of toe of left foot 01/2018   GERD (gastroesophageal reflux disease)    Hx of therapeutic radiation 1991   left breast  Laryngopharyngeal reflux    sometimes food goes down wrong way and she ends up in extreme coughing/choking scenario   Personal history of radiation therapy 1991   left breast ca   Primary osteoarthritis    Thoracic aortic aneurysm (TAA) ([facility])    Past Surgical History:  Procedure Laterality Date   BREAST BIOPSY Right 02/22/2020   stereo bx, ribbon clip, path pending    BREAST LUMPECTOMY Left 1991   breast ca, lumpectomy   cataracts Bilateral    CYSTOCELE REPAIR  2010   EYE SURGERY Bilateral 2017   cataracts extracted   JOINT  REPLACEMENT     MASTECTOMY Left 07/25/2018   Invasive Mammary Carcinoma   MASTECTOMY W/ SENTINEL NODE BIOPSY Left 07/25/2018   Procedure: MASTECTOMY WITH SENTINEL LYMPH NODE BIOPSY;  Surgeon: Olean Ree, MD;  Location: [facility] ORS;  Service: General;  Laterality: Left;   OOPHORECTOMY  1995   ovarian mass - benign   REPLACEMENT TOTAL KNEE Bilateral 2012,2013   SKIN BIOPSY Left    arm growth   TONSILLECTOMY  1958   TOTAL SHOULDER ARTHROPLASTY Right 2016   Family History  Problem Relation Age of Onset   Hypertension Mother    Alzheimer's disease Mother    Hearing loss Mother    Stroke Father    Cancer Brother        prostate, rectal cancer   Atrial fibrillation Brother    Atrial fibrillation Brother    Stroke Maternal Uncle    Breast cancer Cousin 72       paternal x 2   Cancer Other        breast   Social History   Socioeconomic History   Marital status: Single    Spouse name: Not on file   Number of children: 0   Years of education: Not on file   Highest education level: Master's degree (e.g., MA, MS, MEng, MEd, MSW, MBA)  Occupational History   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    Comment: retired  Tobacco Use   Smoking status: Never   Smokeless tobacco: Never   Tobacco comments:    smoking cessation materials not required  Vaping Use   Vaping Use: Never used  Substance and Sexual Activity   Alcohol use: Yes    Comment: occasional   Drug use: Never   Sexual activity: Not Currently    Birth control/protection: None  Other Topics Concern   Not on file  Social History Narrative   Pt lives with business partner. Owner of [employer]   Social Determinants of Health   Financial Resource Strain: Low Risk    Difficulty of Paying Living Expenses: Not hard at all  Food Insecurity: No Food Insecurity   Worried About Charity fundraiser in the Last Year: Never true   Arboriculturist in the Last Year: Never true  Transportation Needs: No  Transportation Needs   Lack of Transportation (Medical): No   Lack of Transportation (Non-Medical): No  Physical Activity: Sufficiently Active   Days of Exercise per Week: 7 days   Minutes of Exercise per Session: 40 min  Stress: No Stress Concern Present   Feeling of Stress : Not at all  Social Connections: Socially Isolated   Frequency of Communication with Friends and Family: More than three times a week   Frequency of Social Gatherings with Friends and Family: More than three times a week   Attends Religious Services: Never  Active Member of Clubs or Organizations: No   Attends Archivist Meetings: Never   Marital Status: Never married    Tobacco Counseling Counseling given: Not Answered Tobacco comments: smoking cessation materials not required   Clinical Intake:  Pre-visit preparation completed: Yes  Pain : No/denies pain     BMI - recorded: 30.47 Nutritional Status: BMI > 30  Obese Nutritional Risks: None Diabetes: No  How often do you need to have someone help you when you read instructions, pamphlets, or other written materials from your doctor or pharmacy?: 1 - Never    Interpreter Needed?: No  Information entered by :: [name] LPN   Activities of Daily Living In your present state of health, do you have any difficulty performing the following activities: 11/27/2020 07/15/2020  Hearing? Y N  Comment declines hearing aid -  Vision? N N  Difficulty concentrating or making decisions? N N  Walking or climbing stairs? Y N  Dressing or bathing? N N  Doing errands, shopping? Y N  Comment pt does not drive Facilities manager and eating ? N -  Using the Toilet? N -  In the past six months, have you accidently leaked urine? Y -  Comment wears pads for protection -  Do you have problems with loss of bowel control? N -  Managing your Medications? N -  Managing your Finances? N -  Housekeeping or managing your Housekeeping? N -  Some recent data  might be hidden    Patient Care Team: Glean Hess, MD as PCP - General (Internal Medicine) Marlaine Hind, MD as Consulting Physician (Physical Medicine and Rehabilitation) Eustace Moore, MD as Consulting Physician (Neurosurgery) Tyler Pita, MD as Consulting Physician (Pulmonary Disease) Olean Ree, MD as Consulting Physician (General Surgery)  Indicate any recent Medical Services you may have received from other than [facility] providers in the past year (date may be approximate).     Assessment:   This is a routine wellness examination for Saidee.  Hearing/Vision screen Hearing Screening - Comments:: Pt complains of mild hearing difficulty Vision Screening - Comments:: [facility] screen[facility]  Dietary issues and exercise activities discussed: Current Exercise Habits: Home exercise routine, Type of exercise: Other - see comments (exercise bike, gardening), Time (Minutes): 40, Frequency (Times/Week): 7, Weekly Exercise (Minutes/Week): 280, Intensity: Moderate, Exercise limited by: orthopedic condition(s)   Goals Addressed             This Visit's Progress    DIET - INCREASE WATER INTAKE   On track    Recommend to drink at least 6-8 8oz glasses of water per day.        Depression Screen PHQ 2/9 Scores 11/27/2020 07/22/2020 07/22/2020 06/05/2020 12/12/2019 11/27/2019 11/28/2018  PHQ - 2 Score 0 0 0 0 2 2 1   PHQ- 9 Score - 1 0 1 2 - -    Fall Risk Fall Risk  11/27/2020 07/22/2020 07/22/2020 06/05/2020 12/12/2019  Falls in the past year? 0 1 0 1 1  Number falls in past yr: 0 0 0 0 1  Injury with Fall? 0 0 0 0 0  Risk for fall due to : Impaired balance/gait History of fall(s);Impaired balance/gait - - History of fall(s);Impaired balance/gait  Risk for fall due to: Comment - - - - -  Follow up Falls prevention discussed Falls evaluation completed Falls evaluation completed Falls evaluation completed Falls evaluation completed    [place]  PERTAINING TO THE HOME:  Any stairs in or around the home? Yes  If so, are there any without handrails? Yes  - 3 steps outside Home free of loose throw rugs in walkways, pet beds, electrical cords, etc? No  - advised removal for fall prevention Adequate lighting in your home to reduce risk of falls? Yes   ASSISTIVE DEVICES UTILIZED TO PREVENT FALLS:  Life alert? No  Use of a cane, walker or w/c? Yes  Grab bars in the bathroom? No  Shower chair or bench in shower? Yes  Elevated toilet seat or a handicapped toilet? Yes   TIMED UP AND GO:  Was the test performed? No . Telephonic visit.   Cognitive Function: Normal cognitive status assessed by direct observation by this Nurse Health Advisor. No abnormalities found.       6CIT Screen 11/27/2019 11/21/2018 11/17/2017  What Year? 0 points 0 points 0 points  What month? 0 points 0 points 0 points  What time? 0 points 0 points 0 points  Count back from 20 0 points 0 points 0 points  Months in reverse 0 points 0 points 0 points  Repeat phrase 0 points 0 points 0 points  Total Score 0 0 0    Immunizations Immunization History  Administered Date(s) Administered   Influenza Split 06/28/2012   Influenza, High Dose Seasonal PF 03/14/2018   Influenza-Unspecified 04/15/2020   PFIZER(Purple Top)SARS-COV-2 Vaccination 08/04/2019, 08/25/2019, 03/29/2020   Pneumococcal Conjugate-13 07/04/2014   Pneumococcal Polysaccharide-23 06/16/2015   Tdap 03/11/2012   Zoster, Live 02/25/2016    TDAP status: Up to date  Flu Vaccine status: Up to date  Pneumococcal vaccine status: Up to date  Covid-19 vaccine status: Completed vaccines  Qualifies for Shingles Vaccine? Yes   Zostavax completed Yes   Shingrix Completed?: No.    Education has been provided regarding the importance of this vaccine. Patient has been advised to call insurance company to determine out of pocket expense if they have not yet received this vaccine. Advised may also  receive vaccine at local pharmacy or Health Dept. Verbalized acceptance and understanding.  Screening Tests Health Maintenance  Topic Date Due   Zoster Vaccines- Shingrix (1 of 2) Never done   COVID-19 Vaccine (4 - Booster for Pfizer series) 06/29/2020   INFLUENZA VACCINE  01/13/2021   MAMMOGRAM  02/12/2021   TETANUS/TDAP  03/11/2022   DEXA SCAN  Completed   PNA vac Low Risk Adult  Completed   HPV VACCINES  Aged Out    Health Maintenance  Health Maintenance Due  Topic Date Due   Zoster Vaccines- Shingrix (1 of 2) Never done   COVID-19 Vaccine (4 - Booster for Pfizer series) 06/29/2020    Colorectal cancer screening: No longer required.   Mammogram status: Completed 01/30/20. Repeat every year. Ordered by Dr. Hampton Abbot  Bone Density status: Completed 11/29/17. Results reflect: Bone density results: OSTEOPENIA. Repeat every 2 years.  Lung Cancer Screening: (Low Dose CT Chest recommended if Age 46-80 years, 30 pack-year currently smoking OR have quit w/in 15years.) does not qualify.   Additional Screening:  Hepatitis C Screening: does not qualify.   Vision Screening: Recommended annual ophthalmology exams for early detection of glaucoma and other disorders of the eye. Is the patient up to date with their annual eye exam?  Yes  Who is the provider or what is the name of the office in which the patient attends annual eye exams? [facility].   Dental Screening: Recommended annual dental  exams for proper oral hygiene  Community Resource Referral / Chronic Care Management: CRR required this visit?  No   CCM required this visit?  No      Plan:     I have personally reviewed and noted the following in the patient's chart:   Medical and social history Use of alcohol, tobacco or illicit drugs  Current medications and supplements including opioid prescriptions.  Functional ability and status Nutritional status Physical activity Advanced directives List of other  physicians Hospitalizations, surgeries, and ER visits in previous 12 months Vitals Screenings to include cognitive, depression, and falls Referrals and appointments  In addition, I have reviewed and discussed with patient certain preventive protocols, quality metrics, and best practice recommendations. A written personalized care plan for preventive services as well as general preventive health recommendations were provided to patient.     [name], LPN   3/83/2919   Nurse Notes: none

## 2020-11-27 NOTE — Patient Instructions (Signed)
[name] , Thank you for taking time to come for your Medicare Wellness Visit. I appreciate your ongoing commitment to your health goals. Please review the following plan we discussed and let me know if I can assist you in the future.   Screening recommendations/referrals: Colonoscopy: no longer required Mammogram: done 01/30/20 Bone Density: done 11/29/17; please discuss repeat screening recommendations with Dr. Army Mitchell at your next appointment Recommended yearly ophthalmology/optometry visit for glaucoma screening and checkup Recommended yearly dental visit for hygiene and checkup  Vaccinations: Influenza vaccine: done 04/15/20 Pneumococcal vaccine: done 07/04/14 Tdap vaccine: done 03/11/12 Shingles vaccine: Shingrix discussed. Please contact your pharmacy for coverage information.  Covid-19:done 08/04/19, 08/25/19 & 03/29/20  Advanced directives: Advance directive discussed with you today. I have provided a copy for you to complete at home and have notarized. Once this is complete please bring a copy in to our office so we can scan it into your chart.   Conditions/risks identified: Keep up the great work!  Next appointment: Follow up in one year for your annual wellness visit   Preventive Care 65 Years and Older, Female Preventive care refers to lifestyle choices and visits with your health care provider that can promote health and wellness. What does preventive care include? A yearly physical exam. This is also called an annual well check. Dental exams once or twice a year. Routine eye exams. Ask your health care provider how often you should have your eyes checked. Personal lifestyle choices, including: Daily care of your teeth and gums. Regular physical activity. Eating a healthy diet. Avoiding tobacco and drug use. Limiting alcohol use. Practicing safe sex. Taking low-dose aspirin every day. Taking vitamin and mineral supplements as recommended by your health care  provider. What happens during an annual well check? The services and screenings done by your health care provider during your annual well check will depend on your age, overall health, lifestyle risk factors, and family history of disease. Counseling  Your health care provider may ask you questions about your: Alcohol use. Tobacco use. Drug use. Emotional well-being. Home and relationship well-being. Sexual activity. Eating habits. History of falls. Memory and ability to understand (cognition). Work and work Statistician. Reproductive health. Screening  You may have the following tests or measurements: Height, weight, and BMI. Blood pressure. Lipid and cholesterol levels. These may be checked every 5 years, or more frequently if you are over 60 years old. Skin check. Lung cancer screening. You may have this screening every year starting at age 6 if you have a 30-pack-year history of smoking and currently smoke or have quit within the past 15 years. Fecal occult blood test (FOBT) of the stool. You may have this test every year starting at age 51. Flexible sigmoidoscopy or colonoscopy. You may have a sigmoidoscopy every 5 years or a colonoscopy every 10 years starting at age 36. Hepatitis C blood test. Hepatitis B blood test. Sexually transmitted disease (STD) testing. Diabetes screening. This is done by checking your blood sugar (glucose) after you have not eaten for a while (fasting). You may have this done every 1-3 years. Bone density scan. This is done to screen for osteoporosis. You may have this done starting at age 39. Mammogram. This may be done every 1-2 years. Talk to your health care provider about how often you should have regular mammograms. Talk with your health care provider about your test results, treatment options, and if necessary, the need for more tests. Vaccines  Your health care provider may  recommend certain vaccines, such as: Influenza vaccine. This is  recommended every year. Tetanus, diphtheria, and acellular pertussis (Tdap, Td) vaccine. You may need a Td booster every 10 years. Zoster vaccine. You may need this after age 68. Pneumococcal 13-valent conjugate (PCV13) vaccine. One dose is recommended after age 67. Pneumococcal polysaccharide (PPSV23) vaccine. One dose is recommended after age 5. Talk to your health care provider about which screenings and vaccines you need and how often you need them. This information is not intended to replace advice given to you by your health care provider. Make sure you discuss any questions you have with your health care provider. Document Released: 06/28/2015 Document Revised: 02/19/2016 Document Reviewed: 04/02/2015 Elsevier Interactive Patient Education  2017 [place] Prevention in the Home Falls can cause injuries. They can happen to people of all ages. There are many things you can do to make your home safe and to help prevent falls. What can I do on the outside of my home? Regularly fix the edges of walkways and driveways and fix any cracks. Remove anything that might make you trip as you walk through a door, such as a raised step or threshold. Trim any bushes or trees on the path to your home. Use bright outdoor lighting. Clear any walking paths of anything that might make someone trip, such as rocks or tools. Regularly check to see if handrails are loose or broken. Make sure that both sides of any steps have handrails. Any raised decks and porches should have guardrails on the edges. Have any leaves, snow, or ice cleared regularly. Use sand or salt on walking paths during winter. Clean up any spills in your garage right away. This includes oil or grease spills. What can I do in the bathroom? Use night lights. Install grab bars by the toilet and in the tub and shower. Do not use towel bars as grab bars. Use non-skid mats or decals in the tub or shower. If you need to sit down in  the shower, use a plastic, non-slip stool. Keep the floor dry. Clean up any water that spills on the floor as soon as it happens. Remove soap buildup in the tub or shower regularly. Attach bath mats securely with double-sided non-slip rug tape. Do not have throw rugs and other things on the floor that can make you trip. What can I do in the bedroom? Use night lights. Make sure that you have a light by your bed that is easy to reach. Do not use any sheets or blankets that are too big for your bed. They should not hang down onto the floor. Have a firm chair that has side arms. You can use this for support while you get dressed. Do not have throw rugs and other things on the floor that can make you trip. What can I do in the kitchen? Clean up any spills right away. Avoid walking on wet floors. Keep items that you use a lot in easy-to-reach places. If you need to reach something above you, use a strong step stool that has a grab bar. Keep electrical cords out of the way. Do not use floor polish or wax that makes floors slippery. If you must use wax, use non-skid floor wax. Do not have throw rugs and other things on the floor that can make you trip. What can I do with my stairs? Do not leave any items on the stairs. Make sure that there are handrails on both sides of  the stairs and use them. Fix handrails that are broken or loose. Make sure that handrails are as long as the stairways. Check any carpeting to make sure that it is firmly attached to the stairs. Fix any carpet that is loose or worn. Avoid having throw rugs at the top or bottom of the stairs. If you do have throw rugs, attach them to the floor with carpet tape. Make sure that you have a light switch at the top of the stairs and the bottom of the stairs. If you do not have them, ask someone to add them for you. What else can I do to help prevent falls? Wear shoes that: Do not have high heels. Have rubber bottoms. Are comfortable  and fit you well. Are closed at the toe. Do not wear sandals. If you use a stepladder: Make sure that it is fully opened. Do not climb a closed stepladder. Make sure that both sides of the stepladder are locked into place. Ask someone to hold it for you, if possible. Clearly mark and make sure that you can see: Any grab bars or handrails. First and last steps. Where the edge of each step is. Use tools that help you move around (mobility aids) if they are needed. These include: Canes. Walkers. Scooters. Crutches. Turn on the lights when you go into a dark area. Replace any light bulbs as soon as they burn out. Set up your furniture so you have a clear path. Avoid moving your furniture around. If any of your floors are uneven, fix them. If there are any pets around you, be aware of where they are. Review your medicines with your doctor. Some medicines can make you feel dizzy. This can increase your chance of falling. Ask your doctor what other things that you can do to help prevent falls. This information is not intended to replace advice given to you by your health care provider. Make sure you discuss any questions you have with your health care provider. Document Released: 03/28/2009 Document Revised: 11/07/2015 Document Reviewed: 07/06/2014 Elsevier Interactive Patient Education  2017 [employer].

## 2020-11-28 ENCOUNTER — Ambulatory Visit
Admission: RE | Admit: 2020-11-28 | Discharge: 2020-11-28 | Disposition: A | Discharge status: Home / Self Care | Payer: Medicare Other | Source: Ambulatory Visit | Attending: Otolaryngology | Admitting: Otolaryngology

## 2020-11-28 ENCOUNTER — Other Ambulatory Visit: Payer: Self-pay

## 2020-11-28 DIAGNOSIS — R059 Cough, unspecified: Secondary | ICD-10-CM | POA: Diagnosis not present

## 2020-11-28 DIAGNOSIS — R1310 Dysphagia, unspecified: Secondary | ICD-10-CM | POA: Insufficient documentation

## 2020-11-28 DIAGNOSIS — Z0389 Encounter for observation for other suspected diseases and conditions ruled out: Secondary | ICD-10-CM | POA: Diagnosis not present

## 2020-11-29 NOTE — Progress Notes (Signed)
Modified Barium Swallow Progress Note  Patient Details  Name: Connie Mitchell MRN: [id number] Date of Birth: Aug 14, 1933  Today's Date: 11/29/2020  Modified Barium Swallow completed.  Full report located under Chart Review in the Imaging Section.  Brief recommendations include the following:  Clinical Impression  Pt presents with adequate oropharyngeal abilities when consuming thin liquids, nectar thick liquids, puree, graham cracker and whole barium tablet with thin liquids via cup and straw. Penetration and aspiration were not observed. There appeared to be instances of bolus stasis just below the UES but unable to further assess. During such instance, pt reported globus sensation to her throat. Highly suspect that some of pt's s/s are related to GERD and LPR. Education provided to pt on findings of this assessment and possible esophageal assessment. All questions were answered to pt's satisfaction.   Swallow Evaluation Recommendations   Recommended Consults: Consider esophageal assessment   SLP Diet Recommendations: Regular solids;Thin liquid   Liquid Administration via: Cup;Straw   Medication Administration: Whole meds with liquid   Supervision: Patient able to self feed   Compensations: Minimize environmental distractions;Slow rate;Small sips/bites   Postural Changes: Remain semi-upright after after feeds/meals (Comment);Seated upright at 90 degrees (reviewed reflux precautions with pt)   Oral Care Recommendations: Oral care BID       Connie Mitchell B. Rutherford Nail M.S., CCC-SLP, [place] Office [phone]  Connie Mitchell Rutherford Nail 11/29/2020,8:41 AM

## 2020-12-17 ENCOUNTER — Encounter: Payer: Self-pay | Admitting: Internal Medicine

## 2020-12-17 ENCOUNTER — Other Ambulatory Visit: Payer: Self-pay | Admitting: Internal Medicine

## 2020-12-17 ENCOUNTER — Other Ambulatory Visit: Payer: Self-pay

## 2020-12-17 ENCOUNTER — Ambulatory Visit (INDEPENDENT_AMBULATORY_CARE_PROVIDER_SITE_OTHER): Payer: Medicare Other | Admitting: Internal Medicine

## 2020-12-17 VITALS — BP 116/76 | HR 68 | Temp 97.6°F | Ht 63.0 in | Wt 168.0 lb

## 2020-12-17 DIAGNOSIS — K219 Gastro-esophageal reflux disease without esophagitis: Secondary | ICD-10-CM

## 2020-12-17 DIAGNOSIS — R7989 Other specified abnormal findings of blood chemistry: Secondary | ICD-10-CM | POA: Diagnosis not present

## 2020-12-17 DIAGNOSIS — Z1231 Encounter for screening mammogram for malignant neoplasm of breast: Secondary | ICD-10-CM | POA: Diagnosis not present

## 2020-12-17 DIAGNOSIS — I480 Paroxysmal atrial fibrillation: Secondary | ICD-10-CM | POA: Diagnosis not present

## 2020-12-17 DIAGNOSIS — I712 Thoracic aortic aneurysm, without rupture: Secondary | ICD-10-CM

## 2020-12-17 DIAGNOSIS — E785 Hyperlipidemia, unspecified: Secondary | ICD-10-CM | POA: Diagnosis not present

## 2020-12-17 DIAGNOSIS — D6869 Other thrombophilia: Secondary | ICD-10-CM

## 2020-12-17 DIAGNOSIS — I7121 Aneurysm of the ascending aorta, without rupture: Secondary | ICD-10-CM

## 2020-12-17 NOTE — Progress Notes (Signed)
Date:  12/17/2020   Name:  Connie Mitchell   DOB:  [birth year]   MRN:  [id number]   Chief Complaint: Annual Exam (Breast exam no pap) Connie Mitchell is a 87 y.o. female who presents today for her Complete Annual Exam. She feels well. She reports exercising exercise bike 4 times a week. She reports she is sleeping well. Breast complaints none.  Mammogram: 01/2020 abnormal right - bx: fibroadenomatoid changes/benign calcification DEXA: 11/2017 osteopenia Colonoscopy: aged out  Immunization History  Administered Date(s) Administered   Influenza Split 06/28/2012   Influenza, High Dose Seasonal PF 03/14/2018   Influenza-Unspecified 04/15/2020   PFIZER(Purple Top)SARS-COV-2 Vaccination 08/04/2019, 08/25/2019, 03/29/2020   Pneumococcal Conjugate-13 07/04/2014   Pneumococcal Polysaccharide-23 06/16/2015   Tdap 03/11/2012   Zoster, Live 02/25/2016    Gastroesophageal Reflux She complains of dysphagia and globus sensation. She reports no abdominal pain, no chest pain, no coughing, no heartburn or no wheezing. Pertinent negatives include no fatigue. She has tried a PPI for the symptoms.  Back Pain This is a new problem. The current episode started in the past 7 days. The problem occurs constantly. The problem has been gradually improving since onset. The pain is present in the lumbar spine and sacro-iliac. The quality of the pain is described as aching. The pain does not radiate. The pain is mild. Pertinent negatives include no abdominal pain, chest pain, dysuria, fever or headaches. Risk factors: almost fell at home and strained her lower back trying to prevent a fall.   Lab Results  Component Value Date   CREATININE 0.64 10/08/2020   BUN 17 10/08/2020   NA 135 10/08/2020   K 3.7 10/08/2020   CL 101 10/08/2020   CO2 26 10/08/2020   Lab Results  Component Value Date   CHOL 174 12/12/2019   HDL 56 12/12/2019   LDLCALC 101 (H) 12/12/2019   TRIG 87 12/12/2019   CHOLHDL 3.1 12/12/2019   Lab  Results  Component Value Date   TSH 5.100 (H) 06/05/2020   No results found for: HGBA1C Lab Results  Component Value Date   WBC 12.9 (H) 10/08/2020   HGB 12.5 10/08/2020   HCT 36.9 10/08/2020   MCV 91.8 10/08/2020   PLT 194 10/08/2020   Lab Results  Component Value Date   ALT 22 10/08/2020   AST 17 10/08/2020   ALKPHOS 57 10/08/2020   BILITOT 0.6 10/08/2020     Review of Systems  Constitutional:  Negative for chills, fatigue and fever.  HENT:  Positive for trouble swallowing. Negative for congestion, hearing loss, tinnitus and voice change.   Eyes:  Negative for visual disturbance.  Respiratory:  Negative for cough, chest tightness, shortness of breath and wheezing.   Cardiovascular:  Negative for chest pain, palpitations and leg swelling.  Gastrointestinal:  Positive for dysphagia. Negative for abdominal pain, constipation, diarrhea, heartburn and vomiting.  Endocrine: Negative for polydipsia and polyuria.  Genitourinary:  Negative for dysuria, frequency, genital sores, vaginal bleeding and vaginal discharge.  Musculoskeletal:  Positive for back pain. Negative for arthralgias, gait problem and joint swelling.  Skin:  Negative for color change and rash.  Neurological:  Negative for dizziness, tremors, light-headedness and headaches.  Hematological:  Negative for adenopathy. Does not bruise/bleed easily.  Psychiatric/Behavioral:  Negative for dysphoric mood and sleep disturbance. The patient is not nervous/anxious.    Patient Active Problem List   Diagnosis Date Noted   Enteritis 07/14/2020   Rectal bleed 07/14/2020   Unspecified atrial fibrillation ([place])  07/14/2020   Aortic atherosclerosis ([place]) 12/12/2019   Axillary adenopathy 01/01/2019   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[place]) 12/30/2018   Osteopenia of neck of right femur 07/12/2018   Malignant neoplasm of upper-outer quadrant of left female breast ([place]) 07/07/2018   Thoracic ascending aortic aneurysm ([place]) 05/27/2018   LPRD  (laryngopharyngeal reflux disease) 03/17/2018   Chronic foot pain 11/25/2017   Lumbar degenerative disc disease 09/24/2017   Hx of Lyme disease 09/24/2017   Hx of basal cell carcinoma 09/24/2017   Nuclear sclerotic cataract of left eye 08/09/2015   Osteoporosis 07/04/2014   Primary localized osteoarthrosis of shoulder region 12/13/2012   Osteoarthritis 12/18/2011    Allergies  Allergen Reactions   Ciprofloxacin Nausea And Vomiting    Past Surgical History:  Procedure Laterality Date   BREAST BIOPSY Right 02/22/2020   stereo bx, ribbon clip, path pending    BREAST LUMPECTOMY Left 1991   breast ca, lumpectomy   cataracts Bilateral    CYSTOCELE REPAIR  2010   EYE SURGERY Bilateral 2017   cataracts extracted   JOINT REPLACEMENT     MASTECTOMY Left 07/25/2018   Invasive Mammary Carcinoma   MASTECTOMY W/ SENTINEL NODE BIOPSY Left 07/25/2018   Procedure: MASTECTOMY WITH SENTINEL LYMPH NODE BIOPSY;  Surgeon: Olean Ree, MD;  Location: [facility] ORS;  Service: General;  Laterality: Left;   OOPHORECTOMY  1995   ovarian mass - benign   REPLACEMENT TOTAL KNEE Bilateral 2012,2013   SKIN BIOPSY Left    arm growth   TONSILLECTOMY  1958   TOTAL SHOULDER ARTHROPLASTY Right 2016    Social History   Tobacco Use   Smoking status: Never   Smokeless tobacco: Never   Tobacco comments:    smoking cessation materials not required  Vaping Use   Vaping Use: Never used  Substance Use Topics   Alcohol use: Yes    Comment: occasional   Drug use: Never     Medication list has been reviewed and updated.  Current Meds  Medication Sig   Calcium Carbonate-Vitamin D (CALCIUM-VITAMIN D) 600-125 MG-UNIT TABS Take 1 tablet by mouth 2 (two) times a day.    montelukast (SINGULAIR) 10 MG tablet TAKE 1 TABLET BY MOUTH EVERYDAY AT BEDTIME (Patient taking differently: Take 10 mg by mouth at bedtime.)   Multiple Vitamins-Minerals (OCUVITE ADULT 50+) CAPS    omeprazole (PRILOSEC) 40 MG capsule TAKE 1  CAPSULE BY MOUTH EVERY DAY (Patient taking differently: Take 40 mg by mouth daily.)   triamcinolone (NASACORT) 55 MCG/ACT AERO nasal inhaler Place 2 sprays into the nose daily.   XARELTO 20 MG TABS tablet Take 20 mg by mouth at bedtime.    PHQ 2/9 Scores 12/17/2020 11/27/2020 07/22/2020 07/22/2020  PHQ - 2 Score 0 0 0 0  PHQ- 9 Score 2 - 1 0    GAD 7 : Generalized Anxiety Score 12/17/2020 07/22/2020 07/22/2020 06/05/2020  Nervous, Anxious, on Edge 0 2 0 0  Control/stop worrying 0 2 0 0  Worry too much - different things 0 2 0 0  Trouble relaxing 0 0 0 0  Restless 0 0 0 0  Easily annoyed or irritable 0 2 0 0  Afraid - awful might happen 0 0 0 0  Total GAD 7 Score 0 8 0 0  Anxiety Difficulty Not difficult at all - Not difficult at all -    BP Readings from Last 3 Encounters:  12/17/20 116/76  11/27/20 120/72  10/08/20 110/79    Physical Exam Vitals and  nursing note reviewed.  Constitutional:      General: She is not in acute distress.    Appearance: She is well-developed.  HENT:     Head: Normocephalic and atraumatic.     Right Ear: Tympanic membrane and ear canal normal.     Left Ear: Tympanic membrane and ear canal normal.     Nose:     Right Sinus: No maxillary sinus tenderness.     Left Sinus: No maxillary sinus tenderness.  Eyes:     General: No scleral icterus.       Right eye: No discharge.        Left eye: No discharge.     Conjunctiva/sclera: Conjunctivae normal.  Neck:     Thyroid: No thyromegaly.     Vascular: No carotid bruit.  Cardiovascular:     Rate and Rhythm: Normal rate and regular rhythm.     Pulses: Normal pulses.     Heart sounds: Normal heart sounds.  Pulmonary:     Effort: Pulmonary effort is normal. No respiratory distress.     Breath sounds: No wheezing.  Chest:  Breasts:    Right: No mass, nipple discharge, skin change or tenderness.     Left: Absent. No mass, nipple discharge, skin change or tenderness.  Abdominal:     General: Bowel sounds are  normal.     Palpations: Abdomen is soft.     Tenderness: There is no abdominal tenderness.  Musculoskeletal:     Cervical back: Normal range of motion. No erythema.     Right lower leg: No edema.     Left lower leg: No edema.     Comments: Mild tenderness to palpation in left SI region and both sides of the lumbar spine; no bony tenderness  Lymphadenopathy:     Cervical: No cervical adenopathy.  Skin:    General: Skin is warm and dry.     Capillary Refill: Capillary refill takes less than 2 seconds.     Findings: No rash.  Neurological:     General: No focal deficit present.     Mental Status: She is alert and oriented to person, place, and time.     Cranial Nerves: No cranial nerve deficit.     Sensory: No sensory deficit.     Gait: Gait abnormal (uses rolator).     Deep Tendon Reflexes: Reflexes are normal and symmetric.  Psychiatric:        Attention and Perception: Attention normal.        Mood and Affect: Mood normal.    Wt Readings from Last 3 Encounters:  11/27/20 172 lb (78 kg)  10/08/20 169 lb 15.6 oz (77.1 kg)  07/22/20 170 lb (77.1 kg)    BP 116/76   Pulse 68   Temp 97.6 F (36.4 C) (Oral)   Ht 5\' 3"  (1.6 m)   SpO2 96%   BMI 30.47 kg/m   Assessment and Plan: 1. Paroxysmal A-fib (HCC) In SR today; cotinue xarelto for antiocoagulation due to elevated CHADs score - Comprehensive metabolic panel  2. Acquired thrombophilia ([place]) No bleeding issue snoted  3. LPRD (laryngopharyngeal reflux disease) Followed by Dr. Kathyrn Sheriff Swallowing study normal - recommend continued daily PPI - CBC with Differential/Platelet  4. Thoracic ascending aortic aneurysm [facility]) Followed by Cardiology Last scan 01/2020 was stable  5. Mild hyperlipidemia Pt has declined statin therapy - Lipid panel  6. Elevated TSH Check labs and advise - TSH + free T4  7. Encounter for  screening mammogram for breast cancer Due for right screening mammo in August - MM Digital Screening  Unilat R; Future   Partially dictated using Editor, commissioning. Any errors are unintentional.  Halina Maidens, MD [facility]  12/17/2020

## 2020-12-18 LAB — COMPREHENSIVE METABOLIC PANEL
ALT: 17 IU/L (ref 0–32)
AST: 20 IU/L (ref 0–40)
Albumin/Globulin Ratio: 1.6 (ref 1.2–2.2)
Albumin: 4.4 g/dL (ref 3.6–4.6)
Alkaline Phosphatase: 64 IU/L (ref 44–121)
BUN/Creatinine Ratio: 26 (ref 12–28)
BUN: 19 mg/dL (ref 8–27)
Bilirubin Total: 0.7 mg/dL (ref 0.0–1.2)
CO2: 24 mmol/L (ref 20–29)
Calcium: 9.5 mg/dL (ref 8.7–10.3)
Chloride: 102 mmol/L (ref 96–106)
Creatinine, Ser: 0.73 mg/dL (ref 0.57–1.00)
Globulin, Total: 2.8 g/dL (ref 1.5–4.5)
Glucose: 85 mg/dL (ref 65–99)
Potassium: 4 mmol/L (ref 3.5–5.2)
Sodium: 141 mmol/L (ref 134–144)
Total Protein: 7.2 g/dL (ref 6.0–8.5)
eGFR: 81 mL/min/{1.73_m2} (ref >59)

## 2020-12-18 LAB — CBC WITH DIFFERENTIAL/PLATELET
Basophils Absolute: 0 10*3/uL (ref 0.0–0.2)
Basos: 1 %
EOS (ABSOLUTE): 0.1 10*3/uL (ref 0.0–0.4)
Eos: 2 %
Hematocrit: 45 % (ref 34.0–46.6)
Hemoglobin: 15.1 g/dL (ref 11.1–15.9)
Immature Grans (Abs): 0 10*3/uL (ref 0.0–0.1)
Immature Granulocytes: 0 %
Lymphocytes Absolute: 1.5 10*3/uL (ref 0.7–3.1)
Lymphs: 26 %
MCH: 31.4 pg (ref 26.6–33.0)
MCHC: 33.6 g/dL (ref 31.5–35.7)
MCV: 94 fL (ref 79–97)
Monocytes Absolute: 0.4 10*3/uL (ref 0.1–0.9)
Monocytes: 6 %
Neutrophils Absolute: 3.8 10*3/uL (ref 1.4–7.0)
Neutrophils: 65 %
RBC: 4.81 x10E6/uL (ref 3.77–5.28)
RDW: 14.3 % (ref 11.7–15.4)
WBC: 5.8 10*3/uL (ref 3.4–10.8)

## 2020-12-18 LAB — TSH+FREE T4
Free T4: 1.24 ng/dL (ref 0.82–1.77)
TSH: 4.38 u[IU]/mL (ref 0.450–4.500)

## 2020-12-18 LAB — LIPID PANEL
Chol/HDL Ratio: 3.3 ratio (ref 0.0–4.4)
Cholesterol, Total: 195 mg/dL (ref 100–199)
HDL: 60 mg/dL (ref >39)
LDL Chol Calc (NIH): 120 mg/dL — ABNORMAL HIGH (ref 0–99)
Triglycerides: 80 mg/dL (ref 0–149)
VLDL Cholesterol Cal: 15 mg/dL (ref 5–40)

## 2020-12-28 ENCOUNTER — Other Ambulatory Visit: Payer: Self-pay | Admitting: Internal Medicine

## 2020-12-28 DIAGNOSIS — R058 Other specified cough: Secondary | ICD-10-CM

## 2020-12-28 NOTE — Telephone Encounter (Signed)
Requested Prescriptions  Pending Prescriptions Disp Refills  . omeprazole (PRILOSEC) 40 MG capsule [Pharmacy Med Name: OMEPRAZOLE DR 40 MG CAPSULE] 90 capsule 1    Sig: TAKE 1 CAPSULE BY MOUTH EVERY DAY     Gastroenterology: Proton Pump Inhibitors Passed - 12/28/2020  6:14 AM      Passed - Valid encounter within last 12 months    Recent Outpatient Visits          1 week ago Paroxysmal A-fib [facility])   [facility] Glean Hess, MD   5 months ago [facility] Glean Hess, MD   6 months ago Palpitations   [facility] Glean Hess, MD   1 year ago LPRD (laryngopharyngeal reflux disease)   [facility] Glean Hess, MD   2 years ago Osteoporosis, unspecified osteoporosis type, unspecified pathological fracture presence   [facility] Glean Hess, MD      Future Appointments            In 11 months Glean Hess, MD [facility]           . montelukast (SINGULAIR) 10 MG tablet [Pharmacy Med Name: MONTELUKAST SOD 10 MG TABLET] 90 tablet     Sig: TAKE 1 TABLET BY MOUTH EVERYDAY AT BEDTIME     Pulmonology:  Leukotriene Inhibitors Passed - 12/28/2020  6:14 AM      Passed - Valid encounter within last 12 months    Recent Outpatient Visits          1 week ago Paroxysmal A-fib [facility])   [facility] Glean Hess, MD   5 months ago [facility] Glean Hess, MD   6 months ago Palpitations   [facility] Glean Hess, MD   1 year ago LPRD (laryngopharyngeal reflux disease)   [facility] Glean Hess, MD   2 years ago Osteoporosis, unspecified osteoporosis type, unspecified pathological fracture presence   [facility] Glean Hess, MD      Future Appointments            In 11 months Army Melia Jesse Sans, MD [facility], [facility]

## 2020-12-28 NOTE — Telephone Encounter (Signed)
Requested medication (s) are due for refill today: yes  Requested medication (s) are on the active medication list: historical med  Last refill:  12/17/20  Future visit scheduled: yes  Notes to clinic:  historical provider   Requested Prescriptions  Pending Prescriptions Disp Refills   montelukast (SINGULAIR) 10 MG tablet [Pharmacy Med Name: MONTELUKAST SOD 10 MG TABLET] 90 tablet     Sig: TAKE 1 TABLET BY MOUTH EVERYDAY AT BEDTIME      Pulmonology:  Leukotriene Inhibitors Passed - 12/28/2020  6:14 AM      Passed - Valid encounter within last 12 months    Recent Outpatient Visits           1 week ago Paroxysmal A-fib [facility])   [facility] Glean Hess, MD   5 months ago [facility] Glean Hess, MD   6 months ago Palpitations   [facility] Glean Hess, MD   1 year ago LPRD (laryngopharyngeal reflux disease)   [facility] Glean Hess, MD   2 years ago Osteoporosis, unspecified osteoporosis type, unspecified pathological fracture presence   [facility] Glean Hess, MD       Future Appointments             In 11 months Army Melia Jesse Sans, MD [facility], [facility]              Signed Prescriptions Disp Refills   omeprazole (PRILOSEC) 40 MG capsule 90 capsule 1    Sig: TAKE 1 CAPSULE BY MOUTH EVERY DAY      Gastroenterology: Proton Pump Inhibitors Passed - 12/28/2020  6:14 AM      Passed - Valid encounter within last 12 months    Recent Outpatient Visits           1 week ago Paroxysmal A-fib [facility])   [facility] Glean Hess, MD   5 months ago [facility] Glean Hess, MD   6 months ago Palpitations   [facility] Glean Hess, MD   1 year ago LPRD (laryngopharyngeal reflux disease)   [facility] Glean Hess, MD   2 years ago Osteoporosis, unspecified osteoporosis type, unspecified pathological  fracture presence   [facility] Glean Hess, MD       Future Appointments             In 11 months Army Melia Jesse Sans, MD [facility], [facility]

## 2021-01-01 ENCOUNTER — Other Ambulatory Visit: Payer: Self-pay | Admitting: Internal Medicine

## 2021-01-04 DIAGNOSIS — Z23 Encounter for immunization: Secondary | ICD-10-CM | POA: Diagnosis not present

## 2021-01-04 DIAGNOSIS — U071 COVID-19: Secondary | ICD-10-CM | POA: Diagnosis not present

## 2021-01-13 DIAGNOSIS — H35371 Puckering of macula, right eye: Secondary | ICD-10-CM | POA: Diagnosis not present

## 2021-01-16 DIAGNOSIS — L738 Other specified follicular disorders: Secondary | ICD-10-CM | POA: Diagnosis not present

## 2021-01-16 DIAGNOSIS — Q828 Other specified congenital malformations of skin: Secondary | ICD-10-CM | POA: Diagnosis not present

## 2021-01-16 DIAGNOSIS — Z872 Personal history of diseases of the skin and subcutaneous tissue: Secondary | ICD-10-CM | POA: Diagnosis not present

## 2021-01-16 DIAGNOSIS — L821 Other seborrheic keratosis: Secondary | ICD-10-CM | POA: Diagnosis not present

## 2021-01-16 DIAGNOSIS — Z85828 Personal history of other malignant neoplasm of skin: Secondary | ICD-10-CM | POA: Diagnosis not present

## 2021-01-16 DIAGNOSIS — L918 Other hypertrophic disorders of the skin: Secondary | ICD-10-CM | POA: Diagnosis not present

## 2021-01-16 DIAGNOSIS — L578 Other skin changes due to chronic exposure to nonionizing radiation: Secondary | ICD-10-CM | POA: Diagnosis not present

## 2021-01-30 ENCOUNTER — Other Ambulatory Visit: Payer: Self-pay

## 2021-01-30 ENCOUNTER — Ambulatory Visit
Admission: RE | Admit: 2021-01-30 | Discharge: 2021-01-30 | Disposition: A | Discharge status: Home / Self Care | Payer: Medicare Other | Source: Ambulatory Visit | Attending: Internal Medicine | Admitting: Internal Medicine

## 2021-01-30 DIAGNOSIS — Z1231 Encounter for screening mammogram for malignant neoplasm of breast: Secondary | ICD-10-CM | POA: Diagnosis not present

## 2021-02-03 ENCOUNTER — Ambulatory Visit: Payer: Self-pay

## 2021-02-03 NOTE — Telephone Encounter (Signed)
Pt c/o 6 day h/o of mild intermittent abdominal pain. Pt stated that pain is mild and does not refer to back or chest. Pt stated that she had this issue before. Pt dx with diverticulitis in 4/22 and was referred to GI. Pt did not go to GI specialist. Pt stated she did not want to have a colonoscopy Pt stated her stools are green and mucoid, denies diarrhea. Pt stated that she has a 100.1 fever. No vomiting or abdominal bloating.  Called [facility] and spoke to Dr Gaspar Cola nurse [facility]). Gave a brief synopsis of pt complaint. Ok that the appt on Wednesday. No openings seen before then.  Care advice given to pt and pt verbalized understanding.       Reason for Disposition  [1] MILD pain (e.g., does not interfere with normal activities) AND [2] pain comes and goes (cramps) AND [3] present > 48 hours  (Exception: this same abdominal pain is a chronic symptom recurrent or ongoing AND present > 4 weeks)  Answer Assessment - Initial Assessment Questions 1. LOCATION: "Where does it hurt?"      [place] 2. RADIATION: "Does the pain shoot anywhere else?" (e.g., chest, back)     no 3. ONSET: "When did the pain begin?" (e.g., minutes, hours or days ago)      6 days ago 4. SUDDEN: "Gradual or sudden onset?"     sudden 5. PATTERN "Does the pain come and go, or is it constant?"    - If constant: "Is it getting better, staying the same, or worsening?"      (Note: Constant means the pain never goes away completely; most serious pain is constant and it progresses)     - If intermittent: "How long does it last?" "Do you have pain now?"     (Note: Intermittent means the pain goes away completely between bouts)     intermittent 6. SEVERITY: "How bad is the pain?"  (e.g., Scale 1-10; mild, moderate, or severe)   - MILD (1-3): doesn't interfere with normal activities, abdomen soft and not tender to touch    - MODERATE (4-7): interferes with normal activities or awakens from sleep, abdomen tender to touch    -  SEVERE (8-10): excruciating pain, doubled over, unable to do any normal activities    mild 7. RECURRENT SYMPTOM: "Have you ever had this type of stomach pain before?" If Yes, ask: "When was the last time?" and "What happened that time?"      yes 8. CAUSE: "What do you think is causing the stomach pain?"     *No Answer* 9. RELIEVING/AGGRAVATING FACTORS: "What makes it better or worse?" (e.g., movement, antacids, bowel movement)     Nthing, beding over 10. OTHER SYMPTOMS: "Do you have any other symptoms?" (e.g., back pain, diarrhea, fever, urination pain, vomiting)       Stools and mucus color of stool dark green in color, 100.1 at the of call 11. PREGNANCY: "Is there any chance you are pregnant?" "When was your last menstrual period?"       *No Answer*  Protocols used: Abdominal Pain - [facility]

## 2021-02-05 ENCOUNTER — Ambulatory Visit (INDEPENDENT_AMBULATORY_CARE_PROVIDER_SITE_OTHER): Payer: Medicare Other | Admitting: Internal Medicine

## 2021-02-05 ENCOUNTER — Encounter: Payer: Self-pay | Admitting: Internal Medicine

## 2021-02-05 ENCOUNTER — Other Ambulatory Visit: Payer: Self-pay

## 2021-02-05 VITALS — BP 108/58 | HR 85 | Temp 98.2°F | Ht 63.0 in | Wt 167.5 lb

## 2021-02-05 DIAGNOSIS — K5732 Diverticulitis of large intestine without perforation or abscess without bleeding: Secondary | ICD-10-CM | POA: Diagnosis not present

## 2021-02-05 MED ORDER — AMOXICILLIN-POT CLAVULANATE 875-125 MG PO TABS: 1.0000 | ORAL_TABLET | Freq: Two times a day (BID) | ORAL | 0 refills | Status: AC

## 2021-02-05 NOTE — Progress Notes (Signed)
Date:  02/05/2021   Name:  Connie Mitchell   DOB:  Jun 05, 1934   MRN:  [id number]   Chief Complaint: Abdominal Pain (X 3 days, comes and goes has felt well for 6 days with had fever of 100 with oral therm. , 3 days of lose stool, took covid test yesterday was negative,)  Abdominal Pain This is a new problem. The current episode started in the past 7 days. The problem occurs daily. The problem has been waxing and waning. The pain is located in the LLQ. The pain is mild. The quality of the pain is colicky and cramping. The abdominal pain radiates to the LLQ. Associated symptoms include diarrhea and a fever. Pertinent negatives include no vomiting. She has tried acetaminophen for the symptoms.   Lab Results  Component Value Date   CREATININE 0.73 12/17/2020   BUN 19 12/17/2020   NA 141 12/17/2020   K 4.0 12/17/2020   CL 102 12/17/2020   CO2 24 12/17/2020   Lab Results  Component Value Date   CHOL 195 12/17/2020   HDL 60 12/17/2020   LDLCALC 120 (H) 12/17/2020   TRIG 80 12/17/2020   CHOLHDL 3.3 12/17/2020   Lab Results  Component Value Date   TSH 4.380 12/17/2020   No results found for: HGBA1C Lab Results  Component Value Date   WBC 5.8 12/17/2020   HGB 15.1 12/17/2020   HCT 45.0 12/17/2020   MCV 94 12/17/2020   PLT Comment 12/17/2020   Lab Results  Component Value Date   ALT 17 12/17/2020   AST 20 12/17/2020   ALKPHOS 64 12/17/2020   BILITOT 0.7 12/17/2020     Review of Systems  Constitutional:  Positive for fever.  Respiratory:  Negative for cough, chest tightness and wheezing.   Cardiovascular:  Negative for chest pain.  Gastrointestinal:  Positive for abdominal pain and diarrhea. Negative for blood in stool and vomiting.  Neurological:  Negative for dizziness and light-headedness.  Psychiatric/Behavioral:  Negative for dysphoric mood and sleep disturbance. The patient is not nervous/anxious.    Patient Active Problem List   Diagnosis Date Noted   Enteritis  07/14/2020   Rectal bleed 07/14/2020   Unspecified atrial fibrillation ([place]) 07/14/2020   Aortic atherosclerosis ([place]) 12/12/2019   Axillary adenopathy 01/01/2019   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[place]) 12/30/2018   Osteopenia of neck of right femur 07/12/2018   Malignant neoplasm of upper-outer quadrant of left female breast ([place]) 07/07/2018   Thoracic ascending aortic aneurysm ([place]) 05/27/2018   LPRD (laryngopharyngeal reflux disease) 03/17/2018   Chronic foot pain 11/25/2017   Lumbar degenerative disc disease 09/24/2017   Hx of Lyme disease 09/24/2017   Hx of basal cell carcinoma 09/24/2017   Nuclear sclerotic cataract of left eye 08/09/2015   Osteoporosis 07/04/2014   Primary localized osteoarthrosis of shoulder region 12/13/2012   Osteoarthritis 12/18/2011    Allergies  Allergen Reactions   Ciprofloxacin Nausea And Vomiting    Past Surgical History:  Procedure Laterality Date   BREAST BIOPSY Right 02/22/2020   stereo bx, ribbon clip, path pending    BREAST LUMPECTOMY Left 1991   breast ca, lumpectomy   cataracts Bilateral    CYSTOCELE REPAIR  2010   EYE SURGERY Bilateral 2017   cataracts extracted   JOINT REPLACEMENT     MASTECTOMY Left 07/25/2018   Invasive Mammary Carcinoma   MASTECTOMY W/ SENTINEL NODE BIOPSY Left 07/25/2018   Procedure: MASTECTOMY WITH SENTINEL LYMPH NODE BIOPSY;  Surgeon: Olean Ree, MD;  Location: [facility] ORS;  Service: General;  Laterality: Left;   OOPHORECTOMY  1995   ovarian mass - benign   REPLACEMENT TOTAL KNEE Bilateral 2012,2013   SKIN BIOPSY Left    arm growth   TONSILLECTOMY  1958   TOTAL SHOULDER ARTHROPLASTY Right 2016    Social History   Tobacco Use   Smoking status: Never   Smokeless tobacco: Never   Tobacco comments:    smoking cessation materials not required  Vaping Use   Vaping Use: Never used  Substance Use Topics   Alcohol use: Yes    Comment: occasional   Drug use: Never     Medication list has been reviewed and  updated.  Current Meds  Medication Sig   Calcium Carbonate-Vitamin D (CALCIUM-VITAMIN D) 600-125 MG-UNIT TABS Take 1 tablet by mouth 2 (two) times a day.    montelukast (SINGULAIR) 10 MG tablet Take 10 mg by mouth at bedtime.   Multiple Vitamins-Minerals ([place] ADULT 50+) CAPS    omeprazole (PRILOSEC) 40 MG capsule TAKE 1 CAPSULE BY MOUTH EVERY DAY   triamcinolone (NASACORT) 55 MCG/ACT AERO nasal inhaler Place 2 sprays into the nose daily.   XARELTO 20 MG TABS tablet Take 20 mg by mouth at bedtime.   [DISCONTINUED] montelukast (SINGULAIR) 10 MG tablet TAKE 1 TABLET BY MOUTH EVERYDAY AT BEDTIME    PHQ 2/9 Scores 12/17/2020 11/27/2020 07/22/2020 07/22/2020  PHQ - 2 Score 0 0 0 0  PHQ- 9 Score 2 - 1 0    GAD 7 : Generalized Anxiety Score 12/17/2020 07/22/2020 07/22/2020 06/05/2020  Nervous, Anxious, on Edge 0 2 0 0  Control/stop worrying 0 2 0 0  Worry too much - different things 0 2 0 0  Trouble relaxing 0 0 0 0  Restless 0 0 0 0  Easily annoyed or irritable 0 2 0 0  Afraid - awful might happen 0 0 0 0  Total GAD 7 Score 0 8 0 0  Anxiety Difficulty Not difficult at all - Not difficult at all -    BP Readings from Last 3 Encounters:  02/05/21 (!) 108/58  12/17/20 116/76  11/27/20 120/72    Physical Exam Vitals and nursing note reviewed.  Constitutional:      General: She is not in acute distress.    Appearance: She is well-developed.  HENT:     Head: Normocephalic and atraumatic.  Pulmonary:     Effort: Pulmonary effort is normal. No respiratory distress.  Abdominal:     General: Bowel sounds are decreased.     Palpations: Abdomen is soft.     Tenderness: There is abdominal tenderness in the suprapubic area and left lower quadrant. There is no guarding or rebound.  Skin:    General: Skin is warm and dry.     Findings: No rash.  Neurological:     Mental Status: She is alert and oriented to person, place, and time.  Psychiatric:        Mood and Affect: Mood normal.         Behavior: Behavior normal.    Wt Readings from Last 3 Encounters:  02/05/21 167 lb 8 oz (76 kg)  12/17/20 168 lb (76.2 kg)  11/27/20 172 lb (78 kg)    BP (!) 108/58   Pulse 85   Temp 98.2 F (36.8 C) (Oral)   Ht '5\' 3"'$  (1.6 m)   Wt 167 lb 8 oz (76 kg) Comment: pt refused to get on our scale she  told me what she weighed at home  SpO2 95%   BMI 29.67 kg/m   Assessment and Plan: 1. Diverticulitis of large intestine without perforation or abscess without bleeding Continue tylenol as needed for fever [place] diet Recommend GI consult - - amoxicillin-clavulanate (AUGMENTIN) 875-125 MG tablet; Take 1 tablet by mouth 2 (two) times daily for 10 days.  Dispense: 20 tablet; Refill: 0 - Ambulatory referral to Gastroenterology   Partially dictated using [place]. Any errors are unintentional.  Halina Maidens, MD [facility]  02/05/2021

## 2021-02-07 ENCOUNTER — Other Ambulatory Visit: Payer: Self-pay

## 2021-02-07 ENCOUNTER — Encounter: Payer: Self-pay | Admitting: Surgery

## 2021-02-07 ENCOUNTER — Ambulatory Visit (INDEPENDENT_AMBULATORY_CARE_PROVIDER_SITE_OTHER): Payer: Medicare Other | Admitting: Surgery

## 2021-02-07 VITALS — BP 120/81 | HR 74 | Temp 98.2°F | Ht 63.0 in | Wt 167.8 lb

## 2021-02-07 DIAGNOSIS — K5732 Diverticulitis of large intestine without perforation or abscess without bleeding: Secondary | ICD-10-CM | POA: Diagnosis not present

## 2021-02-07 DIAGNOSIS — C50412 Malignant neoplasm of upper-outer quadrant of left female breast: Secondary | ICD-10-CM | POA: Diagnosis not present

## 2021-02-07 DIAGNOSIS — Z170 Estrogen receptor positive status [ER+]: Secondary | ICD-10-CM

## 2021-02-07 NOTE — Progress Notes (Signed)
02/07/2021  History of Present Illness: Connie Mitchell is a 87 y.o. female s/p left breast mastectomy and SLNBx on 07/25/2018 for left breast cancer.  She was last seen on 03/01/2020 for follow-up.  At that point, she had a biopsy of the right breast in the upper outer quadrant which showed fibroadenomatoid changes.  She had a recent mammogram on 01/30/2021 which did not show any suspicious findings on the right breast at this time.  The patient reports that from the breast standpoint, she is doing very well and denies any areas of pain on the right breast and also denies any issues on the left side with the mastectomy scar.  However, she notes that recently she has been feeling "lousy" because of issues with diverticulitis.  She was hospitalized in February for enteritis and possible proctitis, was in the emergency room in April for diverticulitis which was treated as an outpatient with antibiotics, and more recently she saw her PCP on 02/05/2021 because of new onset of left lower quadrant abdominal pain and was started on antibiotics.  Today, she feels better as antibiotics are kicking in more and denies any further abdominal pain.  She does note some blood in her stool which started today but denies any pain or diarrhea.  Past Medical History: Past Medical History:  Diagnosis Date   A-fib [facility])    Allergy    Anxiety    Cancer ([place]) 1991   left breast. treated with rads and lumpectomy   Cancer ([place]) 2020   newly diagnosed, also left breast   Cataract 2017   Cellulitis and abscess of toe of left foot 01/2018   GERD (gastroesophageal reflux disease)    Hx of therapeutic radiation 1991   left breast   Laryngopharyngeal reflux    sometimes food goes down wrong way and she ends up in extreme coughing/choking scenario   Personal history of radiation therapy 1991   left breast ca   Primary osteoarthritis    Thoracic aortic aneurysm (TAA) ([place])      Past Surgical History: Past Surgical History:   Procedure Laterality Date   BREAST BIOPSY Right 02/22/2020   stereo bx, ribbon clip, path pending    BREAST LUMPECTOMY Left 1991   breast ca, lumpectomy   cataracts Bilateral    CYSTOCELE REPAIR  2010   EYE SURGERY Bilateral 2017   cataracts extracted   JOINT REPLACEMENT     MASTECTOMY Left 07/25/2018   Invasive Mammary Carcinoma   MASTECTOMY W/ SENTINEL NODE BIOPSY Left 07/25/2018   Procedure: MASTECTOMY WITH SENTINEL LYMPH NODE BIOPSY;  Surgeon: Olean Ree, MD;  Location: [facility] ORS;  Service: General;  Laterality: Left;   OOPHORECTOMY  1995   ovarian mass - benign   REPLACEMENT TOTAL KNEE Bilateral 2012,2013   SKIN BIOPSY Left    arm growth   TONSILLECTOMY  1958   TOTAL SHOULDER ARTHROPLASTY Right 2016    Home Medications: Prior to Admission medications   Medication Sig Start Date End Date Taking? Authorizing Provider  amoxicillin-clavulanate (AUGMENTIN) 875-125 MG tablet Take 1 tablet by mouth 2 (two) times daily for 10 days. 02/05/21 02/15/21 Yes Glean Hess, MD  Calcium Carbonate-Vitamin D (CALCIUM-VITAMIN D) 600-125 MG-UNIT TABS Take 1 tablet by mouth 2 (two) times a day.    Yes [provider]  montelukast (SINGULAIR) 10 MG tablet Take 10 mg by mouth at bedtime.   Yes [provider]  Multiple Vitamins-Minerals ([place] 50+) CAPS    Yes [provider]  omeprazole (PRILOSEC) 40 MG capsule TAKE 1 CAPSULE BY MOUTH EVERY DAY 01/01/21  Yes Glean Hess, MD  triamcinolone (NASACORT) 55 MCG/ACT AERO nasal inhaler Place 2 sprays into the nose daily.   Yes [provider]  XARELTO 20 MG TABS tablet Take 20 mg by mouth at bedtime. 06/17/20  Yes [provider]    Allergies: Allergies  Allergen Reactions   Ciprofloxacin Nausea And Vomiting    Review of Systems: Review of Systems  Constitutional:  Negative for chills and fever.  Cardiovascular:  Negative for chest pain.  Gastrointestinal:  Positive for abdominal pain  and blood in stool. Negative for constipation, diarrhea, nausea and vomiting.  Skin:  Negative for rash.   Physical Exam BP 120/81   Pulse 74   Temp 98.2 F (36.8 C) (Oral)   Ht '5\' 3"'$  (1.6 m)   Wt 167 lb 12.8 oz (76.1 kg)   SpO2 96%   BMI 29.72 kg/m  CONSTITUTIONAL: No acute distress HEENT:  Normocephalic, atraumatic, extraocular motion intact. RESPIRATORY:  Normal respiratory effort without pathologic use of accessory muscles. CARDIOVASCULAR: Heart is regular without murmurs, gallops, or rubs. BREAST: Right breast without any palpable masses, skin changes, or nipple changes.  No right axillary lymphadenopathy.  Left chest status postmastectomy with scar well-healed.  There is a stable nodule that is at the midportion of the scar measuring about 1 cm in size which is soft, nontender.  No left axillary lymphadenopathy. GI: The abdomen is soft, nondistended, currently nontender to palpation. NEUROLOGIC:  Motor and sensation is grossly normal.  Cranial nerves are grossly intact. PSYCH:  Alert and oriented to person, place and time. Affect is normal.  Labs/Imaging: Right breast mammogram on 01/30/2021: FINDINGS: There are no findings suspicious for malignancy.   IMPRESSION: No mammographic evidence of malignancy. A result letter of this screening mammogram will be mailed directly to the patient.   RECOMMENDATION: Screening mammogram in one year. (Code:[id number]-B-01Y)   BI-RADS CATEGORY  1: Negative.   CT scan of abdomen and pelvis on 10/08/2020: IMPRESSION: 1. Prominently thickened anterior wall in a 5.8 cm segment of distal sigmoid colon, given the underlying diverticulosis this probably represents acute diverticulitis. In the absence of IV contrast it is difficult to totally exclude the possibility of a diverticular abscess in this region although no extraluminal gas is visible. Surrounding inflammatory stranding observed. 2. Ascending thoracic aortic aneurysm, visualized portion in  4.1 cm in diameter. Recommend annual imaging followup by CTA or MRA. This recommendation follows 2010 ACCF/AHA/AATS/ACR/ASA/SCA/SCAI/SIR/STS/SVM Guidelines for the Diagnosis and Management of Patients with Thoracic Aortic Disease. Circulation. 2010; [id number]. Aortic aneurysm NOS (ICD10-I71.9) 3. Other imaging findings of potential clinical significance: Aortic Atherosclerosis (ICD10-I70.0). Coronary atherosclerosis. Chronic mild dilatation of the common bile duct, etiology uncertain. Nonobstructive left nephrolithiasis. Pelvic floor laxity with small cystocele. Lumbar scoliosis, spondylosis, and degenerative disc disease causing multilevel impingement. Right parapelvic cyst, cannot exclude stable dilatation of the right kidney lower pole collecting system.  Assessment and Plan: This is a 87 y.o. female status post left mastectomy and sentinel lymph node biopsy, also with recent episode of diverticulitis.  - From the breast cancer standpoint, the patient is doing well with no evidence of recurrence, reassuring exam today, and negative mammogram of the right breast.  She will follow-up with me in 1 more year with repeat screening right breast mammogram.  - From the diverticulitis standpoint, the patient is feeling better now with no further pain in the left lower quadrant.  She has been referred to GI by Dr. Army Melia and I discussed with her that given her episodes no prior colonoscopy in 14 years, she will most likely will need a colonoscopy.  We can follow-up with her after that to discuss the findings and to see if there is any need for surgical intervention.  Face-to-face time spent with the patient and care providers was 25 minutes, with more than 50% of the time spent counseling, educating, and coordinating care of the patient.     Melvyn Neth, [facility]

## 2021-02-07 NOTE — Patient Instructions (Addendum)
You have been put on the recall list for next year. If you have not heard anything from [place] by the end of July 2023, please call our office so we can get you scheduled.   If you have any concerns or questions, please feel free to call our office.  Breast Self-Awareness Breast self-awareness is knowing how your breasts look and feel. Doing breast self-awareness is important. It allows you to catch a breast problem early while it is still small and can be treated. All women should do breast self-awareness, including women who have had breast implants. Tell your doctorif you notice a change in your breasts. What you need: A mirror. A well-lit room. How to do a breast self-exam A breast self-exam is one way to learn what is normal for your breasts and tocheck for changes. To do a breast self-exam: Look for changes  Take off all the clothes above your waist. Stand in front of a mirror in a room with good lighting. Put your hands on your hips. Push your hands down. Look at your breasts and nipples in the mirror to see if one breast or nipple looks different from the other. Check to see if: The shape of one breast is different. The size of one breast is different. There are wrinkles, dips, and bumps in one breast and not the other. Look at each breast for changes in the skin, such as: Redness. Scaly areas. Look for changes in your nipples, such as: Liquid around the nipples. Bleeding. Dimpling. Redness. A change in where the nipples are.  Feel for changes  Lie on your back on the floor. Feel each breast. To do this, follow these steps: Pick a breast to feel. Put the arm closest to that breast above your head. Use your other arm to feel the nipple area of your breast. Feel the area with the pads of your three middle fingers by making small circles with your fingers. For the first circle, press lightly. For the second circle, press harder. For the third circle, press even harder. Keep  making circles with your fingers at the different pressures as you move down your breast. Stop when you feel your ribs. Move your fingers a little toward the center of your body. Start making circles with your fingers again, this time going up until you reach your collarbone. Keep making up-and-down circles until you reach your armpit. Remember to keep using the three pressures. Feel the other breast in the same way. Sit or stand in the tub or shower. With soapy water on your skin, feel each breast the same way you did in step 2 when you were lying on the floor.  Write down what you find Writing down what you find can help you remember what to tell your doctor. Write down: What is normal for each breast. Any changes you find in each breast, including: The kind of changes you find. Whether you have pain. Size and location of any lumps. When you last had your menstrual period. General tips Check your breasts every month. If you are breastfeeding, the best time to check your breasts is after you feed your baby or after you use a breast pump. If you get menstrual periods, the best time to check your breasts is 5-7 days after your menstrual period is over. With time, you will become comfortable with the self-exam, and you will begin to know if there are changes in your breasts. Contact a doctor if you: See a  change in the shape or size of your breasts or nipples. See a change in the skin of your breast or nipples, such as red or scaly skin. Have fluid coming from your nipples that is not normal. Find a lump or thick area that was not there before. Have pain in your breasts. Have any concerns about your breast health. Summary Breast self-awareness includes looking for changes in your breasts, as well as feeling for changes within your breasts. Breast self-awareness should be done in front of a mirror in a well-lit room. You should check your breasts every month. If you get menstrual periods,  the best time to check your breasts is 5-7 days after your menstrual period is over. Let your doctor know of any changes you see in your breasts, including changes in size, changes on the skin, pain or tenderness, or fluid from your nipples that is not normal. This information is not intended to replace advice given to you by your health care provider. Make sure you discuss any questions you have with your healthcare provider. Colonoscopy, Adult A colonoscopy is a procedure to look at the entire large intestine. This procedure is done using a long, thin, flexible tube that has a camera on theend. You may have a colonoscopy: As a part of normal colorectal screening. If you have certain symptoms, such as: A low number of red blood cells in your blood (anemia). Diarrhea that does not go away. Pain in your abdomen. Blood in your stool. A colonoscopy can help screen for and diagnose medical problems, including: Tumors. Extra tissue that grows where mucus forms (polyps). Inflammation. Areas of bleeding. Tell your health care provider about: Any allergies you have. All medicines you are taking, including vitamins, herbs, eye drops, creams, and over-the-counter medicines. Any problems you or family members have had with anesthetic medicines. Any blood disorders you have. Any surgeries you have had. Any medical conditions you have. Any problems you have had with having bowel movements. Whether you are pregnant or may be pregnant. What are the risks? Generally, this is a safe procedure. However, problems may occur, including: Bleeding. Damage to your intestine. Allergic reactions to medicines given during the procedure. Infection. This is rare. What happens before the procedure? Eating and drinking restrictions Follow instructions from your health care provider about eating or drinking restrictions, which may include: A few days before the procedure: Follow a low-fiber diet. Avoid nuts,  seeds, dried fruit, raw fruits, and vegetables. 1-3 days before the procedure: Eat only gelatin dessert or ice pops. Drink only clear liquids, such as water, clear juice, clear broth or bouillon, black coffee or tea, or clear soft drinks or sports drinks. Avoid liquids that contain red or purple dye. The day of the procedure: Do not eat solid foods. You may continue to drink clear liquids until up to 2 hours before the procedure. Do not eat or drink anything starting 2 hours before the procedure, or within the time period that your health care provider recommends. Bowel prep If you were prescribed a bowel prep to take by mouth (orally) to clean out your colon: Take it as told by your health care provider. Starting the day before your procedure, you will need to drink a large amount of liquid medicine. The liquid will cause you to have many bowel movements of loose stool until your stool becomes almost clear or light green. If your skin or the opening between the buttocks (anus) gets irritated from diarrhea, you may relieve  the irritation using: Wipes with medicine in them, such as adult wet wipes with aloe and vitamin E. A product to soothe skin, such as petroleum jelly. If you vomit while drinking the bowel prep: Take a break for up to 60 minutes. Begin the bowel prep again. Call your health care provider if you keep vomiting or you cannot take the bowel prep without vomiting. To clean out your colon, you may also be given: Laxative medicines. These help you have a bowel movement. Instructions for enema use. An enema is liquid medicine injected into your rectum. Medicines Ask your health care provider about: Changing or stopping your regular medicines or supplements. This is especially important if you are taking iron supplements, diabetes medicines, or blood thinners. Taking medicines such as aspirin and ibuprofen. These medicines can thin your blood. Do not take these medicines unless your  health care provider tells you to take them. Taking over-the-counter medicines, vitamins, herbs, and supplements. General instructions Ask your health care provider what steps will be taken to help prevent infection. These may include washing skin with a germ-killing soap. Plan to have someone take you home from the hospital or clinic. What happens during the procedure?  An IV will be inserted into one of your veins. You may be given one or more of the following: A medicine to help you relax (sedative). A medicine to numb the area (local anesthetic). A medicine to make you fall asleep (general anesthetic). This is rarely needed. You will lie on your side with your knees bent. The tube will: Have oil or gel put on it (be lubricated). Be inserted into your anus. Be gently eased through all parts of your large intestine. Air will be sent into your colon to keep it open. This may cause some pressure or cramping. Images will be taken with the camera and will appear on a screen. A small tissue sample may be removed to be looked at under a microscope (biopsy). The tissue may be sent to a lab for testing if any signs of problems are found. If small polyps are found, they may be removed and checked for cancer cells. When the procedure is finished, the tube will be removed. The procedure may vary among health care providers and hospitals. What happens after the procedure? Your blood pressure, heart rate, breathing rate, and blood oxygen level will be monitored until you leave the hospital or clinic. You may have a small amount of blood in your stool. You may pass gas and have mild cramping or bloating in your abdomen. This is caused by the air that was used to open your colon during the exam. Do not drive for 24 hours after the procedure. It is up to you to get the results of your procedure. Ask your health care provider, or the department that is doing the procedure, when your results will be  ready. Summary A colonoscopy is a procedure to look at the entire large intestine. Follow instructions from your health care provider about eating and drinking before the procedure. If you were prescribed an oral bowel prep to clean out your colon, take it as told by your health care provider. During the colonoscopy, a flexible tube with a camera on its end is inserted into the anus and then passed into the other parts of the large intestine. This information is not intended to replace advice given to you by your health care provider. Make sure you discuss any questions you have with your healthcare  provider. Document Revised: 12/23/2018 Document Reviewed: 12/23/2018 Elsevier Patient Education  [place].

## 2021-02-10 ENCOUNTER — Telehealth: Payer: Self-pay | Admitting: Internal Medicine

## 2021-02-10 ENCOUNTER — Other Ambulatory Visit: Payer: Self-pay | Admitting: Internal Medicine

## 2021-02-10 DIAGNOSIS — B370 Candidal stomatitis: Secondary | ICD-10-CM

## 2021-02-10 MED ORDER — NYSTATIN 100000 UNIT/ML MT SUSP: 5.0000 mL | Freq: Four times a day (QID) | OROMUCOSAL | 0 refills | Status: DC

## 2021-02-10 NOTE — Telephone Encounter (Signed)
Informed pt that Nystatin suspension was sent to [facility] in [place]. Pt verbalized understanding.  KP

## 2021-02-10 NOTE — Telephone Encounter (Signed)
Called patient to review which medication is causing "thrush" .Patient requesting Nystatin suspension medication due to c/o "thrush" in mouth from taking antibiotic amoxicillin. Patient reports "thrush" on palate and noted itching and discomfort swallowing. Denies difficulty swallowing but has noted changes. Nystatin not on medication list . Patient reports she has been ordered this medication for similar issues in the past. Do you want to order nystatin?

## 2021-02-10 NOTE — Telephone Encounter (Signed)
Please review.  KP

## 2021-02-10 NOTE — Telephone Encounter (Signed)
Medication Refill - Medication: medication she is taking gives her thrush   nystatin (MYCOSTATIN) 100000 UNIT/ML suspension  Has the patient contacted their pharmacy? No. (Agent: If no, request that the patient contact the pharmacy for the refill.) (Agent: If yes, when and what did the pharmacy advise?)  Preferred Pharmacy (with phone number or street name): [id number] [place] [id number] Phone:  [phone] Fax:  [phone]  Agent: Please be advised that RX refills may take up to 3 business days. We ask that you follow-up with your pharmacy.

## 2021-03-04 ENCOUNTER — Other Ambulatory Visit: Payer: Self-pay | Admitting: Internal Medicine

## 2021-03-04 ENCOUNTER — Encounter: Payer: Self-pay | Admitting: Internal Medicine

## 2021-04-09 ENCOUNTER — Ambulatory Visit (INDEPENDENT_AMBULATORY_CARE_PROVIDER_SITE_OTHER): Payer: Medicare Other | Admitting: Gastroenterology

## 2021-04-09 ENCOUNTER — Other Ambulatory Visit: Payer: Self-pay

## 2021-04-09 ENCOUNTER — Other Ambulatory Visit: Payer: Self-pay | Admitting: Gastroenterology

## 2021-04-09 ENCOUNTER — Encounter: Payer: Self-pay | Admitting: Gastroenterology

## 2021-04-09 VITALS — BP 133/82 | HR 65 | Temp 96.4°F | Ht 63.0 in | Wt 167.0 lb

## 2021-04-09 DIAGNOSIS — K625 Hemorrhage of anus and rectum: Secondary | ICD-10-CM | POA: Diagnosis not present

## 2021-04-09 DIAGNOSIS — R935 Abnormal findings on diagnostic imaging of other abdominal regions, including retroperitoneum: Secondary | ICD-10-CM

## 2021-04-09 MED ORDER — SUTAB 1479-225-188 MG PO TABS: 1.0000 | ORAL_TABLET | ORAL | 0 refills | Status: DC

## 2021-04-09 NOTE — H&P (View-Only) (Signed)
Gastroenterology Consultation  Referring Provider:     Glean Hess, MD Primary Care Physician:  Glean Hess, MD Primary Gastroenterologist:  Dr. Allen Norris     Reason for Consultation:     Diverticulitis        HPI:   Connie Mitchell is a 87 y.o. y/o female referred for consultation & management of Diverticulitist by Dr. Army Melia, Jesse Sans, MD.  This patient comes in today with a history of diverticulitis.  The patient had a CT scan back in April that showed:  CT scan of abdomen and pelvis on 10/08/2020: IMPRESSION: 1. Prominently thickened anterior wall in a 5.8 cm segment of distal sigmoid colon, given the underlying diverticulosis this probably represents acute diverticulitis. In the absence of IV contrast it is difficult to totally exclude the possibility of a diverticular abscess in this region although no extraluminal gas is visible. Surrounding inflammatory stranding observed. 2. Ascending thoracic aortic aneurysm, visualized portion in 4.1 cm in diameter. Recommend annual imaging followup by CTA or MRA. This recommendation follows 2010 ACCF/AHA/AATS/ACR/ASA/SCA/SCAI/SIR/STS/SVM Guidelines for the Diagnosis and Management of Patients with Thoracic Aortic Disease. Circulation. 2010; 121: P102-H852. Aortic aneurysm NOS (ICD10-I71.9) 3. Other imaging findings of potential clinical significance: Aortic Atherosclerosis (ICD10-I70.0). Coronary atherosclerosis. Chronic mild dilatation of the common bile duct, etiology uncertain. Nonobstructive left nephrolithiasis. Pelvic floor laxity with small cystocele. Lumbar scoliosis, spondylosis, and degenerative disc disease causing multilevel impingement. Right parapelvic cyst, cannot exclude stable dilatation of the right kidney lower pole collecting system.  The patient was seen by her primary care provider with abdominal pain and was started on antibiotics in August.  A few days later the patient was then seen by surgery who reported that  the patient was feeling better but recommended the patient have a colonoscopy since the patient had not had a colonoscopy in 14 years. She reports that she had a diagnosis of UC at 42 but has not had any symptoms since.  Past Medical History:  Diagnosis Date   A-fib [facility])    Allergy    Anxiety    Cancer ([place]) 1991   left breast. treated with rads and lumpectomy   Cancer ([place]) 2020   newly diagnosed, also left breast   Cataract 2017   Cellulitis and abscess of toe of left foot 01/2018   GERD (gastroesophageal reflux disease)    Hx of therapeutic radiation 1991   left breast   Laryngopharyngeal reflux    sometimes food goes down wrong way and she ends up in extreme coughing/choking scenario   Personal history of radiation therapy 1991   left breast ca   Primary osteoarthritis    Thoracic aortic aneurysm (TAA) ([place])     Past Surgical History:  Procedure Laterality Date   BREAST BIOPSY Right 02/22/2020   stereo bx, ribbon clip, path pending    BREAST LUMPECTOMY Left 1991   breast ca, lumpectomy   cataracts Bilateral    CYSTOCELE REPAIR  2010   EYE SURGERY Bilateral 2017   cataracts extracted   JOINT REPLACEMENT     MASTECTOMY Left 07/25/2018   Invasive Mammary Carcinoma   MASTECTOMY W/ SENTINEL NODE BIOPSY Left 07/25/2018   Procedure: MASTECTOMY WITH SENTINEL LYMPH NODE BIOPSY;  Surgeon: Olean Ree, MD;  Location: [facility] ORS;  Service: General;  Laterality: Left;   OOPHORECTOMY  1995   ovarian mass - benign   REPLACEMENT TOTAL KNEE Bilateral 2012,2013   SKIN BIOPSY Left    arm growth   TONSILLECTOMY  1958   TOTAL SHOULDER ARTHROPLASTY Right 2016    Prior to Admission medications   Medication Sig Start Date End Date Taking? Authorizing Provider  Calcium Carbonate-Vitamin D (CALCIUM-VITAMIN D) 600-125 MG-UNIT TABS Take 1 tablet by mouth 2 (two) times a day.     [provider]  Multiple Vitamins-Minerals ([place] 50+) CAPS     [provider]   nystatin (MYCOSTATIN) 100000 UNIT/ML suspension Take 5 mLs (500,000 Units total) by mouth 4 (four) times daily. Swish, gargle and spit 02/10/21   Glean Hess, MD  omeprazole (PRILOSEC) 40 MG capsule TAKE 1 CAPSULE BY MOUTH EVERY DAY 01/01/21   Glean Hess, MD  triamcinolone (NASACORT) 55 MCG/ACT AERO nasal inhaler Place 2 sprays into the nose daily.    [provider]  XARELTO 20 MG TABS tablet Take 20 mg by mouth at bedtime. 06/17/20   [provider]    Family History  Problem Relation Age of Onset   Hypertension Mother    Alzheimer's disease Mother    Hearing loss Mother    Stroke Father    Cancer Brother        prostate, rectal cancer   Atrial fibrillation Brother    Atrial fibrillation Brother    Stroke Maternal Uncle    Breast cancer Cousin 72       paternal x 2   Cancer Other        breast     Social History   Tobacco Use   Smoking status: Never   Smokeless tobacco: Never   Tobacco comments:    smoking cessation materials not required  Vaping Use   Vaping Use: Never used  Substance Use Topics   Alcohol use: Yes    Comment: occasional   Drug use: Never    Allergies as of 04/09/2021 - Review Complete 02/07/2021  Allergen Reaction Noted   Ciprofloxacin Nausea And Vomiting 09/25/2011    Review of Systems:    All systems reviewed and negative except where noted in HPI.   Physical Exam:  There were no vitals taken for this visit. No LMP recorded. Patient is postmenopausal. General:   Alert,  Well-developed, well-nourished, pleasant and cooperative in NAD Head:  Normocephalic and atraumatic. Eyes:  Sclera clear, no icterus.   Conjunctiva pink. Ears:  Normal auditory acuity. Neck:  Supple; no masses or thyromegaly. Lungs:  Respirations even and unlabored.  Clear throughout to auscultation.   No wheezes, crackles, or rhonchi. No acute distress. Heart:  Regular rate and rhythm; no murmurs, clicks, rubs, or gallops. Abdomen:  Normal  bowel sounds.  No bruits.  Soft, non-tender and non-distended without masses, hepatosplenomegaly or hernias noted.  No guarding or rebound tenderness.  Negative Carnett sign.   Rectal:  Deferred.  Pulses:  Normal pulses noted. Extremities:  No clubbing or edema.  No cyanosis.  Arthritis changes in the hands Neurologic:  Alert and oriented x3;  grossly normal neurologically. Skin:  Intact without significant lesions or rashes.  No jaundice. Lymph Nodes:  No significant cervical adenopathy. Psych:  Alert and cooperative. Normal mood and affect.  Imaging Studies: No results found.  Assessment and Plan:   Connie Mitchell is a 87 y.o. y/o female who comes in with a history of diverticulitis and thickening of the colon on imaging.  The patient also reports that she has had some rectal bleeding.  The patient has been told about the risks and benefits of doing a colonoscopy including the risks of not  investigating what is going on.  The patient reports although she is apprehensive she wants to be proactive and would like to be set up for a colonoscopy.  The patient will be set up for colonoscopy.  She has been explained the plan and agrees with it.    Lucilla Lame, MD. Marval Regal    Note: This dictation was prepared with Dragon dictation along with smaller phrase technology. Any transcriptional errors that result from this process are unintentional.

## 2021-04-09 NOTE — Progress Notes (Signed)
Gastroenterology Consultation  Referring Provider:     Glean Hess, MD Primary Care Physician:  Glean Hess, MD Primary Gastroenterologist:  Dr. Allen Norris     Reason for Consultation:     Diverticulitis        HPI:   Connie Mitchell is a 87 y.o. y/o female referred for consultation & management of Diverticulitist by Dr. Army Melia, Jesse Sans, MD.  This patient comes in today with a history of diverticulitis.  The patient had a CT scan back in April that showed:  CT scan of abdomen and pelvis on 10/08/2020: IMPRESSION: 1. Prominently thickened anterior wall in a 5.8 cm segment of distal sigmoid colon, given the underlying diverticulosis this probably represents acute diverticulitis. In the absence of IV contrast it is difficult to totally exclude the possibility of a diverticular abscess in this region although no extraluminal gas is visible. Surrounding inflammatory stranding observed. 2. Ascending thoracic aortic aneurysm, visualized portion in 4.1 cm in diameter. Recommend annual imaging followup by CTA or MRA. This recommendation follows 2010 ACCF/AHA/AATS/ACR/ASA/SCA/SCAI/SIR/STS/SVM Guidelines for the Diagnosis and Management of Patients with Thoracic Aortic Disease. Circulation. 2010; 121: T016-W109. Aortic aneurysm NOS (ICD10-I71.9) 3. Other imaging findings of potential clinical significance: Aortic Atherosclerosis (ICD10-I70.0). Coronary atherosclerosis. Chronic mild dilatation of the common bile duct, etiology uncertain. Nonobstructive left nephrolithiasis. Pelvic floor laxity with small cystocele. Lumbar scoliosis, spondylosis, and degenerative disc disease causing multilevel impingement. Right parapelvic cyst, cannot exclude stable dilatation of the right kidney lower pole collecting system.  The patient was seen by her primary care provider with abdominal pain and was started on antibiotics in August.  A few days later the patient was then seen by surgery who reported that  the patient was feeling better but recommended the patient have a colonoscopy since the patient had not had a colonoscopy in 14 years. She reports that she had a diagnosis of UC at 42 but has not had any symptoms since.  Past Medical History:  Diagnosis Date   A-fib [facility])    Allergy    Anxiety    Cancer ([place]) 1991   left breast. treated with rads and lumpectomy   Cancer ([place]) 2020   newly diagnosed, also left breast   Cataract 2017   Cellulitis and abscess of toe of left foot 01/2018   GERD (gastroesophageal reflux disease)    Hx of therapeutic radiation 1991   left breast   Laryngopharyngeal reflux    sometimes food goes down wrong way and she ends up in extreme coughing/choking scenario   Personal history of radiation therapy 1991   left breast ca   Primary osteoarthritis    Thoracic aortic aneurysm (TAA) ([place])     Past Surgical History:  Procedure Laterality Date   BREAST BIOPSY Right 02/22/2020   stereo bx, ribbon clip, path pending    BREAST LUMPECTOMY Left 1991   breast ca, lumpectomy   cataracts Bilateral    CYSTOCELE REPAIR  2010   EYE SURGERY Bilateral 2017   cataracts extracted   JOINT REPLACEMENT     MASTECTOMY Left 07/25/2018   Invasive Mammary Carcinoma   MASTECTOMY W/ SENTINEL NODE BIOPSY Left 07/25/2018   Procedure: MASTECTOMY WITH SENTINEL LYMPH NODE BIOPSY;  Surgeon: Olean Ree, MD;  Location: [facility] ORS;  Service: General;  Laterality: Left;   OOPHORECTOMY  1995   ovarian mass - benign   REPLACEMENT TOTAL KNEE Bilateral 2012,2013   SKIN BIOPSY Left    arm growth   TONSILLECTOMY  1958   TOTAL SHOULDER ARTHROPLASTY Right 2016    Prior to Admission medications   Medication Sig Start Date End Date Taking? Authorizing Provider  Calcium Carbonate-Vitamin D (CALCIUM-VITAMIN D) 600-125 MG-UNIT TABS Take 1 tablet by mouth 2 (two) times a day.     [provider]  Multiple Vitamins-Minerals ([place] 50+) CAPS     [provider]   nystatin (MYCOSTATIN) 100000 UNIT/ML suspension Take 5 mLs (500,000 Units total) by mouth 4 (four) times daily. Swish, gargle and spit 02/10/21   Glean Hess, MD  omeprazole (PRILOSEC) 40 MG capsule TAKE 1 CAPSULE BY MOUTH EVERY DAY 01/01/21   Glean Hess, MD  triamcinolone (NASACORT) 55 MCG/ACT AERO nasal inhaler Place 2 sprays into the nose daily.    [provider]  XARELTO 20 MG TABS tablet Take 20 mg by mouth at bedtime. 06/17/20   [provider]    Family History  Problem Relation Age of Onset   Hypertension Mother    Alzheimer's disease Mother    Hearing loss Mother    Stroke Father    Cancer Brother        prostate, rectal cancer   Atrial fibrillation Brother    Atrial fibrillation Brother    Stroke Maternal Uncle    Breast cancer Cousin 72       paternal x 2   Cancer Other        breast     Social History   Tobacco Use   Smoking status: Never   Smokeless tobacco: Never   Tobacco comments:    smoking cessation materials not required  Vaping Use   Vaping Use: Never used  Substance Use Topics   Alcohol use: Yes    Comment: occasional   Drug use: Never    Allergies as of 04/09/2021 - Review Complete 02/07/2021  Allergen Reaction Noted   Ciprofloxacin Nausea And Vomiting 09/25/2011    Review of Systems:    All systems reviewed and negative except where noted in HPI.   Physical Exam:  There were no vitals taken for this visit. No LMP recorded. Patient is postmenopausal. General:   Alert,  Well-developed, well-nourished, pleasant and cooperative in NAD Head:  Normocephalic and atraumatic. Eyes:  Sclera clear, no icterus.   Conjunctiva pink. Ears:  Normal auditory acuity. Neck:  Supple; no masses or thyromegaly. Lungs:  Respirations even and unlabored.  Clear throughout to auscultation.   No wheezes, crackles, or rhonchi. No acute distress. Heart:  Regular rate and rhythm; no murmurs, clicks, rubs, or gallops. Abdomen:  Normal  bowel sounds.  No bruits.  Soft, non-tender and non-distended without masses, hepatosplenomegaly or hernias noted.  No guarding or rebound tenderness.  Negative Carnett sign.   Rectal:  Deferred.  Pulses:  Normal pulses noted. Extremities:  No clubbing or edema.  No cyanosis.  Arthritis changes in the hands Neurologic:  Alert and oriented x3;  grossly normal neurologically. Skin:  Intact without significant lesions or rashes.  No jaundice. Lymph Nodes:  No significant cervical adenopathy. Psych:  Alert and cooperative. Normal mood and affect.  Imaging Studies: No results found.  Assessment and Plan:   Connie Mitchell is a 87 y.o. y/o female who comes in with a history of diverticulitis and thickening of the colon on imaging.  The patient also reports that she has had some rectal bleeding.  The patient has been told about the risks and benefits of doing a colonoscopy including the risks of not  investigating what is going on.  The patient reports although she is apprehensive she wants to be proactive and would like to be set up for a colonoscopy.  The patient will be set up for colonoscopy.  She has been explained the plan and agrees with it.    Lucilla Lame, MD. Marval Regal    Note: This dictation was prepared with Dragon dictation along with smaller phrase technology. Any transcriptional errors that result from this process are unintentional.

## 2021-04-10 ENCOUNTER — Other Ambulatory Visit: Payer: Self-pay

## 2021-04-10 DIAGNOSIS — K625 Hemorrhage of anus and rectum: Secondary | ICD-10-CM

## 2021-04-10 DIAGNOSIS — R935 Abnormal findings on diagnostic imaging of other abdominal regions, including retroperitoneum: Secondary | ICD-10-CM

## 2021-04-15 ENCOUNTER — Encounter: Payer: Self-pay | Admitting: Gastroenterology

## 2021-04-19 ENCOUNTER — Ambulatory Visit
Admission: EM | Admit: 2021-04-19 | Discharge: 2021-04-19 | Disposition: A | Discharge status: Home / Self Care | Payer: Medicare Other | Attending: Emergency Medicine | Admitting: Emergency Medicine

## 2021-04-19 ENCOUNTER — Other Ambulatory Visit: Payer: Self-pay

## 2021-04-19 ENCOUNTER — Encounter: Payer: Self-pay | Admitting: Emergency Medicine

## 2021-04-19 DIAGNOSIS — J309 Allergic rhinitis, unspecified: Secondary | ICD-10-CM

## 2021-04-19 DIAGNOSIS — J029 Acute pharyngitis, unspecified: Secondary | ICD-10-CM

## 2021-04-19 DIAGNOSIS — R0982 Postnasal drip: Secondary | ICD-10-CM | POA: Diagnosis not present

## 2021-04-19 LAB — GROUP A STREP BY PCR: Group A Strep by PCR: NOT DETECTED

## 2021-04-19 NOTE — ED Triage Notes (Signed)
Patient c/o sore throat for the past 2 weeks.  Patient denies fevers.

## 2021-04-19 NOTE — Discharge Instructions (Addendum)
As we discussed, your sore throat may be coming from postnasal drip as a result of your allergies.  When you do your 2 squirts of Nasacort at night follow them with 1 squirt of nasal saline to help push the medication particles up to the turbinates between your eyes where they will take effect.  Count to 10 and that the saline dripped on your throat.  Repeat on the other side immediately afterwards.  Return for reevaluation for any new or worsening symptoms.

## 2021-04-19 NOTE — ED Provider Notes (Signed)
[facility]    CSN: [id number] Arrival date & time: 04/19/21  6433      History   Chief Complaint Chief Complaint  Patient presents with   Sore Throat    HPI Connie Mitchell is a 87 y.o. female.   HPI  87 year old female here for evaluation of sore throat.  Patient reports that she has been experiencing a sore throat on the left side of her throat for the last 2-1/2 weeks.  She admits to intermittent runny nose with clear nasal discharge and clear postnasal drip.  She has allergies and she only uses Nasacort.  The Nasacort does not appear to be helping her symptoms.  She has tried other allergy medication in the past and she says she does not like them.  She does have a history of a chronic cough which is occasionally productive for sputum in the morning but that has not increased.  She states in the evenings she has pain and pressure across her forehead and behind her eyes.  She denies any sick contacts.  She is also complaining of ear itching.  She denies fever, shortness breath or wheezing, or GI complaints.  Past Medical History:  Diagnosis Date   A-fib [facility])    Allergy    Anxiety    Cancer ([place]) 1991   left breast. treated with rads and lumpectomy   Cancer ([place]) 2020   newly diagnosed, also left breast   Cataract 2017   Cellulitis and abscess of toe of left foot 01/2018   Dental bridge present    permanent - bottom   GERD (gastroesophageal reflux disease)    Hx of therapeutic radiation 1991   left breast   Laryngopharyngeal reflux    sometimes food goes down wrong way and she ends up in extreme coughing/choking scenario   Personal history of radiation therapy 1991   left breast ca   Primary osteoarthritis    Thoracic aortic aneurysm (TAA)     Patient Active Problem List   Diagnosis Date Noted   Enteritis 07/14/2020   Rectal bleed 07/14/2020   Unspecified atrial fibrillation ([place]) 07/14/2020   Aortic atherosclerosis ([place]) 12/12/2019   Axillary  adenopathy 01/01/2019   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   Osteopenia of neck of right femur 07/12/2018   Malignant neoplasm of upper-outer quadrant of left female breast ([place]) 07/07/2018   Thoracic ascending aortic aneurysm 05/27/2018   LPRD (laryngopharyngeal reflux disease) 03/17/2018   Chronic foot pain 11/25/2017   Lumbar degenerative disc disease 09/24/2017   Hx of Lyme disease 09/24/2017   Hx of basal cell carcinoma 09/24/2017   Nuclear sclerotic cataract of left eye 08/09/2015   Osteoporosis 07/04/2014   Primary localized osteoarthrosis of shoulder region 12/13/2012   Osteoarthritis 12/18/2011    Past Surgical History:  Procedure Laterality Date   BREAST BIOPSY Right 02/22/2020   stereo bx, ribbon clip, path pending    BREAST LUMPECTOMY Left 1991   breast ca, lumpectomy   cataracts Bilateral    CYSTOCELE REPAIR  2010   EYE SURGERY Bilateral 2017   cataracts extracted   JOINT REPLACEMENT     MASTECTOMY Left 07/25/2018   Invasive Mammary Carcinoma   MASTECTOMY W/ SENTINEL NODE BIOPSY Left 07/25/2018   Procedure: MASTECTOMY WITH SENTINEL LYMPH NODE BIOPSY;  Surgeon: Olean Ree, MD;  Location: [facility] ORS;  Service: General;  Laterality: Left;   OOPHORECTOMY  1995   ovarian mass - benign   REPLACEMENT TOTAL KNEE Bilateral 2012,2013   SKIN  BIOPSY Left    arm growth   TONSILLECTOMY  1958   TOTAL SHOULDER ARTHROPLASTY Right 2016    OB History   No obstetric history on file.      Home Medications    Prior to Admission medications   Medication Sig Start Date End Date Taking? Authorizing Provider  Calcium Carbonate-Vitamin D (CALCIUM-VITAMIN D) 600-125 MG-UNIT TABS Take 1 tablet by mouth 2 (two) times a day.    Yes [provider]  Multiple Vitamins-Minerals ([place] 50+) CAPS    Yes [provider]  omeprazole (PRILOSEC) 40 MG capsule TAKE 1 CAPSULE BY MOUTH EVERY DAY 01/01/21  Yes Glean Hess, MD  XARELTO 20 MG TABS tablet Take 20  mg by mouth at bedtime. 06/17/20  Yes [provider]  Sodium Sulfate-Mag Sulfate-KCl (SUTAB) [phone] MG TABS Take 1 kit by mouth as directed. 04/09/21   Lucilla Lame, MD  triamcinolone (NASACORT) 55 MCG/ACT AERO nasal inhaler Place 2 sprays into the nose daily.    [provider]    Family History Family History  Problem Relation Age of Onset   Hypertension Mother    Alzheimer's disease Mother    Hearing loss Mother    Stroke Father    Cancer Brother        prostate, rectal cancer   Atrial fibrillation Brother    Atrial fibrillation Brother    Stroke Maternal Uncle    Breast cancer Cousin 56       paternal x 2   Cancer Other        breast    Social History Social History   Tobacco Use   Smoking status: Never   Smokeless tobacco: Never   Tobacco comments:    smoking cessation materials not required  Vaping Use   Vaping Use: Never used  Substance Use Topics   Alcohol use: Yes    Comment: occasional   Drug use: Never     Allergies   Ciprofloxacin   Review of Systems Review of Systems  Constitutional:  Negative for activity change, appetite change and fever.  HENT:  Positive for congestion, ear pain, postnasal drip, rhinorrhea, sinus pressure and sore throat.        Ear itching  Respiratory:  Positive for cough. Negative for shortness of breath and wheezing.   Gastrointestinal:  Negative for diarrhea, nausea and vomiting.  Skin:  Negative for rash.  Hematological: Negative.   Psychiatric/Behavioral: Negative.      Physical Exam Triage Vital Signs ED Triage Vitals  Enc Vitals Group     BP 04/19/21 1045 115/72     Pulse Rate 04/19/21 1045 78     Resp 04/19/21 1045 14     Temp 04/19/21 1045 98.4 F (36.9 C)     Temp Source 04/19/21 1045 Oral     SpO2 04/19/21 1045 98 %     Weight 04/19/21 1042 166 lb (75.3 kg)     Height 04/19/21 1042 _0  (1.6 m)     Head Circumference --      Peak Flow --      Pain Score 04/19/21 1042 3      Pain Loc --      Pain Edu? --      Excl. in [place]? --    No data found.  Updated Vital Signs BP 115/72 (BP Location: Left Arm)   Pulse 78   Temp 98.4 F (36.9 C) (Oral)   Resp 14   Ht 5'  3" (1.6 m)   Wt 166 lb (75.3 kg)   SpO2 98%   BMI 29.41 kg/m   Visual Acuity Right Eye Distance:   Left Eye Distance:   Bilateral Distance:    Right Eye Near:   Left Eye Near:    Bilateral Near:     Physical Exam Vitals and nursing note reviewed.  Constitutional:      General: She is not in acute distress.    Appearance: Normal appearance. She is not ill-appearing.  HENT:     Head: Normocephalic and atraumatic.     Right Ear: Tympanic membrane, ear canal and external ear normal. There is no impacted cerumen.     Left Ear: Tympanic membrane, ear canal and external ear normal. There is no impacted cerumen.     Nose: Congestion and rhinorrhea present.     Mouth/Throat:     Mouth: Mucous membranes are moist.     Pharynx: Oropharynx is clear. No posterior oropharyngeal erythema.  Eyes:     Extraocular Movements: Extraocular movements intact.     Pupils: Pupils are equal, round, and reactive to light.  Cardiovascular:     Rate and Rhythm: Normal rate and regular rhythm.     Pulses: Normal pulses.     Heart sounds: Normal heart sounds. No murmur heard.   No gallop.  Pulmonary:     Effort: Pulmonary effort is normal.     Breath sounds: Normal breath sounds. No wheezing, rhonchi or rales.  Musculoskeletal:     Cervical back: Normal range of motion and neck supple. No tenderness.  Lymphadenopathy:     Cervical: Cervical adenopathy present.  Skin:    General: Skin is warm and dry.     Capillary Refill: Capillary refill takes less than 2 seconds.     Findings: No erythema or rash.  Neurological:     General: No focal deficit present.     Mental Status: She is alert and oriented to person, place, and time.  Psychiatric:        Mood and Affect: Mood normal.        Behavior: Behavior  normal.        Thought Content: Thought content normal.        Judgment: Judgment normal.     UC Treatments / Results  Labs (all labs ordered are listed, but only abnormal results are displayed) Labs Reviewed  GROUP A STREP BY PCR    EKG   Radiology No results found.  Procedures Procedures (including critical care time)  Medications Ordered in UC Medications - No data to display  Initial Impression / Assessment and Plan / UC Course  I have reviewed the triage vital signs and the nursing notes.  Pertinent labs & imaging results that were available during my care of the patient were reviewed by me and considered in my medical decision making (see chart for details).  Patient is a very pleasant, nontoxic-appearing 87 year old female here for evaluation of left-sided sore throat this been ongoing for the past 2-1/2 weeks and is not associated with fever.  Patient does have a history of nasal allergies and uses Nasacort only.  She does endorse occasional runny nose, clear nasal discharge, and postnasal drip.  Physical exam reveals  Tympanic membrane's bilaterally with normal light reflex and clear external auditory canals.  Nasal mucosa is pale and mildly edematous with scant clear nasal discharge in both nares.  Oropharyngeal exam reveals surgically absent tonsillar pillars.  There is no significant  posterior oropharyngeal erythema or clear postnasal drip noted on exam.  Patient does have mild right-sided anterior cervical lymphadenopathy but not left-sided.  EOM's intact.  Lung sounds are clear to auscultation all fields.  The unilateral nature of the sore throat is concerning but there is no indication of infection or arteritis.  No headache, visual changes, and no cranial nerve VI lateral palsy.  Suspect patient's symptoms are coming from allergic rhinitis and postnasal drip.  I suggested to her that when she uses her Nasacort she should follow the medication spray with a squirt of  nasal saline to help push the particles up to her turbinates where they will take better effect and see if she has improvement of her symptoms.  Strep PCR was collected at triage and is negative.   Final Clinical Impressions(s) / UC Diagnoses   Final diagnoses:  Allergic rhinitis with postnasal drip  Pharyngitis, unspecified etiology     Discharge Instructions      As we discussed, your sore throat may be coming from postnasal drip as a result of your allergies.  When you do your 2 squirts of Nasacort at night follow them with 1 squirt of nasal saline to help push the medication particles up to the turbinates between your eyes where they will take effect.  Count to 10 and that the saline dripped on your throat.  Repeat on the other side immediately afterwards.  Return for reevaluation for any new or worsening symptoms.     ED Prescriptions   None    PDMP not reviewed this encounter.   Margarette Canada, NP 04/19/21 1212

## 2021-04-23 ENCOUNTER — Other Ambulatory Visit: Payer: Self-pay

## 2021-04-23 MED ORDER — NA SULFATE-K SULFATE-MG SULF 17.5-3.13-1.6 GM/177ML PO SOLN: 1.0000 | Freq: Once | ORAL | 0 refills | Status: AC

## 2021-04-24 DIAGNOSIS — Z20822 Contact with and (suspected) exposure to covid-19: Secondary | ICD-10-CM | POA: Diagnosis not present

## 2021-04-28 ENCOUNTER — Other Ambulatory Visit: Payer: Self-pay

## 2021-04-28 ENCOUNTER — Encounter: Payer: Self-pay | Admitting: Gastroenterology

## 2021-04-28 ENCOUNTER — Ambulatory Visit: Payer: Medicare Other | Admitting: Anesthesiology

## 2021-04-28 ENCOUNTER — Encounter
Admission: RE | Disposition: A | Discharge status: Home / Self Care | Payer: Self-pay | Source: Home / Self Care | Attending: Gastroenterology

## 2021-04-28 ENCOUNTER — Ambulatory Visit
Admission: RE | Admit: 2021-04-28 | Discharge: 2021-04-28 | Disposition: A | Discharge status: Home / Self Care | Payer: Medicare Other | Attending: Gastroenterology | Admitting: Gastroenterology

## 2021-04-28 DIAGNOSIS — Z853 Personal history of malignant neoplasm of breast: Secondary | ICD-10-CM | POA: Diagnosis not present

## 2021-04-28 DIAGNOSIS — D120 Benign neoplasm of cecum: Secondary | ICD-10-CM | POA: Insufficient documentation

## 2021-04-28 DIAGNOSIS — R935 Abnormal findings on diagnostic imaging of other abdominal regions, including retroperitoneum: Secondary | ICD-10-CM

## 2021-04-28 DIAGNOSIS — F419 Anxiety disorder, unspecified: Secondary | ICD-10-CM | POA: Diagnosis not present

## 2021-04-28 DIAGNOSIS — K5730 Diverticulosis of large intestine without perforation or abscess without bleeding: Secondary | ICD-10-CM | POA: Diagnosis not present

## 2021-04-28 DIAGNOSIS — Z9012 Acquired absence of left breast and nipple: Secondary | ICD-10-CM | POA: Diagnosis not present

## 2021-04-28 DIAGNOSIS — R933 Abnormal findings on diagnostic imaging of other parts of digestive tract: Secondary | ICD-10-CM | POA: Insufficient documentation

## 2021-04-28 DIAGNOSIS — D122 Benign neoplasm of ascending colon: Secondary | ICD-10-CM | POA: Insufficient documentation

## 2021-04-28 DIAGNOSIS — K635 Polyp of colon: Secondary | ICD-10-CM

## 2021-04-28 DIAGNOSIS — Z8719 Personal history of other diseases of the digestive system: Secondary | ICD-10-CM | POA: Diagnosis not present

## 2021-04-28 DIAGNOSIS — K219 Gastro-esophageal reflux disease without esophagitis: Secondary | ICD-10-CM | POA: Diagnosis not present

## 2021-04-28 DIAGNOSIS — M1990 Unspecified osteoarthritis, unspecified site: Secondary | ICD-10-CM | POA: Diagnosis not present

## 2021-04-28 DIAGNOSIS — K648 Other hemorrhoids: Secondary | ICD-10-CM | POA: Insufficient documentation

## 2021-04-28 DIAGNOSIS — K625 Hemorrhage of anus and rectum: Secondary | ICD-10-CM

## 2021-04-28 HISTORY — PX: COLONOSCOPY WITH PROPOFOL: SHX5780

## 2021-04-28 HISTORY — PX: POLYPECTOMY: SHX5525

## 2021-04-28 HISTORY — DX: Presence of dental prosthetic device (complete) (partial): Z97.2

## 2021-04-28 SURGERY — COLONOSCOPY WITH PROPOFOL
Anesthesia: General | Site: Rectum

## 2021-04-28 MED ORDER — LACTATED RINGERS IV SOLN: INTRAVENOUS | Status: DC

## 2021-04-28 MED ORDER — OXYCODONE HCL 5 MG/5ML PO SOLN: 5.0000 mg | Freq: Once | ORAL | Status: DC | PRN

## 2021-04-28 MED ORDER — STERILE WATER FOR IRRIGATION IR SOLN
Status: DC | PRN
  Administered 2021-04-28: 1

## 2021-04-28 MED ORDER — OXYCODONE HCL 5 MG PO TABS: 5.0000 mg | ORAL_TABLET | Freq: Once | ORAL | Status: DC | PRN

## 2021-04-28 MED ORDER — LIDOCAINE HCL (CARDIAC) PF 100 MG/5ML IV SOSY
PREFILLED_SYRINGE | INTRAVENOUS | Status: DC | PRN
  Administered 2021-04-28: 30 mg via INTRAVENOUS

## 2021-04-28 MED ORDER — SODIUM CHLORIDE 0.9 % IV SOLN: INTRAVENOUS | Status: DC

## 2021-04-28 MED ORDER — PROPOFOL 10 MG/ML IV BOLUS
INTRAVENOUS | Status: DC | PRN
  Administered 2021-04-28 (×2): 20 mg via INTRAVENOUS
  Administered 2021-04-28: 50 mg via INTRAVENOUS
  Administered 2021-04-28 (×3): 20 mg via INTRAVENOUS

## 2021-04-28 SURGICAL SUPPLY — 7 items
FORCEPS BIOP RAD 4 LRG CAP 4 (CUTTING FORCEPS) ×2 IMPLANT
GOWN CVR UNV OPN BCK APRN NK (MISCELLANEOUS) ×2 IMPLANT
GOWN ISOL THUMB LOOP REG UNIV (MISCELLANEOUS) ×4
KIT PRC NS LF DISP ENDO (KITS) ×1 IMPLANT
KIT PROCEDURE OLYMPUS (KITS) ×2
MANIFOLD NEPTUNE II (INSTRUMENTS) ×2 IMPLANT
WATER STERILE IRR 250ML POUR (IV SOLUTION) ×2 IMPLANT

## 2021-04-28 NOTE — Anesthesia Procedure Notes (Signed)
Procedure Name: MAC Date/Time: 04/28/2021 9:05 AM Performed by: Georga Bora, CRNA Pre-anesthesia Checklist: Patient identified, Emergency Drugs available, Suction available, Patient being monitored and Timeout performed Patient Re-evaluated:Patient Re-evaluated prior to induction Oxygen Delivery Method: Nasal cannula Placement Confirmation: positive ETCO2 and breath sounds checked- equal and bilateral

## 2021-04-28 NOTE — Op Note (Signed)
[facility] Patient Name: Connie Mitchell Procedure Date: 04/28/2021 8:47 AM MRN: [id number] Account #: [id number] Date of Birth: 08-28-1933 Admit Type: Outpatient Age: 87 Room: [facility] OR ROOM 01 Gender: Female Note Status: Finalized Instrument Name: [id number] Procedure:             Colonoscopy Indications:           Abnormal CT of the GI tract Providers:             Lucilla Lame MD, MD Referring MD:          Halina Maidens, MD (Referring MD) Medicines:             Propofol per Anesthesia Complications:         No immediate complications. Procedure:             Pre-Anesthesia Assessment:                        - Prior to the procedure, a History and Physical was                         performed, and patient medications and allergies were                         reviewed. The patient's tolerance of previous                         anesthesia was also reviewed. The risks and benefits                         of the procedure and the sedation options and risks                         were discussed with the patient. All questions were                         answered, and informed consent was obtained. Prior                         Anticoagulants: The patient has taken no previous                         anticoagulant or antiplatelet agents. ASA Grade                         Assessment: II - A patient with mild systemic disease.                         After reviewing the risks and benefits, the patient                         was deemed in satisfactory condition to undergo the                         procedure.                        After obtaining informed consent, the colonoscope was  passed under direct vision. Throughout the procedure,                         the patient's blood pressure, pulse, and oxygen                         saturations were monitored continuously. The                         Colonoscope was introduced  through the anus and                         advanced to the the cecum, identified by appendiceal                         orifice and ileocecal valve. The colonoscopy was                         performed without difficulty. The patient tolerated                         the procedure well. The quality of the bowel                         preparation was excellent. Findings:      The perianal and digital rectal examinations were normal.      A 2 mm polyp was found in the cecum. The polyp was sessile. The polyp       was removed with a cold biopsy forceps. Resection and retrieval were       complete.      Two sessile polyps were found in the ascending colon. The polyps were 2       to 3 mm in size. These polyps were removed with a cold biopsy forceps.       Resection and retrieval were complete.      Multiple small-mouthed diverticula were found in the entire colon.      Non-bleeding internal hemorrhoids were found during retroflexion. The       hemorrhoids were Grade I (internal hemorrhoids that do not prolapse). Impression:            - One 2 mm polyp in the cecum, removed with a cold                         biopsy forceps. Resected and retrieved.                        - Two 2 to 3 mm polyps in the ascending colon, removed                         with a cold biopsy forceps. Resected and retrieved.                        - Diverticulosis in the entire examined colon.                        - Non-bleeding internal hemorrhoids. Recommendation:        - Discharge patient to home.                        -  Resume previous diet.                        - Continue present medications.                        - Await pathology results.                        - Repeat colonoscopy is not recommended due to current                         age (4 years or older) for surveillance. Procedure Code(s):     --- Professional ---                        [phone], Colonoscopy, flexible; with biopsy, single or                          multiple Diagnosis Code(s):     --- Professional ---                        R93.3, Abnormal findings on diagnostic imaging of                         other parts of digestive tract                        K63.5, Polyp of colon CPT copyright 2019 American Medical Association. All rights reserved. The codes documented in this report are preliminary and upon coder review may  be revised to meet current compliance requirements. Lucilla Lame MD, MD 04/28/2021 9:15:17 AM This report has been signed electronically. Number of Addenda: 0 Note Initiated On: 04/28/2021 8:47 AM Scope Withdrawal Time: 0 hours 7 minutes 7 seconds  Total Procedure Duration: 0 hours 11 minutes 10 seconds  Estimated Blood Loss:  Estimated blood loss: none.      [facility]

## 2021-04-28 NOTE — Anesthesia Postprocedure Evaluation (Signed)
Anesthesia Post Note  Patient: Connie Mitchell  Procedure(s) Performed: COLONOSCOPY WITH BIOPSY (Rectum) POLYPECTOMY (Rectum)     Patient location during evaluation: PACU Anesthesia Type: General Level of consciousness: awake and alert Pain management: pain level controlled Vital Signs Assessment: post-procedure vital signs reviewed and stable Respiratory status: spontaneous breathing, nonlabored ventilation, respiratory function stable and patient connected to nasal cannula oxygen Cardiovascular status: blood pressure returned to baseline and stable Postop Assessment: no apparent nausea or vomiting Anesthetic complications: no   No notable events documented.  Fidel Levy

## 2021-04-28 NOTE — Interval H&P Note (Signed)
Lucilla Lame, MD [facility] [address] [place], [place] [address] [phone] Fax : [phone]  Primary Care Physician:  Glean Hess, MD Primary Gastroenterologist:  Dr. Allen Norris  Pre-Procedure History & Physical: HPI:  Connie Mitchell is a 87 y.o. female is here for an colonoscopy.   Past Medical History:  Diagnosis Date   A-fib [facility])    Allergy    Anxiety    Cancer ([place]) 1991   left breast. treated with rads and lumpectomy   Cancer ([place]) 2020   newly diagnosed, also left breast   Cataract 2017   Cellulitis and abscess of toe of left foot 01/2018   Dental bridge present    permanent - bottom   GERD (gastroesophageal reflux disease)    Hx of therapeutic radiation 1991   left breast   Laryngopharyngeal reflux    sometimes food goes down wrong way and she ends up in extreme coughing/choking scenario   Personal history of radiation therapy 1991   left breast ca   Primary osteoarthritis    Thoracic aortic aneurysm (TAA)     Past Surgical History:  Procedure Laterality Date   BREAST BIOPSY Right 02/22/2020   stereo bx, ribbon clip, path pending    BREAST LUMPECTOMY Left 1991   breast ca, lumpectomy   cataracts Bilateral    CYSTOCELE REPAIR  2010   EYE SURGERY Bilateral 2017   cataracts extracted   JOINT REPLACEMENT     MASTECTOMY Left 07/25/2018   Invasive Mammary Carcinoma   MASTECTOMY W/ SENTINEL NODE BIOPSY Left 07/25/2018   Procedure: MASTECTOMY WITH SENTINEL LYMPH NODE BIOPSY;  Surgeon: Olean Ree, MD;  Location: [facility] ORS;  Service: General;  Laterality: Left;   OOPHORECTOMY  1995   ovarian mass - benign   REPLACEMENT TOTAL KNEE Bilateral 2012,2013   SKIN BIOPSY Left    arm growth   TONSILLECTOMY  1958   TOTAL SHOULDER ARTHROPLASTY Right 2016    Prior to Admission medications   Medication Sig Start Date End Date Taking? Authorizing Provider  Calcium Carbonate-Vitamin D (CALCIUM-VITAMIN D) 600-125 MG-UNIT TABS Take 1 tablet by mouth 2  (two) times a day.    Yes [provider]  Multiple Vitamins-Minerals ([place] 50+) CAPS    Yes [provider]  omeprazole (PRILOSEC) 40 MG capsule TAKE 1 CAPSULE BY MOUTH EVERY DAY 01/01/21  Yes Glean Hess, MD  triamcinolone (NASACORT) 55 MCG/ACT AERO nasal inhaler Place 2 sprays into the nose daily.   Yes [provider]  XARELTO 20 MG TABS tablet Take 20 mg by mouth at bedtime. 06/17/20  Yes [provider]  Sodium Sulfate-Mag Sulfate-KCl (SUTAB) [phone] MG TABS Take 1 kit by mouth as directed. 04/09/21   Lucilla Lame, MD    Allergies as of 04/10/2021 - Review Complete 04/09/2021  Allergen Reaction Noted   Ciprofloxacin Nausea And Vomiting 09/25/2011    Family History  Problem Relation Age of Onset   Hypertension Mother    Alzheimer's disease Mother    Hearing loss Mother    Stroke Father    Cancer Brother        prostate, rectal cancer   Atrial fibrillation Brother    Atrial fibrillation Brother    Stroke Maternal Uncle    Breast cancer Cousin 72       paternal x 2   Cancer Other        breast    Social History   Socioeconomic History   Marital status:  Single    Spouse name: Not on file   Number of children: 0   Years of education: Not on file   Highest education level: Master's degree (e.g., MA, MS, MEng, MEd, MSW, MBA)  Occupational History   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    Comment: retired  Tobacco Use   Smoking status: Never   Smokeless tobacco: Never   Tobacco comments:    smoking cessation materials not required  Vaping Use   Vaping Use: Never used  Substance and Sexual Activity   Alcohol use: Yes    Comment: occasional   Drug use: Never   Sexual activity: Not Currently    Birth control/protection: None  Other Topics Concern   Not on file  Social History Narrative   Pt lives with business partner. Owner of [employer]   Social Determinants of Health   Financial Resource  Strain: Low Risk    Difficulty of Paying Living Expenses: Not hard at all  Food Insecurity: No Food Insecurity   Worried About Charity fundraiser in the Last Year: Never true   Arboriculturist in the Last Year: Never true  Transportation Needs: No Transportation Needs   Lack of Transportation (Medical): No   Lack of Transportation (Non-Medical): No  Physical Activity: Sufficiently Active   Days of Exercise per Week: 7 days   Minutes of Exercise per Session: 40 min  Stress: No Stress Concern Present   Feeling of Stress : Not at all  Social Connections: Socially Isolated   Frequency of Communication with Friends and Family: More than three times a week   Frequency of Social Gatherings with Friends and Family: More than three times a week   Attends Religious Services: Never   Marine scientist or Organizations: No   Attends Music therapist: Never   Marital Status: Never married  Human resources officer Violence: Not At Risk   Fear of Current or Ex-Partner: No   Emotionally Abused: No   Physically Abused: No   Sexually Abused: No    Review of Systems: See HPI, otherwise negative ROS  Physical Exam: Ht '5\' 3"'  (1.6 m)   Wt 75.8 kg   BMI 29.58 kg/m  General:   Alert,  pleasant and cooperative in NAD Head:  Normocephalic and atraumatic. Neck:  Supple; no masses or thyromegaly. Lungs:  Clear throughout to auscultation.    Heart:  Regular rate and rhythm. Abdomen:  Soft, nontender and nondistended. Normal bowel sounds, without guarding, and without rebound.   Neurologic:  Alert and  oriented x4;  grossly normal neurologically.  Impression/Plan: Connie Mitchell is here for an colonoscopy to be performed for history of diverticulitis and rectal bleeding  Risks, benefits, limitations, and alternatives regarding  colonoscopy have been reviewed with the patient.  Questions have been answered.  All parties agreeable.   Lucilla Lame, MD  04/28/2021, 8:24 AM

## 2021-04-28 NOTE — Anesthesia Preprocedure Evaluation (Signed)
Anesthesia Evaluation  Patient identified by MRN, date of birth, ID band Patient awake    Reviewed: Allergy & Precautions, NPO status , Patient's Chart, lab work & pertinent test results  History of Anesthesia Complications Negative for: history of anesthetic complications  Airway Mallampati: II  TM Distance: >3 FB Neck ROM: full    Dental no notable dental hx.    Pulmonary neg pulmonary ROS,    Pulmonary exam normal        Cardiovascular Exercise Tolerance: Good + dysrhythmias (p . afib) Atrial Fibrillation   Thoracic aortic aneurysm  : stable   Neuro/Psych Anxiety negative neurological ROS     GI/Hepatic Neg liver ROS, GERD  Controlled,  Endo/Other  negative endocrine ROS  Renal/GU negative Renal ROS  negative genitourinary   Musculoskeletal  (+) Arthritis ,   Abdominal   Peds negative pediatric ROS (+)  Hematology br cancer 2019 : L mastectomy;   Anesthesia Other Findings cards: 07/2020: fath;  echo: 2020:  Normal LVF  Normal Wall Motion  EF=65%;  last xarelto: nov 11;  Reproductive/Obstetrics negative OB ROS                             Anesthesia Physical Anesthesia Plan  ASA: 3  Anesthesia Plan: General   Post-op Pain Management:    Induction:   PONV Risk Score and Plan: Propofol infusion, TIVA and Treatment may vary due to age or medical condition  Airway Management Planned:   Additional Equipment:   Intra-op Plan:   Post-operative Plan:   Informed Consent: I have reviewed the patients History and Physical, chart, labs and discussed the procedure including the risks, benefits and alternatives for the proposed anesthesia with the patient or authorized representative who has indicated his/her understanding and acceptance.       Plan Discussed with: CRNA  Anesthesia Plan Comments:         Anesthesia Quick Evaluation

## 2021-04-28 NOTE — Transfer of Care (Signed)
Immediate Anesthesia Transfer of Care Note  Patient: Connie Mitchell  Procedure(s) Performed: COLONOSCOPY WITH BIOPSY (Rectum) POLYPECTOMY (Rectum)  Patient Location: PACU  Anesthesia Type: General  Level of Consciousness: awake, alert  and patient cooperative  Airway and Oxygen Therapy: Patient Spontanous Breathing and Patient connected to supplemental oxygen  Post-op Assessment: Post-op Vital signs reviewed, Patient's Cardiovascular Status Stable, Respiratory Function Stable, Patent Airway and No signs of Nausea or vomiting  Post-op Vital Signs: Reviewed and stable  Complications: No notable events documented.

## 2021-04-29 ENCOUNTER — Encounter: Payer: Self-pay | Admitting: Gastroenterology

## 2021-04-29 LAB — SURGICAL PATHOLOGY

## 2021-05-16 DIAGNOSIS — U071 COVID-19: Secondary | ICD-10-CM | POA: Diagnosis not present

## 2021-05-16 DIAGNOSIS — Z23 Encounter for immunization: Secondary | ICD-10-CM | POA: Diagnosis not present

## 2021-07-03 DIAGNOSIS — I7121 Aneurysm of the ascending aorta, without rupture: Secondary | ICD-10-CM | POA: Diagnosis not present

## 2021-07-03 DIAGNOSIS — I4892 Unspecified atrial flutter: Secondary | ICD-10-CM | POA: Diagnosis not present

## 2021-07-03 DIAGNOSIS — I700 Atherosclerosis of aorta: Secondary | ICD-10-CM | POA: Diagnosis not present

## 2021-07-03 DIAGNOSIS — I4891 Unspecified atrial fibrillation: Secondary | ICD-10-CM | POA: Diagnosis not present

## 2021-07-03 DIAGNOSIS — R053 Chronic cough: Secondary | ICD-10-CM | POA: Diagnosis not present

## 2021-07-03 DIAGNOSIS — R0989 Other specified symptoms and signs involving the circulatory and respiratory systems: Secondary | ICD-10-CM | POA: Diagnosis not present

## 2021-07-21 ENCOUNTER — Other Ambulatory Visit: Payer: Self-pay

## 2021-07-21 ENCOUNTER — Ambulatory Visit (INDEPENDENT_AMBULATORY_CARE_PROVIDER_SITE_OTHER): Payer: Medicare Other | Admitting: Pulmonary Disease

## 2021-07-21 ENCOUNTER — Encounter: Payer: Self-pay | Admitting: Pulmonary Disease

## 2021-07-21 VITALS — BP 132/70 | HR 78 | Temp 98.1°F | Ht 63.0 in | Wt 169.6 lb

## 2021-07-21 DIAGNOSIS — K219 Gastro-esophageal reflux disease without esophagitis: Secondary | ICD-10-CM | POA: Diagnosis not present

## 2021-07-21 DIAGNOSIS — R053 Chronic cough: Secondary | ICD-10-CM

## 2021-07-21 DIAGNOSIS — R0982 Postnasal drip: Secondary | ICD-10-CM

## 2021-07-21 NOTE — Progress Notes (Signed)
Subjective:    Patient ID: Connie Mitchell, female    DOB: December 01, 1933, 88 y.o.   MRN: [id number] Chief Complaint  Patient presents with   pulmonary consult    Prod cough with pale yellow to green sputum and occ wheezing.     HPI The patient is an 88 year old lifelong never smoker who presents for re-evaluation of a cough.  Her primary care physician is Dr. Halina Maidens.  Recall that this patient was previously evaluated by me in November 2019.  Her last visit here was December 2019.  She was lost to follow-up after that due to issues with recurrent breast cancer on the left requiring surgery and the COVID-19 pandemic.  Her cough has now been present for approximately 7 years.  At that time her cough was believed to be multifactorial due to postnasal drip (upper airway cough syndrome) and laryngopharyngeal reflux associated with GERD.  Since that time she has been evaluated by GI and has been placed on a proton pump inhibitor.  In reviewing her prior notes it is noted that the patient's cough is multifactorial likely due to postnasal drip and LPR with GERD.  The patient however has not been able to have PFTs performed due to issues with the COVID-19 pandemic.  She is now able to proceed with these.  She is a lifelong never smoker, notes that the cough sometimes is associated with mild wheezing and expectoration of pale yellow mucus.  She always has some degree of mucus production but is usually clear.  Chronic rhinitis issues.  She notes no other symptomatology.  No recent fevers, chills or sweats.  No hemoptysis.  Review of Systems A 10 point review of systems was performed and it is as noted above otherwise negative.  Past Medical History:  Diagnosis Date   A-fib [facility])    Allergy    Anxiety    Cancer ([place]) 1991   left breast. treated with rads and lumpectomy   Cancer ([place]) 2020   newly diagnosed, also left breast   Cataract 2017   Cellulitis and abscess of toe of left foot 01/2018    Dental bridge present    permanent - bottom   GERD (gastroesophageal reflux disease)    Hx of therapeutic radiation 1991   left breast   Laryngopharyngeal reflux    sometimes food goes down wrong way and she ends up in extreme coughing/choking scenario   Personal history of radiation therapy 1991   left breast ca   Primary osteoarthritis    Thoracic aortic aneurysm (TAA)    Past Surgical History:  Procedure Laterality Date   BREAST BIOPSY Right 02/22/2020   stereo bx, ribbon clip, path pending    BREAST LUMPECTOMY Left 1991   breast ca, lumpectomy   cataracts Bilateral    COLONOSCOPY WITH PROPOFOL N/A 04/28/2021   Procedure: COLONOSCOPY WITH BIOPSY;  Surgeon: Lucilla Lame, MD;  Location: [place];  Service: Endoscopy;  Laterality: N/A;   CYSTOCELE REPAIR  2010   EYE SURGERY Bilateral 2017   cataracts extracted   JOINT REPLACEMENT     MASTECTOMY Left 07/25/2018   Invasive Mammary Carcinoma   MASTECTOMY W/ SENTINEL NODE BIOPSY Left 07/25/2018   Procedure: MASTECTOMY WITH SENTINEL LYMPH NODE BIOPSY;  Surgeon: Olean Ree, MD;  Location: [facility] ORS;  Service: General;  Laterality: Left;   OOPHORECTOMY  1995   ovarian mass - benign   POLYPECTOMY N/A 04/28/2021   Procedure: POLYPECTOMY;  Surgeon: Lucilla Lame, MD;  Location:  [place];  Service: Endoscopy;  Laterality: N/A;   REPLACEMENT TOTAL KNEE Bilateral 2012,2013   SKIN BIOPSY Left    arm growth   TONSILLECTOMY  1958   TOTAL SHOULDER ARTHROPLASTY Right 2016   Patient Active Problem List   Diagnosis Date Noted   Abnormal CT of the abdomen    Polyp of ascending colon    Enteritis 07/14/2020   Rectal bleed 07/14/2020   Unspecified atrial fibrillation ([place]) 07/14/2020   Aortic atherosclerosis ([place]) 12/12/2019   Axillary adenopathy 01/01/2019   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/30/2018   Osteopenia of neck of right femur 07/12/2018   Malignant neoplasm of upper-outer quadrant of left female breast ([place])  07/07/2018   Thoracic ascending aortic aneurysm 05/27/2018   LPRD (laryngopharyngeal reflux disease) 03/17/2018   Chronic foot pain 11/25/2017   Lumbar degenerative disc disease 09/24/2017   Hx of Lyme disease 09/24/2017   Hx of basal cell carcinoma 09/24/2017   Nuclear sclerotic cataract of left eye 08/09/2015   Osteoporosis 07/04/2014   Primary localized osteoarthrosis of shoulder region 12/13/2012   Osteoarthritis 12/18/2011   Family History  Problem Relation Age of Onset   Hypertension Mother    Alzheimer's disease Mother    Hearing loss Mother    Stroke Father    Cancer Brother        prostate, rectal cancer   Atrial fibrillation Brother    Atrial fibrillation Brother    Stroke Maternal Uncle    Breast cancer Cousin 72       paternal x 2   Cancer Other        breast   Social History   Tobacco Use   Smoking status: Never   Smokeless tobacco: Never   Tobacco comments:    smoking cessation materials not required  Substance Use Topics   Alcohol use: Yes    Comment: occasional   Allergies  Allergen Reactions   Ciprofloxacin Nausea And Vomiting   Current Meds  Medication Sig   Calcium Carbonate-Vitamin D (CALCIUM-VITAMIN D) 600-125 MG-UNIT TABS Take 1 tablet by mouth 2 (two) times a day.    Multiple Vitamins-Minerals ([place] ADULT 50+) CAPS    omeprazole (PRILOSEC) 40 MG capsule TAKE 1 CAPSULE BY MOUTH EVERY DAY   triamcinolone (NASACORT) 55 MCG/ACT AERO nasal inhaler Place 2 sprays into the nose daily.   XARELTO 20 MG TABS tablet Take 20 mg by mouth at bedtime.   Immunization History  Administered Date(s) Administered   Influenza Split 06/28/2012   Influenza, High Dose Seasonal PF 03/14/2018   Influenza-Unspecified 04/15/2020   PFIZER(Purple Top)SARS-COV-2 Vaccination 08/04/2019, 08/25/2019, 03/29/2020, 01/04/2021   Pneumococcal Conjugate-13 07/04/2014   Pneumococcal Polysaccharide-23 06/16/2015   Tdap 03/11/2012   Zoster, Live 02/25/2016        Objective:   Physical Exam BP 132/70 (BP Location: Left Arm, Cuff Size: Normal)   Pulse 78   Temp 98.1 F (36.7 C) (Temporal)   Ht 5\' 3"  (1.6 m)   Wt 169 lb 9.6 oz (76.9 kg) Comment: weight is per patient  SpO2 96%   BMI 30.04 kg/m  GENERAL: Overweight woman, ambulates with assistance of a walker.  Awake alert oriented x4.  No acute distress.  No conversational dyspnea. HEAD: Normocephalic, atraumatic.  EYES: Pupils equal, round, reactive to light.  No scleral icterus.  MOUTH: Nose/mouth/throat not examined due to COVID-19 masking requirements. NECK: Supple. No thyromegaly. Trachea midline. No JVD.  No adenopathy. PULMONARY/CHEST: Good air entry bilaterally.  No adventitious sounds.  Status post left mastectomy. CARDIOVASCULAR: S1 and S2. Regular rate and rhythm.  No rubs, murmurs or gallops heard. ABDOMEN: Obese, otherwise benign MUSCULOSKELETAL: No joint deformity, no clubbing, no edema.  NEUROLOGIC: No overt focal deficit, gait assisted, speech is fluent. SKIN: Intact,warm,dry. PSYCH: Mood and behavior normal     Assessment & Plan:     ICD-10-CM   1. Chronic cough  R05.3 Pulmonary Function Test [facility] Only   Obtain PFTs Rule out cough variant asthma    2. LPRD (laryngopharyngeal reflux disease)  K21.9    Continue PPI and anti-reflux measures    3. Post-nasal drip  R09.82    This likely adds to her cough issues Nasal hygiene Zyrtec-D as needed     Orders Placed This Encounter  Procedures   Pulmonary Function Test [facility] Only    Standing Status:   Future    Number of Occurrences:   1    Standing Expiration Date:   07/21/2022    Scheduling Instructions:     Next available.    Order Specific Question:   Full PFT: includes the following: basic spirometry, spirometry pre & post bronchodilator, diffusion capacity (DLCO), lung volumes    Answer:   Full PFT   Patient in follow-up in 3 to 4 weeks time with either me or the nurse practitioner at that time.  Renold Don, MD Advanced Bronchoscopy P[facility]    *This note was dictated using voice recognition software/Dragon.  Despite best efforts to proofread, errors can occur which can change the meaning. Any transcriptional errors that result from this process are unintentional and may not be fully corrected at the time of dictation.

## 2021-07-21 NOTE — Patient Instructions (Signed)
We are going to get some breathing test that will tell us more about your airways.  I am going to send a message to the cancer center to see if you need any specific follow-up.  Continue your follow-ups with Dr. Ubaldo Glassing and Dr. Hampton Abbot.  Try some Zyrtec (cetirizine) 1 tablet at bedtime, over-the-counter medication.  Want to see if this helps with your upper airway congestion.  We will see you in follow-up in 3 to 4 weeks time you will see me or the nurse practitioner at that time.  I will continue to follow you after that.  Just want to see how your cough is doing at that time and follow-up on your breathing test.

## 2021-07-23 DIAGNOSIS — I6523 Occlusion and stenosis of bilateral carotid arteries: Secondary | ICD-10-CM | POA: Diagnosis not present

## 2021-07-23 DIAGNOSIS — R0989 Other specified symptoms and signs involving the circulatory and respiratory systems: Secondary | ICD-10-CM | POA: Diagnosis not present

## 2021-07-28 ENCOUNTER — Telehealth: Payer: Self-pay

## 2021-07-28 NOTE — Telephone Encounter (Signed)
Called and spoke to patient in regards to upcoming covid test. Nothing further needed

## 2021-07-30 ENCOUNTER — Other Ambulatory Visit
Admission: RE | Admit: 2021-07-30 | Discharge: 2021-07-30 | Disposition: A | Discharge status: Home / Self Care | Payer: Medicare Other | Source: Ambulatory Visit | Attending: Pulmonary Disease | Admitting: Pulmonary Disease

## 2021-07-30 ENCOUNTER — Other Ambulatory Visit: Payer: Self-pay

## 2021-07-30 DIAGNOSIS — Z1152 Encounter for screening for COVID-19: Secondary | ICD-10-CM

## 2021-07-30 DIAGNOSIS — Z20822 Contact with and (suspected) exposure to covid-19: Secondary | ICD-10-CM | POA: Diagnosis not present

## 2021-07-30 DIAGNOSIS — Z01812 Encounter for preprocedural laboratory examination: Secondary | ICD-10-CM | POA: Diagnosis not present

## 2021-07-30 LAB — SARS CORONAVIRUS 2 (TAT 6-24 HRS): SARS Coronavirus 2: NEGATIVE

## 2021-07-31 ENCOUNTER — Ambulatory Visit: Payer: Medicare Other | Attending: Pulmonary Disease

## 2021-07-31 DIAGNOSIS — R053 Chronic cough: Secondary | ICD-10-CM | POA: Diagnosis not present

## 2021-07-31 MED ORDER — ALBUTEROL SULFATE (2.5 MG/3ML) 0.083% IN NEBU
2.5000 mg | INHALATION_SOLUTION | Freq: Once | RESPIRATORY_TRACT | Status: AC
  Administered 2021-07-31: 2.5 mg via RESPIRATORY_TRACT
  Filled 2021-07-31: qty 3

## 2021-08-13 ENCOUNTER — Other Ambulatory Visit: Payer: Self-pay

## 2021-08-13 ENCOUNTER — Encounter: Payer: Self-pay | Admitting: Primary Care

## 2021-08-13 ENCOUNTER — Ambulatory Visit (INDEPENDENT_AMBULATORY_CARE_PROVIDER_SITE_OTHER): Payer: Medicare Other | Admitting: Primary Care

## 2021-08-13 DIAGNOSIS — R053 Chronic cough: Secondary | ICD-10-CM | POA: Insufficient documentation

## 2021-08-13 MED ORDER — ADVAIR HFA 115-21 MCG/ACT IN AERO: 2.0000 | INHALATION_SPRAY | Freq: Two times a day (BID) | RESPIRATORY_TRACT | 1 refills | Status: DC

## 2021-08-13 NOTE — Assessment & Plan Note (Addendum)
-   Patient has had a long standing hx chronic cough, never smoker. Cough previously felt to be multifactorial d/t PND and LPR with GERD. She was evaluated by GI and placed on PPI. Pulmonary function testing in February 2023 showed no overt obstructive airway disease or restrictive lung disease, possible small airway obstruction d/t scooping on flow vol loop. Recommend trial Advair HFA 115-61mcg 2 puffs twice daily. Continue Zyrtec-D prn congestion. FU in 4 weeks with Dr. Patsey Berthold or APP.  ?

## 2021-08-13 NOTE — Patient Instructions (Addendum)
?  Pulmonary function testing was normal but there was some evidence of small airway obstruction which could be related to asthma. Starting daily inhaler for cough/wheezing. Take for 4 weeks and follow-up ? ?Recommendations: ?Continue Zyrtec D (use with caution, monitor HR if >100) ?Start Advair- take two puffs morning and evening (rinse mouth after use) ? ?Rx: ?Advair HFA 115 ? ?Follow-up: ?1 month with Dr. Patsey Berthold or APP  ?

## 2021-08-13 NOTE — Progress Notes (Signed)
? ?@Patient  ID: Connie Mitchell, female    DOB: 1933/08/12, 89 y.o.   MRN: [id number] ? ?Chief Complaint  ?Patient presents with  ? Follow-up  ? ? ?Referring provider: ?Connie Hess, MD ? ?HPI: ?88 year old female, never smoked.  Past medical history significant for aortic arthrosclerosis, A-fib, thoracic ascending aortic aneurysm, LPRD, osteoporosis, breast cancer, history of Lyme disease.  Patient of Dr. Patsey Mitchell, last seen in office on 07/21/2021 for chronic cough. ? ?Previous [facility] pulmonary encounter: ?07/21/21- Dr. Patsey Mitchell  ?The patient is an 88 year old lifelong never smoker who presents for re-evaluation of a cough.  Her primary care physician is Dr. Halina Mitchell.  Recall that this patient was previously evaluated by me in November 2019.  Her last visit here was December 2019.  She was lost to follow-up after that due to issues with recurrent breast cancer on the left requiring surgery and the COVID-19 pandemic.  Her cough has now been present for approximately 7 years.  At that time her cough was believed to be multifactorial due to postnasal drip (upper airway cough syndrome) and laryngopharyngeal reflux associated with GERD.  Since that time she has been evaluated by GI and has been placed on a proton pump inhibitor. ? ?Review of Systems ?A 10 point review of systems was performed and it is as noted above otherwise negative. ? ? ?08/13/2021- Interim hx  ?Patient presents today for 4 week follow-up. She continues to have a cough with associated wheezing. Cough is productive with pale yellow mucus. She always has some mucus production. She has been taking Zyrtec-D daily which has been more effective the regular zyrtec. PFTs showed no overt obstructive airway disease or restrictive lung disease, possible small air way obstructive d.t scooping on flow vol loop. No significant shortness of breath symptoms.  ? ? ?Pulmonary function testing:  ?07/31/21- FVC 2.05 (90%), FEV1 1.62 (96%), 79, TLC 97%, NO BD response/  small airway reversibility  ? ? ?Allergies  ?Allergen Reactions  ? Ciprofloxacin Nausea And Vomiting  ? ? ?Immunization History  ?Administered Date(s) Administered  ? Influenza Split 06/28/2012  ? Influenza, High Dose Seasonal PF 03/14/2018  ? Influenza-Unspecified 04/15/2020  ? PFIZER(Purple Top)SARS-COV-2 Vaccination 08/04/2019, 08/25/2019, 03/29/2020, 01/04/2021  ? Pneumococcal Conjugate-13 07/04/2014  ? Pneumococcal Polysaccharide-23 06/16/2015  ? Tdap 03/11/2012  ? Zoster, Live 02/25/2016  ? ? ?Past Medical History:  ?Diagnosis Date  ? A-fib ([place])   ? Allergy   ? Anxiety   ? Cancer [facility]) 1991  ? left breast. treated with rads and lumpectomy  ? Cancer [facility]) 2020  ? newly diagnosed, also left breast  ? Cataract 2017  ? Cellulitis and abscess of toe of left foot 01/2018  ? Dental bridge present   ? permanent - bottom  ? GERD (gastroesophageal reflux disease)   ? Hx of therapeutic radiation 1991  ? left breast  ? Laryngopharyngeal reflux   ? sometimes food goes down wrong way and she ends up in extreme coughing/choking scenario  ? Personal history of radiation therapy 1991  ? left breast ca  ? Primary osteoarthritis   ? Thoracic aortic aneurysm (TAA)   ? ? ?Tobacco History: ?Social History  ? ?Tobacco Use  ?Smoking Status Never  ?Smokeless Tobacco Never  ?Tobacco Comments  ? smoking cessation materials not required  ? ?Counseling given: Not Answered ?Tobacco comments: smoking cessation materials not required ? ? ?Outpatient Medications Prior to Visit  ?Medication Sig Dispense Refill  ? Calcium Carbonate-Vitamin D (CALCIUM-VITAMIN D) 600-125 MG-UNIT TABS  Take 1 tablet by mouth 2 (two) times a day.     ? Multiple Vitamins-Minerals ([place] 50+) CAPS     ? omeprazole (PRILOSEC) 40 MG capsule TAKE 1 CAPSULE BY MOUTH EVERY DAY 90 capsule 2  ? triamcinolone (NASACORT) 55 MCG/ACT AERO nasal inhaler Place 2 sprays into the nose daily.    ? XARELTO 20 MG TABS tablet Take 20 mg by mouth at bedtime.    ? ?No  facility-administered medications prior to visit.  ? ? ?Review of Systems ? ?Review of Systems  ?Constitutional: Negative.   ?HENT: Negative.    ?Respiratory:  Positive for cough and wheezing. Negative for shortness of breath.   ? ? ?Physical Exam ? ?BP 102/62 (BP Location: Left Arm, Patient Position: Sitting, Cuff Size: Normal)   Pulse 69   Temp 98.1 ?F (36.7 ?C) (Oral)   Ht 5\' 3"  (1.6 m)   Wt 169 lb 14.4 oz (77.1 kg)   SpO2 99%   BMI 30.10 kg/m?  ?Physical Exam ?Constitutional:   ?   General: She is not in acute distress. ?   Appearance: Normal appearance. She is not ill-appearing.  ?HENT:  ?   Head: Normocephalic and atraumatic.  ?   Mouth/Throat:  ?   Mouth: Mucous membranes are moist.  ?   Pharynx: Oropharynx is clear.  ?Cardiovascular:  ?   Rate and Rhythm: Normal rate and regular rhythm.  ?Pulmonary:  ?   Effort: Pulmonary effort is normal.  ?   Breath sounds: Normal breath sounds. No wheezing, rhonchi or rales.  ?Musculoskeletal:     ?   General: Normal range of motion.  ?Skin: ?   General: Skin is warm and dry.  ?Neurological:  ?   General: No focal deficit present.  ?   Mental Status: She is alert and oriented to person, place, and time. Mental status is at baseline.  ?Psychiatric:     ?   Mood and Affect: Mood normal.     ?   Behavior: Behavior normal.     ?   Thought Content: Thought content normal.     ?   Judgment: Judgment normal.  ?  ? ?Lab Results: ? ?CBC ?   ?Component Value Date/Time  ? WBC 5.8 12/17/2020 0915  ? WBC 12.9 (H) 10/08/2020 1332  ? RBC 4.81 12/17/2020 0915  ? RBC 4.02 10/08/2020 1332  ? HGB 15.1 12/17/2020 0915  ? HCT 45.0 12/17/2020 0915  ? PLT Comment 12/17/2020 0915  ? MCV 94 12/17/2020 0915  ? MCH 31.4 12/17/2020 0915  ? MCH 31.1 10/08/2020 1332  ? MCHC 33.6 12/17/2020 0915  ? MCHC 33.9 10/08/2020 1332  ? RDW 14.3 12/17/2020 0915  ? LYMPHSABS 1.5 12/17/2020 0915  ? MONOABS 1.0 10/08/2020 1332  ? EOSABS 0.1 12/17/2020 0915  ? BASOSABS 0.0 12/17/2020 0915  ? ? ?BMET ?    ?Component Value Date/Time  ? NA 141 12/17/2020 0915  ? K 4.0 12/17/2020 0915  ? CL 102 12/17/2020 0915  ? CO2 24 12/17/2020 0915  ? GLUCOSE 85 12/17/2020 0915  ? GLUCOSE 96 10/08/2020 1332  ? BUN 19 12/17/2020 0915  ? CREATININE 0.73 12/17/2020 0915  ? CALCIUM 9.5 12/17/2020 0915  ? GFRNONAA >60 10/08/2020 1332  ? GFRAA 79 06/05/2020 0941  ? ? ?BNP ?No results found for: BNP ? ?ProBNP ?No results found for: PROBNP ? ?Imaging: ?Pulmonary Function Test [facility] Only ? ?Result Date: 08/05/2021 ?Spirometry Data Is Acceptable and Reproducible No  obvious evidence of Obstructive Airways disease or Restrictive Lung disease May consider small obstructive airways diease with scooping of expiratory limb on flow volume loops Consider outpatient follow up with Pulmonary if needed. Clinical Correlation Advised   ? ? ?Assessment & Plan:  ? ?Chronic cough ?- Patient has had a long standing hx chronic cough, never smoker. Cough previously felt to be multifactorial d/t PND and LPR with GERD. She was evaluated by GI and placed on PPI. Pulmonary function testing in February 2023 showed no overt obstructive airway disease or restrictive lung disease, possible small airway obstruction d/t scooping on flow vol loop. Recommend trial Advair HFA 115-80mcg 2 puffs twice daily. Continue Zyrtec-D prn congestion. FU in 4 weeks with Dr. Patsey Mitchell or APP.  ? ? ? ? ?Martyn Ehrich, NP ?08/13/2021 ? ?

## 2021-08-18 NOTE — Progress Notes (Signed)
Agree with the details of the visit as noted by Elizabeth Walsh, NP.  C. Laura Mahira Petras, MD [facility] 

## 2021-09-12 DIAGNOSIS — Z20822 Contact with and (suspected) exposure to covid-19: Secondary | ICD-10-CM | POA: Diagnosis not present

## 2021-09-13 DIAGNOSIS — Z20822 Contact with and (suspected) exposure to covid-19: Secondary | ICD-10-CM | POA: Diagnosis not present

## 2021-09-18 ENCOUNTER — Ambulatory Visit: Payer: Medicare Other | Admitting: Pulmonary Disease

## 2021-09-18 ENCOUNTER — Telehealth: Payer: Self-pay | Admitting: Pulmonary Disease

## 2021-09-22 NOTE — Telephone Encounter (Signed)
Called and spoke with patient, she has been rescheduled on 11/25/2021 at 10:30 am, advised to arrive by 10:15 am for check in.  Nothing further needed. ?

## 2021-09-25 DIAGNOSIS — R051 Acute cough: Secondary | ICD-10-CM | POA: Diagnosis not present

## 2021-09-25 DIAGNOSIS — R059 Cough, unspecified: Secondary | ICD-10-CM | POA: Diagnosis not present

## 2021-09-25 DIAGNOSIS — Z20822 Contact with and (suspected) exposure to covid-19: Secondary | ICD-10-CM | POA: Diagnosis not present

## 2021-09-27 ENCOUNTER — Other Ambulatory Visit: Payer: Self-pay | Admitting: Internal Medicine

## 2021-09-29 NOTE — Telephone Encounter (Signed)
Requested Prescriptions  ?Pending Prescriptions Disp Refills  ?? omeprazole (PRILOSEC) 40 MG capsule [Pharmacy Med Name: OMEPRAZOLE DR 40 MG CAPSULE] 90 capsule 0  ?  Sig: TAKE 1 CAPSULE BY MOUTH EVERY DAY  ?  ? Gastroenterology: Proton Pump Inhibitors Passed - 09/27/2021  9:24 AM  ?  ?  Passed - Valid encounter within last 12 months  ?  Recent Outpatient Visits   ?      ? 7 months ago Diverticulitis of large intestine without perforation or abscess without bleeding  ? [facility] Glean Hess, MD  ? 9 months ago Paroxysmal A-fib [facility])  ? [facility] Glean Hess, MD  ? 1 year ago Enteritis  ? [facility] Glean Hess, MD  ? 1 year ago Palpitations  ? [facility] Glean Hess, MD  ? 1 year ago LPRD (laryngopharyngeal reflux disease)  ? [facility] Glean Hess, MD  ?  ?  ?Future Appointments   ?        ? In 2 months Army Melia Jesse Sans, MD [facility] [place]  ?  ? ?  ?  ?  ? ? ?

## 2021-10-01 DIAGNOSIS — K635 Polyp of colon: Secondary | ICD-10-CM

## 2021-10-01 DIAGNOSIS — I7121 Aneurysm of the ascending aorta, without rupture: Secondary | ICD-10-CM | POA: Diagnosis not present

## 2021-10-01 DIAGNOSIS — I4891 Unspecified atrial fibrillation: Secondary | ICD-10-CM | POA: Diagnosis not present

## 2021-10-01 DIAGNOSIS — I724 Aneurysm of artery of lower extremity: Secondary | ICD-10-CM | POA: Diagnosis not present

## 2021-10-01 DIAGNOSIS — I4892 Unspecified atrial flutter: Secondary | ICD-10-CM | POA: Diagnosis not present

## 2021-10-01 DIAGNOSIS — R935 Abnormal findings on diagnostic imaging of other abdominal regions, including retroperitoneum: Secondary | ICD-10-CM

## 2021-10-01 DIAGNOSIS — I700 Atherosclerosis of aorta: Secondary | ICD-10-CM | POA: Diagnosis not present

## 2021-10-08 ENCOUNTER — Ambulatory Visit
Admission: EM | Admit: 2021-10-08 | Discharge: 2021-10-08 | Disposition: A | Discharge status: Home / Self Care | Payer: Medicare Other | Attending: Internal Medicine | Admitting: Internal Medicine

## 2021-10-08 DIAGNOSIS — L309 Dermatitis, unspecified: Secondary | ICD-10-CM | POA: Diagnosis not present

## 2021-10-08 MED ORDER — CLOTRIMAZOLE-BETAMETHASONE 1-0.05 % EX CREA: TOPICAL_CREAM | CUTANEOUS | 0 refills | Status: DC

## 2021-10-08 NOTE — Discharge Instructions (Addendum)
Please use medications as prescribed ?This is likely some form of contact skin irritation ?If you have worsening rash, itchiness, swelling or pain at the site please return to urgent care to be reevaluated ?

## 2021-10-08 NOTE — ED Triage Notes (Signed)
Patient is here for "Tick bite". Lower right side of back. It was about "3 wks ago". Removed by friend "but area looks red and angry". No fever.  ?

## 2021-10-09 NOTE — ED Provider Notes (Signed)
?[facility] ? ? ? ?CSN: [id number] ?Arrival date & time: 10/08/21  1056 ? ? ?  ? ?History   ?Chief Complaint ?Chief Complaint  ?Patient presents with  ? Tick Removal  ? ? ?HPI ?Connie Mitchell is a 88 y.o. female comes to urgent care with a itchy red patch in the right flank area.  Patient endorses a tick bite about 3 weeks ago.  The tick was on her body less than a day.  They were able to take the entire tick out with no mouthparts left behind in the skin.  Since then patient endorses itching and worsening redness in the area.  The itching and redness initially improved and has worsened over the past week or so.  No fever or chills.  No ulcerations.  No history of shingles.  No fever or chills.  No headache.  No generalized body aches or muscle pain.  The tick was not engorged and was attached for less than 24 hours.  Patient tried over-the-counter steroid cream with transient improvement in the rash.  The rash recurred after she stopped using the steroid cream.  ? ?HPI ? ?Past Medical History:  ?Diagnosis Date  ? A-fib ([place])   ? Allergy   ? Anxiety   ? Cancer [facility]) 1991  ? left breast. treated with rads and lumpectomy  ? Cancer [facility]) 2020  ? newly diagnosed, also left breast  ? Cataract 2017  ? Cellulitis and abscess of toe of left foot 01/2018  ? Dental bridge present   ? permanent - bottom  ? GERD (gastroesophageal reflux disease)   ? Hx of therapeutic radiation 1991  ? left breast  ? Laryngopharyngeal reflux   ? sometimes food goes down wrong way and she ends up in extreme coughing/choking scenario  ? Personal history of radiation therapy 1991  ? left breast ca  ? Primary osteoarthritis   ? Thoracic aortic aneurysm (TAA) ([place])   ? ? ?Patient Active Problem List  ? Diagnosis Date Noted  ? Abnormal CT of the abdomen 10/01/2021  ? Polyp of ascending colon 10/01/2021  ? Chronic cough 08/13/2021  ? Enteritis 07/14/2020  ? Rectal bleed 07/14/2020  ? Unspecified atrial fibrillation ([place]) 07/14/2020  ? Aortic  atherosclerosis ([place]) 12/12/2019  ? Axillary adenopathy 01/01/2019  ? 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 Osteopenia of neck of right femur 07/12/2018  ? Malignant neoplasm of upper-outer quadrant of left female breast ([place]) 07/07/2018  ? Thoracic ascending aortic aneurysm ([place]) 05/27/2018  ? LPRD (laryngopharyngeal reflux disease) 03/17/2018  ? Chronic foot pain 11/25/2017  ? Lumbar degenerative disc disease 09/24/2017  ? Hx of Lyme disease 09/24/2017  ? Hx of basal cell carcinoma 09/24/2017  ? Nuclear sclerotic cataract of left eye 08/09/2015  ? Osteoporosis 07/04/2014  ? Primary localized osteoarthrosis of shoulder region 12/13/2012  ? Osteoarthritis 12/18/2011  ? ? ?Past Surgical History:  ?Procedure Laterality Date  ? BREAST BIOPSY Right 02/22/2020  ? stereo bx, ribbon clip, path pending   ? BREAST LUMPECTOMY Left 1991  ? breast ca, lumpectomy  ? cataracts Bilateral   ? COLONOSCOPY WITH PROPOFOL N/A 04/28/2021  ? Procedure: COLONOSCOPY WITH BIOPSY;  Surgeon: Lucilla Lame, MD;  Location: [facility];  Service: Endoscopy;  Laterality: N/A;  ? CYSTOCELE REPAIR  2010  ? EYE SURGERY Bilateral 2017  ? cataracts extracted  ? JOINT REPLACEMENT    ? MASTECTOMY Left 07/25/2018  ? Invasive Mammary Carcinoma  ? MASTECTOMY W/ SENTINEL NODE BIOPSY Left  07/25/2018  ? Procedure: MASTECTOMY WITH SENTINEL LYMPH NODE BIOPSY;  Surgeon: Olean Ree, MD;  Location: [facility];  Service: General;  Laterality: Left;  ? OOPHORECTOMY  1995  ? ovarian mass - benign  ? POLYPECTOMY N/A 04/28/2021  ? Procedure: POLYPECTOMY;  Surgeon: Lucilla Lame, MD;  Location: [place];  Service: Endoscopy;  Laterality: N/A;  ? REPLACEMENT TOTAL KNEE Bilateral 2012,2013  ? SKIN BIOPSY Left   ? arm growth  ? TONSILLECTOMY  1958  ? TOTAL SHOULDER ARTHROPLASTY Right 2016  ? ? ?OB History   ?No obstetric history on file. ?  ? ? ? ?Home Medications   ? ?Prior to Admission medications   ?Medication Sig Start Date End Date Taking?  Authorizing Provider  ?clotrimazole-betamethasone (LOTRISONE) cream Apply to affected area 2 times daily prn 10/08/21  Yes Zeb Rawl, Myrene Galas, MD  ?mupirocin ointment (BACTROBAN) 2 % 1 application. 2 (two) times daily.   Yes [provider]  ?Calcium Carbonate-Vitamin D (CALCIUM-VITAMIN D) 600-125 MG-UNIT TABS Take 1 tablet by mouth 2 (two) times a day.     [provider]  ?fluticasone-salmeterol (ADVAIR HFA) 115-21 MCG/ACT inhaler Inhale 2 puffs into the lungs 2 (two) times daily. 08/13/21   Martyn Ehrich, NP  ?Multiple Vitamins-Minerals ([place]) CAPS     [provider]  ?omeprazole (PRILOSEC) 40 MG capsule TAKE 1 CAPSULE BY MOUTH EVERY DAY 09/29/21   Glean Hess, MD  ?Alveda Reasons 20 MG TABS tablet Take 20 mg by mouth at bedtime. 06/17/20   [provider]  ? ? ?Family History ?Family History  ?Problem Relation Age of Onset  ? Hypertension Mother   ? Alzheimer's disease Mother   ? Hearing loss Mother   ? Stroke Father   ? Cancer Brother   ?     prostate, rectal cancer  ? Atrial fibrillation Brother   ? Atrial fibrillation Brother   ? Stroke Maternal Uncle   ? Breast cancer Cousin 25  ?     paternal x 2  ? Cancer Other   ?     breast  ? ? ?Social History ?Social History  ? ?Tobacco Use  ? Smoking status: Never  ? Smokeless tobacco: Never  ? Tobacco comments:  ?  smoking cessation materials not required  ?Vaping Use  ? Vaping Use: Never used  ?Substance Use Topics  ? Alcohol use: Yes  ?  Comment: occasional  ? Drug use: Never  ? ? ? ?Allergies   ?Ciprofloxacin ? ? ?Review of Systems ?Review of Systems ?As per HPI ? ?Physical Exam ?Triage Vital Signs ?ED Triage Vitals  ?Enc Vitals Group  ?   BP 10/08/21 1102 121/80  ?   Pulse Rate 10/08/21 1102 88  ?   Resp 10/08/21 1102 20  ?   Temp 10/08/21 1102 98.4 ?F (36.9 ?C)  ?   Temp Source 10/08/21 1102 Oral  ?   SpO2 10/08/21 1102 99 %  ?   Weight 10/08/21 1101 169 lb 15.6 oz (77.1 kg)  ?   Height 10/08/21 1101 '5\' 3"'$  (1.6 m)   ?   Head Circumference --   ?   Peak Flow --   ?   Pain Score 10/08/21 1059 0  ?   Pain Loc --   ?   Pain Edu? --   ?   Excl. in [place]? --   ? ?No data found. ? ?Updated Vital Signs ?BP 121/80 (BP Location: Left Arm)  Pulse 88   Temp 98.4 ?F (36.9 ?C) (Oral)   Resp 20   Ht '5\' 3"'$  (1.6 m)   Wt 77.1 kg   SpO2 99%   BMI 30.11 kg/m?  ? ?Visual Acuity ?Right Eye Distance:   ?Left Eye Distance:   ?Bilateral Distance:   ? ?Right Eye Near:   ?Left Eye Near:    ?Bilateral Near:    ? ?Physical Exam ?Vitals and nursing note reviewed.  ?Constitutional:   ?   General: She is not in acute distress. ?   Appearance: She is not ill-appearing.  ?Cardiovascular:  ?   Rate and Rhythm: Normal rate and regular rhythm.  ?Skin: ?   Comments: Well-circumscribed rash in the right flank area measuring about 4 inches in the longest diameter.  The rash is oblong shaped with overlying erythema.  No excoriations noted.  No central clearing.  ?Neurological:  ?   Mental Status: She is alert.  ? ? ? ?UC Treatments / Results  ?Labs ?(all labs ordered are listed, but only abnormal results are displayed) ?Labs Reviewed - No data to display ? ?EKG ? ? ?Radiology ?No results found. ? ?Procedures ?Procedures (including critical care time) ? ?Medications Ordered in UC ?Medications - No data to display ? ?Initial Impression / Assessment and Plan / UC Course  ?I have reviewed the triage vital signs and the nursing notes. ? ?Pertinent labs & imaging results that were available during my care of the patient were reviewed by me and considered in my medical decision making (see chart for details). ? ?  ? ?1.  Dermatitis: ?Suspected fungal infection of the skin versus tick bite dermatitis ?Given the short duration of attachment of this take and the fact that the tick was not engorged reduces likelihood of tickborne infection.  I will treat patient with Lotrisone cream with return precautions if she has any fever, body aches, muscle ache or chills or  worsening rash. ?Final Clinical Impressions(s) / UC Diagnoses  ? ?Final diagnoses:  ?Dermatitis  ? ? ? ?Discharge Instructions   ? ?  ?Please use medications as prescribed ?This is likely some form of contact skin irritation ?

## 2021-10-17 DIAGNOSIS — Z20822 Contact with and (suspected) exposure to covid-19: Secondary | ICD-10-CM | POA: Diagnosis not present

## 2021-11-11 ENCOUNTER — Ambulatory Visit: Payer: Medicare Other | Admitting: Pulmonary Disease

## 2021-11-24 ENCOUNTER — Other Ambulatory Visit: Payer: Self-pay

## 2021-11-24 DIAGNOSIS — Z1231 Encounter for screening mammogram for malignant neoplasm of breast: Secondary | ICD-10-CM

## 2021-11-25 ENCOUNTER — Ambulatory Visit: Payer: Medicare Other | Admitting: Pulmonary Disease

## 2021-12-01 ENCOUNTER — Ambulatory Visit (INDEPENDENT_AMBULATORY_CARE_PROVIDER_SITE_OTHER): Payer: Medicare Other

## 2021-12-01 VITALS — BP 104/74 | HR 86 | Ht 63.0 in | Wt 166.5 lb

## 2021-12-01 DIAGNOSIS — Z0000 Encounter for general adult medical examination without abnormal findings: Secondary | ICD-10-CM

## 2021-12-01 DIAGNOSIS — Z780 Asymptomatic menopausal state: Secondary | ICD-10-CM | POA: Diagnosis not present

## 2021-12-01 NOTE — Progress Notes (Signed)
Subjective:   Connie Mitchell is a 88 y.o. female who presents for Medicare Annual (Subsequent) preventive examination.  Virtual Visit via Telephone Note  I connected with  [name] on 12/01/21 at 10:30 AM EDT by telephone and verified that I am speaking with the correct person using two identifiers.  Location: Patient: home Provider: [facility] Persons participating in the virtual visit: [place]   I discussed the limitations, risks, security and privacy concerns of performing an evaluation and management service by telephone and the availability of in person appointments. The patient expressed understanding and agreed to proceed.  Interactive audio and video telecommunications were attempted between this nurse and patient, however failed, due to patient having technical difficulties OR patient did not have access to video capability.  We continued and completed visit with audio only.  Some vital signs may be absent or patient reported.   [name], LPN   Review of Systems    Cardiac Risk Factors include: advanced age (>55mn, >>59women)     Objective:    Today's Vitals   12/01/21 1033  BP: 104/74  Pulse: 86  Weight: 166 lb 8 oz (75.5 kg)  Height: '5\' 3"'$  (1.6 m)   Body mass index is 29.49 kg/m.     12/01/2021   10:44 AM 10/08/2021   11:02 AM 04/28/2021    8:23 AM 04/19/2021   10:44 AM 10/08/2020    1:15 PM 07/14/2020    4:55 PM 11/27/2019   11:02 AM  Advanced Directives  Does Patient Have a Medical Advance Directive? No Yes No No No No No  Type of ASocial research officer, governmentLiving will       Would patient like information on creating a medical advance directive? No - Patient declined  No - Patient declined    No - Patient declined    Current Medications (verified) Outpatient Encounter Medications as of 12/01/2021  Medication Sig   Calcium Carbonate-Vitamin D (CALCIUM-VITAMIN D) 600-125 MG-UNIT TABS Take 1 tablet by mouth 2 (two)  times a day.    Multiple Vitamins-Minerals (OCUVITE ADULT 50+) CAPS    omeprazole (PRILOSEC) 40 MG capsule TAKE 1 CAPSULE BY MOUTH EVERY DAY   XARELTO 20 MG TABS tablet Take 20 mg by mouth at bedtime.   [DISCONTINUED] clotrimazole-betamethasone (LOTRISONE) cream Apply to affected area 2 times daily prn   [DISCONTINUED] fluticasone-salmeterol (ADVAIR HFA) 115-21 MCG/ACT inhaler Inhale 2 puffs into the lungs 2 (two) times daily.   [DISCONTINUED] mupirocin ointment (BACTROBAN) 2 % 1 application. 2 (two) times daily.   No facility-administered encounter medications on file as of 12/01/2021.    Allergies (verified) Advair hfa [fluticasone-salmeterol] and Ciprofloxacin   History: Past Medical History:  Diagnosis Date   [place]fib ([place]    Allergy    Anxiety    [place]   [place]. treated with rads and lumpectomy   [place] ([place]   newly diagnosed, also left breast   Cataract 2017   Cellulitis and abscess of toe of left foot 01/2018   Dental bridge present    permanent - bottom   GERD (gastroesophageal reflux disease)    Heart murmur 2017   Afib 2022   Hx of therapeutic radiation 1991   left breast   Laryngopharyngeal reflux    sometimes food goes down wrong way and she ends up in extreme coughing/choking scenario   Personal history of radiation therapy 1991   left breast ca   Primary osteoarthritis  Thoracic aortic aneurysm (TAA) [facility])    Past Surgical History:  Procedure Laterality Date   BREAST BIOPSY Right 02/22/2020   stereo bx, ribbon clip, path pending    BREAST LUMPECTOMY Left 1991   breast ca, lumpectomy   cataracts Bilateral    COLONOSCOPY WITH PROPOFOL N/A 04/28/2021   Procedure: COLONOSCOPY WITH BIOPSY;  Surgeon: Lucilla Lame, MD;  Location: [facility];  Service: Endoscopy;  Laterality: N/A;   CYSTOCELE REPAIR  2010   EYE SURGERY Bilateral 2017   cataracts extracted   JOINT REPLACEMENT     MASTECTOMY Left 07/25/2018   Invasive Mammary  Carcinoma   MASTECTOMY W/ SENTINEL NODE BIOPSY Left 07/25/2018   Procedure: MASTECTOMY WITH SENTINEL LYMPH NODE BIOPSY;  Surgeon: Olean Ree, MD;  Location: [facility];  Service: General;  Laterality: Left;   OOPHORECTOMY  1995   ovarian mass - benign   POLYPECTOMY N/A 04/28/2021   Procedure: POLYPECTOMY;  Surgeon: Lucilla Lame, MD;  Location: [facility];  Service: Endoscopy;  Laterality: N/A;   REPLACEMENT TOTAL KNEE Bilateral 2012,2013   SKIN BIOPSY Left    arm growth   TONSILLECTOMY  1958   TOTAL SHOULDER ARTHROPLASTY Right 2016   Family History  Problem Relation Age of Onset   Hypertension Mother    Alzheimer's disease Mother    Hearing loss Mother    Stroke Father    Hearing loss Father    Cancer Brother        prostate, rectal cancer   Atrial fibrillation Brother    Hearing loss Brother    Atrial fibrillation Brother    Stroke Maternal Uncle    Breast cancer Cousin 49       paternal x 2   Cancer Other        breast   Social History   Socioeconomic History   Marital status: Single    Spouse name: Not on file   Number of children: 0   Years of education: Not on file   Highest education level: Master's degree (e.g., MA, MS, MEng, MEd, MSW, MBA)  Occupational History   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    Comment: retired  Tobacco Use   Smoking status: Never   Smokeless tobacco: Never   Tobacco comments:    smoking cessation materials not required  Vaping Use   Vaping Use: Never used  Substance and Sexual Activity   Alcohol use: Yes    Comment: occasional beer or wine   Drug use: Never   Sexual activity: Not Currently    Birth control/protection: None  Other Topics Concern   Not on file  Social History Narrative   Pt lives with business partner. Owner of [employer]   Social Determinants of Health   Financial Resource Strain: Low Risk  (12/01/2021)   Overall Financial Resource Strain (CARDIA)    Difficulty of Paying  Living Expenses: Not hard at all  Food Insecurity: No Food Insecurity (12/01/2021)   Hunger Vital Sign    Worried About Running Out of Food in the Last Year: Never true    Ran Out of Food in the Last Year: Never true  Transportation Needs: No Transportation Needs (12/01/2021)   PRAPARE - Hydrologist (Medical): No    Lack of Transportation (Non-Medical): No  Physical Activity: Sufficiently Active (12/01/2021)   Exercise Vital Sign    Days of Exercise per Week: 7 days    Minutes of Exercise  per Session: 50 min  Stress: Stress Concern Present (12/01/2021)   [place]    Feeling of Stress : To some extent  Social Connections: Socially Isolated (12/01/2021)   Social Connection and Isolation Panel [NHANES]    Frequency of Communication with Friends and Family: More than three times a week    Frequency of Social Gatherings with Friends and Family: More than three times a week    Attends Religious Services: Never    Marine scientist or Organizations: No    Attends Music therapist: Never    Marital Status: Never married    Tobacco Counseling Counseling given: Not Answered Tobacco comments: smoking cessation materials not required   Clinical Intake:  Pre-visit preparation completed: Yes  Pain : No/denies pain     BMI - recorded: 29.49 Nutritional Status: BMI 25 -29 Overweight Nutritional Risks: None Diabetes: No  How often do you need to have someone help you when you read instructions, pamphlets, or other written materials from your doctor or pharmacy?: 1 - Never   Interpreter Needed?: No  Information entered by :: [name] LPN   Activities of Daily Living    12/01/2021   10:46 AM 04/28/2021    8:26 AM  In your present state of health, do you have any difficulty performing the following activities:  Hearing? 1 0  Vision? 1 0  Difficulty concentrating or  making decisions? 0 0  Walking or climbing stairs? 0 0  Dressing or bathing? 0 0  Doing errands, shopping? 0   Preparing Food and eating ? N   Using the Toilet? N   In the past six months, have you accidently leaked urine? Y   Comment wears pads for protection   Do you have problems with loss of bowel control? N   Managing your Medications? N   Managing your Finances? N   Housekeeping or managing your Housekeeping? N     Patient Care Team: Glean Hess, MD as PCP - General (Internal Medicine) Marlaine Hind, MD as Consulting Physician (Physical Medicine and Rehabilitation) Eustace Moore, MD as Consulting Physician (Neurosurgery) Tyler Pita, MD as Consulting Physician (Pulmonary Disease) Olean Ree, MD as Consulting Physician (General Surgery)  Indicate any recent Medical Services you may have received from other than [facility] providers in the past year (date may be approximate).     Assessment:   This is a routine wellness examination for Damariz.  Hearing/Vision screen Hearing Screening - Comments:: Pt complains of mild hearing difficulty Vision Screening - Comments:: Annual vision screenings [facility] at [facility]  Dietary issues and exercise activities discussed: Current Exercise Habits: Home exercise routine, Type of exercise: Other - see comments (exercise bike), Time (Minutes): 50, Frequency (Times/Week): 7, Weekly Exercise (Minutes/Week): 350, Intensity: Moderate, Exercise limited by: None identified   Goals Addressed   None    Depression Screen    12/01/2021   10:41 AM 02/05/2021   10:15 AM 12/17/2020    8:46 AM 11/27/2020   11:00 AM 07/22/2020    3:23 PM 07/22/2020    3:15 PM 06/05/2020    8:53 AM  PHQ 2/9 Scores  PHQ - 2 Score 0 0 0 0 0 0 0  PHQ- 9 Score  '2 2  1 '$ 0 1    Fall Risk    12/01/2021   10:45 AM 02/05/2021   10:16 AM 12/17/2020    8:47 AM 11/27/2020  11:03 AM 07/22/2020    3:22 PM  Fall Risk   Falls in the past year? 1 0 0 0 1  Number  falls in past yr: 0 0  0 0  Injury with Fall? 0 0  0 0  Risk for fall due to : No Fall Risks No Fall Risks  Impaired balance/gait History of fall(s);Impaired balance/gait  Follow up Falls prevention discussed Falls evaluation completed Falls evaluation completed Falls prevention discussed Falls evaluation completed    FALL RISK PREVENTION PERTAINING TO THE HOME:  Any stairs in or around the home? Yes  If so, are there any without handrails? Yes  - front steps Home free of loose throw rugs in walkways, pet beds, electrical cords, etc? Yes  Adequate lighting in your home to reduce risk of falls? Yes   ASSISTIVE DEVICES UTILIZED TO PREVENT FALLS:  Life alert? No  Use of a cane, walker or w/c? Yes  Grab bars in the bathroom? No  Shower chair or bench in shower? Yes  Elevated toilet seat or a handicapped toilet? Yes   TIMED UP AND GO:  Was the test performed? No . Telephonic visit.   Cognitive Function: Normal cognitive status assessed by direct observation by this Nurse Health Advisor. No abnormalities found.          11/27/2019   11:08 AM 11/21/2018    2:22 PM 11/17/2017    2:46 PM  6CIT Screen  What Year? 0 points 0 points 0 points  What month? 0 points 0 points 0 points  What time? 0 points 0 points 0 points  Count back from 20 0 points 0 points 0 points  Months in reverse 0 points 0 points 0 points  Repeat phrase 0 points 0 points 0 points  Total Score 0 points 0 points 0 points    Immunizations Immunization History  Administered Date(s) Administered   Influenza Split 06/28/2012   Influenza, High Dose Seasonal PF 03/14/2018   Influenza-Unspecified 04/15/2020   PFIZER(Purple Top)SARS-COV-2 Vaccination 08/04/2019, 08/25/2019, 03/29/2020, 01/04/2021   Pneumococcal Conjugate-13 07/04/2014   Pneumococcal Polysaccharide-23 06/16/2015   Tdap 03/11/2012   Zoster, Live 02/25/2016    TDAP status: Up to date  Flu vaccine status: due fall 2023  Pneumococcal vaccine status:  Up to date  Covid-19 vaccine status: Completed vaccines  Qualifies for Shingles Vaccine? Yes   Zostavax completed Yes   Shingrix Completed?: No.    Education has been provided regarding the importance of this vaccine. Patient has been advised to call insurance company to determine out of pocket expense if they have not yet received this vaccine. Advised may also receive vaccine at local pharmacy or Health Dept. Verbalized acceptance and understanding.  Screening Tests Health Maintenance  Topic Date Due   Zoster Vaccines- Shingrix (1 of 2) Never done   COVID-19 Vaccine (5 - Booster for Pfizer series) 03/01/2021   INFLUENZA VACCINE  01/13/2022   MAMMOGRAM  01/30/2022   TETANUS/TDAP  03/11/2022   Pneumonia Vaccine 68+ Years old  Completed   DEXA SCAN  Completed   HPV VACCINES  Aged Out    Health Maintenance  Health Maintenance Due  Topic Date Due   Zoster Vaccines- Shingrix (1 of 2) Never done   COVID-19 Vaccine (5 - Booster for Pfizer series) 03/01/2021    Colorectal cancer screening: No longer required.   Mammogram status: Completed 01/30/21. Repeat every year  Bone Density status: Completed 11/29/17. Results reflect: Bone density results: OSTEOPENIA. Repeat every 2 years.  Lung Cancer Screening: (Low Dose CT Chest recommended if Age 26-80 years, 30 pack-year currently smoking OR have quit w/in 15years.) does not qualify.  Additional Screening:  Hepatitis C Screening: does not qualify  Vision Screening: Recommended annual ophthalmology exams for early detection of glaucoma and other disorders of the eye. Is the patient up to date with their annual eye exam?  Yes  Who is the provider or what is the name of the office in which the patient attends annual eye exams? [facility].   Dental Screening: Recommended annual dental exams for proper oral hygiene  Community Resource Referral / Chronic Care Management: CRR required this visit?  No   CCM required this visit?   No      Plan:     I have personally reviewed and noted the following in the patient's chart:   Medical and social history Use of alcohol, tobacco or illicit drugs  Current medications and supplements including opioid prescriptions.  Functional ability and status Nutritional status Physical activity Advanced directives List of other physicians Hospitalizations, surgeries, and ER visits in previous 12 months Vitals Screenings to include cognitive, depression, and falls Referrals and appointments  In addition, I have reviewed and discussed with patient certain preventive protocols, quality metrics, and best practice recommendations. A written personalized care plan for preventive services as well as general preventive health recommendations were provided to patient.     [name], LPN   06/15/7508   Nurse Notes: none

## 2021-12-01 NOTE — Patient Instructions (Addendum)
Connie Mitchell , Thank you for taking time to come for your Medicare Wellness Visit. I appreciate your ongoing commitment to your health goals. Please review the following plan we discussed and let me know if I can assist you in the future.   Screening recommendations/referrals: Colonoscopy: no longer required Mammogram: done 01/30/21. Please call [phone] to schedule your mammogram and bone density screening Bone Density: done 11/22/17 Recommended yearly ophthalmology/optometry visit for glaucoma screening and checkup Recommended yearly dental visit for hygiene and checkup  Vaccinations: Influenza vaccine: due fall 2023 Pneumococcal vaccine: done 2017 Tdap vaccine: done 03/11/12 Shingles vaccine: Shingrix discussed. Please contact your pharmacy for coverage information.  Covid-19:done 08/04/19, 08/25/19, 03/29/20 & 01/04/21  Advanced directives: Please bring a copy of your health care power of attorney and living will to the office at your convenience once you have completed those documents  Conditions/risks identified: Keep up the great work!  Free hearing clinics offered in [facility]:   [place] [address], [place], [place] [address] [phone]  [facility] [place] [address], [place], [place] [address] [phone]   Next appointment: Follow up in one year for your annual wellness visit    Preventive Care 65 Years and Older, Female Preventive care refers to lifestyle choices and visits with your health care provider that can promote health and wellness. What does preventive care include? A yearly physical exam. This is also called an annual well check. Dental exams once or twice a year. Routine eye exams. Ask your health care provider how often you should have your eyes checked. Personal lifestyle choices, including: Daily care of your teeth and gums. Regular physical activity. Eating a healthy diet. Avoiding tobacco and drug  use. Limiting alcohol use. Practicing safe sex. Taking low-dose aspirin every day. Taking vitamin and mineral supplements as recommended by your health care provider. What happens during an annual well check? The services and screenings done by your health care provider during your annual well check will depend on your age, overall health, lifestyle risk factors, and family history of disease. Counseling  Your health care provider may ask you questions about your: Alcohol use. Tobacco use. Drug use. Emotional well-being. Home and relationship well-being. Sexual activity. Eating habits. History of falls. Memory and ability to understand (cognition). Work and work Statistician. Reproductive health. Screening  You may have the following tests or measurements: Height, weight, and BMI. Blood pressure. Lipid and cholesterol levels. These may be checked every 5 years, or more frequently if you are over 71 years old. Skin check. Lung cancer screening. You may have this screening every year starting at age 52 if you have a 30-pack-year history of smoking and currently smoke or have quit within the past 15 years. Fecal occult blood test (FOBT) of the stool. You may have this test every year starting at age 41. Flexible sigmoidoscopy or colonoscopy. You may have a sigmoidoscopy every 5 years or a colonoscopy every 10 years starting at age 35. Hepatitis C blood test. Hepatitis B blood test. Sexually transmitted disease (STD) testing. Diabetes screening. This is done by checking your blood sugar (glucose) after you have not eaten for a while (fasting). You may have this done every 1-3 years. Bone density scan. This is done to screen for osteoporosis. You may have this done starting at age 35. Mammogram. This may be done every 1-2 years. Talk to your health care provider about how often you should have regular mammograms. Talk with  your health care provider about your test results, treatment  options, and if necessary, the need for more tests. Vaccines  Your health care provider may recommend certain vaccines, such as: Influenza vaccine. This is recommended every year. Tetanus, diphtheria, and acellular pertussis (Tdap, Td) vaccine. You may need a Td booster every 10 years. Zoster vaccine. You may need this after age 45. Pneumococcal 13-valent conjugate (PCV13) vaccine. One dose is recommended after age 73. Pneumococcal polysaccharide (PPSV23) vaccine. One dose is recommended after age 21. Talk to your health care provider about which screenings and vaccines you need and how often you need them. This information is not intended to replace advice given to you by your health care provider. Make sure you discuss any questions you have with your health care provider. Document Released: 06/28/2015 Document Revised: 02/19/2016 Document Reviewed: 04/02/2015 Elsevier Interactive Patient Education  2017 [place] Prevention in the Home Falls can cause injuries. They can happen to people of all ages. There are many things you can do to make your home safe and to help prevent falls. What can I do on the outside of my home? Regularly fix the edges of walkways and driveways and fix any cracks. Remove anything that might make you trip as you walk through a door, such as a raised step or threshold. Trim any bushes or trees on the path to your home. Use bright outdoor lighting. Clear any walking paths of anything that might make someone trip, such as rocks or tools. Regularly check to see if handrails are loose or broken. Make sure that both sides of any steps have handrails. Any raised decks and porches should have guardrails on the edges. Have any leaves, snow, or ice cleared regularly. Use sand or salt on walking paths during winter. Clean up any spills in your garage right away. This includes oil or grease spills. What can I do in the bathroom? Use night lights. Install grab  bars by the toilet and in the tub and shower. Do not use towel bars as grab bars. Use non-skid mats or decals in the tub or shower. If you need to sit down in the shower, use a plastic, non-slip stool. Keep the floor dry. Clean up any water that spills on the floor as soon as it happens. Remove soap buildup in the tub or shower regularly. Attach bath mats securely with double-sided non-slip rug tape. Do not have throw rugs and other things on the floor that can make you trip. What can I do in the bedroom? Use night lights. Make sure that you have a light by your bed that is easy to reach. Do not use any sheets or blankets that are too big for your bed. They should not hang down onto the floor. Have a firm chair that has side arms. You can use this for support while you get dressed. Do not have throw rugs and other things on the floor that can make you trip. What can I do in the kitchen? Clean up any spills right away. Avoid walking on wet floors. Keep items that you use a lot in easy-to-reach places. If you need to reach something above you, use a strong step stool that has a grab bar. Keep electrical cords out of the way. Do not use floor polish or wax that makes floors slippery. If you must use wax, use non-skid floor wax. Do not have throw rugs and other things on the floor that can make you trip.  What can I do with my stairs? Do not leave any items on the stairs. Make sure that there are handrails on both sides of the stairs and use them. Fix handrails that are broken or loose. Make sure that handrails are as long as the stairways. Check any carpeting to make sure that it is firmly attached to the stairs. Fix any carpet that is loose or worn. Avoid having throw rugs at the top or bottom of the stairs. If you do have throw rugs, attach them to the floor with carpet tape. Make sure that you have a light switch at the top of the stairs and the bottom of the stairs. If you do not have them,  ask someone to add them for you. What else can I do to help prevent falls? Wear shoes that: Do not have high heels. Have rubber bottoms. Are comfortable and fit you well. Are closed at the toe. Do not wear sandals. If you use a stepladder: Make sure that it is fully opened. Do not climb a closed stepladder. Make sure that both sides of the stepladder are locked into place. Ask someone to hold it for you, if possible. Clearly mark and make sure that you can see: Any grab bars or handrails. First and last steps. Where the edge of each step is. Use tools that help you move around (mobility aids) if they are needed. These include: Canes. Walkers. Scooters. Crutches. Turn on the lights when you go into a dark area. Replace any light bulbs as soon as they burn out. Set up your furniture so you have a clear path. Avoid moving your furniture around. If any of your floors are uneven, fix them. If there are any pets around you, be aware of where they are. Review your medicines with your doctor. Some medicines can make you feel dizzy. This can increase your chance of falling. Ask your doctor what other things that you can do to help prevent falls. This information is not intended to replace advice given to you by your health care provider. Make sure you discuss any questions you have with your health care provider. Document Released: 03/28/2009 Document Revised: 11/07/2015 Document Reviewed: 07/06/2014 Elsevier Interactive Patient Education  2017 [employer].

## 2021-12-19 ENCOUNTER — Ambulatory Visit (INDEPENDENT_AMBULATORY_CARE_PROVIDER_SITE_OTHER): Payer: Medicare Other | Admitting: Internal Medicine

## 2021-12-19 ENCOUNTER — Encounter: Payer: Self-pay | Admitting: Internal Medicine

## 2021-12-19 VITALS — BP 122/74 | HR 63 | Ht 63.0 in | Wt 167.0 lb

## 2021-12-19 DIAGNOSIS — I480 Paroxysmal atrial fibrillation: Secondary | ICD-10-CM | POA: Diagnosis not present

## 2021-12-19 DIAGNOSIS — K219 Gastro-esophageal reflux disease without esophagitis: Secondary | ICD-10-CM

## 2021-12-19 DIAGNOSIS — M810 Age-related osteoporosis without current pathological fracture: Secondary | ICD-10-CM | POA: Diagnosis not present

## 2021-12-19 DIAGNOSIS — I700 Atherosclerosis of aorta: Secondary | ICD-10-CM

## 2021-12-19 DIAGNOSIS — I7121 Aneurysm of the ascending aorta, without rupture: Secondary | ICD-10-CM | POA: Diagnosis not present

## 2021-12-19 DIAGNOSIS — E785 Hyperlipidemia, unspecified: Secondary | ICD-10-CM | POA: Diagnosis not present

## 2021-12-19 DIAGNOSIS — D6869 Other thrombophilia: Secondary | ICD-10-CM

## 2021-12-19 NOTE — Progress Notes (Signed)
Date:  12/19/2021   Name:  Connie Mitchell   DOB:  1934-05-24   MRN:  [id number]   Chief Complaint: Annual Exam (Breast exam. ) Connie Mitchell is a 88 y.o. female who presents today for her Complete Annual Exam. She feels fairly well. She reports doing exercising bike 10.5 miles daily. She reports she is sleeping fairly well. Breast complaints - none.  Reports doing well overall.  She has had some mild left-sided rib discomfort for the past several days after working in the yard with a tractor and heavy cleaning up.  Mammogram: 01/2021  scheduled DEXA: 11/2017 - scheduled Pap smear: discontinued Colonoscopy: 04/2021  One 2 mm polyp in the cecum, removed with a cold biopsy forceps. Resected and retrieved. - Two 2 to 3 mm polyps in the ascending colon, removed with a cold biopsy forceps. Resected and retrieved. - Diverticulosis in the entire examined colon. - Non-bleeding internal hemorrhoids.  Health Maintenance Due  Topic Date Due   Zoster Vaccines- Shingrix (1 of 2) Never done    Immunization History  Administered Date(s) Administered   Influenza Split 06/28/2012   Influenza, High Dose Seasonal PF 03/14/2018   Influenza-Unspecified 04/15/2020   PFIZER(Purple Top)SARS-COV-2 Vaccination 08/04/2019, 08/25/2019, 03/29/2020, 01/04/2021   Pneumococcal Conjugate-13 07/04/2014   Pneumococcal Polysaccharide-23 06/16/2015   Tdap 03/11/2012   Zoster, Live 02/25/2016    Cough This is a chronic problem. Episode onset: due to reflux. The cough is Non-productive. Associated symptoms include chest pain (left sided chest wall pain). Pertinent negatives include no chills, fever, headaches, rash, shortness of breath or wheezing. Treatments tried: PPI.  Osteoporosis - on Prolia per oncology.  Atrial fibrillation - per cardiology note 09/2021: Paroxysmal atrial fibrillation (CMS-HCC)-ultra monitor revealed predominantly sinus rhythm but approximately 11 to 12% A. fib burden. Will place a Holter monitor  for 3 days to determine her A. fib burden. Her CHA2DS2-VASc score is 3. Will start her on Xarelto 20 mg daily. Will follow for bleeding. Rate is well controlled and she is relatively bradycardic and hypotensive at home and therefore we will continue to refrain from using any rate related medications at present. I48.0    Lab Results  Component Value Date   NA 141 12/17/2020   K 4.0 12/17/2020   CO2 24 12/17/2020   GLUCOSE 85 12/17/2020   BUN 19 12/17/2020   CREATININE 0.73 12/17/2020   CALCIUM 9.5 12/17/2020   EGFR 81 12/17/2020   GFRNONAA >60 10/08/2020   Lab Results  Component Value Date   CHOL 195 12/17/2020   HDL 60 12/17/2020   LDLCALC 120 (H) 12/17/2020   TRIG 80 12/17/2020   CHOLHDL 3.3 12/17/2020   Lab Results  Component Value Date   TSH 4.380 12/17/2020   No results found for: "HGBA1C" Lab Results  Component Value Date   WBC 5.8 12/17/2020   HGB 15.1 12/17/2020   HCT 45.0 12/17/2020   MCV 94 12/17/2020   PLT Comment 12/17/2020   Lab Results  Component Value Date   ALT 17 12/17/2020   AST 20 12/17/2020   ALKPHOS 64 12/17/2020   BILITOT 0.7 12/17/2020   No results found for: "25OHVITD2", "25OHVITD3", "VD25OH"   Review of Systems  Constitutional:  Negative for chills, fatigue and fever.  HENT:  Negative for congestion, hearing loss, tinnitus, trouble swallowing and voice change.   Eyes:  Negative for visual disturbance.  Respiratory:  Positive for cough. Negative for chest tightness, shortness of breath and wheezing.   Cardiovascular:  Positive for chest pain (left sided chest wall pain). Negative for palpitations and leg swelling.  Gastrointestinal:  Negative for abdominal pain, constipation, diarrhea and vomiting.  Endocrine: Negative for polydipsia and polyuria.  Genitourinary:  Negative for dysuria, frequency, genital sores, vaginal bleeding and vaginal discharge.  Musculoskeletal:  Negative for arthralgias, gait problem and joint swelling.  Skin:   Negative for color change and rash.  Neurological:  Negative for dizziness, tremors, light-headedness and headaches.  Hematological:  Negative for adenopathy. Does not bruise/bleed easily.  Psychiatric/Behavioral:  Negative for dysphoric mood and sleep disturbance. The patient is not nervous/anxious.     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[place]) 12/30/2018   Osteopenia of neck of right femur 07/12/2018   Malignant neoplasm of upper-outer quadrant of left female breast ([place]) 07/07/2018   Thoracic ascending aortic aneurysm ([place]) 05/27/2018   LPRD (laryngopharyngeal reflux disease) 03/17/2018   Chronic foot pain 11/25/2017   Lumbar degenerative disc disease 09/24/2017   Hx of Lyme disease 09/24/2017   Hx of basal cell carcinoma 09/24/2017   Nuclear sclerotic cataract of left eye 08/09/2015   Osteoporosis 07/04/2014   Primary localized osteoarthrosis of shoulder region 12/13/2012   Osteoarthritis 12/18/2011    Allergies  Allergen Reactions   Advair Hfa [Fluticasone-Salmeterol] Other (See Comments)    Thrush, flushing and headache   Ciprofloxacin Nausea And Vomiting    Past Surgical History:  Procedure Laterality Date   BREAST BIOPSY Right 02/22/2020   stereo bx, ribbon clip, path pending    BREAST LUMPECTOMY Left 1991   breast ca, lumpectomy   cataracts Bilateral    COLONOSCOPY WITH PROPOFOL N/A 04/28/2021   Procedure: COLONOSCOPY WITH BIOPSY;  Surgeon: Lucilla Lame, MD;  Location: [place];  Service: Endoscopy;  Laterality: N/A;   CYSTOCELE REPAIR  2010   EYE SURGERY Bilateral 2017   cataracts extracted    JOINT REPLACEMENT     MASTECTOMY Left 07/25/2018   Invasive Mammary Carcinoma   MASTECTOMY W/ SENTINEL NODE BIOPSY Left 07/25/2018   Procedure: MASTECTOMY WITH SENTINEL LYMPH NODE BIOPSY;  Surgeon: Olean Ree, MD;  Location: [facility];  Service: General;  Laterality: Left;   OOPHORECTOMY  1995   ovarian mass - benign   POLYPECTOMY N/A 04/28/2021   Procedure: POLYPECTOMY;  Surgeon: Lucilla Lame, MD;  Location: [place];  Service: Endoscopy;  Laterality: N/A;   REPLACEMENT TOTAL KNEE Bilateral 2012,2013   SKIN BIOPSY Left    arm growth   TONSILLECTOMY  1958   TOTAL SHOULDER ARTHROPLASTY Right 2016    Social History   Tobacco Use   Smoking status: Never   Smokeless tobacco: Never   Tobacco comments:    smoking cessation materials not required  Vaping Use   Vaping Use: Never used  Substance Use Topics   Alcohol use: Yes    Comment: occasional beer or wine   Drug use: Never     Medication list has been reviewed and updated.  Current Meds  Medication Sig   Calcium Carbonate-Vitamin D (CALCIUM-VITAMIN D) 600-125 MG-UNIT TABS Take 1 tablet by mouth 2 (two) times a day.    Multiple Vitamins-Minerals (OCUVITE  ADULT 50+) CAPS    omeprazole (PRILOSEC) 40 MG capsule TAKE 1 CAPSULE BY MOUTH EVERY DAY   XARELTO 20 MG TABS tablet Take 20 mg by mouth at bedtime.       12/19/2021    8:48 AM 02/05/2021   10:16 AM 12/17/2020    8:47 AM 07/22/2020    3:24 PM  GAD 7 : Generalized Anxiety Score  Nervous, Anxious, on Edge 0 0 0 2  Control/stop worrying 0 0 0 2  Worry too much - different things 0 0 0 2  Trouble relaxing 0 0 0 0  Restless 0 0 0 0  Easily annoyed or irritable 1 0 0 2  Afraid - awful might happen 0 0 0 0  Total GAD 7 Score 1 0 0 8  Anxiety Difficulty Not difficult at all Not difficult at all Not difficult at all        12/19/2021    8:48 AM 12/01/2021   10:41 AM 02/05/2021   10:15 AM  Depression screen PHQ 2/9  Decreased Interest 0 0 0  Down, Depressed,  Hopeless 0 0 0  PHQ - 2 Score 0 0 0  Altered sleeping 1  1  Tired, decreased energy 1  1  Change in appetite 0  0  Feeling bad or failure about yourself  0  0  Trouble concentrating 0  0  Moving slowly or fidgety/restless 0  0  Suicidal thoughts 0  0  PHQ-9 Score 2  2  Difficult doing work/chores Not difficult at all  Not difficult at all    BP Readings from Last 3 Encounters:  12/19/21 122/74  12/01/21 104/74  10/08/21 121/80    Physical Exam Vitals and nursing note reviewed.  Constitutional:      General: She is not in acute distress.    Appearance: She is well-developed.  HENT:     Head: Normocephalic and atraumatic.     Right Ear: Tympanic membrane and ear canal normal.     Left Ear: Tympanic membrane and ear canal normal.     Nose:     Right Sinus: No maxillary sinus tenderness.     Left Sinus: No maxillary sinus tenderness.  Eyes:     General: No scleral icterus.       Right eye: No discharge.        Left eye: No discharge.     Conjunctiva/sclera: Conjunctivae normal.  Neck:     Thyroid: No thyromegaly.     Vascular: No carotid bruit.  Cardiovascular:     Rate and Rhythm: Normal rate and regular rhythm.     Pulses: Normal pulses.     Heart sounds: Normal heart sounds.  Pulmonary:     Effort: Pulmonary effort is normal. No respiratory distress.     Breath sounds: No wheezing.  Chest:  Breasts:    Right: No mass, nipple discharge, skin change or tenderness.     Left: Absent.       Comments: Tenderness to palpation - no mass or bruising noted Abdominal:     General: Bowel sounds are normal.     Palpations: Abdomen is soft.     Tenderness: There is no abdominal tenderness.  Musculoskeletal:     Cervical back: Normal range of motion. No erythema.     Right lower leg: No edema.     Left lower leg: No edema.  Lymphadenopathy:     Cervical: No cervical adenopathy.  Skin:    General:  Skin is warm and dry.     Findings: No rash.  Neurological:     Mental  Status: She is alert and oriented to person, place, and time.     Cranial Nerves: No cranial nerve deficit.     Sensory: No sensory deficit.     Deep Tendon Reflexes: Reflexes are normal and symmetric.  Psychiatric:        Attention and Perception: Attention normal.        Mood and Affect: Mood normal.     Wt Readings from Last 3 Encounters:  12/19/21 167 lb (75.8 kg)  12/01/21 166 lb 8 oz (75.5 kg)  10/08/21 169 lb 15.6 oz (77.1 kg)    BP 122/74   Pulse 63   Ht '5\' 3"'  (1.6 m)   Wt 167 lb (75.8 kg)   SpO2 96%   BMI 29.58 kg/m   Assessment and Plan: 1. Paroxysmal atrial fibrillation (HCC) Paroxysmal A-fib with good rate control on new medications. She is compliant with anticoagulation without bleeding issues. - Comprehensive metabolic panel - TSH  2. LPRD (laryngopharyngeal reflux disease) Symptoms are well controlled on daily omeprazole She denies choking, vomiting, change in appetite or bowel habits. - CBC with Differential/Platelet  3. Age-related osteoporosis without current pathological fracture She continues on vitamin D and calcium with no recent or history of fracture Previously on Prolia injections but these appear to have been discontinued by hematology - VITAMIN D 25 Hydroxy (Vit-D Deficiency, Fractures)  4. Mild hyperlipidemia Continue to work on low-fat diet; patient declined statin therapy - Lipid panel  5. Aortic atherosclerosis (HCC) Seen on CT scan; patient declined statin therapy - Lipid panel  6. Acquired thrombophilia ([place]) Doing well on anticoagulation for aortic aneurysm and paroxysmal atrial fibrillation  7. Aneurysm of ascending aorta without rupture ([place]) Overdue for CT follow-up Patient tried contacting the vascular surgeon but was not scheduled for an appointment-I will go ahead and order her follow-up CT angio - CT ANGIO CHEST AORTA W/CM & OR WO/CM; Future   Partially dictated using Editor, commissioning. Any errors are  unintentional.  Halina Maidens, MD [facility]  12/19/2021

## 2021-12-20 LAB — VITAMIN D 25 HYDROXY (VIT D DEFICIENCY, FRACTURES): Vit D, 25-Hydroxy: 42.6 ng/mL (ref 30.0–100.0)

## 2021-12-20 LAB — CBC WITH DIFFERENTIAL/PLATELET
Basophils Absolute: 0 10*3/uL (ref 0.0–0.2)
Basos: 1 %
EOS (ABSOLUTE): 0.1 10*3/uL (ref 0.0–0.4)
Eos: 2 %
Hematocrit: 43.5 % (ref 34.0–46.6)
Hemoglobin: 14.3 g/dL (ref 11.1–15.9)
Immature Grans (Abs): 0 10*3/uL (ref 0.0–0.1)
Immature Granulocytes: 0 %
Lymphocytes Absolute: 1.5 10*3/uL (ref 0.7–3.1)
Lymphs: 24 %
MCH: 31.1 pg (ref 26.6–33.0)
MCHC: 32.9 g/dL (ref 31.5–35.7)
MCV: 95 fL (ref 79–97)
Monocytes Absolute: 0.5 10*3/uL (ref 0.1–0.9)
Monocytes: 7 %
Neutrophils Absolute: 4.1 10*3/uL (ref 1.4–7.0)
Neutrophils: 66 %
RBC: 4.6 x10E6/uL (ref 3.77–5.28)
RDW: 14 % (ref 11.7–15.4)
WBC: 6.3 10*3/uL (ref 3.4–10.8)

## 2021-12-20 LAB — COMPREHENSIVE METABOLIC PANEL
ALT: 15 IU/L (ref 0–32)
AST: 17 IU/L (ref 0–40)
Albumin/Globulin Ratio: 1.6 (ref 1.2–2.2)
Albumin: 4.1 g/dL (ref 3.6–4.6)
Alkaline Phosphatase: 68 IU/L (ref 44–121)
BUN/Creatinine Ratio: 24 (ref 12–28)
BUN: 17 mg/dL (ref 8–27)
Bilirubin Total: 0.5 mg/dL (ref 0.0–1.2)
CO2: 22 mmol/L (ref 20–29)
Calcium: 9.4 mg/dL (ref 8.7–10.3)
Chloride: 103 mmol/L (ref 96–106)
Creatinine, Ser: 0.72 mg/dL (ref 0.57–1.00)
Globulin, Total: 2.6 g/dL (ref 1.5–4.5)
Glucose: 82 mg/dL (ref 70–99)
Potassium: 4.3 mmol/L (ref 3.5–5.2)
Sodium: 141 mmol/L (ref 134–144)
Total Protein: 6.7 g/dL (ref 6.0–8.5)
eGFR: 81 mL/min/{1.73_m2} (ref >59)

## 2021-12-20 LAB — LIPID PANEL
Chol/HDL Ratio: 3.3 ratio (ref 0.0–4.4)
Cholesterol, Total: 179 mg/dL (ref 100–199)
HDL: 55 mg/dL (ref >39)
LDL Chol Calc (NIH): 100 mg/dL — ABNORMAL HIGH (ref 0–99)
Triglycerides: 139 mg/dL (ref 0–149)
VLDL Cholesterol Cal: 24 mg/dL (ref 5–40)

## 2021-12-20 LAB — TSH: TSH: 5.47 u[IU]/mL — ABNORMAL HIGH (ref 0.450–4.500)

## 2021-12-24 ENCOUNTER — Other Ambulatory Visit: Payer: Self-pay | Admitting: Internal Medicine

## 2021-12-24 NOTE — Telephone Encounter (Signed)
Requested Prescriptions  Pending Prescriptions Disp Refills  . omeprazole (PRILOSEC) 40 MG capsule [Pharmacy Med Name: OMEPRAZOLE DR 40 MG CAPSULE] 90 capsule 0    Sig: TAKE 1 CAPSULE BY MOUTH EVERY DAY     Gastroenterology: Proton Pump Inhibitors Passed - 12/24/2021  8:26 AM      Passed - Valid encounter within last 12 months    Recent Outpatient Visits          5 days ago Paroxysmal atrial fibrillation [facility])   [facility] Glean Hess, MD   10 months ago Diverticulitis of large intestine without perforation or abscess without bleeding   [facility] Glean Hess, MD   1 year ago Paroxysmal A-fib [facility])   [facility] Glean Hess, MD   1 year ago [facility] Glean Hess, MD   1 year ago Palpitations   [facility] Glean Hess, MD      Future Appointments            In 12 months Army Melia Jesse Sans, MD [facility], [facility]

## 2021-12-25 DIAGNOSIS — H903 Sensorineural hearing loss, bilateral: Secondary | ICD-10-CM | POA: Diagnosis not present

## 2021-12-26 ENCOUNTER — Ambulatory Visit
Admission: RE | Admit: 2021-12-26 | Discharge: 2021-12-26 | Disposition: A | Discharge status: Home / Self Care | Payer: Medicare Other | Source: Ambulatory Visit | Attending: Internal Medicine | Admitting: Internal Medicine

## 2021-12-26 DIAGNOSIS — I7121 Aneurysm of the ascending aorta, without rupture: Secondary | ICD-10-CM | POA: Diagnosis not present

## 2021-12-26 DIAGNOSIS — K760 Fatty (change of) liver, not elsewhere classified: Secondary | ICD-10-CM | POA: Diagnosis not present

## 2021-12-26 DIAGNOSIS — J439 Emphysema, unspecified: Secondary | ICD-10-CM | POA: Diagnosis not present

## 2021-12-26 DIAGNOSIS — I712 Thoracic aortic aneurysm, without rupture, unspecified: Secondary | ICD-10-CM | POA: Diagnosis not present

## 2021-12-26 MED ORDER — IOHEXOL 350 MG/ML SOLN
100.0000 mL | Freq: Once | INTRAVENOUS | Status: AC | PRN
  Administered 2021-12-26: 100 mL via INTRAVENOUS

## 2021-12-30 NOTE — Progress Notes (Signed)
Please review.  KP

## 2021-12-31 NOTE — Progress Notes (Signed)
MRN : [id number]  Connie Mitchell is a 88 y.o. (Nov 06, 1933) female who presents with chief complaint of check circulation.  History of Present Illness:   The patient returns to the office for surveillance of a known ascending thoracic aortic aneurysm. Patient denies chest pain or back pain, no other abdominal complaints. No changes suggesting embolic episodes.   The patient is also followed for popliteal artery aneurysms.  Patient denies pain behind the knee.  There has been no interval development of claudication or rest pain symptoms of the lower extremities.   There have been no interval changes in the patient's overall health care since his last visit.   Patient denies amaurosis fugax or TIA symptoms. The patient denies angina or shortness of breath.    CT angiogram of the thoracic aorta arteries (12/26/2021) shows an TAA measured 4.4 cm.  No change compared to last CT scan.  Duplex ultrasound of the popliteal arteries dated January 18, 2019 demonstrates the right popliteal artery is 0.86 cm and the left popliteal artery is 0.87 cm  No outpatient medications have been marked as taking for the 01/01/22 encounter (Appointment) with Delana Meyer, Dolores Lory, MD.    Past Medical History:  Diagnosis Date   A-fib [facility])    Allergy    Anxiety    Cancer ([place]) 1991   left breast. treated with rads and lumpectomy   Cancer ([place]) 2020   newly diagnosed, also left breast   Cataract 2017   Cellulitis and abscess of toe of left foot 01/2018   Dental bridge present    permanent - bottom   GERD (gastroesophageal reflux disease)    Heart murmur 2017   Afib 2022   Hx of therapeutic radiation 1991   left breast   Laryngopharyngeal reflux    sometimes food goes down wrong way and she ends up in extreme coughing/choking scenario   Personal history of radiation therapy 1991   left breast ca   Primary osteoarthritis    Thoracic aortic aneurysm (TAA) ([place])     Past Surgical History:   Procedure Laterality Date   BREAST BIOPSY Right 02/22/2020   stereo bx, ribbon clip, path pending    BREAST LUMPECTOMY Left 1991   breast ca, lumpectomy   cataracts Bilateral    COLONOSCOPY WITH PROPOFOL N/A 04/28/2021   Procedure: COLONOSCOPY WITH BIOPSY;  Surgeon: Lucilla Lame, MD;  Location: [place];  Service: Endoscopy;  Laterality: N/A;   CYSTOCELE REPAIR  2010   EYE SURGERY Bilateral 2017   cataracts extracted   JOINT REPLACEMENT     MASTECTOMY Left 07/25/2018   Invasive Mammary Carcinoma   MASTECTOMY W/ SENTINEL NODE BIOPSY Left 07/25/2018   Procedure: MASTECTOMY WITH SENTINEL LYMPH NODE BIOPSY;  Surgeon: Olean Ree, MD;  Location: [facility];  Service: General;  Laterality: Left;   OOPHORECTOMY  1995   ovarian mass - benign   POLYPECTOMY N/A 04/28/2021   Procedure: POLYPECTOMY;  Surgeon: Lucilla Lame, MD;  Location: [place];  Service: Endoscopy;  Laterality: N/A;   REPLACEMENT TOTAL KNEE Bilateral 2012,2013   SKIN BIOPSY Left    arm growth   TONSILLECTOMY  1958   TOTAL SHOULDER ARTHROPLASTY Right 2016    Social History Social History   Tobacco Use   Smoking status: Never   Smokeless tobacco: Never   Tobacco comments:    smoking cessation materials not required  Vaping Use  Vaping Use: Never used  Substance Use Topics   Alcohol use: Yes    Comment: occasional beer or wine   Drug use: Never    Family History Family History  Problem Relation Age of Onset   Hypertension Mother    Alzheimer's disease Mother    Hearing loss Mother    Stroke Father    Hearing loss Father    Cancer Brother        prostate, rectal cancer   Atrial fibrillation Brother    Hearing loss Brother    Atrial fibrillation Brother    Stroke Maternal Uncle    Breast cancer Cousin 3       paternal x 2   Cancer Other        breast    Allergies  Allergen Reactions   Advair Hfa [Fluticasone-Salmeterol] Other (See Comments)    Thrush, flushing and headache    Ciprofloxacin Nausea And Vomiting     REVIEW OF SYSTEMS (Negative unless checked)  Constitutional: '[]'$ Weight loss  '[]'$ Fever  '[]'$ Chills Cardiac: '[]'$ Chest pain   '[]'$ Chest pressure   '[]'$ Palpitations   '[]'$ Shortness of breath when laying flat   '[]'$ Shortness of breath with exertion. Vascular:  '[x]'$ Pain in legs with walking   '[]'$ Pain in legs at rest  '[]'$ History of DVT   '[]'$ Phlebitis   '[]'$ Swelling in legs   '[]'$ Varicose veins   '[]'$ Non-healing ulcers Pulmonary:   '[]'$ Uses home oxygen   '[]'$ Productive cough   '[]'$ Hemoptysis   '[]'$ Wheeze  '[]'$ COPD   '[]'$ Asthma Neurologic:  '[]'$ Dizziness   '[]'$ Seizures   '[]'$ History of stroke   '[]'$ History of TIA  '[]'$ Aphasia   '[]'$ Vissual changes   '[]'$ Weakness or numbness in arm   '[]'$ Weakness or numbness in leg Musculoskeletal:   '[]'$ Joint swelling   '[]'$ Joint pain   '[]'$ Low back pain Hematologic:  '[]'$ Easy bruising  '[]'$ Easy bleeding   '[]'$ Hypercoagulable state   '[]'$ Anemic Gastrointestinal:  '[]'$ Diarrhea   '[]'$ Vomiting  '[]'$ Gastroesophageal reflux/heartburn   '[]'$ Difficulty swallowing. Genitourinary:  '[]'$ Chronic kidney disease   '[]'$ Difficult urination  '[]'$ Frequent urination   '[]'$ Blood in urine Skin:  '[]'$ Rashes   '[]'$ Ulcers  Psychological:  '[]'$ History of anxiety   '[]'$  History of major depression.  Physical Examination  There were no vitals filed for this visit. There is no height or weight on file to calculate BMI. Gen: WD/WN, NAD Head: [place]/AT, No temporalis wasting.  Ear/Nose/Throat: Hearing grossly intact, nares w/o erythema or drainage Eyes: PER, EOMI, sclera nonicteric.  Neck: Supple, no masses.  No bruit or JVD.  Pulmonary:  Good air movement, no audible wheezing, no use of accessory muscles.  Cardiac: RRR, normal S1, S2, no Murmurs. Vascular:  popliteal pulses are not increased Vessel Right Left  Radial Palpable Palpable  PT Not Palpable Not Palpable  DP Not Palpable Not Palpable  Gastrointestinal: soft, non-distended. No guarding/no peritoneal signs.  Musculoskeletal: M/S 5/5 throughout.  No visible deformity.   Neurologic: CN 2-12 intact. Pain and light touch intact in extremities.  Symmetrical.  Speech is fluent. Motor exam as listed above. Psychiatric: Judgment intact, Mood & affect appropriate for pt's clinical situation. Dermatologic: No rashes or ulcers noted.  No changes consistent with cellulitis.   CBC Lab Results  Component Value Date   WBC 6.3 12/19/2021   HGB 14.3 12/19/2021   HCT 43.5 12/19/2021   MCV 95 12/19/2021   PLT CANCELED 12/19/2021    BMET    Component Value Date/Time   NA 141 12/19/2021 0946   K 4.3 12/19/2021 0946   CL 103  12/19/2021 0946   CO2 22 12/19/2021 0946   GLUCOSE 82 12/19/2021 0946   GLUCOSE 96 10/08/2020 1332   BUN 17 12/19/2021 0946   CREATININE 0.72 12/19/2021 0946   CALCIUM 9.4 12/19/2021 0946   GFRNONAA >60 10/08/2020 1332   GFRAA 79 06/05/2020 0941   Estimated Creatinine Clearance: 49.2 mL/min (by C-G formula based on SCr of 0.72 mg/dL).  COAG No results found for: "INR", "PROTIME"  Radiology CT ANGIO CHEST AORTA W/CM & OR WO/CM  Result Date: 12/26/2021 CLINICAL DATA:  Thoracic aortic aneurysm follow-up EXAM: CT ANGIOGRAPHY CHEST WITH CONTRAST TECHNIQUE: Multidetector CT imaging of the chest was performed using the standard protocol during bolus administration of intravenous contrast. Multiplanar CT image reconstructions and MIPs were obtained to evaluate the vascular anatomy. RADIATION DOSE REDUCTION: This exam was performed according to the departmental dose-optimization program which includes automated exposure control, adjustment of the mA and/or kV according to patient size and/or use of iterative reconstruction technique. CONTRAST:  172m OMNIPAQUE IOHEXOL 350 MG/ML SOLN COMPARISON:  Previous studies including the examination of 01/18/2020 FINDINGS: Cardiovascular: There is homogeneous enhancement in thoracic aorta. There is fusiform aneurysmal dilation of ascending thoracic aorta measuring 4.4 cm. Coronary artery calcifications are seen.  There are no intraluminal filling defects in central pulmonary artery branches. Mediastinum/Nodes: No significant lymphadenopathy is seen. Lungs/Pleura: Centrilobular emphysema is seen. Small linear densities are seen in mid and lower lung fields. There is no focal consolidation. There is no pleural effusion or pneumothorax. Upper Abdomen: There is fatty infiltration of the liver. Musculoskeletal: No acute findings are seen. Review of the MIP images confirms the above findings. IMPRESSION: There is 4.4 cm aneurysm of the ascending thoracic aorta with no significant interval change. Recommend annual imaging followup by CTA or MRA. This recommendation follows 2010 ACCF/AHA/AATS/ACR/ASA/SCA/SCAI/SIR/STS/SVM Guidelines for the Diagnosis and Management of Patients with Thoracic Aortic Disease. Circulation. 2010; 121:: E423-N361 Aortic aneurysm NOS (ICD10-I71.9) There is no evidence of thoracic aortic dissection. There is no evidence of central pulmonary artery embolism. There is no focal pulmonary consolidation. Coronary artery calcifications are seen.  Fatty liver. Electronically Signed   By: PElmer PickerM.D.   On: 12/26/2021 15:09     Assessment/Plan 1. Aneurysm of ascending aorta without rupture (HCC) Recommend:  No surgery or intervention is indicated at this time.  The patient has an asymptomatic thoracic aortic aneurysm that is less than 6.0 cm in maximal diameter.  I have discussed the natural history of thoracic aortic aneurysm and the small risk of rupture for aneurysm less than 6.5 cm in size.  However, as these small aneurysms tend to enlarge over time, continued surveillance with CT scan is mandatory.   I have also discussed optimizing medical management with hypertension and lipid control and the importance of abstinence from tobacco.  The patient is also encouraged to exercise a minimum of 30 minutes 4 times a week.   Should the patient develop new onset chest or back pain or signs of  peripheral embolization they are instructed to seek medical attention immediately and to alert the physician providing care that they have an aneurysm in the chest.   The patient voices their understanding.  The patient will return as ordered with a CT scan of the chest  - CT CHEST WO CONTRAST; Future  2. 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HCC) Recommend: The patient should continue surveillance of her popliteal artery aneurysms  At the present time her popliteal arteries are less than 25 mm in diameter and therefore repair  is not indicated.  We will continue to monitor with duplex ultrasound. - VAS [place] LOWER EXTREMITY ARTERIAL DUPLEX; Future  3. Aortic atherosclerosis (HCC) Recommend:  I do not find evidence of life style limiting vascular disease. The patient specifically denies life style limitation.  Previous noninvasive studies including ABI's of the legs do not identify critical vascular problems.  The patient should continue walking and begin a more formal exercise program. The patient should continue his antiplatelet therapy and aggressive treatment of the lipid abnormalities.  The patient is instructed to call the office if there is a significant change in the lower extremity symptoms, particularly if a wound develops or there is an abrupt increase in leg pain.   4. Paroxysmal atrial fibrillation (HCC) Continue antiarrhythmia medications as already ordered, these medications have been reviewed and there are no changes at this time.  Continue anticoagulation as ordered by Cardiology Service     Hortencia Pilar, MD  12/31/2021 8:10 AM

## 2022-01-01 ENCOUNTER — Ambulatory Visit (INDEPENDENT_AMBULATORY_CARE_PROVIDER_SITE_OTHER): Payer: Medicare Other | Admitting: Vascular Surgery

## 2022-01-01 ENCOUNTER — Encounter (INDEPENDENT_AMBULATORY_CARE_PROVIDER_SITE_OTHER): Payer: Self-pay | Admitting: Vascular Surgery

## 2022-01-01 VITALS — BP 132/88 | HR 66 | Resp 17

## 2022-01-01 DIAGNOSIS — I480 Paroxysmal atrial fibrillation: Secondary | ICD-10-CM

## 2022-01-01 DIAGNOSIS — I700 Atherosclerosis of aorta: Secondary | ICD-10-CM

## 2022-01-01 DIAGNOSIS — I724 Aneurysm of artery of lower extremity: Secondary | ICD-10-CM

## 2022-01-01 DIAGNOSIS — I7121 Aneurysm of the ascending aorta, without rupture: Secondary | ICD-10-CM

## 2022-01-14 DIAGNOSIS — H26491 Other secondary cataract, right eye: Secondary | ICD-10-CM | POA: Diagnosis not present

## 2022-01-14 DIAGNOSIS — H35371 Puckering of macula, right eye: Secondary | ICD-10-CM | POA: Diagnosis not present

## 2022-02-02 ENCOUNTER — Ambulatory Visit
Admission: RE | Admit: 2022-02-02 | Discharge: 2022-02-02 | Disposition: A | Discharge status: Home / Self Care | Payer: Medicare Other | Source: Ambulatory Visit | Attending: Internal Medicine | Admitting: Internal Medicine

## 2022-02-02 ENCOUNTER — Ambulatory Visit
Admission: RE | Admit: 2022-02-02 | Discharge: 2022-02-02 | Disposition: A | Discharge status: Home / Self Care | Payer: Medicare Other | Source: Ambulatory Visit | Attending: Surgery | Admitting: Surgery

## 2022-02-02 DIAGNOSIS — M85851 Other specified disorders of bone density and structure, right thigh: Secondary | ICD-10-CM | POA: Diagnosis not present

## 2022-02-02 DIAGNOSIS — M85832 Other specified disorders of bone density and structure, left forearm: Secondary | ICD-10-CM | POA: Diagnosis not present

## 2022-02-02 DIAGNOSIS — Z1231 Encounter for screening mammogram for malignant neoplasm of breast: Secondary | ICD-10-CM | POA: Diagnosis not present

## 2022-02-02 DIAGNOSIS — Z780 Asymptomatic menopausal state: Secondary | ICD-10-CM | POA: Diagnosis not present

## 2022-02-09 ENCOUNTER — Encounter: Payer: Self-pay | Admitting: Surgery

## 2022-02-09 ENCOUNTER — Ambulatory Visit (INDEPENDENT_AMBULATORY_CARE_PROVIDER_SITE_OTHER): Payer: Medicare Other | Admitting: Surgery

## 2022-02-09 VITALS — BP 153/86 | HR 66 | Temp 98.3°F | Wt 161.0 lb

## 2022-02-09 DIAGNOSIS — C50412 Malignant neoplasm of upper-outer quadrant of left female breast: Secondary | ICD-10-CM | POA: Diagnosis not present

## 2022-02-09 DIAGNOSIS — Z08 Encounter for follow-up examination after completed treatment for malignant neoplasm: Secondary | ICD-10-CM | POA: Diagnosis not present

## 2022-02-09 DIAGNOSIS — Z170 Estrogen receptor positive status [ER+]: Secondary | ICD-10-CM | POA: Diagnosis not present

## 2022-02-09 NOTE — Patient Instructions (Signed)
If you have any concerns or questions, please feel free to call our office.   Breast Self-Awareness Breast self-awareness is knowing how your breasts look and feel. You need to: Check your breasts on a regular basis. Tell your doctor about any changes. Become familiar with the look and feel of your breasts. This can help you catch a breast problem while it is still small and can be treated. You should do breast self-exams even if you have breast implants. What you need: A mirror. A well-lit room. A pillow or other soft object. How to do a breast self-exam Follow these steps to do a breast self-exam: Look for changes  Take off all the clothes above your waist. Stand in front of a mirror in a room with good lighting. Put your hands down at your sides. Compare your breasts in the mirror. Look for any difference between them, such as: A difference in shape. A difference in size. Wrinkles, dips, and bumps in one breast and not the other. Look at each breast for changes in the skin, such as: Redness. Scaly areas. Skin that has gotten thicker. Dimpling. Open sores (ulcers). Look for changes in your nipples, such as: Fluid coming out of a nipple. Fluid around a nipple. Bleeding. Dimpling. Redness. A nipple that looks pushed in (retracted), or that has changed position. Feel for changes Lie on your back. Feel each breast. To do this: Pick a breast to feel. Place a pillow under the shoulder closest to that breast. Put the arm closest to that breast behind your head. Feel the nipple area of that breast using the hand of your other arm. Feel the area with the pads of your three middle fingers by making small circles with your fingers. Use light, medium, and firm pressure. Continue the overlapping circles, moving downward over the breast. Keep making circles with your fingers. Stop when you feel your ribs. Start making circles with your fingers again, this time going upward until you  reach your collarbone. Then, make circles outward across your breast and into your armpit area. Squeeze your nipple. Check for discharge and lumps. Repeat these steps to check your other breast. Sit or stand in the tub or shower. With soapy water on your skin, feel each breast the same way you did when you were lying down. Write down what you find Writing down what you find can help you remember what to tell your doctor. Write down: What is normal for each breast. Any changes you find in each breast. These include: The kind of changes you find. A tender or painful breast. Any lump you find. Write down its size and where it is. When you last had your monthly period (menstrual cycle). General tips If you are breastfeeding, the best time to check your breasts is after you feed your baby or after you use a breast pump. If you get monthly bleeding, the best time to check your breasts is 5-7 days after your monthly cycle ends. With time, you will become comfortable with the self-exam. You will also start to know if there are changes in your breasts. Contact a doctor if: You see a change in the shape or size of your breasts or nipples. You see a change in the skin of your breast or nipples, such as red or scaly skin. You have fluid coming from your nipples that is not normal. You find a new lump or thick area. You have breast pain. You have any concerns about your   breast health. Summary Breast self-awareness includes looking for changes in your breasts and feeling for changes within your breasts. You should do breast self-awareness in front of a mirror in a well-lit room. If you get monthly periods (menstrual cycles), the best time to check your breasts is 5-7 days after your period ends. Tell your doctor about any changes you see in your breasts. Changes include changes in size, changes on the skin, painful or tender breasts, or fluid from your nipples that is not normal. This information is  not intended to replace advice given to you by your health care provider. Make sure you discuss any questions you have with your health care provider. Document Revised: 04/03/2021 Document Reviewed: 04/03/2021 Elsevier Patient Education  2023 Elsevier Inc.  

## 2022-02-09 NOTE — Progress Notes (Signed)
02/09/2022  History of Present Illness: Connie Mitchell is a 88 y.o. female status post left breast mastectomy with sentinel lymph node biopsy for recurrent left breast cancer in February 2020.  Presents today for yearly follow-up.  Patient had a mammogram done on 02/02/2022 which did not show any suspicious findings.  I personally viewed the images.  Denies any complaints at this point.  Past Medical History: Past Medical History:  Diagnosis Date   A-fib [facility])    Allergy    Anxiety    Cancer ([place]) 1991   left breast. treated with rads and lumpectomy   Cancer ([place]) 2020   newly diagnosed, also left breast   Cataract 2017   Cellulitis and abscess of toe of left foot 01/2018   Dental bridge present    permanent - bottom   GERD (gastroesophageal reflux disease)    Heart murmur 2017   Afib 2022   Hx of therapeutic radiation 1991   left breast   Laryngopharyngeal reflux    sometimes food goes down wrong way and she ends up in extreme coughing/choking scenario   Personal history of radiation therapy 1991   left breast ca   Primary osteoarthritis    Thoracic aortic aneurysm (TAA) ([place])      Past Surgical History: Past Surgical History:  Procedure Laterality Date   BREAST BIOPSY Right 02/22/2020   stereo bx, ribbon clip, path pending    BREAST LUMPECTOMY Left 1991   breast ca, lumpectomy   cataracts Bilateral    COLONOSCOPY WITH PROPOFOL N/A 04/28/2021   Procedure: COLONOSCOPY WITH BIOPSY;  Surgeon: Lucilla Lame, MD;  Location: [place];  Service: Endoscopy;  Laterality: N/A;   CYSTOCELE REPAIR  2010   EYE SURGERY Bilateral 2017   cataracts extracted   JOINT REPLACEMENT     MASTECTOMY Left 07/25/2018   Invasive Mammary Carcinoma   MASTECTOMY W/ SENTINEL NODE BIOPSY Left 07/25/2018   Procedure: MASTECTOMY WITH SENTINEL LYMPH NODE BIOPSY;  Surgeon: Olean Ree, MD;  Location: [facility];  Service: General;  Laterality: Left;   OOPHORECTOMY  1995   ovarian mass -  benign   POLYPECTOMY N/A 04/28/2021   Procedure: POLYPECTOMY;  Surgeon: Lucilla Lame, MD;  Location: [place];  Service: Endoscopy;  Laterality: N/A;   REPLACEMENT TOTAL KNEE Bilateral 2012,2013   SKIN BIOPSY Left    arm growth   TONSILLECTOMY  1958   TOTAL SHOULDER ARTHROPLASTY Right 2016    Home Medications: Prior to Admission medications   Medication Sig Start Date End Date Taking? Authorizing Provider  Calcium Carbonate-Vitamin D (CALCIUM-VITAMIN D) 600-125 MG-UNIT TABS Take 1 tablet by mouth 2 (two) times a day.    Yes [provider]  Multiple Vitamins-Minerals ([place] 50+) CAPS    Yes [provider]  omeprazole (PRILOSEC) 40 MG capsule TAKE 1 CAPSULE BY MOUTH EVERY DAY 12/24/21  Yes Glean Hess, MD  XARELTO 20 MG TABS tablet Take 20 mg by mouth at bedtime. 06/17/20  Yes [provider]    Allergies: Allergies  Allergen Reactions   Advair Hfa [Fluticasone-Salmeterol] Other (See Comments)    Thrush, flushing and headache   Ciprofloxacin Nausea And Vomiting    Review of Systems: Review of Systems  Constitutional:  Negative for chills and fever.  Respiratory:  Negative for shortness of breath.   Cardiovascular:  Negative for chest pain.  Gastrointestinal:  Negative for abdominal pain, nausea and vomiting.  Skin:  Negative for rash.    Physical  Exam BP (!) 153/86   Pulse 66   Temp 98.3 F (36.8 C) (Oral)   Wt 161 lb (73 kg)   SpO2 97%   BMI 28.52 kg/m  CONSTITUTIONAL: No acute distress HEENT:  Normocephalic, atraumatic, extraocular motion intact. RESPIRATORY:  Normal respiratory effort without pathologic use of accessory muscles. CARDIOVASCULAR: Regular rhythm and rate BREAST: Left breast status postmastectomy with scar well-healed.  There is a stable nodule that is at the midportion of the scar measuring about 1 cm in size which is soft, nontender.  No left axillary lymphadenopathy.  Right breast without any palpable  masses, skin changes, or nipple changes.  No right axillary lymphadenopathy. NEUROLOGIC:  Motor and sensation is grossly normal.  Cranial nerves are grossly intact. PSYCH:  Alert and oriented to person, place and time. Affect is normal.  Labs/Imaging: Mammogram on 02/02/2022: FINDINGS: There are no findings suspicious for malignancy.   IMPRESSION: No mammographic evidence of malignancy. A result letter of this screening mammogram will be mailed directly to the patient.   RECOMMENDATION: Screening mammogram in one year. (Code:SM-B-01Y)   BI-RADS CATEGORY  1: Negative.  Assessment and Plan: This is a 88 y.o. female status post left breast ectomy and sentinel lymph node biopsy.  - Patient is doing well any complaints or new findings.  Mammogram does not show any suspicious findings and physical exam today is very reassuring. - Patient to follow-up with me again in 1 year with repeat mammogram.  I spent 20 minutes dedicated to the care of this patient on the date of this encounter to include pre-visit review of records, face-to-face time with the patient discussing diagnosis and management, and any post-visit coordination of care.   Melvyn Neth, [facility]

## 2022-02-17 DIAGNOSIS — L578 Other skin changes due to chronic exposure to nonionizing radiation: Secondary | ICD-10-CM | POA: Diagnosis not present

## 2022-02-17 DIAGNOSIS — Z872 Personal history of diseases of the skin and subcutaneous tissue: Secondary | ICD-10-CM | POA: Diagnosis not present

## 2022-02-17 DIAGNOSIS — D2271 Melanocytic nevi of right lower limb, including hip: Secondary | ICD-10-CM | POA: Diagnosis not present

## 2022-02-17 DIAGNOSIS — D485 Neoplasm of uncertain behavior of skin: Secondary | ICD-10-CM | POA: Diagnosis not present

## 2022-02-17 DIAGNOSIS — Z85828 Personal history of other malignant neoplasm of skin: Secondary | ICD-10-CM | POA: Diagnosis not present

## 2022-02-23 ENCOUNTER — Other Ambulatory Visit: Payer: Self-pay | Admitting: Internal Medicine

## 2022-02-24 NOTE — Telephone Encounter (Signed)
Refilled 12/24/2021#90 0 refills. Requested Prescriptions  Pending Prescriptions Disp Refills  . omeprazole (PRILOSEC) 40 MG capsule [Pharmacy Med Name: OMEPRAZOLE DR 40 MG CAPSULE] 90 capsule 0    Sig: TAKE 1 CAPSULE BY MOUTH EVERY DAY     Gastroenterology: Proton Pump Inhibitors Passed - 02/23/2022  9:40 AM      Passed - Valid encounter within last 12 months    Recent Outpatient Visits          2 months ago Paroxysmal atrial fibrillation [facility])   [place] [facility] at [facility], Jesse Sans, MD   1 year ago Diverticulitis of large intestine without perforation or abscess without bleeding   [place] [facility] at [facility], Jesse Sans, MD   1 year ago Paroxysmal A-fib [facility])   [place] [facility] at [facility], Jesse Sans, MD   1 year ago Enteritis   [place] [facility] Medicine at [facility], Jesse Sans, MD   1 year ago Palpitations   [place] [facility] at [facility], Jesse Sans, MD      Future Appointments            In 10 months Army Melia Jesse Sans, MD [place] [facility] Medicine at [facility], [facility]

## 2022-03-27 ENCOUNTER — Encounter: Payer: Self-pay | Admitting: Internal Medicine

## 2022-03-27 ENCOUNTER — Ambulatory Visit (INDEPENDENT_AMBULATORY_CARE_PROVIDER_SITE_OTHER): Payer: Medicare Other | Admitting: Internal Medicine

## 2022-03-27 VITALS — BP 110/62 | HR 88 | Ht 63.0 in | Wt 159.0 lb

## 2022-03-27 DIAGNOSIS — M26621 Arthralgia of right temporomandibular joint: Secondary | ICD-10-CM | POA: Insufficient documentation

## 2022-03-27 MED ORDER — BACLOFEN 10 MG PO TABS: 10.0000 mg | ORAL_TABLET | Freq: Three times a day (TID) | ORAL | 0 refills | Status: DC

## 2022-03-27 NOTE — Patient Instructions (Signed)
Tylenol 2-3 times per day until symptoms improve  Warm compresses  Baclofen at bedtime.

## 2022-03-27 NOTE — Progress Notes (Signed)
Date:  03/27/2022   Name:  Connie Mitchell   DOB:  [birth year]   MRN:  [id number]   Chief Complaint: Facial Pain (X1 month. Hurts at the end of jaw close to her ear. This morning it was hurting while eating, and she pushed her ear and the pain was at a 10. )  HPI  Facial Pain: This is a new problem The problem started 1 month ago. Hurts to push on her right ear and right jaw is tender. No drainage.   Lab Results  Component Value Date   NA 141 12/19/2021   K 4.3 12/19/2021   CO2 22 12/19/2021   GLUCOSE 82 12/19/2021   BUN 17 12/19/2021   CREATININE 0.72 12/19/2021   CALCIUM 9.4 12/19/2021   EGFR 81 12/19/2021   GFRNONAA >60 10/08/2020   Lab Results  Component Value Date   CHOL 179 12/19/2021   HDL 55 12/19/2021   LDLCALC 100 (H) 12/19/2021   TRIG 139 12/19/2021   CHOLHDL 3.3 12/19/2021   Lab Results  Component Value Date   TSH 5.470 (H) 12/19/2021   No results found for: "HGBA1C" Lab Results  Component Value Date   WBC 6.3 12/19/2021   HGB 14.3 12/19/2021   HCT 43.5 12/19/2021   MCV 95 12/19/2021   PLT CANCELED 12/19/2021   Lab Results  Component Value Date   ALT 15 12/19/2021   AST 17 12/19/2021   ALKPHOS 68 12/19/2021   BILITOT 0.5 12/19/2021   Lab Results  Component Value Date   VD25OH 42.6 12/19/2021     Review of Systems  Constitutional:  Negative for chills, fatigue and fever.  HENT:  Positive for postnasal drip. Negative for ear discharge, ear pain, sinus pressure, sore throat and trouble swallowing.   Respiratory:  Negative for chest tightness.   Cardiovascular:  Negative for chest pain.  Musculoskeletal:  Positive for arthralgias (pain on right when sleeping on that side and with eating hard foods).    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/30/2018   Osteopenia of neck of right femur 07/12/2018   Thoracic ascending aortic aneurysm ([place]) 05/27/2018   LPRD (laryngopharyngeal reflux disease) 03/17/2018   Chronic foot pain 11/25/2017   Lumbar degenerative disc disease 09/24/2017   Hx of Lyme disease 09/24/2017   Hx of basal cell carcinoma 09/24/2017   Nuclear sclerotic cataract of left eye 08/09/2015   Osteoporosis 07/04/2014   Primary localized osteoarthrosis of shoulder region 12/13/2012   Osteoarthritis 12/18/2011    Allergies  Allergen Reactions   Advair Hfa [Fluticasone-Salmeterol] Other (See Comments)    Thrush, flushing and headache   Ciprofloxacin Nausea And Vomiting    Past Surgical History:  Procedure Laterality Date   BREAST BIOPSY Right 02/22/2020   stereo bx, ribbon clip, path pending    BREAST LUMPECTOMY Left 1991   breast ca, lumpectomy   cataracts Bilateral    COLONOSCOPY WITH PROPOFOL N/A 04/28/2021   Procedure: COLONOSCOPY WITH BIOPSY;  Surgeon: Lucilla Lame, MD;  Location: [place];  Service: Endoscopy;  Laterality: N/A;   CYSTOCELE REPAIR  2010   EYE SURGERY Bilateral 2017  cataracts extracted   JOINT REPLACEMENT     MASTECTOMY Left 07/25/2018   Invasive Mammary Carcinoma   MASTECTOMY W/ SENTINEL NODE BIOPSY Left 07/25/2018   Procedure: MASTECTOMY WITH SENTINEL LYMPH NODE BIOPSY;  Surgeon: Olean Ree, MD;  Location: [facility];  Service: General;  Laterality: Left;   OOPHORECTOMY  1995   ovarian mass - benign   POLYPECTOMY N/A 04/28/2021   Procedure: POLYPECTOMY;  Surgeon: Lucilla Lame, MD;  Location: [place];  Service: Endoscopy;  Laterality: N/A;   REPLACEMENT TOTAL KNEE Bilateral 2012,2013   SKIN BIOPSY Left    arm growth   TONSILLECTOMY  1958   TOTAL SHOULDER ARTHROPLASTY Right 2016    Social  History   Tobacco Use   Smoking status: Never   Smokeless tobacco: Never   Tobacco comments:    smoking cessation materials not required  Vaping Use   Vaping Use: Never used  Substance Use Topics   Alcohol use: Yes    Comment: occasional beer or wine   Drug use: Never     Medication list has been reviewed and updated.  Current Meds  Medication Sig   Calcium Carbonate-Vitamin D (CALCIUM-VITAMIN D) 600-125 MG-UNIT TABS Take 1 tablet by mouth 2 (two) times a day.    Multiple Vitamins-Minerals ([place] ADULT 50+) CAPS    omeprazole (PRILOSEC) 40 MG capsule TAKE 1 CAPSULE BY MOUTH EVERY DAY   XARELTO 20 MG TABS tablet Take 20 mg by mouth at bedtime.       03/27/2022   10:36 AM 12/19/2021    8:48 AM 02/05/2021   10:16 AM 12/17/2020    8:47 AM  GAD 7 : Generalized Anxiety Score  Nervous, Anxious, on Edge 0 0 0 0  Control/stop worrying 0 0 0 0  Worry too much - different things 0 0 0 0  Trouble relaxing 0 0 0 0  Restless 0 0 0 0  Easily annoyed or irritable 0 1 0 0  Afraid - awful might happen 0 0 0 0  Total GAD 7 Score 0 1 0 0  Anxiety Difficulty Not difficult at all Not difficult at all Not difficult at all Not difficult at all       03/27/2022   10:36 AM 12/19/2021    8:48 AM 12/01/2021   10:41 AM  Depression screen PHQ 2/9  Decreased Interest 0 0 0  Down, Depressed, Hopeless 0 0 0  PHQ - 2 Score 0 0 0  Altered sleeping 0 1   Tired, decreased energy 0 1   Change in appetite 0 0   Feeling bad or failure about yourself  0 0   Trouble concentrating 0 0   Moving slowly or fidgety/restless 0 0   Suicidal thoughts 0 0   PHQ-9 Score 0 2   Difficult doing work/chores Not difficult at all Not difficult at all     BP Readings from Last 3 Encounters:  03/27/22 110/62  02/09/22 (!) 153/86  01/01/22 132/88    Physical Exam Constitutional:      Appearance: Normal appearance.  HENT:     Head:     Jaw: Tenderness (on the right) and pain on movement present. No  malocclusion.     Salivary Glands: Right salivary gland is not diffusely enlarged or tender. Left salivary gland is not diffusely enlarged or tender.     Right Ear: No tenderness. No middle ear effusion. Tympanic membrane is retracted. Tympanic membrane is not erythematous.  Left Ear: No tenderness.  No middle ear effusion. Tympanic membrane is not erythematous or retracted.     Nose:     Right Sinus: No maxillary sinus tenderness or frontal sinus tenderness.     Left Sinus: No maxillary sinus tenderness or frontal sinus tenderness.  Cardiovascular:     Rate and Rhythm: Normal rate and regular rhythm.  Pulmonary:     Effort: Pulmonary effort is normal. No respiratory distress.     Breath sounds: Normal breath sounds.  Musculoskeletal:     Cervical back: Normal range of motion.  Lymphadenopathy:     Cervical: No cervical adenopathy.  Neurological:     Mental Status: She is alert.     Wt Readings from Last 3 Encounters:  03/27/22 159 lb (72.1 kg)  02/09/22 161 lb (73 kg)  12/19/21 167 lb (75.8 kg)    BP 110/62   Pulse 88   Ht '5\' 3"'  (1.6 m)   Wt 159 lb (72.1 kg)   BMI 28.17 kg/m   Assessment and Plan: 1. Arthralgia of right temporomandibular joint Avoid hard, chewy or sticky foods Warm compresses with Tylenol tid Start Baclofen at bedtime. - baclofen (LIORESAL) 10 MG tablet; Take 1 tablet (10 mg total) by mouth 3 (three) times daily.  Dispense: 30 each; Refill: 0   Partially dictated using Editor, commissioning. Any errors are unintentional.  Halina Maidens, MD [facility]  03/27/2022

## 2022-04-01 DIAGNOSIS — D2371 Other benign neoplasm of skin of right lower limb, including hip: Secondary | ICD-10-CM | POA: Diagnosis not present

## 2022-04-01 DIAGNOSIS — L988 Other specified disorders of the skin and subcutaneous tissue: Secondary | ICD-10-CM | POA: Diagnosis not present

## 2022-04-07 ENCOUNTER — Other Ambulatory Visit: Payer: Self-pay | Admitting: Internal Medicine

## 2022-04-07 DIAGNOSIS — I7121 Aneurysm of the ascending aorta, without rupture: Secondary | ICD-10-CM | POA: Diagnosis not present

## 2022-04-07 DIAGNOSIS — I700 Atherosclerosis of aorta: Secondary | ICD-10-CM | POA: Diagnosis not present

## 2022-04-07 DIAGNOSIS — I4891 Unspecified atrial fibrillation: Secondary | ICD-10-CM | POA: Diagnosis not present

## 2022-04-07 DIAGNOSIS — I4892 Unspecified atrial flutter: Secondary | ICD-10-CM | POA: Diagnosis not present

## 2022-04-08 NOTE — Telephone Encounter (Signed)
Requested Prescriptions  Pending Prescriptions Disp Refills  . omeprazole (PRILOSEC) 40 MG capsule [Pharmacy Med Name: OMEPRAZOLE DR 40 MG CAPSULE] 90 capsule 2    Sig: TAKE 1 CAPSULE BY MOUTH EVERY DAY     Gastroenterology: Proton Pump Inhibitors Passed - 04/07/2022  7:20 AM      Passed - Valid encounter within last 12 months    Recent Outpatient Visits          1 week ago Arthralgia of right temporomandibular joint   [place] [facility] at [facility], Jesse Sans, MD   3 months ago Paroxysmal atrial fibrillation [facility])   [place] [facility] at [facility], Jesse Sans, MD   1 year ago Diverticulitis of large intestine without perforation or abscess without bleeding   [place] [facility] at [facility], Jesse Sans, MD   1 year ago Paroxysmal A-fib [facility])   [place] [facility], Jesse Sans, MD   1 year ago Enteritis   [facility] Medicine at [facility], Jesse Sans, MD      Future Appointments            In 8 months Army Melia, Jesse Sans, MD [place] [facility] Medicine at [facility], [facility]

## 2022-04-13 ENCOUNTER — Encounter (INDEPENDENT_AMBULATORY_CARE_PROVIDER_SITE_OTHER): Payer: Self-pay

## 2022-05-11 ENCOUNTER — Telehealth: Payer: Self-pay | Admitting: Internal Medicine

## 2022-05-11 ENCOUNTER — Other Ambulatory Visit: Payer: Self-pay

## 2022-05-11 DIAGNOSIS — M26621 Arthralgia of right temporomandibular joint: Secondary | ICD-10-CM

## 2022-05-11 NOTE — Telephone Encounter (Signed)
Copied from [place]. Topic: Referral - Request for Referral >> May 11, 2022  3:24 PM Chapman Fitch wrote: Has patient seen PCP for this complaint? Yes *If NO, is insurance requiring patient see PCP for this issue before PCP can refer them? Referral for which specialty: ENT Preferred provider/office: Dr. Margaretha Sheffield Reason for referral: ear issue/ pt asked if she needs the referral or if Dr. Army Melia can schedule this appt/ please advise

## 2022-05-11 NOTE — Telephone Encounter (Signed)
Referral placed for TMJ.  CM

## 2022-06-01 ENCOUNTER — Other Ambulatory Visit: Payer: Self-pay | Admitting: Internal Medicine

## 2022-06-01 DIAGNOSIS — L03115 Cellulitis of right lower limb: Secondary | ICD-10-CM | POA: Diagnosis not present

## 2022-06-01 DIAGNOSIS — M26621 Arthralgia of right temporomandibular joint: Secondary | ICD-10-CM

## 2022-06-23 ENCOUNTER — Ambulatory Visit: Payer: Medicare Other | Attending: Internal Medicine

## 2022-06-23 DIAGNOSIS — M542 Cervicalgia: Secondary | ICD-10-CM

## 2022-06-23 DIAGNOSIS — M26621 Arthralgia of right temporomandibular joint: Secondary | ICD-10-CM | POA: Diagnosis not present

## 2022-06-23 NOTE — Therapy (Signed)
OUTPATIENT PHYSICAL THERAPY TMJ EVALUATION   Patient Name: Connie Mitchell MRN: [id number] DOB:[birth year], 89 y.o., female Today's Date: 06/23/2022   PT End of Session - 06/23/22 0757     Visit Number 1    Number of Visits 17    Date for PT Re-Evaluation 08/18/22    Authorization Type eval: 06/23/22    PT Start Time 0800    PT Stop Time 0845    PT Time Calculation (min) 45 min    Activity Tolerance Patient tolerated treatment well    Behavior During Therapy [facility] for tasks assessed/performed            Past Medical History:  Diagnosis Date   A-fib ([place])    Allergy    Anxiety    Cancer ([employer]) 1991   left breast. treated with rads and lumpectomy   Cancer ([place]) 2020   newly diagnosed, also left breast   Cataract 2017   Cellulitis and abscess of toe of left foot 01/2018   Dental bridge present    permanent - bottom   GERD (gastroesophageal reflux disease)    Heart murmur 2017   Afib 2022   Hx of therapeutic radiation 1991   left breast   Laryngopharyngeal reflux    sometimes food goes down wrong way and she ends up in extreme coughing/choking scenario   Personal history of radiation therapy 1991   left breast ca   Primary osteoarthritis    Thoracic aortic aneurysm (TAA) ([place])    Past Surgical History:  Procedure Laterality Date   BREAST BIOPSY Right 02/22/2020   stereo bx, ribbon clip, path pending    BREAST LUMPECTOMY Left 1991   breast ca, lumpectomy   cataracts Bilateral    COLONOSCOPY WITH PROPOFOL N/A 04/28/2021   Procedure: COLONOSCOPY WITH BIOPSY;  Surgeon: Lucilla Lame, MD;  Location: [place];  Service: Endoscopy;  Laterality: N/A;   CYSTOCELE REPAIR  2010   EYE SURGERY Bilateral 2017   cataracts extracted   JOINT REPLACEMENT     MASTECTOMY Left 07/25/2018   Invasive Mammary Carcinoma   MASTECTOMY W/ SENTINEL NODE BIOPSY Left 07/25/2018   Procedure: MASTECTOMY WITH SENTINEL LYMPH NODE BIOPSY;  Surgeon: Olean Ree, MD;  Location: [facility];   Service: General;  Laterality: Left;   OOPHORECTOMY  1995   ovarian mass - benign   POLYPECTOMY N/A 04/28/2021   Procedure: POLYPECTOMY;  Surgeon: Lucilla Lame, MD;  Location: [place];  Service: Endoscopy;  Laterality: N/A;   REPLACEMENT TOTAL KNEE Bilateral 2012,2013   SKIN BIOPSY Left    arm growth   TONSILLECTOMY  1958   TOTAL SHOULDER ARTHROPLASTY Right 2016   Patient Active Problem List   Diagnosis Date Noted   Arthralgia of right temporomandibular joint 03/27/2022   Acquired thrombophilia ([place]) 12/19/2021   Abnormal CT of the abdomen 10/01/2021   Polyp of ascending colon 10/01/2021   Chronic cough 08/13/2021   Enteritis 07/14/2020   Rectal bleed 07/14/2020   Paroxysmal atrial fibrillation ([place]) 07/14/2020   Aortic atherosclerosis ([place]) 12/12/2019   Axillary adenopathy 01/01/2019   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[place]) 12/30/2018   Osteopenia of neck of right femur 07/12/2018   Thoracic ascending aortic aneurysm ([place]) 05/27/2018   LPRD (laryngopharyngeal reflux disease) 03/17/2018   Chronic foot pain 11/25/2017   Lumbar degenerative disc disease 09/24/2017   Hx of Lyme disease 09/24/2017   Hx of basal cell carcinoma 09/24/2017   Nuclear sclerotic cataract of left eye 08/09/2015   Osteoporosis 07/04/2014  Primary localized osteoarthrosis of shoulder region 12/13/2012   Osteoarthritis 12/18/2011    PCP: Glean Hess, MD  REFERRING PROVIDER: Glean Hess, MD  REFERRING DIAGNOSIS: [phone] (ICD-10-CM) - Arthralgia of right temporomandibular joint   THERAPY DIAG: Cervicalgia  RATIONALE FOR EVALUATION AND TREATMENT: Rehabilitation  ONSET DATE: September 2023  FOLLOW UP APPT WITH PROVIDER: Yes    SUBJECTIVE:                                                                                                                                                                                         Chief Complaint: R TMJ pain  Pertinent History Pt arrives  reporting R sided facial pain since September 2023. No specific trauma at the time of onset. She describes the pain as a "really bad ear ache" localized over her R TMJ, R masseter, and near the angle of the R side of her mandible. She saw Dr. Army Melia and was referred for physical therapy as it was determined that the pain was originating in her R TMJ. She was also referred to ENT but was told by their office that they couldn't see her for this issue. She also went to see [facility] approximately 3 weeks ago and reports she had a clear dental exam with normal imaging. She reports that she is missing an upper and lower tooth on the L side and has a deteriorating bridge in the R lower side.  It was recommended the she see oral surgery for her R TMJ pain but she would prefer to try physical therapy first. The pain is most notable when she rotates her head to the right when sleeping and especially when she swallows with a closed mouth.  It hurts to palpate over her R TMJ. She denies any drainage from either ear and complains of difficulty swallowing however this is a chronic issue. She has a history of previous esophageal dilation however she continues to experience occasional "gagging fits." She had a swallow study and states that she was initially told that the study was normal however her pulmonologist reports some aspiration. History of L sided breast cancer and she sees oconologist this August for the last visit and then will have to follow-up every 5 years. Pt reports that she was told to avoid hard foods however this is not painful for her. She tried both warm compresses and Tylenol without any improvement. She tried to take the Baclofen however it made her feel too drowsy so she did not continue to take. Pt complains of frequent dry mouth as well as itching in her ears. No history of OSA and does not wear  CPAP.  Last Dental Examination and imaging: [facility] approximately 3 weeks ago.  Recent dental  procedures or orthodontics: No Pain location: Worse is in the R TMJ but pt also gets pain in across the zygomatic process and under the angle of the right mandible Pain Severity: Present: 1/10, Best: 1/10, Worst: 7/10 Pain quality: throbbing ache in jaw but sharp pain in ear  Radiating pain: Radiates around the R side of her face Numbness/Tingling: No Aggravating factors: Swallowing with a closed mouth, rotating the head to the right when sleeping Easing factors: sleeping, no help with self-massage, no help with Tylenol, tried Baclofen but didn't like how it made her feel (drowsy) Clicking, catching, or crepitus during chewing: No 24 hour pain behavior: Sometimes worse when first waking up but not consistently History of grinding teeth: Yes Recent or remote jaw/face/neck trauma, injury, or pain: No Falls: Has patient fallen in last 6 months? No, History of prior physical therapy for this issue: No  Imaging: Yes, see history Progression (improving, worsening, unchanged): gradual worsening but today is a good day History of headaches/migraines: Yes, history of chronic headaches (occur frequently), never formally diagnosed with migraines. Headaches multiple times per week (frontal region around temples). First bout of floaters in August after laser eye surgery but not currently. Photophobia with headaches but no phonophobia or sensitivity to smell. No nausea/vomiting with headaches Ear symptoms (tinnitus, fullness, pain): Yes, "insect noises" in both ears. Intermittent bilateral ear pain but mostly on the right side. Frequent ear itching. Pt reports that she "needs hearing aides" but doesn't want to get them until she gets the itching resolved; Chest pain: Yes, intermittent L sided chest pain occasionally.  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 Denies chills/fever, night sweats, nausea, vomiting, unexplained weight gain/loss;  Precautions: None  Weight Bearing Restrictions: No  Living Environment Lives with:  lives with business partner Lives in: House/apartment, single story, 4 steps to enter/exit, no rails. Uses rollator on steps and holds flag post. Equipment: Rollator (2 different ones, one low and one upright), BSC, walk-in shower with bench, no grab bars  Patient Goals: Decrease pain.    OBJECTIVE  Patient Surveys  NDI To be completed FOTO 50, predicted improvement to 43  Mental Status Patient is oriented to person, place and time.  Recent memory is intact.  Remote memory is intact.  Attention span and concentration are intact.  Expressive speech is intact.  Patient's fund of knowledge is within normal limits for educational level.  Cranial Nerves Visual acuity and visual fields are intact  Extraocular muscles are intact  Facial sensation is intact bilaterally  Facial strength is intact bilaterally  Hearing is normal as tested by gross conversation however pt reports some loss of hearing Palate elevates midline, normal phonation  Shoulder shrug strength is intact  Tongue protrudes midline   MUSCULOSKELETAL: Tremor: None Bulk: Normal Tone: Normal Facial Symmetry: Face appears grossly symmetrical  Posture Moderate to severe upper thoracic kyphosis with forward head/neck posture  Cervical Screen Centralization: Deferred Isometrics: Full and painless in all directions Passive Accessory Intervertebral Motion (PAIVM), central and unilateral (bilaterally): Deferred Spurlings A (  ipsilateral lateral flexion/axial compression): R: Not examined L: Not  examined Spurlings B (ipsilateral lateral flexion/contralateral rotation/axial compression): R: Not examined L: Not examined Cervical Distraction Test: Not examined  Cervical Fexion-Rotation Test: Not examined  Hoffman Sign (cervical cord compression): R: Not examined L: Not examined  AROM AROM (Normal range in degrees) AROM  Cervical  Flexion (50) 50* (L upper trap pain)  Extension (80) 30  Right lateral flexion (45) 18* (L sided neck pain)  Left lateral flexion (45) 18* (R sided neck pain)  Right rotation (85)   Left rotation (85)   (* = pain; Blank rows = not tested)  Dermatome/Myotome Screen N=normal  Ab=abnormal Level Dermatome R L Myotome R L Reflex R L  C2 Posterior Scalp N N Cervical Flexion/Extension C1-2 N N Jaw CN V    C3 Anterior Neck N N Cervical Sidebend C2-3 N N Biceps C5-6    C4 Top of Shoulder N N Shoulder Shrug C4 N N Brachiorad. C5-6    C5 Lateral Upper Arm N N Shoulder ABD C4-5 N N Triceps C7    C6 Lateral Arm/ Thumb N N Arm Flex/ Wrist Ext C5-6 N N     C7 Middle Finger N N Arm Ext//Wrist Flex C6-7 N N     C8 4th & 5th Finger N N Flex/ Ext Carpi Ulnaris C8 N N     T1 Medial Arm N N Interossei T1 N N      Sensation Grossly intact to light touch bilateral face and UE as determined by testing branches of trigeminal nerve and dermatomes C2-T2  Palpation Graded on 0-4 scale (0 = no pain, 1 = pain, 2 = pain with wincing/grimacing/flinching, 3 = pain with withdrawal, 4 = unwilling to allow palpation) Location LEFT  RIGHT           Temporomandibular Joint (posterior, superior, anterior)  1  Temporalis (anterior, middle, posterior fibers)    Temporalis Tendon Insertion  1  Masseter (Zygoma, Body, Lateral surface of angle of mandible)  1  Medial ptyergoid  1  Frontal Sinus    Maxillary Sinus    SCM    Upper Trapezius  1  Subocciptials  1   Mandibular AROM Resting Dental Alignment/Occlusion (Overbite, underbite, overjet, crossbite):  Mandible Depression  (40-11m):  Mandible Protrusion (3-627m:  Mandible Lateral Excursion (10-1234m R: L:  Audible joint sounds (crepitus or clicking with stethoscope with opening, lateral deviation, and bite): Positive Reciprocal Click (palpation, click with both opening/closing): Positive Deviation of Mouth During Opening: Positive, "S" curve noted during both opening and closing; Deflection of Mouth at End Range: Negative  Strength R/L Functional Mandible Depression (Jaw Opening): Functional Mandible Protrusion: Functional Mandible Elevation (Jaw Closing) Functional/Functional Mandible Lateral Deviation  Passive Accessory Joint Motion (PAIVM) Inferior (Caudal Glide):  Anterior Glide:  Medial Glide:  Lateral Glide: Medial and Lateral Extra-oral Glide:   Special Tests Biting on one separator (ipsilateral muscle activation/contralateral joint compression): R: Not examined L: Not examined Biting on two separators (bilateral muscle activation): R: Not examined L: Not examined Manual Joint Compression: Not examined Manual Joint Distraction: Not examined  Beighton scale: Deferred  MMT  MMT (out of 5) Right 06/23/2022 Left 06/23/2022  Cervical (isometric)  Flexion WNL  Extension WNL  Lateral Flexion WNL WNL  Rotation WNL WNL      Shoulder   Flexion 4+ 4+  Extension    Abduction 4+ 4+  Internal rotation    Horizontal abduction    Horizontal adduction  Lower Trapezius    Rhomboids        Elbow  Flexion 5 5  Extension 5 5  Pronation    Supination        Wrist  Flexion 5 5  Extension 5 5  Radial deviation    Ulnar deviation        MCP  Flexion 5 5  Extension 5 5  Abduction 5 5  Adduction 5 5  (* = pain; Blank rows = not tested)  Reflexes Deferred  TODAY'S TREATMENT  Deferred   PATIENT EDUCATION:  Education details: Plan of care Person educated: Patient Education method: Explanation Education comprehension: verbalized understanding   HOME EXERCISE PROGRAM: None  currently   ASSESSMENT:  CLINICAL IMPRESSION: Patient is a 89 y.o. female who was seen today for physical therapy evaluation and treatment for R TMJ pain. Objective impairments include decreased strength, postural dysfunction, and pain. These impairments are limiting patient from  eating . Personal factors including Age, Past/current experiences, Time since onset of injury/illness/exacerbation, and 3+ comorbidities: breast CA, afib, and anxiety  are also affecting patient's functional outcome. Patient will benefit from skilled PT to address above impairments and improve overall function.  REHAB POTENTIAL: Good  CLINICAL DECISION MAKING: Unstable/unpredictable  EVALUATION COMPLEXITY: High   GOALS: Goals reviewed with patient? Yes  SHORT TERM GOALS: Target date: 07/21/2022  Pt will be independent with HEP to improve strength and decrease R TMJ pain in order to improve pain-free function at home. Baseline:  Goal status: INITIAL   LONG TERM GOALS: Target date: 08/18/2022  Pt will increase FOTO to at least 54 to demonstrate significant improvement in function at home and work related to neck pain  Baseline: 06/23/22: 50; Goal status: INITIAL  2.  Pt will decrease worst TMJ pain by at least 3 points on the NPRS in order to demonstrate clinically significant reduction in TMJ pain. Baseline: 06/23/22: worst: 7/10; Goal status: INITIAL  3.  Pt will decrease NDI score by at least 19% in order demonstrate clinically significant reduction in neck pain/disability.       Baseline: 06/23/22: To be completed Goal status: INITIAL   PLAN: PT FREQUENCY: 2x/week  PT DURATION: 8 weeks  PLANNED INTERVENTIONS: Therapeutic exercises, Therapeutic activity, Neuromuscular re-education, Balance training, Gait training, Patient/Family education, Joint manipulation, Joint mobilization, Vestibular training, Canalith repositioning, Dry Needling, Electrical stimulation, Spinal manipulation, Spinal mobilization,  Cryotherapy, Moist heat, Taping, Traction, Ultrasound, Ionotophoresis '4mg'$ /ml Dexamethasone, and Manual therapy  PLAN FOR NEXT SESSION: Complete NDI, TMJ and cervical PAM, mandibular ROM and strength measurements, initiate strengthening and manual therapy, issue HEP;  Lyndel Safe Ashtyn Freilich PT, DPT, GCS  Deidrick Rainey 06/23/2022, 3:24 PM

## 2022-06-25 ENCOUNTER — Ambulatory Visit: Payer: Medicare Other

## 2022-06-25 DIAGNOSIS — M26621 Arthralgia of right temporomandibular joint: Secondary | ICD-10-CM | POA: Diagnosis not present

## 2022-06-25 DIAGNOSIS — M542 Cervicalgia: Secondary | ICD-10-CM

## 2022-06-25 NOTE — Patient Instructions (Signed)
Medication: Medications that can be used to treat TMD are antiinflammatories (e.g. ibuprofen, aleve, meloxicam), muscle relaxants (e.g. cyclobenzaprine, tizanidine, metaxalone), antidepressants (e.g. amitriptyline, nortriptyline, duloxetine), anticonvulsants (e.g. gabapentin, pregabalin), beta blockers (e.g. propanolol). Topical medications can also help with the treatment of TMD (e.g. diclofenac 1% gel, compounded medications).   Medications that can be used to treat neuropathic pain are usually antidepressants (e.g. amitriptyline, nortriptyline, duloxetine), anticonvulsants (e.g. gabapentin, pregabalin, clonazepam). Topical medications can also help with the treatment of neuropathic pain (e.g. lidocaine, compounded medications). Alpha lipoic acid 600 mg a day.  Patient can apply a pea size amount of Diclofenac 1% (Voltaren Gel) 2 to 4 times a day in the area of pain, on clean skin.  Ice, heat, or massage can sooth your muscles and reduce joint inflammation. Use any of these 3 modalities, depending on which one or which combination works best for your pain. For example, some patients use ice in the day and heat at night.  Ice Use an ice-pack or simply wrap some ice cubes in a thin towel. Apply to the painful region on your face for 1-2 minutes, then remove it for 1-2 minutes. Repeat this for 15-20 minutes, 3-6 times a day  Heat Use a heating pad or simply dip a towel in hot water and then wring it dry. Apply to the painful region on your face for 1-2 minutes, then remove it for 1-2 minutes. Repeat this for 15-20 minutes, 3-6 times a day.  Massage Gently massage your facial muscles with your finger tip. Apply pressure to the painful muscles in a manner that does not increase your pain. Repeat this for 15-20 minutes, 4-6 times a day.  Eat a PAIN-FREE diet Avoid hard foods, such as [place] bread or bagels. Avoid chewy foods, such as steak or candy. Cut fruits into small pieces and steam  vegetables. Chew with your back teeth rather than biting with your front teeth. Eat anything you want as long as it does not cause pain or locking in your jaw. Do not stay on a soft diet to long and periodically increase the hardness/chewiness of your diet as tolerated.  Chew your food on both sides at the same time to reduce strain on one side. Specifically, cut your food into its normal size, cut that in half and put one piece on both sides of your mouth and chew. This will take practice.  Foods to avoid:  Nuts  Chewing gum  Crisp vegetables (e.g. carrots)  Chewy candy (e.g. gummy bears)  Crunching ice cubes  Crisp, crunchy crackers / chips  Chewy pizza crusts & breads  Hard fruits (e.g. whole apples, pears)  Chewy meats (e.g. steak)  No smoking. No caffeine. Drink plenty of water. Exercise regularly: Engage in an activity that keeps your heart rate at 60-80% of your maximum working heart rate for 20 minutes, 2-3 times a week. (Maximum working heart rate is 220 minus your age). Check with your doctor before beginning any new program.  Pain Free Jaw Function Only use your jaw in the pain-free range. If your jaw hurts when you use it, your jaw muscles may tighten and cause more pain. Just like running on a sprained ankle, overuse of jaw muscles and joints delay healing and may cause re-injury. Limit mouth opening to a pain free range. Limit mouth opening associated with yawning by supporting your chin with your hand. Consume a soft diet. Avoid firm, chewy foods. Do not bite off apples or carrots, etc. Use slower  chewing strokes and smaller bites. Avoid parafunctional habits such as chewing gum, biting fingernails, pursing lips, daytime teeth clenching and grinding, smoking cigarettes, biting pens/pencils, pushing your jaw forward, lip/cheek biting, and pushing your tongue against your teeth or the roof of the mouth. Limit musical instrument playing and singing to pain free periods. When  possible, avoid jaw joint clicking, especially if it is painful.  Habit Reversal / Muscle Relaxation Parafunctional oral habits: Normal jaw functions include activities such as eating, speaking, or swallowing. Parafunctional oral habits are habitual unconscious use of the jaw system in functions other than normal. Examples of parafunctional habits include biting fingernails, pursing lips, teeth clenching &/ grinding, biting pens/nails, positioning jaw forward, lip/cheek biting, and pushing tongue against teeth or roof of mouth. Rationale for habit reversal: Jaw muscle and joint pain is sometimes a product of over work and/or trauma to the jaw. We cannot completely rest these muscles and joints because we use them for chewing, speaking and swallowing. However, we can limit their extra use by reducing parafunctional habits. The Goal is to become aware of oral parafunctional habits, then consciously stop such habits.  Practice mouth relaxed position: The teeth should never be touching/resting together except occasionally they touch lightly with swallowing. We suggest that you closely monitor your jaw position during your waking hours so that you maintain your jaw in a relaxed, comfortable position. This involves placing the tongue lightly on the top of your mouth wherever it is most comfortable while allowing the teeth to come apart, and relaxing the jaw muscles. Often putting your tongue gently on the top of your mouth where you softly say "m" is a comfortable position. Position teeth slightly apart with lips relaxed (usually apart), and tongue resting on the floor of your mouth (not pushing against the teeth or the roof of the mouth). Practice for 1 minute, 6 times a day.  Habit Reversal Techniques Visual Cues Method Pick out 3-4 objects (visual cues) in each room in which you spend time (e.g. in your kitchen - refrigerator door handle, water faucet, etc.). Place a bright sticker on them. Each time you see  the sticker, ask yourself, "what is my mouth doing?". If it is doing anything other than resting in that mouth relaxed position, it is doing too much. Stop the unwanted oral habit. Assume the mouth relaxed position for a few seconds and mentally say to yourself "Do not (whatever you were doing)". Then resume your normal activities until you see another visual cue and ask yourself the question again.  Time Interval Method  Instead of using a sticker (above), set a recurring alarm for 30-60 minutes. Every time the alarm beeps, check if your mouth is in the relaxed position. Perform the steps as outlined in the Visual Cues Method.Head, Neck and Shoulder Hygiene  Poor head and neck mechanics can cause muscle fatigue leading to headaches, face and jaw pain. Some simple suggestions can be very beneficial to minimize these.  Establish good head, neck, shoulder and arm posture.  Keep your head centered over your shoulders, front to back and side to side.  Keep your shoulders down. Keep your elbows hanging comfortably at your sides. Do not prop them up on armrests.  Do not read in a reclined position   Try to position reading material at eye level. Prop up book and/or place book on pillows on lap to raise it up. Be sure to rest arms on a support while holding a book.  Adjust the  height of your chair so that your arms are supported at elbow level, and not propped up high, and your feet are flat on the floor. Use a chair or back pillow that supports the low back.  Adjust your work area so that minimal twisting is required to do tasks which take longer than 1-2 minutes.  If you use the telephone frequently, use a headset. Avoid propping the phone on your shoulder or neck.  Take frequent breaks when engaged in any clerical or reading activity (1-2 minutes every 20-30 minutes). Getting up and walking around repositions your head in a natural position, relaxing neck and shoulder muscles, and restoring good blood  flow.  Establish good computer use habits.  Position your computer screen at a comfortable level so that normal, untiring head posture can be maintained (do not strain your eyes).  Position computer keyboard at lap level so that your arms are hanging comfortably at your sides. Use a wrist support for typing. Use a mouse pad with a wrist support.  Flex and extend hands frequently to stretch arm muscles.  Sleep Hygiene Sleep can be improved, especially the time it takes to fall asleep, by following some simple suggestions.  Before bedtime, resolve dilemmas.  Avoid stressful situations or conversations at bedtime. Do it earlier in the day.  Make lists of things to do or lists of worries before bedtime in order to clear your mind.  Explore relaxation techniques to help you relax before bedtime  Create a soothing bedtime routine (peaceful reading (not in bed), take a warm bath or shower).  Once in bed, change the channel of your mind to thoughts of a pleasant previous activity or other pleasant visual imagery.  Know your medications. Ask which ones are likely to keep you awake.  No caffeine (in soda/coffee/tea) after noon. Do not consume large quantities of food or alcoholic beverage close to bedtime. Nicotine is also a stimulant.  Do not read or watch TV in bed. Do these activities in a chair or in another room until you are sleepy, and then go to bed.  Do not stay in bed if you cannot sleep. Instead, get up and do something else until you are sleepy.  Do not nap during the day.  Keep a regular sleeping schedule 7 days a week. Try to go to bed & wake up at the same time everyday.

## 2022-06-25 NOTE — Therapy (Signed)
OUTPATIENT PHYSICAL THERAPY TMJ TREATMENT   Patient Name: Connie Mitchell MRN: [id number] DOB:[birth year], 89 y.o., female Today's Date: 06/27/2022   PT End of Session - 06/27/22 0943     Visit Number 2    Number of Visits 17    Date for PT Re-Evaluation 08/18/22    Authorization Type eval: 06/23/22    PT Start Time 1400    PT Stop Time 1445    PT Time Calculation (min) 45 min    Activity Tolerance Patient tolerated treatment well    Behavior During Therapy [facility] for tasks assessed/performed            Past Medical History:  Diagnosis Date   A-fib ([place])    Allergy    Anxiety    Cancer ([place]) 1991   left breast. treated with rads and lumpectomy   Cancer ([place]) 2020   newly diagnosed, also left breast   Cataract 2017   Cellulitis and abscess of toe of left foot 01/2018   Dental bridge present    permanent - bottom   GERD (gastroesophageal reflux disease)    Heart murmur 2017   Afib 2022   Hx of therapeutic radiation 1991   left breast   Laryngopharyngeal reflux    sometimes food goes down wrong way and she ends up in extreme coughing/choking scenario   Personal history of radiation therapy 1991   left breast ca   Primary osteoarthritis    Thoracic aortic aneurysm (TAA) ([place])    Past Surgical History:  Procedure Laterality Date   BREAST BIOPSY Right 02/22/2020   stereo bx, ribbon clip, path pending    BREAST LUMPECTOMY Left 1991   breast ca, lumpectomy   cataracts Bilateral    COLONOSCOPY WITH PROPOFOL N/A 04/28/2021   Procedure: COLONOSCOPY WITH BIOPSY;  Surgeon: Lucilla Lame, MD;  Location: [place];  Service: Endoscopy;  Laterality: N/A;   CYSTOCELE REPAIR  2010   EYE SURGERY Bilateral 2017   cataracts extracted   JOINT REPLACEMENT     MASTECTOMY Left 07/25/2018   Invasive Mammary Carcinoma   MASTECTOMY W/ SENTINEL NODE BIOPSY Left 07/25/2018   Procedure: MASTECTOMY WITH SENTINEL LYMPH NODE BIOPSY;  Surgeon: Olean Ree, MD;  Location: [facility];   Service: General;  Laterality: Left;   OOPHORECTOMY  1995   ovarian mass - benign   POLYPECTOMY N/A 04/28/2021   Procedure: POLYPECTOMY;  Surgeon: Lucilla Lame, MD;  Location: [place];  Service: Endoscopy;  Laterality: N/A;   REPLACEMENT TOTAL KNEE Bilateral 2012,2013   SKIN BIOPSY Left    arm growth   TONSILLECTOMY  1958   TOTAL SHOULDER ARTHROPLASTY Right 2016   Patient Active Problem List   Diagnosis Date Noted   Arthralgia of right temporomandibular joint 03/27/2022   Acquired thrombophilia ([place]) 12/19/2021   Abnormal CT of the abdomen 10/01/2021   Polyp of ascending colon 10/01/2021   Chronic cough 08/13/2021   Enteritis 07/14/2020   Rectal bleed 07/14/2020   Paroxysmal atrial fibrillation ([place]) 07/14/2020   Aortic atherosclerosis ([place]) 12/12/2019   Axillary adenopathy 01/01/2019   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[place]) 12/30/2018   Osteopenia of neck of right femur 07/12/2018   Thoracic ascending aortic aneurysm ([place]) 05/27/2018   LPRD (laryngopharyngeal reflux disease) 03/17/2018   Chronic foot pain 11/25/2017   Lumbar degenerative disc disease 09/24/2017   Hx of Lyme disease 09/24/2017   Hx of basal cell carcinoma 09/24/2017   Nuclear sclerotic cataract of left eye 08/09/2015   Osteoporosis 07/04/2014  Primary localized osteoarthrosis of shoulder region 12/13/2012   Osteoarthritis 12/18/2011    PCP: Glean Hess, MD  REFERRING PROVIDER: Glean Hess, MD  REFERRING DIAGNOSIS: [phone] (ICD-10-CM) - Arthralgia of right temporomandibular joint   THERAPY DIAG: Cervicalgia  RATIONALE FOR EVALUATION AND TREATMENT: Rehabilitation  ONSET DATE: September 2023  FOLLOW UP APPT WITH PROVIDER: Yes   FROM INITIAL EVALUATION SUBJECTIVE:                                                                                                                                                                                         Chief Complaint: R TMJ pain  Pertinent  History Pt arrives reporting R sided facial pain since September 2023. No specific trauma at the time of onset. She describes the pain as a "really bad ear ache" localized over her R TMJ, R masseter, and near the angle of the R side of her mandible. She saw Dr. Army Melia and was referred for physical therapy as it was determined that the pain was originating in her R TMJ. She was also referred to ENT but was told by their office that they couldn't see her for this issue. She also went to see [facility] approximately 3 weeks ago and reports she had a clear dental exam with normal imaging. She reports that she is missing an upper and lower tooth on the L side and has a deteriorating bridge in the R lower side.  It was recommended the she see oral surgery for her R TMJ pain but she would prefer to try physical therapy first. The pain is most notable when she rotates her head to the right when sleeping and especially when she swallows with a closed mouth.  It hurts to palpate over her R TMJ. She denies any drainage from either ear and complains of difficulty swallowing however this is a chronic issue. She has a history of previous esophageal dilation however she continues to experience occasional "gagging fits." She had a swallow study and states that she was initially told that the study was normal however her pulmonologist reports some aspiration. History of L sided breast cancer and she sees oconologist this August for the last visit and then will have to follow-up every 5 years. Pt reports that she was told to avoid hard foods however this is not painful for her. She tried both warm compresses and Tylenol without any improvement. She tried to take the Baclofen however it made her feel too drowsy so she did not continue to take. Pt complains of frequent dry mouth as well as itching in her ears. No history of OSA and does  not wear CPAP.  Last Dental Examination and imaging: [facility] approximately 3 weeks ago.   Recent dental procedures or orthodontics: No Pain location: Worse is in the R TMJ but pt also gets pain in across the zygomatic process and under the angle of the right mandible Pain Severity: Present: 1/10, Best: 1/10, Worst: 7/10 Pain quality: throbbing ache in jaw but sharp pain in ear  Radiating pain: Radiates around the R side of her face Numbness/Tingling: No Aggravating factors: Swallowing with a closed mouth, rotating the head to the right when sleeping Easing factors: sleeping, no help with self-massage, no help with Tylenol, tried Baclofen but didn't like how it made her feel (drowsy) Clicking, catching, or crepitus during chewing: No 24 hour pain behavior: Sometimes worse when first waking up but not consistently History of grinding teeth: Yes Recent or remote jaw/face/neck trauma, injury, or pain: No Falls: Has patient fallen in last 6 months? No, History of prior physical therapy for this issue: No  Imaging: Yes, see history Progression (improving, worsening, unchanged): gradual worsening but today is a good day History of headaches/migraines: Yes, history of chronic headaches (occur frequently), never formally diagnosed with migraines. Headaches multiple times per week (frontal region around temples). First bout of floaters in August after laser eye surgery but not currently. Photophobia with headaches but no phonophobia or sensitivity to smell. No nausea/vomiting with headaches Ear symptoms (tinnitus, fullness, pain): Yes, "insect noises" in both ears. Intermittent bilateral ear pain but mostly on the right side. Frequent ear itching. Pt reports that she "needs hearing aides" but doesn't want to get them until she gets the itching resolved; Chest pain: Yes, intermittent L sided chest pain occasionally.  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 Denies chills/fever, night sweats, nausea, vomiting, unexplained weight gain/loss;  Precautions: None  Weight Bearing Restrictions: No  Living Environment Lives with:  lives with business partner Lives in: House/apartment, single story, 4 steps to enter/exit, no rails. Uses rollator on steps and holds flag post. Equipment: Rollator (2 different ones, one low and one upright), BSC, walk-in shower with bench, no grab bars  Patient Goals: Decrease pain.    OBJECTIVE  Patient Surveys  NDI To be completed FOTO 50, predicted improvement to 59  Mental Status Patient is oriented to person, place and time.  Recent memory is intact.  Remote memory is intact.  Attention span and concentration are intact.  Expressive speech is intact.  Patient's fund of knowledge is within normal limits for educational level.  Cranial Nerves Visual acuity and visual fields are intact  Extraocular muscles are intact  Facial sensation is intact bilaterally  Facial strength is intact bilaterally  Hearing is normal as tested by gross conversation however pt reports some loss of hearing Palate elevates midline, normal phonation  Shoulder shrug strength is intact  Tongue protrudes midline   MUSCULOSKELETAL: Tremor: None Bulk: Normal Tone: Normal Facial Symmetry: Face appears grossly symmetrical  Posture Moderate to severe upper thoracic kyphosis with forward head/neck posture  Cervical Screen Centralization: Deferred Isometrics: Full and painless in all directions Passive Accessory Intervertebral Motion (PAIVM), central and unilateral (bilaterally): Deferred  Spurlings A (ipsilateral lateral flexion/axial compression): R: Not examined L:  Not examined Spurlings B (ipsilateral lateral flexion/contralateral rotation/axial compression): R: Not examined L: Not examined Cervical Distraction Test: Not examined  Cervical Fexion-Rotation Test: Not examined  Hoffman Sign (cervical cord compression): R: Not examined L: Not examined  AROM AROM (Normal range in degrees) AROM  Cervical  Flexion (50) 50* (L upper trap pain)  Extension (80) 30  Right lateral flexion (45) 18* (L sided neck pain)  Left lateral flexion (45) 18* (R sided neck pain)  Right rotation (85)   Left rotation (85)   (* = pain; Blank rows = not tested)  Dermatome/Myotome Screen N=normal  Ab=abnormal Level Dermatome R L Myotome R L Reflex R L  C2 Posterior Scalp N N Cervical Flexion/Extension C1-2 N N Jaw CN V    C3 Anterior Neck N N Cervical Sidebend C2-3 N N Biceps C5-6    C4 Top of Shoulder N N Shoulder Shrug C4 N N Brachiorad. C5-6    C5 Lateral Upper Arm N N Shoulder ABD C4-5 N N Triceps C7    C6 Lateral Arm/ Thumb N N Arm Flex/ Wrist Ext C5-6 N N     C7 Middle Finger N N Arm Ext//Wrist Flex C6-7 N N     C8 4th & 5th Finger N N Flex/ Ext Carpi Ulnaris C8 N N     T1 Medial Arm N N Interossei T1 N N      Sensation Grossly intact to light touch bilateral face and UE as determined by testing branches of trigeminal nerve and dermatomes C2-T2  Palpation Graded on 0-4 scale (0 = no pain, 1 = pain, 2 = pain with wincing/grimacing/flinching, 3 = pain with withdrawal, 4 = unwilling to allow palpation) Location LEFT  RIGHT           Temporomandibular Joint (posterior, superior, anterior)  1  Temporalis (anterior, middle, posterior fibers)    Temporalis Tendon Insertion  1  Masseter (Zygoma, Body, Lateral surface of angle of mandible)  1  Medial ptyergoid  1  Frontal Sinus    Maxillary Sinus    SCM    Upper Trapezius  1  Subocciptials  1   Mandibular AROM Resting Dental Alignment/Occlusion (Overbite, underbite, overjet, crossbite):  Mandible Depression  (40-33m):  Mandible Protrusion (3-665m:  Mandible Lateral Excursion (10-1259m R: L:  Audible joint sounds (crepitus or clicking with stethoscope with opening, lateral deviation, and bite): Positive Reciprocal Click (palpation, click with both opening/closing): Positive Deviation of Mouth During Opening: Positive, "S" curve noted during both opening and closing; Deflection of Mouth at End Range: Negative  Strength R/L Functional Mandible Depression (Jaw Opening): Functional Mandible Protrusion: Functional Mandible Elevation (Jaw Closing) FunctionalMandible Lateral Deviation  Passive Accessory Joint Motion (PAIVM) Inferior (Caudal Glide):  Anterior Glide:  Medial Glide:  Lateral Glide: Medial and Lateral Extra-oral Glide:   Special Tests Biting on one separator (ipsilateral muscle activation/contralateral joint compression): R: Not examined L: Not examined Biting on two separators (bilateral muscle activation): R: Not examined L: Not examined Manual Joint Compression: Not examined Manual Joint Distraction: Not examined  Beighton scale: Deferred  MMT  MMT (out of 5) Right 06/23/2022 Left 06/23/2022  Cervical (isometric)  Flexion WNL  Extension WNL  Lateral Flexion WNL WNL  Rotation WNL WNL      Shoulder   Flexion 4+ 4+  Extension    Abduction 4+ 4+  Internal rotation    Horizontal abduction    Horizontal adduction  Lower Trapezius    Rhomboids        Elbow  Flexion 5 5  Extension 5 5  Pronation    Supination        Wrist  Flexion 5 5  Extension 5 5  Radial deviation    Ulnar deviation        MCP  Flexion 5 5  Extension 5 5  Abduction 5 5  Adduction 5 5  (* = pain; Blank rows = not tested)  Reflexes Deferred   TODAY'S TREATMENT    SUBJECTIVE: Pt reports that she is doing well today. No significant changes since the initial evaluation. Denies resting neck and TMJ pain upon arrival but pain persists with AROM of neck and swallowing with a closed  mouth.   PAIN: No resting pain upon arrival (pain with cervical ROM)   Manual Therapy  Pt completed NDI: 28% Cervical AROM rotation: R: 54, L: 44;  Mandibular AROM Resting Dental Alignment/Occlusion (Overbite, underbite, overjet, crossbite): Overbite noted with the R upper molars, crossbite with lower incisors noted to be approximately 69m to the right of the upper incisors.   Mandible Depression (40-519m: 5339mandible Protrusion (3-6mm8m3mm 37mdible Lateral Excursion (10-12mm)2m 8mm  L5m0mm   47mngth R/L Functional Mandible Depression (Jaw Opening): WNL, mild pain Functional Mandible Protrusion: WNL Functional Mandible Elevation (Jaw Closing): WNL Functional Mandible Lateral Deviation: WNL bilaterally  Passive Accessory Joint Motion (PAIVM) (Right) Inferior (Caudal Glide): WNL Anterior Glide: WNL Medial Glide: WNL Lateral Glide: Limited and painful Medial and Lateral Extra-oral Glide: R lateral glide is limited and painful. No issues with lateral glide  Special Tests Biting on one separator (ipsilateral muscle activation/contralateral joint compression): R: Negative L: Positive for R sided TMJ pain; Biting on two separators (bilateral muscle activation): R: Not examined L: Not examined Manual Joint Compression: Positive on the R for pain; Manual Joint Distraction: Negative  STM to R masseter and at the submandibular region near the mandibular angle; R TMJ inferior and anterior joint mobilizations, grade II-III, 20s/bout x 2 bouts each; Extensive education performed with handout regarding TMJ pain including activity modification and self-management strategies  Ther-ex  Supine repeated cervical retraction with manual overpressure by therapist x 10; Seated scapular retraction x 5; HEP issued and reviewed with patient;   PATIENT EDUCATION:  Education details: HEP, extensive education performed with handout regarding TMJ pain including activity modification and  self-management strategies Person educated: Patient Education method: Explanation and handout Education comprehension: verbalized understanding and returned demonstration   HOME EXERCISE PROGRAM: Access Code: [email or web address] Date: 06/25/2022 Prepared by: Jahnaya Branscome HuRoxana Hiresses - Supine Chin Tuck  - 6 x daily - 7 x weekly - 6 reps - 6s hold - Seated Scapular Retraction  - 6 x daily - 7 x weekly - 6 reps - 6s hold  Education: Handout provided with TMJ education   ASSESSMENT:  CLINICAL IMPRESSION: Finished TMJ assessment. Overbite noted with the R upper molars, crossbite with lower incisors noted to be approximately 2mm to t3mright of the upper incisors. Mandibular ROM grossly WNL and pain-free. Pain with resisted mandibular depression. R TMJ lateral glide is limited and painful. With pt biting on one separator on the L side pt reports R TMJ pain. Pt also with painful manual R TMJ joint compression. Both of these indicate that the TMJ joint itself might be a contributing source for her pain. Initiated joint mobilization of R TMJ as well as STM. HEP issued to  started 6x6 program including cervical and scapular retractions. Pt encouraged to follow-up as scheduled. Patient will benefit from skilled PT to address deficits in R sided facial pain in order to improve pain-free function at home.   REHAB POTENTIAL: Good  CLINICAL DECISION MAKING: Unstable/unpredictable  EVALUATION COMPLEXITY: High   GOALS: Goals reviewed with patient? Yes  SHORT TERM GOALS: Target date: 07/21/2022  Pt will be independent with HEP to improve strength and decrease R TMJ pain in order to improve pain-free function at home. Baseline:  Goal status: INITIAL   LONG TERM GOALS: Target date: 08/18/2022  Pt will increase FOTO to at least 54 to demonstrate significant improvement in function at home and work related to neck pain  Baseline: 06/23/22: 50; Goal status: INITIAL  2.   Pt will decrease worst TMJ pain by at least 3 points on the NPRS in order to demonstrate clinically significant reduction in TMJ pain. Baseline: 06/23/22: worst: 7/10; Goal status: INITIAL  3.  Pt will decrease NDI score by at least 19% in order demonstrate clinically significant reduction in neck pain/disability.       Baseline: 06/23/22: To be completed; 06/25/22: 28% Goal status: INITIAL   PLAN: PT FREQUENCY: 2x/week  PT DURATION: 8 weeks  PLANNED INTERVENTIONS: Therapeutic exercises, Therapeutic activity, Neuromuscular re-education, Balance training, Gait training, Patient/Family education, Joint manipulation, Joint mobilization, Vestibular training, Canalith repositioning, Dry Needling, Electrical stimulation, Spinal manipulation, Spinal mobilization, Cryotherapy, Moist heat, Taping, Traction, Ultrasound, Ionotophoresis '4mg'$ /ml Dexamethasone, and Manual therapy  PLAN FOR NEXT SESSION: Progress strengthening and manual therapy, review/modify HEP as needed;  Lyndel Safe Eduard Penkala PT, DPT, GCS  Jeanny Rymer 06/27/2022, 10:06 AM

## 2022-06-28 NOTE — Therapy (Signed)
OUTPATIENT PHYSICAL THERAPY TMJ TREATMENT  Patient Name: Connie Mitchell MRN: [id number] DOB:1933-12-18, 89 y.o., female Today's Date: 06/29/2022   PT End of Session - 06/29/22 0758     Visit Number 3    Number of Visits 17    Date for PT Re-Evaluation 08/18/22    Authorization Type eval: 06/23/22    PT Start Time 0800    PT Stop Time 0845    PT Time Calculation (min) 45 min    Activity Tolerance Patient tolerated treatment well    Behavior During Therapy [facility] for tasks assessed/performed            Past Medical History:  Diagnosis Date   A-fib ([place])    Allergy    Anxiety    Cancer ([place]) 1991   left breast. treated with rads and lumpectomy   Cancer ([place]) 2020   newly diagnosed, also left breast   Cataract 2017   Cellulitis and abscess of toe of left foot 01/2018   Dental bridge present    permanent - bottom   GERD (gastroesophageal reflux disease)    Heart murmur 2017   Afib 2022   Hx of therapeutic radiation 1991   left breast   Laryngopharyngeal reflux    sometimes food goes down wrong way and she ends up in extreme coughing/choking scenario   Personal history of radiation therapy 1991   left breast ca   Primary osteoarthritis    Thoracic aortic aneurysm (TAA) ([place])    Past Surgical History:  Procedure Laterality Date   BREAST BIOPSY Right 02/22/2020   stereo bx, ribbon clip, path pending    BREAST LUMPECTOMY Left 1991   breast ca, lumpectomy   cataracts Bilateral    COLONOSCOPY WITH PROPOFOL N/A 04/28/2021   Procedure: COLONOSCOPY WITH BIOPSY;  Surgeon: Lucilla Lame, MD;  Location: [place];  Service: Endoscopy;  Laterality: N/A;   CYSTOCELE REPAIR  2010   EYE SURGERY Bilateral 2017   cataracts extracted   JOINT REPLACEMENT     MASTECTOMY Left 07/25/2018   Invasive Mammary Carcinoma   MASTECTOMY W/ SENTINEL NODE BIOPSY Left 07/25/2018   Procedure: MASTECTOMY WITH SENTINEL LYMPH NODE BIOPSY;  Surgeon: Olean Ree, MD;  Location: [facility];   Service: General;  Laterality: Left;   OOPHORECTOMY  1995   ovarian mass - benign   POLYPECTOMY N/A 04/28/2021   Procedure: POLYPECTOMY;  Surgeon: Lucilla Lame, MD;  Location: [place];  Service: Endoscopy;  Laterality: N/A;   REPLACEMENT TOTAL KNEE Bilateral 2012,2013   SKIN BIOPSY Left    arm growth   TONSILLECTOMY  1958   TOTAL SHOULDER ARTHROPLASTY Right 2016   Patient Active Problem List   Diagnosis Date Noted   Arthralgia of right temporomandibular joint 03/27/2022   Acquired thrombophilia ([place]) 12/19/2021   Abnormal CT of the abdomen 10/01/2021   Polyp of ascending colon 10/01/2021   Chronic cough 08/13/2021   Enteritis 07/14/2020   Rectal bleed 07/14/2020   Paroxysmal atrial fibrillation ([place]) 07/14/2020   Aortic atherosclerosis ([place]) 12/12/2019   Axillary adenopathy 01/01/2019   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[place]) 12/30/2018   Osteopenia of neck of right femur 07/12/2018   Thoracic ascending aortic aneurysm ([place]) 05/27/2018   LPRD (laryngopharyngeal reflux disease) 03/17/2018   Chronic foot pain 11/25/2017   Lumbar degenerative disc disease 09/24/2017   Hx of Lyme disease 09/24/2017   Hx of basal cell carcinoma 09/24/2017   Nuclear sclerotic cataract of left eye 08/09/2015   Osteoporosis 07/04/2014  Primary localized osteoarthrosis of shoulder region 12/13/2012   Osteoarthritis 12/18/2011   PCP: Glean Hess, MD  REFERRING PROVIDER: Glean Hess, MD  REFERRING DIAGNOSIS: [phone] (ICD-10-CM) - Arthralgia of right temporomandibular joint   THERAPY DIAG: Cervicalgia  RATIONALE FOR EVALUATION AND TREATMENT: Rehabilitation  ONSET DATE: September 2023  FOLLOW UP APPT WITH PROVIDER: Yes   FROM INITIAL EVALUATION SUBJECTIVE:                                                                                                                                                                                         Chief Complaint: R TMJ pain  Pertinent  History Pt arrives reporting R sided facial pain since September 2023. No specific trauma at the time of onset. She describes the pain as a "really bad ear ache" localized over her R TMJ, R masseter, and near the angle of the R side of her mandible. She saw Dr. Army Melia and was referred for physical therapy as it was determined that the pain was originating in her R TMJ. She was also referred to ENT but was told by their office that they couldn't see her for this issue. She also went to see [place] approximately 3 weeks ago and reports she had a clear dental exam with normal imaging. She reports that she is missing an upper and lower tooth on the L side and has a deteriorating bridge in the R lower side.  It was recommended the she see oral surgery for her R TMJ pain but she would prefer to try physical therapy first. The pain is most notable when she rotates her head to the right when sleeping and especially when she swallows with a closed mouth.  It hurts to palpate over her R TMJ. She denies any drainage from either ear and complains of difficulty swallowing however this is a chronic issue. She has a history of previous esophageal dilation however she continues to experience occasional "gagging fits." She had a swallow study and states that she was initially told that the study was normal however her pulmonologist reports some aspiration. History of L sided breast cancer and she sees oconologist this August for the last visit and then will have to follow-up every 5 years. Pt reports that she was told to avoid hard foods however this is not painful for her. She tried both warm compresses and Tylenol without any improvement. She tried to take the Baclofen however it made her feel too drowsy so she did not continue to take. Pt complains of frequent dry mouth as well as itching in her ears. No history of OSA and does not  wear CPAP.  Last Dental Examination and imaging: [facility] approximately 3 weeks ago.   Recent dental procedures or orthodontics: No Pain location: Worse is in the R TMJ but pt also gets pain in across the zygomatic process and under the angle of the right mandible Pain Severity: Present: 1/10, Best: 1/10, Worst: 7/10 Pain quality: throbbing ache in jaw but sharp pain in ear  Radiating pain: Radiates around the R side of her face Numbness/Tingling: No Aggravating factors: Swallowing with a closed mouth, rotating the head to the right when sleeping Easing factors: sleeping, no help with self-massage, no help with Tylenol, tried Baclofen but didn't like how it made her feel (drowsy) Clicking, catching, or crepitus during chewing: No 24 hour pain behavior: Sometimes worse when first waking up but not consistently History of grinding teeth: Yes Recent or remote jaw/face/neck trauma, injury, or pain: No Falls: Has patient fallen in last 6 months? No, History of prior physical therapy for this issue: No  Imaging: Yes, see history Progression (improving, worsening, unchanged): gradual worsening but today is a good day History of headaches/migraines: Yes, history of chronic headaches (occur frequently), never formally diagnosed with migraines. Headaches multiple times per week (frontal region around temples). First bout of floaters in August after laser eye surgery but not currently. Photophobia with headaches but no phonophobia or sensitivity to smell. No nausea/vomiting with headaches Ear symptoms (tinnitus, fullness, pain): Yes, "insect noises" in both ears. Intermittent bilateral ear pain but mostly on the right side. Frequent ear itching. Pt reports that she "needs hearing aides" but doesn't want to get them until she gets the itching resolved; Chest pain: Yes, intermittent L sided chest pain occasionally.  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 Denies chills/fever, night sweats, nausea, vomiting, unexplained weight gain/loss;  Precautions: None  Weight Bearing Restrictions: No  Living Environment Lives with:  lives with business partner Lives in: House/apartment, single story, 4 steps to enter/exit, no rails. Uses rollator on steps and holds flag post. Equipment: Rollator (2 different ones, one low and one upright), BSC, walk-in shower with bench, no grab bars  Patient Goals: Decrease pain.    OBJECTIVE  Patient Surveys  NDI To be completed FOTO 50, predicted improvement to 76  Mental Status Patient is oriented to person, place and time.  Recent memory is intact.  Remote memory is intact.  Attention span and concentration are intact.  Expressive speech is intact.  Patient's fund of knowledge is within normal limits for educational level.  Cranial Nerves Visual acuity and visual fields are intact  Extraocular muscles are intact  Facial sensation is intact bilaterally  Facial strength is intact bilaterally  Hearing is normal as tested by gross conversation however pt reports some loss of hearing Palate elevates midline, normal phonation  Shoulder shrug strength is intact  Tongue protrudes midline   MUSCULOSKELETAL: Tremor: None Bulk: Normal Tone: Normal Facial Symmetry: Face appears grossly symmetrical  Posture Moderate to severe upper thoracic kyphosis with forward head/neck posture  Cervical Screen Centralization: Deferred Isometrics: Full and painless in all directions Passive Accessory Intervertebral Motion (PAIVM), central and unilateral (bilaterally): Deferred Spurlings  A (ipsilateral lateral flexion/axial compression): R: Not examined L:  Not examined Spurlings B (ipsilateral lateral flexion/contralateral rotation/axial compression): R: Not examined L: Not examined Cervical Distraction Test: Not examined  Cervical Fexion-Rotation Test: Not examined  Hoffman Sign (cervical cord compression): R: Not examined L: Not examined  AROM AROM (Normal range in degrees) AROM  Cervical  Flexion (50) 50* (L upper trap pain)  Extension (80) 30  Right lateral flexion (45) 18* (L sided neck pain)  Left lateral flexion (45) 18* (R sided neck pain)  Right rotation (85) 54  Left rotation (85) 44  (* = pain; Blank rows = not tested)  Dermatome/Myotome Screen N=normal  Ab=abnormal Level Dermatome R L Myotome R L Reflex R L  C2 Posterior Scalp N N Cervical Flexion/Extension C1-2 N N Jaw CN V    C3 Anterior Neck N N Cervical Sidebend C2-3 N N Biceps C5-6    C4 Top of Shoulder N N Shoulder Shrug C4 N N Brachiorad. C5-6    C5 Lateral Upper Arm N N Shoulder ABD C4-5 N N Triceps C7    C6 Lateral Arm/ Thumb N N Arm Flex/ Wrist Ext C5-6 N N     C7 Middle Finger N N Arm Ext//Wrist Flex C6-7 N N     C8 4th & 5th Finger N N Flex/ Ext Carpi Ulnaris C8 N N     T1 Medial Arm N N Interossei T1 N N      Sensation Grossly intact to light touch bilateral face and UE as determined by testing branches of trigeminal nerve and dermatomes C2-T2  Palpation Graded on 0-4 scale (0 = no pain, 1 = pain, 2 = pain with wincing/grimacing/flinching, 3 = pain with withdrawal, 4 = unwilling to allow palpation) Location LEFT  RIGHT           Temporomandibular Joint (posterior, superior, anterior)  1  Temporalis (anterior, middle, posterior fibers)    Temporalis Tendon Insertion  1  Masseter (Zygoma, Body, Lateral surface of angle of mandible)  1  Medial ptyergoid  1  Frontal Sinus    Maxillary Sinus    SCM    Upper Trapezius  1  Subocciptials  1   Mandibular AROM Resting Dental Alignment/Occlusion (Overbite, underbite, overjet, crossbite): Overbite noted with  the R upper molars, crossbite with lower incisors noted to be approximately 37m to the right of the upper incisors.   Mandible Depression (40-538m: 5383mandible Protrusion (3-6mm49m3mm 46mdible Lateral Excursion (10-12mm)47m 8mm  L59m0mm   24mngth R/L Functional Mandible Depression (Jaw Opening): WNL, mild pain Functional Mandible Protrusion: WNL Functional Mandible Elevation (Jaw Closing): WNL Functional Mandible Lateral Deviation: WNL bilaterally  Passive Accessory Joint Motion (PAIVM) (Right) Inferior (Caudal Glide): WNL Anterior Glide: WNL Medial Glide: WNL Lateral Glide: Limited and painful Medial and Lateral Extra-oral Glide: R lateral glide is limited and painful. No issues with lateral glide  Special Tests Biting on one separator (ipsilateral muscle activation/contralateral joint compression): R: Negative L: Positive for R sided TMJ pain; Biting on two separators (bilateral muscle activation): R: Not examined L: Not examined Manual Joint Compression: Positive on the R for pain; Manual Joint Distraction: Negative  Beighton scale: Deferred  MMT  MMT (out of 5) Right 06/23/2022 Left 06/23/2022  Cervical (isometric)  Flexion WNL  Extension WNL  Lateral Flexion WNL WNL  Rotation WNL WNL      Shoulder   Flexion 4+ 4+  Extension    Abduction 4+ 4+  Internal  rotation    Horizontal abduction    Horizontal adduction    Lower Trapezius    Rhomboids        Elbow  Flexion 5 5  Extension 5 5  Pronation    Supination        Wrist  Flexion 5 5  Extension 5 5  Radial deviation    Ulnar deviation        MCP  Flexion 5 5  Extension 5 5  Abduction 5 5  Adduction 5 5  (* = pain; Blank rows = not tested)  Reflexes Deferred   TODAY'S TREATMENT    SUBJECTIVE: Pt reports that she is doing well today. No significant changes since the last therapy session. She did have some soreness after the last session but not excessive. Reports 1/10 R TMJ pain upon arrival  today. No specific questions or concerns.    PAIN: 1/10 R TMJ pain   Trigger Point Dry Needling (TDN), unbilled Education performed with patient regarding potential benefit of TDN. Reviewed precautions and risks with patient. Pt provided verbal consent to treatment. In supine using clean technique TDN performed to R masseter with 2, 0.16 x 30 single needle placements with local twitch response (LTR). Pistoning technique utilized.    Manual Therapy  Moist heat pack applied to R side of face during interval history x 5 minutes; STM to R masseter, R temporalis, R SCM, R suboccipitals and at the submandibular region near the mandibular angle to target medial ptyergoid; R TMJ inferior joint mobilizations, grade II-III, 20s/bout x 3 bouts; CPA C2-C3, grade II, 20s/bout x 2 bouts/level; R UPA C2-C3, grade II, 20s/bout x 2 bouts/level; R C1 lateral mass mobilizations, grade II, 20s/bout x 1 bout; Gentle cervical traction 15s hold/15s relax; Cold pack applied to R masseter x 5 minutes at end of session (unbilled);   Ther-ex  Supine repeated cervical retraction with manual overpressure by therapist x 10; Mandibular lateral deviation isometrics 10s contract/10s relax x 5 bilaterally; Mandibular depression isometrics 10s contract/10s relax x 5; Seated scapular retractions x 6; Seated tongue clicks x 6; HEP updated and education provided to patient;   PATIENT EDUCATION:  Education details: Pt educated throughout session about proper posture and technique with exercises. Improved exercise technique, movement at target joints, use of target muscles after min to mod verbal, visual, tactile cues. HEP updates; Person educated: Patient Education method: Explanation and handout Education comprehension: verbalized understanding and returned demonstration   HOME EXERCISE PROGRAM: Access Code: [email or web address] URL: [email or web address] Date: 06/29/2022 Prepared by: Roxana Hires  Exercises - Supine Chin Tuck  - 6 x daily - 7 x weekly - 6 reps - 6s hold - Seated Scapular Retraction  - 6 x daily - 7 x weekly - 6 reps - 6s hold - Tongue Clicks TMJ  - 6 x daily - 7 x weekly - 6 reps  Education: Handout provided with TMJ education   ASSESSMENT:  CLINICAL IMPRESSION: Continued manual techniques including STM and joint mobilizations. Introduced dry needling to R masseter. Progressed strengthening during session and 6x6 HEP program progressed by adding tongue clicks. Plan to continue to progress strengthening at future sessions. Pt encouraged to follow-up as scheduled. She will benefit from skilled PT to address deficits in R sided facial pain in order to improve pain-free function at home.   REHAB POTENTIAL: Good  CLINICAL DECISION MAKING: Unstable/unpredictable  EVALUATION COMPLEXITY: High   GOALS: Goals reviewed with patient? Yes  SHORT TERM GOALS: Target date:  07/21/2022  Pt will be independent with HEP to improve strength and decrease R TMJ pain in order to improve pain-free function at home. Baseline:  Goal status: INITIAL   LONG TERM GOALS: Target date: 08/18/2022  Pt will increase FOTO to at least 54 to demonstrate significant improvement in function at home and work related to neck pain  Baseline: 06/23/22: 50; Goal status: INITIAL  2.  Pt will decrease worst TMJ pain by at least 3 points on the NPRS in order to demonstrate clinically significant reduction in TMJ pain. Baseline: 06/23/22: worst: 7/10; Goal status: INITIAL  3.  Pt will decrease NDI score by at least 19% in order demonstrate clinically significant reduction in neck pain/disability.       Baseline: 06/23/22: To be completed; 06/25/22: 28% Goal status: INITIAL   PLAN: PT FREQUENCY: 2x/week  PT DURATION: 8 weeks  PLANNED INTERVENTIONS: Therapeutic exercises, Therapeutic activity, Neuromuscular re-education, Balance training, Gait training, Patient/Family education, Joint  manipulation, Joint mobilization, Vestibular training, Canalith repositioning, Dry Needling, Electrical stimulation, Spinal manipulation, Spinal mobilization, Cryotherapy, Moist heat, Taping, Traction, Ultrasound, Ionotophoresis '4mg'$ /ml Dexamethasone, and Manual therapy  PLAN FOR NEXT SESSION: Progress strengthening and manual therapy, review/modify HEP as needed;  Lyndel Safe Lavere Shinsky PT, DPT, GCS  Vicy Medico 06/29/2022, 10:35 AM

## 2022-06-29 ENCOUNTER — Ambulatory Visit: Payer: Medicare Other

## 2022-06-29 DIAGNOSIS — M542 Cervicalgia: Secondary | ICD-10-CM | POA: Diagnosis not present

## 2022-06-29 DIAGNOSIS — M26621 Arthralgia of right temporomandibular joint: Secondary | ICD-10-CM | POA: Diagnosis not present

## 2022-07-02 ENCOUNTER — Ambulatory Visit: Payer: Medicare Other

## 2022-07-02 DIAGNOSIS — M26621 Arthralgia of right temporomandibular joint: Secondary | ICD-10-CM | POA: Diagnosis not present

## 2022-07-02 DIAGNOSIS — M542 Cervicalgia: Secondary | ICD-10-CM

## 2022-07-02 NOTE — Therapy (Signed)
OUTPATIENT PHYSICAL THERAPY TMJ TREATMENT  Patient Name: Connie Mitchell MRN: [id number] DOB:Mar 13, 1934, 89 y.o., female Today's Date: 07/03/2022   PT End of Session - 07/02/22 1359     Visit Number 4    Number of Visits 17    Date for PT Re-Evaluation 08/18/22    Authorization Type eval: 06/23/22    PT Start Time 1400    PT Stop Time 1445    PT Time Calculation (min) 45 min    Activity Tolerance Patient tolerated treatment well    Behavior During Therapy [facility] for tasks assessed/performed            Past Medical History:  Diagnosis Date   A-fib ([place])    Allergy    Anxiety    Cancer ([place]) 1991   left breast. treated with rads and lumpectomy   Cancer ([place]) 2020   newly diagnosed, also left breast   Cataract 2017   Cellulitis and abscess of toe of left foot 01/2018   Dental bridge present    permanent - bottom   GERD (gastroesophageal reflux disease)    Heart murmur 2017   Afib 2022   Hx of therapeutic radiation 1991   left breast   Laryngopharyngeal reflux    sometimes food goes down wrong way and she ends up in extreme coughing/choking scenario   Personal history of radiation therapy 1991   left breast ca   Primary osteoarthritis    Thoracic aortic aneurysm (TAA) ([place])    Past Surgical History:  Procedure Laterality Date   BREAST BIOPSY Right 02/22/2020   stereo bx, ribbon clip, path pending    BREAST LUMPECTOMY Left 1991   breast ca, lumpectomy   cataracts Bilateral    COLONOSCOPY WITH PROPOFOL N/A 04/28/2021   Procedure: COLONOSCOPY WITH BIOPSY;  Surgeon: Lucilla Lame, MD;  Location: [place];  Service: Endoscopy;  Laterality: N/A;   CYSTOCELE REPAIR  2010   EYE SURGERY Bilateral 2017   cataracts extracted   JOINT REPLACEMENT     MASTECTOMY Left 07/25/2018   Invasive Mammary Carcinoma   MASTECTOMY W/ SENTINEL NODE BIOPSY Left 07/25/2018   Procedure: MASTECTOMY WITH SENTINEL LYMPH NODE BIOPSY;  Surgeon: Olean Ree, MD;  Location: [facility];   Service: General;  Laterality: Left;   OOPHORECTOMY  1995   ovarian mass - benign   POLYPECTOMY N/A 04/28/2021   Procedure: POLYPECTOMY;  Surgeon: Lucilla Lame, MD;  Location: [place];  Service: Endoscopy;  Laterality: N/A;   REPLACEMENT TOTAL KNEE Bilateral 2012,2013   SKIN BIOPSY Left    arm growth   TONSILLECTOMY  1958   TOTAL SHOULDER ARTHROPLASTY Right 2016   Patient Active Problem List   Diagnosis Date Noted   Arthralgia of right temporomandibular joint 03/27/2022   Acquired thrombophilia ([place]) 12/19/2021   Abnormal CT of the abdomen 10/01/2021   Polyp of ascending colon 10/01/2021   Chronic cough 08/13/2021   Enteritis 07/14/2020   Rectal bleed 07/14/2020   Paroxysmal atrial fibrillation ([place]) 07/14/2020   Aortic atherosclerosis ([place]) 12/12/2019   Axillary adenopathy 01/01/2019   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[place]) 12/30/2018   Osteopenia of neck of right femur 07/12/2018   Thoracic ascending aortic aneurysm ([place]) 05/27/2018   LPRD (laryngopharyngeal reflux disease) 03/17/2018   Chronic foot pain 11/25/2017   Lumbar degenerative disc disease 09/24/2017   Hx of Lyme disease 09/24/2017   Hx of basal cell carcinoma 09/24/2017   Nuclear sclerotic cataract of left eye 08/09/2015   Osteoporosis 07/04/2014  Primary localized osteoarthrosis of shoulder region 12/13/2012   Osteoarthritis 12/18/2011   PCP: Glean Hess, MD  REFERRING PROVIDER: Glean Hess, MD  REFERRING DIAGNOSIS: [phone] (ICD-10-CM) - Arthralgia of right temporomandibular joint   THERAPY DIAG: Cervicalgia  RATIONALE FOR EVALUATION AND TREATMENT: Rehabilitation  ONSET DATE: September 2023  FOLLOW UP APPT WITH PROVIDER: Yes   FROM INITIAL EVALUATION SUBJECTIVE:                                                                                                                                                                                         Chief Complaint: R TMJ pain  Pertinent  History Pt arrives reporting R sided facial pain since September 2023. No specific trauma at the time of onset. She describes the pain as a "really bad ear ache" localized over her R TMJ, R masseter, and near the angle of the R side of her mandible. She saw Dr. Army Melia and was referred for physical therapy as it was determined that the pain was originating in her R TMJ. She was also referred to ENT but was told by their office that they couldn't see her for this issue. She also went to see [place] approximately 3 weeks ago and reports she had a clear dental exam with normal imaging. She reports that she is missing an upper and lower tooth on the L side and has a deteriorating bridge in the R lower side.  It was recommended the she see oral surgery for her R TMJ pain but she would prefer to try physical therapy first. The pain is most notable when she rotates her head to the right when sleeping and especially when she swallows with a closed mouth.  It hurts to palpate over her R TMJ. She denies any drainage from either ear and complains of difficulty swallowing however this is a chronic issue. She has a history of previous esophageal dilation however she continues to experience occasional "gagging fits." She had a swallow study and states that she was initially told that the study was normal however her pulmonologist reports some aspiration. History of L sided breast cancer and she sees oconologist this August for the last visit and then will have to follow-up every 5 years. Pt reports that she was told to avoid hard foods however this is not painful for her. She tried both warm compresses and Tylenol without any improvement. She tried to take the Baclofen however it made her feel too drowsy so she did not continue to take. Pt complains of frequent dry mouth as well as itching in her ears. No history of OSA and does not  wear CPAP.  Last Dental Examination and imaging: [place] [facility] approximately 3 weeks ago.   Recent dental procedures or orthodontics: No Pain location: Worse is in the R TMJ but pt also gets pain in across the zygomatic process and under the angle of the right mandible Pain Severity: Present: 1/10, Best: 1/10, Worst: 7/10 Pain quality: throbbing ache in jaw but sharp pain in ear  Radiating pain: Radiates around the R side of her face Numbness/Tingling: No Aggravating factors: Swallowing with a closed mouth, rotating the head to the right when sleeping Easing factors: sleeping, no help with self-massage, no help with Tylenol, tried Baclofen but didn't like how it made her feel (drowsy) Clicking, catching, or crepitus during chewing: No 24 hour pain behavior: Sometimes worse when first waking up but not consistently History of grinding teeth: Yes Recent or remote jaw/face/neck trauma, injury, or pain: No Falls: Has patient fallen in last 6 months? No, History of prior physical therapy for this issue: No  Imaging: Yes, see history Progression (improving, worsening, unchanged): gradual worsening but today is a good day History of headaches/migraines: Yes, history of chronic headaches (occur frequently), never formally diagnosed with migraines. Headaches multiple times per week (frontal region around temples). First bout of floaters in August after laser eye surgery but not currently. Photophobia with headaches but no phonophobia or sensitivity to smell. No nausea/vomiting with headaches Ear symptoms (tinnitus, fullness, pain): Yes, "insect noises" in both ears. Intermittent bilateral ear pain but mostly on the right side. Frequent ear itching. Pt reports that she "needs hearing aides" but doesn't want to get them until she gets the itching resolved; Chest pain: Yes, intermittent L sided chest pain occasionally.  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 Denies chills/fever, night sweats, nausea, vomiting, unexplained weight gain/loss;  Precautions: None  Weight Bearing Restrictions: No  Living Environment Lives with:  lives with business partner Lives in: House/apartment, single story, 4 steps to enter/exit, no rails. Uses rollator on steps and holds flag post. Equipment: Rollator (2 different ones, one low and one upright), BSC, walk-in shower with bench, no grab bars  Patient Goals: Decrease pain.    OBJECTIVE  Patient Surveys  NDI To be completed FOTO 50, predicted improvement to 48  Mental Status Patient is oriented to person, place and time.  Recent memory is intact.  Remote memory is intact.  Attention span and concentration are intact.  Expressive speech is intact.  Patient's fund of knowledge is within normal limits for educational level.  Cranial Nerves Visual acuity and visual fields are intact  Extraocular muscles are intact  Facial sensation is intact bilaterally  Facial strength is intact bilaterally  Hearing is normal as tested by gross conversation however pt reports some loss of hearing Palate elevates midline, normal phonation  Shoulder shrug strength is intact  Tongue protrudes midline   MUSCULOSKELETAL: Tremor: None Bulk: Normal Tone: Normal Facial Symmetry: Face appears grossly symmetrical  Posture Moderate to severe upper thoracic kyphosis with forward head/neck posture  Cervical Screen Centralization: Deferred Isometrics: Full and painless in all directions Passive Accessory Intervertebral Motion (PAIVM), central and unilateral (bilaterally): Deferred Spurlings  A (ipsilateral lateral flexion/axial compression): R: Not examined L:  Not examined Spurlings B (ipsilateral lateral flexion/contralateral rotation/axial compression): R: Not examined L: Not examined Cervical Distraction Test: Not examined  Cervical Fexion-Rotation Test: Not examined  Hoffman Sign (cervical cord compression): R: Not examined L: Not examined  AROM AROM (Normal range in degrees) AROM  Cervical  Flexion (50) 50* (L upper trap pain)  Extension (80) 30  Right lateral flexion (45) 18* (L sided neck pain)  Left lateral flexion (45) 18* (R sided neck pain)  Right rotation (85) 54  Left rotation (85) 44  (* = pain; Blank rows = not tested)  Dermatome/Myotome Screen N=normal  Ab=abnormal Level Dermatome R L Myotome R L Reflex R L  C2 Posterior Scalp N N Cervical Flexion/Extension C1-2 N N Jaw CN V    C3 Anterior Neck N N Cervical Sidebend C2-3 N N Biceps C5-6    C4 Top of Shoulder N N Shoulder Shrug C4 N N Brachiorad. C5-6    C5 Lateral Upper Arm N N Shoulder ABD C4-5 N N Triceps C7    C6 Lateral Arm/ Thumb N N Arm Flex/ Wrist Ext C5-6 N N     C7 Middle Finger N N Arm Ext//Wrist Flex C6-7 N N     C8 4th & 5th Finger N N Flex/ Ext Carpi Ulnaris C8 N N     T1 Medial Arm N N Interossei T1 N N      Sensation Grossly intact to light touch bilateral face and UE as determined by testing branches of trigeminal nerve and dermatomes C2-T2  Palpation Graded on 0-4 scale (0 = no pain, 1 = pain, 2 = pain with wincing/grimacing/flinching, 3 = pain with withdrawal, 4 = unwilling to allow palpation) Location LEFT  RIGHT           Temporomandibular Joint (posterior, superior, anterior)  1  Temporalis (anterior, middle, posterior fibers)    Temporalis Tendon Insertion  1  Masseter (Zygoma, Body, Lateral surface of angle of mandible)  1  Medial ptyergoid  1  Frontal Sinus    Maxillary Sinus    SCM    Upper Trapezius  1  Subocciptials  1   Mandibular AROM Resting Dental Alignment/Occlusion (Overbite, underbite, overjet, crossbite): Overbite noted with  the R upper molars, crossbite with lower incisors noted to be approximately 3m to the right of the upper incisors.   Mandible Depression (40-541m: 53109mandible Protrusion (3-6mm3m3mm 26mdible Lateral Excursion (10-12mm)77m 8mm  L75m0mm   42mngth R/L Functional Mandible Depression (Jaw Opening): WNL, mild pain Functional Mandible Protrusion: WNL Functional Mandible Elevation (Jaw Closing): WNL Functional Mandible Lateral Deviation: WNL bilaterally  Passive Accessory Joint Motion (PAIVM) (Right) Inferior (Caudal Glide): WNL Anterior Glide: WNL Medial Glide: WNL Lateral Glide: Limited and painful Medial and Lateral Extra-oral Glide: R lateral glide is limited and painful. No issues with lateral glide  Special Tests Biting on one separator (ipsilateral muscle activation/contralateral joint compression): R: Negative L: Positive for R sided TMJ pain; Biting on two separators (bilateral muscle activation): R: Not examined L: Not examined Manual Joint Compression: Positive on the R for pain; Manual Joint Distraction: Negative  Beighton scale: Deferred  MMT  MMT (out of 5) Right 06/23/2022 Left 06/23/2022  Cervical (isometric)  Flexion WNL  Extension WNL  Lateral Flexion WNL WNL  Rotation WNL WNL      Shoulder   Flexion 4+ 4+  Extension    Abduction 4+ 4+  Internal  rotation    Horizontal abduction    Horizontal adduction    Lower Trapezius    Rhomboids        Elbow  Flexion 5 5  Extension 5 5  Pronation    Supination        Wrist  Flexion 5 5  Extension 5 5  Radial deviation    Ulnar deviation        MCP  Flexion 5 5  Extension 5 5  Abduction 5 5  Adduction 5 5  (* = pain; Blank rows = not tested)  Reflexes Deferred   TODAY'S TREATMENT    SUBJECTIVE: Pt reports that she is doing well today. No significant changes since the last therapy session. No excessive soreness reported after the last session. Denies resting R TMJ pain upon arrival today but she  is having some mild neck pain. No specific questions or concerns.    PAIN: Denies TMJ pain but reports mild neck pain   Trigger Point Dry Needling (TDN), unbilled Education previoulsy performed with patient regarding potential benefit of TDN. Previously eviewed precautions and risks with patient. Pt provided verbal consent to treatment. In supine using clean technique TDN performed to R masseter with 2, 0.16 x 30 single needle placements with local twitch response (LTR). Also performed 1 additional 0.16 x 30 single needle placement in R lateral pterygoid;  Pistoning technique utilized.    Manual Therapy  Moist heat pack applied to R side of face during interval history x 5 minutes; STM to R masseter, R temporalis, R SCM, R suboccipitals and at the submandibular region near the mandibular angle to target medial ptyergoid; Supine CPA C2-C3, grade II, 20s/bout x 2 bouts/level; Gentle cervical traction 15s hold/15s relax; Lateral flexion and upper trap stretches x 30s each bilateral;   Ther-ex  Supine repeated cervical retraction with manual overpressure by therapist x 10; Mandibular lateral deviation isometrics 5s contract/5s relax x 10 bilaterally; Mandibular depression isometrics 5s contract/5s relax x 10; Mandibular elevation isometrics 5s contract/5s relax x 10;  Supine isometric cervical rotation 5s contract/5s relax x 10 bilateral; HEP reinforced to patient;   PATIENT EDUCATION:  Education details: Pt educated throughout session about proper posture and technique with exercises. Improved exercise technique, movement at target joints, use of target muscles after min to mod verbal, visual, tactile cues. HEP updates; Person educated: Patient Education method: Explanation and handout Education comprehension: verbalized understanding and returned demonstration   HOME EXERCISE PROGRAM: Access Code: [email or web address] URL: [email or web address] Date: 06/29/2022 Prepared by:  Roxana Hires  Exercises - Supine Chin Tuck  - 6 x daily - 7 x weekly - 6 reps - 6s hold - Seated Scapular Retraction  - 6 x daily - 7 x weekly - 6 reps - 6s hold - Tongue Clicks TMJ  - 6 x daily - 7 x weekly - 6 reps  Education: Handout provided with TMJ education   ASSESSMENT:  CLINICAL IMPRESSION: Continued manual techniques including STM and joint mobilizations. Introduced dry needling to R masseter. Progressed strengthening during session and 6x6 HEP program progressed by adding tongue clicks. Plan to continue to progress strengthening at future sessions. Pt encouraged to follow-up as scheduled. She will benefit from skilled PT to address deficits in R sided facial pain in order to improve pain-free function at home.   REHAB POTENTIAL: Good  CLINICAL DECISION MAKING: Unstable/unpredictable  EVALUATION COMPLEXITY: High   GOALS: Goals reviewed with patient? Yes  SHORT TERM GOALS: Target date: 07/21/2022  Pt  will be independent with HEP to improve strength and decrease R TMJ pain in order to improve pain-free function at home. Baseline:  Goal status: INITIAL   LONG TERM GOALS: Target date: 08/18/2022  Pt will increase FOTO to at least 54 to demonstrate significant improvement in function at home and work related to neck pain  Baseline: 06/23/22: 50; Goal status: INITIAL  2.  Pt will decrease worst TMJ pain by at least 3 points on the NPRS in order to demonstrate clinically significant reduction in TMJ pain. Baseline: 06/23/22: worst: 7/10; Goal status: INITIAL  3.  Pt will decrease NDI score by at least 19% in order demonstrate clinically significant reduction in neck pain/disability.       Baseline: 06/23/22: To be completed; 06/25/22: 28% Goal status: INITIAL   PLAN: PT FREQUENCY: 2x/week  PT DURATION: 8 weeks  PLANNED INTERVENTIONS: Therapeutic exercises, Therapeutic activity, Neuromuscular re-education, Balance training, Gait training, Patient/Family education, Joint  manipulation, Joint mobilization, Vestibular training, Canalith repositioning, Dry Needling, Electrical stimulation, Spinal manipulation, Spinal mobilization, Cryotherapy, Moist heat, Taping, Traction, Ultrasound, Ionotophoresis '4mg'$ /ml Dexamethasone, and Manual therapy  PLAN FOR NEXT SESSION: Progress strengthening and manual therapy, review/modify HEP as needed;  Lyndel Safe Veronique Warga PT, DPT, GCS  Greogory Cornette 07/03/2022, 1:36 PM

## 2022-07-03 NOTE — Therapy (Signed)
OUTPATIENT PHYSICAL THERAPY TMJ TREATMENT  Patient Name: Connie Mitchell MRN: [id number] DOB:Aug 07, 1933, 89 y.o., female Today's Date: 07/06/2022   PT End of Session - 07/06/22 0816     Visit Number 5    Number of Visits 17    Date for PT Re-Evaluation 08/18/22    Authorization Type eval: 06/23/22    PT Start Time 0804    PT Stop Time 6378    PT Time Calculation (min) 43 min    Activity Tolerance Patient tolerated treatment well    Behavior During Therapy [facility] for tasks assessed/performed            Past Medical History:  Diagnosis Date   A-fib ([place])    Allergy    Anxiety    Cancer ([place]) 1991   left breast. treated with rads and lumpectomy   Cancer ([place]) 2020   newly diagnosed, also left breast   Cataract 2017   Cellulitis and abscess of toe of left foot 01/2018   Dental bridge present    permanent - bottom   GERD (gastroesophageal reflux disease)    Heart murmur 2017   Afib 2022   Hx of therapeutic radiation 1991   left breast   Laryngopharyngeal reflux    sometimes food goes down wrong way and she ends up in extreme coughing/choking scenario   Personal history of radiation therapy 1991   left breast ca   Primary osteoarthritis    Thoracic aortic aneurysm (TAA) ([place])    Past Surgical History:  Procedure Laterality Date   BREAST BIOPSY Right 02/22/2020   stereo bx, ribbon clip, path pending    BREAST LUMPECTOMY Left 1991   breast ca, lumpectomy   cataracts Bilateral    COLONOSCOPY WITH PROPOFOL N/A 04/28/2021   Procedure: COLONOSCOPY WITH BIOPSY;  Surgeon: Lucilla Lame, MD;  Location: [facility];  Service: Endoscopy;  Laterality: N/A;   CYSTOCELE REPAIR  2010   EYE SURGERY Bilateral 2017   cataracts extracted   JOINT REPLACEMENT     MASTECTOMY Left 07/25/2018   Invasive Mammary Carcinoma   MASTECTOMY W/ SENTINEL NODE BIOPSY Left 07/25/2018   Procedure: MASTECTOMY WITH SENTINEL LYMPH NODE BIOPSY;  Surgeon: Olean Ree, MD;  Location: [facility];   Service: General;  Laterality: Left;   OOPHORECTOMY  1995   ovarian mass - benign   POLYPECTOMY N/A 04/28/2021   Procedure: POLYPECTOMY;  Surgeon: Lucilla Lame, MD;  Location: [facility];  Service: Endoscopy;  Laterality: N/A;   REPLACEMENT TOTAL KNEE Bilateral 2012,2013   SKIN BIOPSY Left    arm growth   TONSILLECTOMY  1958   TOTAL SHOULDER ARTHROPLASTY Right 2016   Patient Active Problem List   Diagnosis Date Noted   Arthralgia of right temporomandibular joint 03/27/2022   Acquired thrombophilia ([place]) 12/19/2021   Abnormal CT of the abdomen 10/01/2021   Polyp of ascending colon 10/01/2021   Chronic cough 08/13/2021   Enteritis 07/14/2020   Rectal bleed 07/14/2020   Paroxysmal atrial fibrillation ([place]) 07/14/2020   Aortic atherosclerosis ([place]) 12/12/2019   Axillary adenopathy 01/01/2019   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[place]) 12/30/2018   Osteopenia of neck of right femur 07/12/2018   Thoracic ascending aortic aneurysm ([place]) 05/27/2018   LPRD (laryngopharyngeal reflux disease) 03/17/2018   Chronic foot pain 11/25/2017   Lumbar degenerative disc disease 09/24/2017   Hx of Lyme disease 09/24/2017   Hx of basal cell carcinoma 09/24/2017   Nuclear sclerotic cataract of left eye 08/09/2015   Osteoporosis 07/04/2014  Primary localized osteoarthrosis of shoulder region 12/13/2012   Osteoarthritis 12/18/2011   PCP: Glean Hess, MD  REFERRING PROVIDER: Glean Hess, MD  REFERRING DIAGNOSIS: [phone] (ICD-10-CM) - Arthralgia of right temporomandibular joint   THERAPY DIAG: Cervicalgia  RATIONALE FOR EVALUATION AND TREATMENT: Rehabilitation  ONSET DATE: September 2023  FOLLOW UP APPT WITH PROVIDER: Yes   FROM INITIAL EVALUATION SUBJECTIVE:                                                                                                                                                                                         Chief Complaint: R TMJ pain  Pertinent  History Pt arrives reporting R sided facial pain since September 2023. No specific trauma at the time of onset. She describes the pain as a "really bad ear ache" localized over her R TMJ, R masseter, and near the angle of the R side of her mandible. She saw Dr. Army Melia and was referred for physical therapy as it was determined that the pain was originating in her R TMJ. She was also referred to ENT but was told by their office that they couldn't see her for this issue. She also went to see [facility] approximately 3 weeks ago and reports she had a clear dental exam with normal imaging. She reports that she is missing an upper and lower tooth on the L side and has a deteriorating bridge in the R lower side.  It was recommended the she see oral surgery for her R TMJ pain but she would prefer to try physical therapy first. The pain is most notable when she rotates her head to the right when sleeping and especially when she swallows with a closed mouth.  It hurts to palpate over her R TMJ. She denies any drainage from either ear and complains of difficulty swallowing however this is a chronic issue. She has a history of previous esophageal dilation however she continues to experience occasional "gagging fits." She had a swallow study and states that she was initially told that the study was normal however her pulmonologist reports some aspiration. History of L sided breast cancer and she sees oconologist this August for the last visit and then will have to follow-up every 5 years. Pt reports that she was told to avoid hard foods however this is not painful for her. She tried both warm compresses and Tylenol without any improvement. She tried to take the Baclofen however it made her feel too drowsy so she did not continue to take. Pt complains of frequent dry mouth as well as itching in her ears. No history of OSA and does not  wear CPAP.  Last Dental Examination and imaging: [facility] approximately 3 weeks ago.   Recent dental procedures or orthodontics: No Pain location: Worse is in the R TMJ but pt also gets pain in across the zygomatic process and under the angle of the right mandible Pain Severity: Present: 1/10, Best: 1/10, Worst: 7/10 Pain quality: throbbing ache in jaw but sharp pain in ear  Radiating pain: Radiates around the R side of her face Numbness/Tingling: No Aggravating factors: Swallowing with a closed mouth, rotating the head to the right when sleeping Easing factors: sleeping, no help with self-massage, no help with Tylenol, tried Baclofen but didn't like how it made her feel (drowsy) Clicking, catching, or crepitus during chewing: No 24 hour pain behavior: Sometimes worse when first waking up but not consistently History of grinding teeth: Yes Recent or remote jaw/face/neck trauma, injury, or pain: No Falls: Has patient fallen in last 6 months? No, History of prior physical therapy for this issue: No  Imaging: Yes, see history Progression (improving, worsening, unchanged): gradual worsening but today is a good day History of headaches/migraines: Yes, history of chronic headaches (occur frequently), never formally diagnosed with migraines. Headaches multiple times per week (frontal region around temples). First bout of floaters in August after laser eye surgery but not currently. Photophobia with headaches but no phonophobia or sensitivity to smell. No nausea/vomiting with headaches Ear symptoms (tinnitus, fullness, pain): Yes, "insect noises" in both ears. Intermittent bilateral ear pain but mostly on the right side. Frequent ear itching. Pt reports that she "needs hearing aides" but doesn't want to get them until she gets the itching resolved; Chest pain: Yes, intermittent L sided chest pain occasionally.  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 Denies chills/fever, night sweats, nausea, vomiting, unexplained weight gain/loss;  Precautions: None  Weight Bearing Restrictions: No  Living Environment Lives with:  lives with business partner Lives in: House/apartment, single story, 4 steps to enter/exit, no rails. Uses rollator on steps and holds flag post. Equipment: Rollator (2 different ones, one low and one upright), BSC, walk-in shower with bench, no grab bars  Patient Goals: Decrease pain.    OBJECTIVE  Patient Surveys  NDI To be completed FOTO 50, predicted improvement to 3  Mental Status Patient is oriented to person, place and time.  Recent memory is intact.  Remote memory is intact.  Attention span and concentration are intact.  Expressive speech is intact.  Patient's fund of knowledge is within normal limits for educational level.  Cranial Nerves Visual acuity and visual fields are intact  Extraocular muscles are intact  Facial sensation is intact bilaterally  Facial strength is intact bilaterally  Hearing is normal as tested by gross conversation however pt reports some loss of hearing Palate elevates midline, normal phonation  Shoulder shrug strength is intact  Tongue protrudes midline   MUSCULOSKELETAL: Tremor: None Bulk: Normal Tone: Normal Facial Symmetry: Face appears grossly symmetrical  Posture Moderate to severe upper thoracic kyphosis with forward head/neck posture  Cervical Screen Centralization: Deferred Isometrics: Full and painless in all directions Passive Accessory Intervertebral Motion (PAIVM), central and unilateral (bilaterally): Deferred Spurlings  A (ipsilateral lateral flexion/axial compression): R: Not examined L:  Not examined Spurlings B (ipsilateral lateral flexion/contralateral rotation/axial compression): R: Not examined L: Not examined Cervical Distraction Test: Not examined  Cervical Fexion-Rotation Test: Not examined  Hoffman Sign (cervical cord compression): R: Not examined L: Not examined  AROM AROM (Normal range in degrees) AROM  Cervical  Flexion (50) 50* (L upper trap pain)  Extension (80) 30  Right lateral flexion (45) 18* (L sided neck pain)  Left lateral flexion (45) 18* (R sided neck pain)  Right rotation (85) 54  Left rotation (85) 44  (* = pain; Blank rows = not tested)  Dermatome/Myotome Screen N=normal  Ab=abnormal Level Dermatome R L Myotome R L Reflex R L  C2 Posterior Scalp N N Cervical Flexion/Extension C1-2 N N Jaw CN V    C3 Anterior Neck N N Cervical Sidebend C2-3 N N Biceps C5-6    C4 Top of Shoulder N N Shoulder Shrug C4 N N Brachiorad. C5-6    C5 Lateral Upper Arm N N Shoulder ABD C4-5 N N Triceps C7    C6 Lateral Arm/ Thumb N N Arm Flex/ Wrist Ext C5-6 N N     C7 Middle Finger N N Arm Ext//Wrist Flex C6-7 N N     C8 4th & 5th Finger N N Flex/ Ext Carpi Ulnaris C8 N N     T1 Medial Arm N N Interossei T1 N N      Sensation Grossly intact to light touch bilateral face and UE as determined by testing branches of trigeminal nerve and dermatomes C2-T2  Palpation Graded on 0-4 scale (0 = no pain, 1 = pain, 2 = pain with wincing/grimacing/flinching, 3 = pain with withdrawal, 4 = unwilling to allow palpation) Location LEFT  RIGHT           Temporomandibular Joint (posterior, superior, anterior)  1  Temporalis (anterior, middle, posterior fibers)    Temporalis Tendon Insertion  1  Masseter (Zygoma, Body, Lateral surface of angle of mandible)  1  Medial ptyergoid  1  Frontal Sinus    Maxillary Sinus    SCM    Upper Trapezius  1  Subocciptials  1   Mandibular AROM Resting Dental Alignment/Occlusion (Overbite, underbite, overjet, crossbite): Overbite noted with  the R upper molars, crossbite with lower incisors noted to be approximately 93m to the right of the upper incisors.   Mandible Depression (40-559m: 5347mandible Protrusion (3-6mm3m3mm 59mdible Lateral Excursion (10-12mm)6m 8mm  L48m0mm   67mngth R/L Functional Mandible Depression (Jaw Opening): WNL, mild pain Functional Mandible Protrusion: WNL Functional Mandible Elevation (Jaw Closing): WNL Functional Mandible Lateral Deviation: WNL bilaterally  Passive Accessory Joint Motion (PAIVM) (Right) Inferior (Caudal Glide): WNL Anterior Glide: WNL Medial Glide: WNL Lateral Glide: Limited and painful Medial and Lateral Extra-oral Glide: R lateral glide is limited and painful. No issues with lateral glide  Special Tests Biting on one separator (ipsilateral muscle activation/contralateral joint compression): R: Negative L: Positive for R sided TMJ pain; Biting on two separators (bilateral muscle activation): R: Not examined L: Not examined Manual Joint Compression: Positive on the R for pain; Manual Joint Distraction: Negative  Beighton scale: Deferred  MMT  MMT (out of 5) Right 06/23/2022 Left 06/23/2022  Cervical (isometric)  Flexion WNL  Extension WNL  Lateral Flexion WNL WNL  Rotation WNL WNL      Shoulder   Flexion 4+ 4+  Extension    Abduction 4+ 4+  Internal  rotation    Horizontal abduction    Horizontal adduction    Lower Trapezius    Rhomboids        Elbow  Flexion 5 5  Extension 5 5  Pronation    Supination        Wrist  Flexion 5 5  Extension 5 5  Radial deviation    Ulnar deviation        MCP  Flexion 5 5  Extension 5 5  Abduction 5 5  Adduction 5 5  (* = pain; Blank rows = not tested)  Reflexes Deferred   TODAY'S TREATMENT    SUBJECTIVE: Pt reports that she is doing well today. She noticed significant improvement in her jaw pain/function after the last therapy session. She is complaining of chronic R hip pain upon arrival but otherwise  no questions/concerns.     PAIN: Denies TMJ or neck pain upon arrival, chronic R hip pain.   Trigger Point Dry Needling (TDN), unbilled Education previously performed with patient regarding potential benefit of TDN. Previously eviewed precautions and risks with patient. Pt provided verbal consent to treatment. In supine using clean technique TDN performed to R masseter with 2, 0.16 x 30 single needle placements with local twitch response (LTR). Also performed 1 additional 0.16 x 30 single needle placement in R lateral pterygoid. Pistoning technique utilized.    Manual Therapy  Moist heat pack applied to R side of face during interval history x 5 minutes; STM to R masseter (intra and extraoral), R SCM, R suboccipitals and at the submandibular region near the mandibular angle to target medial ptyergoid; Supine CPA C2-C3, grade II, 20s/bout x 2 bouts/level; R TMJ inferior mobilizations, grade III, 3 x 30s; Gentle cervical traction 15s hold/15s relax x 2; Lateral flexion and upper trap stretches x 30s each bilateral;   Ther-ex  Supine repeated cervical retraction with manual overpressure by therapist x 10; Mandibular lateral deviation isometrics 5s contract/5s relax x 5 bilaterally; Mandibular depression isometrics 5s contract/5s relax x 5; Mandibular elevation isometrics 5s contract/5s relax x 5;  Supine cervical rotation and lateral flexion with manual resistance x 5 each direction bilaterally;   PATIENT EDUCATION:  Education details: Pt educated throughout session about proper posture and technique with exercises. Improved exercise technique, movement at target joints, use of target muscles after min to mod verbal, visual, tactile cues. Person educated: Patient Education method: Explanation  Education comprehension: verbalized understanding and returned demonstration   HOME EXERCISE PROGRAM: Access Code: [email or web address] URL: [email or web address] Date: 06/29/2022 Prepared  by: Roxana Hires  Exercises - Supine Chin Tuck  - 6 x daily - 7 x weekly - 6 reps - 6s hold - Seated Scapular Retraction  - 6 x daily - 7 x weekly - 6 reps - 6s hold - Tongue Clicks TMJ  - 6 x daily - 7 x weekly - 6 reps  Education: Handout provided with TMJ education   ASSESSMENT:  CLINICAL IMPRESSION: Pt reporting significant improvement in pain/function after therapy sessions. Continued manual techniques including STM and joint mobilizations. Repeated dry needling to R masseter and R lateral ptyergoid. Progressed strengthening during session today. Plan to continue to progress strengthening at future sessions. Pt encouraged to follow-up as scheduled. She will benefit from skilled PT to address deficits in R sided facial pain in order to improve pain-free function at home.   REHAB POTENTIAL: Good  CLINICAL DECISION MAKING: Unstable/unpredictable  EVALUATION COMPLEXITY: High   GOALS: Goals reviewed with patient? Yes  SHORT  TERM GOALS: Target date: 07/21/2022  Pt will be independent with HEP to improve strength and decrease R TMJ pain in order to improve pain-free function at home. Baseline:  Goal status: INITIAL   LONG TERM GOALS: Target date: 08/18/2022  Pt will increase FOTO to at least 54 to demonstrate significant improvement in function at home and work related to neck pain  Baseline: 06/23/22: 50; Goal status: INITIAL  2.  Pt will decrease worst TMJ pain by at least 3 points on the NPRS in order to demonstrate clinically significant reduction in TMJ pain. Baseline: 06/23/22: worst: 7/10; Goal status: INITIAL  3.  Pt will decrease NDI score by at least 19% in order demonstrate clinically significant reduction in neck pain/disability.       Baseline: 06/23/22: To be completed; 06/25/22: 28% Goal status: INITIAL   PLAN: PT FREQUENCY: 2x/week  PT DURATION: 8 weeks  PLANNED INTERVENTIONS: Therapeutic exercises, Therapeutic activity, Neuromuscular re-education, Balance  training, Gait training, Patient/Family education, Joint manipulation, Joint mobilization, Vestibular training, Canalith repositioning, Dry Needling, Electrical stimulation, Spinal manipulation, Spinal mobilization, Cryotherapy, Moist heat, Taping, Traction, Ultrasound, Ionotophoresis '4mg'$ /ml Dexamethasone, and Manual therapy  PLAN FOR NEXT SESSION: Progress strengthening and manual therapy, review/modify HEP as needed;  Lyndel Safe Shareese Macha PT, DPT, GCS  Jessia Kief 07/06/2022, 11:18 AM

## 2022-07-06 ENCOUNTER — Ambulatory Visit: Payer: Medicare Other

## 2022-07-06 DIAGNOSIS — M542 Cervicalgia: Secondary | ICD-10-CM

## 2022-07-06 DIAGNOSIS — M26621 Arthralgia of right temporomandibular joint: Secondary | ICD-10-CM | POA: Diagnosis not present

## 2022-07-13 ENCOUNTER — Ambulatory Visit: Payer: Medicare Other

## 2022-07-13 DIAGNOSIS — M26621 Arthralgia of right temporomandibular joint: Secondary | ICD-10-CM | POA: Diagnosis not present

## 2022-07-13 DIAGNOSIS — M542 Cervicalgia: Secondary | ICD-10-CM | POA: Diagnosis not present

## 2022-07-13 NOTE — Therapy (Signed)
OUTPATIENT PHYSICAL THERAPY TREATMENT  Patient Name: Connie Mitchell MRN: [id number] DOB:05/05/1934, 89 y.o., female Today's Date: 07/13/2022   PT End of Session - 07/13/22 0802     Visit Number 6    Number of Visits 17    Date for PT Re-Evaluation 08/18/22    Authorization Type Medicare A&B; BCBS Secondary    Authorization Time Period 06/23/22-08/18/22    Progress Note Due on Visit 10    PT Start Time 0800    PT Stop Time 0840    PT Time Calculation (min) 40 min    Activity Tolerance Patient tolerated treatment well;No increased pain    Behavior During Therapy WFL for tasks assessed/performed            Past Medical History:  Diagnosis Date   A-fib ([place])    Allergy    Anxiety    Cancer ([place]) 1991   left breast. treated with rads and lumpectomy   Cancer ([place]) 2020   newly diagnosed, also left breast   Cataract 2017   Cellulitis and abscess of toe of left foot 01/2018   Dental bridge present    permanent - bottom   GERD (gastroesophageal reflux disease)    Heart murmur 2017   Afib 2022   Hx of therapeutic radiation 1991   left breast   Laryngopharyngeal reflux    sometimes food goes down wrong way and she ends up in extreme coughing/choking scenario   Personal history of radiation therapy 1991   left breast ca   Primary osteoarthritis    Thoracic aortic aneurysm (TAA) ([place])    Past Surgical History:  Procedure Laterality Date   BREAST BIOPSY Right 02/22/2020   stereo bx, ribbon clip, path pending    BREAST LUMPECTOMY Left 1991   breast ca, lumpectomy   cataracts Bilateral    COLONOSCOPY WITH PROPOFOL N/A 04/28/2021   Procedure: COLONOSCOPY WITH BIOPSY;  Surgeon: Lucilla Lame, MD;  Location: [facility];  Service: Endoscopy;  Laterality: N/A;   CYSTOCELE REPAIR  2010   EYE SURGERY Bilateral 2017   cataracts extracted   JOINT REPLACEMENT     MASTECTOMY Left 07/25/2018   Invasive Mammary Carcinoma   MASTECTOMY W/ SENTINEL NODE BIOPSY Left 07/25/2018    Procedure: MASTECTOMY WITH SENTINEL LYMPH NODE BIOPSY;  Surgeon: Olean Ree, MD;  Location: [facility];  Service: General;  Laterality: Left;   OOPHORECTOMY  1995   ovarian mass - benign   POLYPECTOMY N/A 04/28/2021   Procedure: POLYPECTOMY;  Surgeon: Lucilla Lame, MD;  Location: [facility];  Service: Endoscopy;  Laterality: N/A;   REPLACEMENT TOTAL KNEE Bilateral 2012,2013   SKIN BIOPSY Left    arm growth   TONSILLECTOMY  1958   TOTAL SHOULDER ARTHROPLASTY Right 2016   Patient Active Problem List   Diagnosis Date Noted   Arthralgia of right temporomandibular joint 03/27/2022   Acquired thrombophilia ([place]) 12/19/2021   Abnormal CT of the abdomen 10/01/2021   Polyp of ascending colon 10/01/2021   Chronic cough 08/13/2021   Enteritis 07/14/2020   Rectal bleed 07/14/2020   Paroxysmal atrial fibrillation ([place]) 07/14/2020   Aortic atherosclerosis ([place]) 12/12/2019   Axillary adenopathy 01/01/2019   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[place]) 12/30/2018   Osteopenia of neck of right femur 07/12/2018   Thoracic ascending aortic aneurysm ([place]) 05/27/2018   LPRD (laryngopharyngeal reflux disease) 03/17/2018   Chronic foot pain 11/25/2017   Lumbar degenerative disc disease 09/24/2017   Hx of Lyme disease 09/24/2017   Hx of  basal cell carcinoma 09/24/2017   Nuclear sclerotic cataract of left eye 08/09/2015   Osteoporosis 07/04/2014   Primary localized osteoarthrosis of shoulder region 12/13/2012   Osteoarthritis 12/18/2011   PCP: Glean Hess, MD  REFERRING PROVIDER: Glean Hess, MD  REFERRING DIAGNOSIS: [phone] (ICD-10-CM) - Arthralgia of right temporomandibular joint   THERAPY DIAG: Cervicalgia  RATIONALE FOR EVALUATION AND TREATMENT: Rehabilitation  ONSET DATE: September 2023  FOLLOW UP APPT WITH PROVIDER: Yes   FROM INITIAL EVALUATION SUBJECTIVE:                                                                                                                                                                                          Chief Complaint: R TMJ pain  Pertinent History Pt arrives reporting R sided facial pain since September 2023. No specific trauma at the time of onset. She describes the pain as a "really bad ear ache" localized over her R TMJ, R masseter, and near the angle of the R side of her mandible. She saw Dr. Army Melia and was referred for physical therapy as it was determined that the pain was originating in her R TMJ. She was also referred to ENT but was told by their office that they couldn't see her for this issue. She also went to see [facility] approximately 3 weeks ago and reports she had a clear dental exam with normal imaging. She reports that she is missing an upper and lower tooth on the L side and has a deteriorating bridge in the R lower side.  It was recommended the she see oral surgery for her R TMJ pain but she would prefer to try physical therapy first. The pain is most notable when she rotates her head to the right when sleeping and especially when she swallows with a closed mouth.  It hurts to palpate over her R TMJ. She denies any drainage from either ear and complains of difficulty swallowing however this is a chronic issue. She has a history of previous esophageal dilation however she continues to experience occasional "gagging fits." She had a swallow study and states that she was initially told that the study was normal however her pulmonologist reports some aspiration. History of L sided breast cancer and she sees oconologist this August for the last visit and then will have to follow-up every 5 years. Pt reports that she was told to avoid hard foods however this is not painful for her. She tried both warm compresses and Tylenol without any improvement. She tried to take the Baclofen however it made her feel too drowsy so she did not continue to take. Pt  complains of frequent dry mouth as well as itching in her ears. No history of OSA and  does not wear CPAP.   Precautions: None   Patient Goals: Decrease pain.    OBJECTIVE  INTERVENTION 07/13/22:   SUBJECTIVE: Jaw continues to improve. Compliant with HEP this weekend. No difficulty with mastication of meals. Pt arrives without any jaw pain. Pt consents to repeat dry needling treatment today.    PAIN: 0/10 jaw pain 07/13/22    Trigger Point Dry Needling (TDN), unbilled Education previously performed with patient regarding potential benefit of TDN. Previously eviewed precautions and risks with patient. Pt provided verbal consent to treatment. In supine using clean technique TDN performed to R masseter with 5 sticks at 0.16 x 30 size. Noted local twitch response (LTR) and subsequent tissue lengthening.   Active jaw opening with pin and stretch Rt masseter x15 Jaw lateral glide A/ROM, slightly open 1x15 bilat (symmetrical and pain free)  A/ROM Jaw partial opening, absent protrusion phase A/ROM jaw opening 1x15 c pin and stretch Rt temporalis posterior to temporal artery  Sustained release stretch to Rt medial pterygoid 1x2 minutes   Moist heat pack applied to R side of face and/or neck intermittently in treatment session  Cervical retraction into 3 pillows, chin low 15sec on, 15sec off x5  Seated cervical rotation A/ROM x8 bilat cues for low chin     PATIENT EDUCATION:  Education details: Pt educated throughout session about proper posture and technique with exercises. Improved exercise technique, movement at target joints, use of target muscles after min to mod verbal, visual, tactile cues. Person educated: Patient Education method: Explanation  Education comprehension: verbalized understanding and returned demonstration   HOME EXERCISE PROGRAM: Access Code: [email or web address] URL: [email or web address] Date: 06/29/2022 Prepared by: Roxana Hires  Exercises - Supine Chin Tuck  - 6 x daily - 7 x weekly - 6 reps - 6s hold - Seated Scapular Retraction   - 6 x daily - 7 x weekly - 6 reps - 6s hold - Tongue Clicks TMJ  - 6 x daily - 7 x weekly - 6 reps  Education: Modified head position to achieve targeted stretch    ASSESSMENT:  CLINICAL IMPRESSION: Active trigger points in 2 primary locations of Rt masseter, optimal response to dry needling today which serves as an excellent precursor to myofascial work thereafter. Pt demonstrates good tolerance to A/ROM with perfect symmetry and no exacerbation of jaw pain or other jaw symptoms. HEP maintained as previously issued. Pt encouraged to follow-up as scheduled. She will benefit from skilled PT to address deficits in R sided facial pain in order to improve pain-free function at home.   REHAB POTENTIAL: Good  CLINICAL DECISION MAKING: Unstable/unpredictable  EVALUATION COMPLEXITY: High   GOALS: Goals reviewed with patient? Yes  SHORT TERM GOALS: Target date: 07/21/2022  Pt will be independent with HEP to improve strength and decrease R TMJ pain in order to improve pain-free function at home. Baseline:  Goal status: INITIAL   LONG TERM GOALS: Target date: 08/18/2022  Pt will increase FOTO to at least 54 to demonstrate significant improvement in function at home and work related to neck pain  Baseline: 06/23/22: 50; Goal status: INITIAL  2.  Pt will decrease worst TMJ pain by at least 3 points on the NPRS in order to demonstrate clinically significant reduction in TMJ pain. Baseline: 06/23/22: worst: 7/10; Goal status: INITIAL  3.  Pt will decrease NDI score by at least 19% in order demonstrate clinically  significant reduction in neck pain/disability.       Baseline: 06/23/22: To be completed; 06/25/22: 28% Goal status: INITIAL   PLAN: PT FREQUENCY: 2x/week  PT DURATION: 8 weeks  PLANNED INTERVENTIONS: Therapeutic exercises, Therapeutic activity, Neuromuscular re-education, Balance training, Gait training, Patient/Family education, Joint manipulation, Joint mobilization, Vestibular  training, Canalith repositioning, Dry Needling, Electrical stimulation, Spinal manipulation, Spinal mobilization, Cryotherapy, Moist heat, Taping, Traction, Ultrasound, Ionotophoresis '4mg'$ /ml Dexamethasone, and Manual therapy  PLAN FOR NEXT SESSION: Defer to primary therapist    8:05 AM, 07/13/22 Etta Grandchild, PT, DPT Physical Therapist - [facility] in [place] (Office)     Jatziry Wechter C 07/13/2022, 8:05 AM

## 2022-07-14 NOTE — Therapy (Signed)
OUTPATIENT PHYSICAL THERAPY TREATMENT  Patient Name: Connie Mitchell MRN: [id number] DOB:11-Jan-1934, 89 y.o., female Today's Date: 07/18/2022   PT End of Session - 07/18/22 1612     Visit Number 7    Number of Visits 17    Date for PT Re-Evaluation 08/18/22    Authorization Type Medicare A&B; BCBS Secondary    PT Start Time 1400    PT Stop Time 1445    PT Time Calculation (min) 45 min    Activity Tolerance Patient tolerated treatment well;No increased pain    Behavior During Therapy WFL for tasks assessed/performed            Past Medical History:  Diagnosis Date   A-fib ([place])    Allergy    Anxiety    Cancer ([place]) 1991   left breast. treated with rads and lumpectomy   Cancer ([place]) 2020   newly diagnosed, also left breast   Cataract 2017   Cellulitis and abscess of toe of left foot 01/2018   Dental bridge present    permanent - bottom   GERD (gastroesophageal reflux disease)    Heart murmur 2017   Afib 2022   Hx of therapeutic radiation 1991   left breast   Laryngopharyngeal reflux    sometimes food goes down wrong way and she ends up in extreme coughing/choking scenario   Personal history of radiation therapy 1991   left breast ca   Primary osteoarthritis    Thoracic aortic aneurysm (TAA) ([place])    Past Surgical History:  Procedure Laterality Date   BREAST BIOPSY Right 02/22/2020   stereo bx, ribbon clip, path pending    BREAST LUMPECTOMY Left 1991   breast ca, lumpectomy   cataracts Bilateral    COLONOSCOPY WITH PROPOFOL N/A 04/28/2021   Procedure: COLONOSCOPY WITH BIOPSY;  Surgeon: Lucilla Lame, MD;  Location: [place] [place];  Service: Endoscopy;  Laterality: N/A;   CYSTOCELE REPAIR  2010   EYE SURGERY Bilateral 2017   cataracts extracted   JOINT REPLACEMENT     MASTECTOMY Left 07/25/2018   Invasive Mammary Carcinoma   MASTECTOMY W/ SENTINEL NODE BIOPSY Left 07/25/2018   Procedure: MASTECTOMY WITH SENTINEL LYMPH NODE BIOPSY;  Surgeon: Olean Ree,  MD;  Location: [facility];  Service: General;  Laterality: Left;   OOPHORECTOMY  1995   ovarian mass - benign   POLYPECTOMY N/A 04/28/2021   Procedure: POLYPECTOMY;  Surgeon: Lucilla Lame, MD;  Location: [facility];  Service: Endoscopy;  Laterality: N/A;   REPLACEMENT TOTAL KNEE Bilateral 2012,2013   SKIN BIOPSY Left    arm growth   TONSILLECTOMY  1958   TOTAL SHOULDER ARTHROPLASTY Right 2016   Patient Active Problem List   Diagnosis Date Noted   Arthralgia of right temporomandibular joint 03/27/2022   Acquired thrombophilia ([place]) 12/19/2021   Abnormal CT of the abdomen 10/01/2021   Polyp of ascending colon 10/01/2021   Chronic cough 08/13/2021   Enteritis 07/14/2020   Rectal bleed 07/14/2020   Paroxysmal atrial fibrillation ([place]) 07/14/2020   Aortic atherosclerosis ([place]) 12/12/2019   Axillary adenopathy 01/01/2019   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[place]) 12/30/2018   Osteopenia of neck of right femur 07/12/2018   Thoracic ascending aortic aneurysm ([place]) 05/27/2018   LPRD (laryngopharyngeal reflux disease) 03/17/2018   Chronic foot pain 11/25/2017   Lumbar degenerative disc disease 09/24/2017   Hx of Lyme disease 09/24/2017   Hx of basal cell carcinoma 09/24/2017   Nuclear sclerotic cataract of left eye 08/09/2015   Osteoporosis  07/04/2014   Primary localized osteoarthrosis of shoulder region 12/13/2012   Osteoarthritis 12/18/2011   PCP: Glean Hess, MD  REFERRING PROVIDER: Glean Hess, MD  REFERRING DIAGNOSIS: [phone] (ICD-10-CM) - Arthralgia of right temporomandibular joint   THERAPY DIAG: Cervicalgia  RATIONALE FOR EVALUATION AND TREATMENT: Rehabilitation  ONSET DATE: September 2023  FOLLOW UP APPT WITH PROVIDER: Yes   FROM INITIAL EVALUATION SUBJECTIVE:                                                                                                                                                                                         Chief Complaint: R  TMJ pain  Pertinent History Pt arrives reporting R sided facial pain since September 2023. No specific trauma at the time of onset. She describes the pain as a "really bad ear ache" localized over her R TMJ, R masseter, and near the angle of the R side of her mandible. She saw Dr. Army Melia and was referred for physical therapy as it was determined that the pain was originating in her R TMJ. She was also referred to ENT but was told by their office that they couldn't see her for this issue. She also went to see [facility] approximately 3 weeks ago and reports she had a clear dental exam with normal imaging. She reports that she is missing an upper and lower tooth on the L side and has a deteriorating bridge in the R lower side.  It was recommended the she see oral surgery for her R TMJ pain but she would prefer to try physical therapy first. The pain is most notable when she rotates her head to the right when sleeping and especially when she swallows with a closed mouth.  It hurts to palpate over her R TMJ. She denies any drainage from either ear and complains of difficulty swallowing however this is a chronic issue. She has a history of previous esophageal dilation however she continues to experience occasional "gagging fits." She had a swallow study and states that she was initially told that the study was normal however her pulmonologist reports some aspiration. History of L sided breast cancer and she sees oconologist this August for the last visit and then will have to follow-up every 5 years. Pt reports that she was told to avoid hard foods however this is not painful for her. She tried both warm compresses and Tylenol without any improvement. She tried to take the Baclofen however it made her feel too drowsy so she did not continue to take. Pt complains of frequent dry mouth as well as itching in her ears. No history of OSA  and does not wear CPAP.  Last Dental Examination and imaging: [facility]  approximately 3 weeks ago.  Recent dental procedures or orthodontics: No Pain location: Worse is in the R TMJ but pt also gets pain in across the zygomatic process and under the angle of the right mandible Pain Severity: Present: 1/10, Best: 1/10, Worst: 7/10 Pain quality: throbbing ache in jaw but sharp pain in ear  Radiating pain: Radiates around the R side of her face Numbness/Tingling: No Aggravating factors: Swallowing with a closed mouth, rotating the head to the right when sleeping Easing factors: sleeping, no help with self-massage, no help with Tylenol, tried Baclofen but didn't like how it made her feel (drowsy) Clicking, catching, or crepitus during chewing: No 24 hour pain behavior: Sometimes worse when first waking up but not consistently History of grinding teeth: Yes Recent or remote jaw/face/neck trauma, injury, or pain: No Falls: Has patient fallen in last 6 months? No, History of prior physical therapy for this issue: No  Imaging: Yes, see history Progression (improving, worsening, unchanged): gradual worsening but today is a good day History of headaches/migraines: Yes, history of chronic headaches (occur frequently), never formally diagnosed with migraines. Headaches multiple times per week (frontal region around temples). First bout of floaters in August after laser eye surgery but not currently. Photophobia with headaches but no phonophobia or sensitivity to smell. No nausea/vomiting with headaches Ear symptoms (tinnitus, fullness, pain): Yes, "insect noises" in both ears. Intermittent bilateral ear pain but mostly on the right side. Frequent ear itching. Pt reports that she "needs hearing aides" but doesn't want to get them until she gets the itching resolved; Chest pain: Yes, intermittent L sided chest pain occasionally.  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 Denies chills/fever, night sweats, nausea, vomiting, unexplained weight gain/loss;  Precautions: None  Weight Bearing Restrictions: No  Living Environment Lives with: lives with business partner Lives in: House/apartment, single story, 4 steps to enter/exit, no rails. Uses rollator on steps and holds flag post. Equipment: Rollator (2 different ones, one low and one upright), BSC, walk-in shower with bench, no grab bars  Patient Goals: Decrease pain.    OBJECTIVE  Patient Surveys  NDI To be completed FOTO 50, predicted improvement to 34  Mental Status Patient is oriented to person, place and time.  Recent memory is intact.  Remote memory is intact.  Attention span and concentration are intact.  Expressive speech is intact.  Patient's fund of knowledge is within normal limits for educational level.  Cranial Nerves Visual acuity and visual fields are intact  Extraocular muscles are intact  Facial sensation is intact bilaterally  Facial strength is intact bilaterally  Hearing is normal as tested by gross conversation however pt reports some loss of hearing Palate elevates midline, normal phonation  Shoulder shrug strength is intact  Tongue protrudes midline   MUSCULOSKELETAL: Tremor: None Bulk: Normal Tone: Normal Facial Symmetry: Face appears grossly symmetrical  Posture Moderate to severe upper thoracic kyphosis with forward head/neck posture  Cervical Screen Centralization: Deferred Isometrics: Full and painless in all directions Passive Accessory Intervertebral Motion (PAIVM), central and unilateral (bilaterally):  Deferred Spurlings A (ipsilateral lateral  flexion/axial compression): R: Not examined L: Not examined Spurlings B (ipsilateral lateral flexion/contralateral rotation/axial compression): R: Not examined L: Not examined Cervical Distraction Test: Not examined  Cervical Fexion-Rotation Test: Not examined  Hoffman Sign (cervical cord compression): R: Not examined L: Not examined  AROM AROM (Normal range in degrees) AROM  Cervical  Flexion (50) 50* (L upper trap pain)  Extension (80) 30  Right lateral flexion (45) 18* (L sided neck pain)  Left lateral flexion (45) 18* (R sided neck pain)  Right rotation (85) 54  Left rotation (85) 44  (* = pain; Blank rows = not tested)  Dermatome/Myotome Screen N=normal  Ab=abnormal Level Dermatome R L Myotome R L Reflex R L  C2 Posterior Scalp N N Cervical Flexion/Extension C1-2 N N Jaw CN V    C3 Anterior Neck N N Cervical Sidebend C2-3 N N Biceps C5-6    C4 Top of Shoulder N N Shoulder Shrug C4 N N Brachiorad. C5-6    C5 Lateral Upper Arm N N Shoulder ABD C4-5 N N Triceps C7    C6 Lateral Arm/ Thumb N N Arm Flex/ Wrist Ext C5-6 N N     C7 Middle Finger N N Arm Ext//Wrist Flex C6-7 N N     C8 4th & 5th Finger N N Flex/ Ext Carpi Ulnaris C8 N N     T1 Medial Arm N N Interossei T1 N N      Sensation Grossly intact to light touch bilateral face and UE as determined by testing branches of trigeminal nerve and dermatomes C2-T2  Palpation Graded on 0-4 scale (0 = no pain, 1 = pain, 2 = pain with wincing/grimacing/flinching, 3 = pain with withdrawal, 4 = unwilling to allow palpation) Location LEFT  RIGHT           Temporomandibular Joint (posterior, superior, anterior)  1  Temporalis (anterior, middle, posterior fibers)    Temporalis Tendon Insertion  1  Masseter (Zygoma, Body, Lateral surface of angle of mandible)  1  Medial ptyergoid  1  Frontal Sinus    Maxillary Sinus    SCM    Upper Trapezius  1  Subocciptials  1   Mandibular AROM Resting Dental Alignment/Occlusion (Overbite,  underbite, overjet, crossbite): Overbite noted with the R upper molars, crossbite with lower incisors noted to be approximately 28m to the right of the upper incisors.   Mandible Depression (40-552m: 5373mandible Protrusion (3-6mm21m3mm 34mdible Lateral Excursion (10-12mm)42m 8mm  L64m0mm   64mngth R/L Functional Mandible Depression (Jaw Opening): WNL, mild pain Functional Mandible Protrusion: WNL Functional Mandible Elevation (Jaw Closing): WNL Functional Mandible Lateral Deviation: WNL bilaterally  Passive Accessory Joint Motion (PAIVM) (Right) Inferior (Caudal Glide): WNL Anterior Glide: WNL Medial Glide: WNL Lateral Glide: Limited and painful Medial and Lateral Extra-oral Glide: R lateral glide is limited and painful. No issues with lateral glide  Special Tests Biting on one separator (ipsilateral muscle activation/contralateral joint compression): R: Negative L: Positive for R sided TMJ pain; Biting on two separators (bilateral muscle activation): R: Not examined L: Not examined Manual Joint Compression: Positive on the R for pain; Manual Joint Distraction: Negative  Beighton scale: Deferred  MMT  MMT (out of 5) Right 06/23/2022 Left 06/23/2022  Cervical (isometric)  Flexion WNL  Extension WNL  Lateral Flexion WNL WNL  Rotation WNL WNL      Shoulder   Flexion 4+ 4+  Extension    Abduction 4+ 4+  Internal rotation    Horizontal abduction    Horizontal adduction    Lower Trapezius    Rhomboids        Elbow  Flexion 5 5  Extension 5 5  Pronation    Supination        Wrist  Flexion 5 5  Extension 5 5  Radial deviation    Ulnar deviation        MCP  Flexion 5 5  Extension 5 5  Abduction 5 5  Adduction 5 5  (* = pain; Blank rows = not tested)  Reflexes Deferred    SUBJECTIVE: Jaw continues to improve especially for the days following her PT appointments. She has started to have a slight return of her jaw pain over the last couple days. Compliant  with HEP. She has been having some continued neck pain as well. No specific questions currently.    PAIN: 2/10 R TMJ pain;   TREATMENT   Trigger Point Dry Needling (TDN), unbilled Education previously performed with patient regarding potential benefit of TDN. Previously eviewed precautions and risks with patient. Pt provided verbal consent to treatment. In supine using clean technique TDN performed to R masseter with 2, 0.16 x 30 single needle placements with local twitch response (LTR). Also performed 1 additional 0.16 x 30 single needle placement in R lateral pterygoid and 2, 0.16 x 30 single needle placements to R temporalis. Pistoning technique utilized.    Manual Therapy  Moist heat pack applied to R side of face during interval history x 5 minutes; STM to R masseter (extraoral), R SCM, R suboccipitals and at the submandibular region near the mandibular angle to target medial ptyergoid; Supine CPA C2-C3, grade II, 20s/bout x 2 bouts/level; Gentle cervical traction 15s hold/15s relax x 2; Lateral flexion and upper trap stretches x 30s each bilateral;   Ther-ex  Supine repeated cervical retraction with manual overpressure by therapist x 10; Supine cervical rotation and lateral flexion with manual resistance x 5 each direction bilaterally; Mandibular lateral deviation isometrics 5s contract/5s relax x 5 bilaterally; Mandibular depression isometrics 5s contract/5s relax x 5; Mandibular elevation isometrics 5s contract/5s relax x 5;    PATIENT EDUCATION:  Education details: Pt educated throughout session about proper posture and technique with exercises. Improved exercise technique, movement at target joints, use of target muscles after min to mod verbal, visual, tactile cues. Person educated: Patient Education method: Explanation  Education comprehension: verbalized understanding and returned demonstration   HOME EXERCISE PROGRAM: Access Code: [email or web address] URL:  [email or web address] Date: 06/29/2022 Prepared by: Roxana Hires  Exercises - Supine Chin Tuck  - 6 x daily - 7 x weekly - 6 reps - 6s hold - Seated Scapular Retraction  - 6 x daily - 7 x weekly - 6 reps - 6s hold - Tongue Clicks TMJ  - 6 x daily - 7 x weekly - 6 reps  Education: Modified head position to achieve targeted stretch    ASSESSMENT:  CLINICAL IMPRESSION: Pt reporting continued improvement in pain/function after therapy sessions. Continued manual techniques including STM. Repeated dry needling to R masseter and R lateral ptyergoid but also added R temporalis. Continued strengthening during session today. Plan to continue to progress strengthening at future sessions. Pt encouraged to follow-up as scheduled. She will benefit from skilled PT to address deficits in R sided facial pain in order to improve pain-free function at home.   REHAB POTENTIAL: Good  CLINICAL DECISION MAKING: Unstable/unpredictable  EVALUATION COMPLEXITY: High  GOALS: Goals reviewed with patient? Yes  SHORT TERM GOALS: Target date: 07/21/2022  Pt will be independent with HEP to improve strength and decrease R TMJ pain in order to improve pain-free function at home. Baseline:  Goal status: INITIAL   LONG TERM GOALS: Target date: 08/18/2022  Pt will increase FOTO to at least 54 to demonstrate significant improvement in function at home and work related to neck pain  Baseline: 06/23/22: 50; Goal status: INITIAL  2.  Pt will decrease worst TMJ pain by at least 3 points on the NPRS in order to demonstrate clinically significant reduction in TMJ pain. Baseline: 06/23/22: worst: 7/10; Goal status: INITIAL  3.  Pt will decrease NDI score by at least 19% in order demonstrate clinically significant reduction in neck pain/disability.       Baseline: 06/23/22: To be completed; 06/25/22: 28% Goal status: INITIAL   PLAN: PT FREQUENCY: 2x/week  PT DURATION: 8 weeks  PLANNED INTERVENTIONS:  Therapeutic exercises, Therapeutic activity, Neuromuscular re-education, Balance training, Gait training, Patient/Family education, Joint manipulation, Joint mobilization, Vestibular training, Canalith repositioning, Dry Needling, Electrical stimulation, Spinal manipulation, Spinal mobilization, Cryotherapy, Moist heat, Taping, Traction, Ultrasound, Ionotophoresis '4mg'$ /ml Dexamethasone, and Manual therapy  PLAN FOR NEXT SESSION: Progress strengthening and manual techniques as needed;  Lyndel Safe Aune Adami PT, DPT, GCS  Arieon Scalzo 07/18/2022, 4:14 PM

## 2022-07-16 ENCOUNTER — Ambulatory Visit: Payer: Medicare Other | Attending: Internal Medicine

## 2022-07-16 DIAGNOSIS — M542 Cervicalgia: Secondary | ICD-10-CM | POA: Insufficient documentation

## 2022-07-18 ENCOUNTER — Encounter: Payer: Self-pay | Admitting: Pulmonary Disease

## 2022-07-18 NOTE — Therapy (Signed)
OUTPATIENT PHYSICAL THERAPY TREATMENT  Patient Name: Connie Mitchell MRN: [id number] DOB:September 05, 1933, 89 y.o., female Today's Date: 07/20/2022   PT End of Session - 07/20/22 1002     Visit Number 8    Number of Visits 17    Date for PT Re-Evaluation 08/18/22    Authorization Type Medicare A&B; BCBS Secondary    PT Start Time 6073    PT Stop Time 1100    PT Time Calculation (min) 45 min    Activity Tolerance Patient tolerated treatment well;No increased pain    Behavior During Therapy WFL for tasks assessed/performed            Past Medical History:  Diagnosis Date   A-fib ([place])    Allergy    Anxiety    Cancer ([place]) 1991   left breast. treated with rads and lumpectomy   Cancer ([place]) 2020   newly diagnosed, also left breast   Cataract 2017   Cellulitis and abscess of toe of left foot 01/2018   Dental bridge present    permanent - bottom   GERD (gastroesophageal reflux disease)    Heart murmur 2017   Afib 2022   Hx of therapeutic radiation 1991   left breast   Laryngopharyngeal reflux    sometimes food goes down wrong way and she ends up in extreme coughing/choking scenario   Personal history of radiation therapy 1991   left breast ca   Primary osteoarthritis    Thoracic aortic aneurysm (TAA) ([place])    Past Surgical History:  Procedure Laterality Date   BREAST BIOPSY Right 02/22/2020   stereo bx, ribbon clip, path pending    BREAST LUMPECTOMY Left 1991   breast ca, lumpectomy   cataracts Bilateral    COLONOSCOPY WITH PROPOFOL N/A 04/28/2021   Procedure: COLONOSCOPY WITH BIOPSY;  Surgeon: Lucilla Lame, MD;  Location: [facility];  Service: Endoscopy;  Laterality: N/A;   CYSTOCELE REPAIR  2010   EYE SURGERY Bilateral 2017   cataracts extracted   JOINT REPLACEMENT     MASTECTOMY Left 07/25/2018   Invasive Mammary Carcinoma   MASTECTOMY W/ SENTINEL NODE BIOPSY Left 07/25/2018   Procedure: MASTECTOMY WITH SENTINEL LYMPH NODE BIOPSY;  Surgeon: Olean Ree,  MD;  Location: [facility];  Service: General;  Laterality: Left;   OOPHORECTOMY  1995   ovarian mass - benign   POLYPECTOMY N/A 04/28/2021   Procedure: POLYPECTOMY;  Surgeon: Lucilla Lame, MD;  Location: [facility];  Service: Endoscopy;  Laterality: N/A;   REPLACEMENT TOTAL KNEE Bilateral 2012,2013   SKIN BIOPSY Left    arm growth   TONSILLECTOMY  1958   TOTAL SHOULDER ARTHROPLASTY Right 2016   Patient Active Problem List   Diagnosis Date Noted   Arthralgia of right temporomandibular joint 03/27/2022   Acquired thrombophilia ([place]) 12/19/2021   Abnormal CT of the abdomen 10/01/2021   Polyp of ascending colon 10/01/2021   Chronic cough 08/13/2021   Enteritis 07/14/2020   Rectal bleed 07/14/2020   Paroxysmal atrial fibrillation ([place]) 07/14/2020   Aortic atherosclerosis ([place]) 12/12/2019   Axillary adenopathy 01/01/2019   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[place]) 12/30/2018   Osteopenia of neck of right femur 07/12/2018   Thoracic ascending aortic aneurysm ([place]) 05/27/2018   LPRD (laryngopharyngeal reflux disease) 03/17/2018   Chronic foot pain 11/25/2017   Lumbar degenerative disc disease 09/24/2017   Hx of Lyme disease 09/24/2017   Hx of basal cell carcinoma 09/24/2017   Nuclear sclerotic cataract of left eye 08/09/2015   Osteoporosis  07/04/2014   Primary localized osteoarthrosis of shoulder region 12/13/2012   Osteoarthritis 12/18/2011   PCP: Glean Hess, MD  REFERRING PROVIDER: Glean Hess, MD  REFERRING DIAGNOSIS: [phone] (ICD-10-CM) - Arthralgia of right temporomandibular joint   THERAPY DIAG: Cervicalgia  RATIONALE FOR EVALUATION AND TREATMENT: Rehabilitation  ONSET DATE: September 2023  FOLLOW UP APPT WITH PROVIDER: Yes   FROM INITIAL EVALUATION SUBJECTIVE:                                                                                                                                                                                         Chief Complaint: R  TMJ pain  Pertinent History Pt arrives reporting R sided facial pain since September 2023. No specific trauma at the time of onset. She describes the pain as a "really bad ear ache" localized over her R TMJ, R masseter, and near the angle of the R side of her mandible. She saw Dr. Army Melia and was referred for physical therapy as it was determined that the pain was originating in her R TMJ. She was also referred to ENT but was told by their office that they couldn't see her for this issue. She also went to see [facility] approximately 3 weeks ago and reports she had a clear dental exam with normal imaging. She reports that she is missing an upper and lower tooth on the L side and has a deteriorating bridge in the R lower side.  It was recommended the she see oral surgery for her R TMJ pain but she would prefer to try physical therapy first. The pain is most notable when she rotates her head to the right when sleeping and especially when she swallows with a closed mouth.  It hurts to palpate over her R TMJ. She denies any drainage from either ear and complains of difficulty swallowing however this is a chronic issue. She has a history of previous esophageal dilation however she continues to experience occasional "gagging fits." She had a swallow study and states that she was initially told that the study was normal however her pulmonologist reports some aspiration. History of L sided breast cancer and she sees oconologist this August for the last visit and then will have to follow-up every 5 years. Pt reports that she was told to avoid hard foods however this is not painful for her. She tried both warm compresses and Tylenol without any improvement. She tried to take the Baclofen however it made her feel too drowsy so she did not continue to take. Pt complains of frequent dry mouth as well as itching in her ears. No history of OSA  and does not wear CPAP.  Last Dental Examination and imaging: [facility]  approximately 3 weeks ago.  Recent dental procedures or orthodontics: No Pain location: Worse is in the R TMJ but pt also gets pain in across the zygomatic process and under the angle of the right mandible Pain Severity: Present: 1/10, Best: 1/10, Worst: 7/10 Pain quality: throbbing ache in jaw but sharp pain in ear  Radiating pain: Radiates around the R side of her face Numbness/Tingling: No Aggravating factors: Swallowing with a closed mouth, rotating the head to the right when sleeping Easing factors: sleeping, no help with self-massage, no help with Tylenol, tried Baclofen but didn't like how it made her feel (drowsy) Clicking, catching, or crepitus during chewing: No 24 hour pain behavior: Sometimes worse when first waking up but not consistently History of grinding teeth: Yes Recent or remote jaw/face/neck trauma, injury, or pain: No Falls: Has patient fallen in last 6 months? No, History of prior physical therapy for this issue: No  Imaging: Yes, see history Progression (improving, worsening, unchanged): gradual worsening but today is a good day History of headaches/migraines: Yes, history of chronic headaches (occur frequently), never formally diagnosed with migraines. Headaches multiple times per week (frontal region around temples). First bout of floaters in August after laser eye surgery but not currently. Photophobia with headaches but no phonophobia or sensitivity to smell. No nausea/vomiting with headaches Ear symptoms (tinnitus, fullness, pain): Yes, "insect noises" in both ears. Intermittent bilateral ear pain but mostly on the right side. Frequent ear itching. Pt reports that she "needs hearing aides" but doesn't want to get them until she gets the itching resolved; Chest pain: Yes, intermittent L sided chest pain occasionally.  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 Denies chills/fever, night sweats, nausea, vomiting, unexplained weight gain/loss;  Precautions: None  Weight Bearing Restrictions: No  Living Environment Lives with: lives with business partner Lives in: House/apartment, single story, 4 steps to enter/exit, no rails. Uses rollator on steps and holds flag post. Equipment: Rollator (2 different ones, one low and one upright), BSC, walk-in shower with bench, no grab bars  Patient Goals: Decrease pain.    OBJECTIVE  Patient Surveys  NDI To be completed FOTO 50, predicted improvement to 30  Mental Status Patient is oriented to person, place and time.  Recent memory is intact.  Remote memory is intact.  Attention span and concentration are intact.  Expressive speech is intact.  Patient's fund of knowledge is within normal limits for educational level.  Cranial Nerves Visual acuity and visual fields are intact  Extraocular muscles are intact  Facial sensation is intact bilaterally  Facial strength is intact bilaterally  Hearing is normal as tested by gross conversation however pt reports some loss of hearing Palate elevates midline, normal phonation  Shoulder shrug strength is intact  Tongue protrudes midline   MUSCULOSKELETAL: Tremor: None Bulk: Normal Tone: Normal Facial Symmetry: Face appears grossly symmetrical  Posture Moderate to severe upper thoracic kyphosis with forward head/neck posture  Cervical Screen Centralization: Deferred Isometrics: Full and painless in all directions Passive Accessory Intervertebral Motion (PAIVM), central and unilateral (bilaterally):  Deferred Spurlings A (ipsilateral lateral  flexion/axial compression): R: Not examined L: Not examined Spurlings B (ipsilateral lateral flexion/contralateral rotation/axial compression): R: Not examined L: Not examined Cervical Distraction Test: Not examined  Cervical Fexion-Rotation Test: Not examined  Hoffman Sign (cervical cord compression): R: Not examined L: Not examined  AROM AROM (Normal range in degrees) AROM  Cervical  Flexion (50) 50* (L upper trap pain)  Extension (80) 30  Right lateral flexion (45) 18* (L sided neck pain)  Left lateral flexion (45) 18* (R sided neck pain)  Right rotation (85) 54  Left rotation (85) 44  (* = pain; Blank rows = not tested)  Dermatome/Myotome Screen N=normal  Ab=abnormal Level Dermatome R L Myotome R L Reflex R L  C2 Posterior Scalp N N Cervical Flexion/Extension C1-2 N N Jaw CN V    C3 Anterior Neck N N Cervical Sidebend C2-3 N N Biceps C5-6    C4 Top of Shoulder N N Shoulder Shrug C4 N N Brachiorad. C5-6    C5 Lateral Upper Arm N N Shoulder ABD C4-5 N N Triceps C7    C6 Lateral Arm/ Thumb N N Arm Flex/ Wrist Ext C5-6 N N     C7 Middle Finger N N Arm Ext//Wrist Flex C6-7 N N     C8 4th & 5th Finger N N Flex/ Ext Carpi Ulnaris C8 N N     T1 Medial Arm N N Interossei T1 N N      Sensation Grossly intact to light touch bilateral face and UE as determined by testing branches of trigeminal nerve and dermatomes C2-T2  Palpation Graded on 0-4 scale (0 = no pain, 1 = pain, 2 = pain with wincing/grimacing/flinching, 3 = pain with withdrawal, 4 = unwilling to allow palpation) Location LEFT  RIGHT           Temporomandibular Joint (posterior, superior, anterior)  1  Temporalis (anterior, middle, posterior fibers)    Temporalis Tendon Insertion  1  Masseter (Zygoma, Body, Lateral surface of angle of mandible)  1  Medial ptyergoid  1  Frontal Sinus    Maxillary Sinus    SCM    Upper Trapezius  1  Subocciptials  1   Mandibular AROM Resting Dental Alignment/Occlusion (Overbite,  underbite, overjet, crossbite): Overbite noted with the R upper molars, crossbite with lower incisors noted to be approximately 33m to the right of the upper incisors.   Mandible Depression (40-534m: 5387mandible Protrusion (3-6mm61m3mm 14mdible Lateral Excursion (10-12mm)47m 8mm  L27m0mm   75mngth R/L Functional Mandible Depression (Jaw Opening): WNL, mild pain Functional Mandible Protrusion: WNL Functional Mandible Elevation (Jaw Closing): WNL Functional Mandible Lateral Deviation: WNL bilaterally  Passive Accessory Joint Motion (PAIVM) (Right) Inferior (Caudal Glide): WNL Anterior Glide: WNL Medial Glide: WNL Lateral Glide: Limited and painful Medial and Lateral Extra-oral Glide: R lateral glide is limited and painful. No issues with lateral glide  Special Tests Biting on one separator (ipsilateral muscle activation/contralateral joint compression): R: Negative L: Positive for R sided TMJ pain; Biting on two separators (bilateral muscle activation): R: Not examined L: Not examined Manual Joint Compression: Positive on the R for pain; Manual Joint Distraction: Negative  Beighton scale: Deferred  MMT  MMT (out of 5) Right 06/23/2022 Left 06/23/2022  Cervical (isometric)  Flexion WNL  Extension WNL  Lateral Flexion WNL WNL  Rotation WNL WNL      Shoulder   Flexion 4+ 4+  Extension    Abduction 4+ 4+  Internal rotation    Horizontal abduction    Horizontal adduction    Lower Trapezius    Rhomboids        Elbow  Flexion 5 5  Extension 5 5  Pronation    Supination        Wrist  Flexion 5 5  Extension 5 5  Radial deviation    Ulnar deviation        MCP  Flexion 5 5  Extension 5 5  Abduction 5 5  Adduction 5 5  (* = pain; Blank rows = not tested)  Reflexes Deferred    SUBJECTIVE: Pt reports continued fluctuation of TMJ and neck pain however overall her TMJ pain is much better compared to before starting therapy. She no longer has the feeling of  fullness/swelling in her R cheek. She denies any resting neck or TMJ pain upon arrival today.  Compliant with HEP. No specific questions currently.    PAIN: Denies   TREATMENT   Trigger Point Dry Needling (TDN), unbilled Education previously performed with patient regarding potential benefit of TDN. Previously eviewed precautions and risks with patient. Pt provided verbal consent to treatment. In supine using clean technique TDN performed to R masseter with 3, 0.16 x 30 single needle placements with local twitch response (LTR). Also performed 1 additional 0.16 x 30 single needle placement in R lateral pterygoid. Pistoning technique utilized.    Manual Therapy  Moist heat pack applied to R side of face during interval history x 5 minutes; STM to R masseter (extraoral), R medial ptyergoid (intraoral), R SCM, R suboccipitals and at the submandibular region near the mandibular angle to target medial ptyergoid, utilized long hold trigger point release in R masseter; Supine CPA C2-C3, grade II, 20s/bout x 2 bouts/level; Gentle cervical traction 15s hold/15s relax x 2; Lateral flexion and upper trap stretches x 30s each bilateral; Suboccipital release x 2 minutes; Suboccipital stretch 2 x 30s;   Ther-ex  Supine repeated cervical retraction with manual overpressure by therapist x 10; Supine cervical rotation and lateral flexion with manual resistance x 10 each direction bilaterally;   Not performed: Mandibular lateral deviation isometrics 5s contract/5s relax x 5 bilaterally; Mandibular depression isometrics 5s contract/5s relax x 5; Mandibular elevation isometrics 5s contract/5s relax x 5;    PATIENT EDUCATION:  Education details: Pt educated throughout session about proper posture and technique with exercises. Improved exercise technique, movement at target joints, use of target muscles after min to mod verbal, visual, tactile cues. Person educated: Patient Education method:  Explanation  Education comprehension: verbalized understanding and returned demonstration   HOME EXERCISE PROGRAM: Access Code: [email or web address] URL: [email or web address] Date: 06/29/2022 Prepared by: Roxana Hires  Exercises - Supine Chin Tuck  - 6 x daily - 7 x weekly - 6 reps - 6s hold - Seated Scapular Retraction  - 6 x daily - 7 x weekly - 6 reps - 6s hold - Tongue Clicks TMJ  - 6 x daily - 7 x weekly - 6 reps  Education: Modified head position to achieve targeted stretch    ASSESSMENT:  CLINICAL IMPRESSION: Pt reporting continued improvement in pain/function after therapy sessions. Continued manual techniques including STM. Repeated dry needling to R masseter and R lateral ptyergoid. Continued strengthening during session today. Plan to continue to progress strengthening at future sessions. Pt encouraged to follow-up as scheduled. She will benefit from skilled PT to address deficits in R sided facial pain in order to improve pain-free function at home.  REHAB POTENTIAL: Good  CLINICAL DECISION MAKING: Unstable/unpredictable  EVALUATION COMPLEXITY: High   GOALS: Goals reviewed with patient? Yes  SHORT TERM GOALS: Target date: 07/21/2022  Pt will be independent with HEP to improve strength and decrease R TMJ pain in order to improve pain-free function at home. Baseline:  Goal status: INITIAL   LONG TERM GOALS: Target date: 08/18/2022  Pt will increase FOTO to at least 54 to demonstrate significant improvement in function at home and work related to neck pain  Baseline: 06/23/22: 50; Goal status: INITIAL  2.  Pt will decrease worst TMJ pain by at least 3 points on the NPRS in order to demonstrate clinically significant reduction in TMJ pain. Baseline: 06/23/22: worst: 7/10; Goal status: INITIAL  3.  Pt will decrease NDI score by at least 19% in order demonstrate clinically significant reduction in neck pain/disability.       Baseline: 06/23/22: To be completed;  06/25/22: 28% Goal status: INITIAL   PLAN: PT FREQUENCY: 2x/week  PT DURATION: 8 weeks  PLANNED INTERVENTIONS: Therapeutic exercises, Therapeutic activity, Neuromuscular re-education, Balance training, Gait training, Patient/Family education, Joint manipulation, Joint mobilization, Vestibular training, Canalith repositioning, Dry Needling, Electrical stimulation, Spinal manipulation, Spinal mobilization, Cryotherapy, Moist heat, Taping, Traction, Ultrasound, Ionotophoresis '4mg'$ /ml Dexamethasone, and Manual therapy  PLAN FOR NEXT SESSION: Progress strengthening and manual techniques as needed;  Lyndel Safe Narmeen Kerper PT, DPT, GCS  Cyriah Childrey 07/20/2022, 9:14 PM

## 2022-07-20 ENCOUNTER — Ambulatory Visit: Payer: Medicare Other

## 2022-07-20 DIAGNOSIS — M542 Cervicalgia: Secondary | ICD-10-CM

## 2022-07-22 ENCOUNTER — Ambulatory Visit: Payer: Medicare Other

## 2022-07-22 DIAGNOSIS — M542 Cervicalgia: Secondary | ICD-10-CM | POA: Diagnosis not present

## 2022-07-22 NOTE — Therapy (Signed)
OUTPATIENT PHYSICAL THERAPY TREATMENT  Patient Name: Connie Mitchell MRN: [id number] DOB:03-10-1934, 89 y.o., female Today's Date: 07/23/2022   PT End of Session - 07/23/22 1059     Visit Number 9    Number of Visits 17    Date for PT Re-Evaluation 08/18/22    Authorization Type Medicare A&B; BCBS Secondary    PT Start Time 1400    PT Stop Time 1445    PT Time Calculation (min) 45 min    Activity Tolerance Patient tolerated treatment well;No increased pain    Behavior During Therapy WFL for tasks assessed/performed            Past Medical History:  Diagnosis Date   A-fib (HCC)    Allergy    Anxiety    Cancer (HCC) 1991   left breast. treated with rads and lumpectomy   Cancer (HCC) 2020   newly diagnosed, also left breast   Cataract 2017   Cellulitis and abscess of toe of left foot 01/2018   Dental bridge present    permanent - bottom   GERD (gastroesophageal reflux disease)    Heart murmur 2017   Afib 2022   Hx of therapeutic radiation 1991   left breast   Laryngopharyngeal reflux    sometimes food goes down wrong way and she ends up in extreme coughing/choking scenario   Personal history of radiation therapy 1991   left breast ca   Primary osteoarthritis    Thoracic aortic aneurysm (TAA) (HCC)    Past Surgical History:  Procedure Laterality Date   BREAST BIOPSY Right 02/22/2020   stereo bx, ribbon clip, path pending    BREAST LUMPECTOMY Left 1991   breast ca, lumpectomy   cataracts Bilateral    COLONOSCOPY WITH PROPOFOL N/A 04/28/2021   Procedure: COLONOSCOPY WITH BIOPSY;  Surgeon: Midge Minium, MD;  Location: [facility];  Service: Endoscopy;  Laterality: N/A;   CYSTOCELE REPAIR  2010   EYE SURGERY Bilateral 2017   cataracts extracted   JOINT REPLACEMENT     MASTECTOMY Left 07/25/2018   Invasive Mammary Carcinoma   MASTECTOMY W/ SENTINEL NODE BIOPSY Left 07/25/2018   Procedure: MASTECTOMY WITH SENTINEL LYMPH NODE BIOPSY;  Surgeon: Henrene Dodge,  MD;  Location: [facility];  Service: General;  Laterality: Left;   OOPHORECTOMY  1995   ovarian mass - benign   POLYPECTOMY N/A 04/28/2021   Procedure: POLYPECTOMY;  Surgeon: Midge Minium, MD;  Location: [facility];  Service: Endoscopy;  Laterality: N/A;   REPLACEMENT TOTAL KNEE Bilateral 2012,2013   SKIN BIOPSY Left    arm growth   TONSILLECTOMY  1958   TOTAL SHOULDER ARTHROPLASTY Right 2016   Patient Active Problem List   Diagnosis Date Noted   Arthralgia of right temporomandibular joint 03/27/2022   Acquired thrombophilia (HCC) 12/19/2021   Abnormal CT of the abdomen 10/01/2021   Polyp of ascending colon 10/01/2021   Chronic cough 08/13/2021   Enteritis 07/14/2020   Rectal bleed 07/14/2020   Paroxysmal atrial fibrillation (HCC) 07/14/2020   Aortic atherosclerosis (HCC) 12/12/2019   Axillary adenopathy 01/01/2019   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HCC) 12/30/2018   Osteopenia of neck of right femur 07/12/2018   Thoracic ascending aortic aneurysm (HCC) 05/27/2018   LPRD (laryngopharyngeal reflux disease) 03/17/2018   Chronic foot pain 11/25/2017   Lumbar degenerative disc disease 09/24/2017   Hx of Lyme disease 09/24/2017   Hx of basal cell carcinoma 09/24/2017   Nuclear sclerotic cataract of left eye 08/09/2015   Osteoporosis  07/04/2014   Primary localized osteoarthrosis of shoulder region 12/13/2012   Osteoarthritis 12/18/2011   PCP: Reubin Milan, MD  REFERRING PROVIDER: Reubin Milan, MD  REFERRING DIAGNOSIS: [phone] (ICD-10-CM) - Arthralgia of right temporomandibular joint   THERAPY DIAG: Cervicalgia  RATIONALE FOR EVALUATION AND TREATMENT: Rehabilitation  ONSET DATE: September 2023  FOLLOW UP APPT WITH PROVIDER: Yes   FROM INITIAL EVALUATION SUBJECTIVE:                                                                                                                                                                                         Chief Complaint: R  TMJ pain  Pertinent History Pt arrives reporting R sided facial pain since September 2023. No specific trauma at the time of onset. She describes the pain as a "really bad ear ache" localized over her R TMJ, R masseter, and near the angle of the R side of her mandible. She saw Dr. Judithann Graves and was referred for physical therapy as it was determined that the pain was originating in her R TMJ. She was also referred to ENT but was told by their office that they couldn't see her for this issue. She also went to see [facility] approximately 3 weeks ago and reports she had a clear dental exam with normal imaging. She reports that she is missing an upper and lower tooth on the L side and has a deteriorating bridge in the R lower side.  It was recommended the she see oral surgery for her R TMJ pain but she would prefer to try physical therapy first. The pain is most notable when she rotates her head to the right when sleeping and especially when she swallows with a closed mouth.  It hurts to palpate over her R TMJ. She denies any drainage from either ear and complains of difficulty swallowing however this is a chronic issue. She has a history of previous esophageal dilation however she continues to experience occasional "gagging fits." She had a swallow study and states that she was initially told that the study was normal however her pulmonologist reports some aspiration. History of L sided breast cancer and she sees oconologist this August for the last visit and then will have to follow-up every 5 years. Pt reports that she was told to avoid hard foods however this is not painful for her. She tried both warm compresses and Tylenol without any improvement. She tried to take the Baclofen however it made her feel too drowsy so she did not continue to take. Pt complains of frequent dry mouth as well as itching in her ears. No history of OSA  and does not wear CPAP.  Last Dental Examination and imaging: [facility]  approximately 3 weeks ago.  Recent dental procedures or orthodontics: No Pain location: Worse is in the R TMJ but pt also gets pain in across the zygomatic process and under the angle of the right mandible Pain Severity: Present: 1/10, Best: 1/10, Worst: 7/10 Pain quality: throbbing ache in jaw but sharp pain in ear  Radiating pain: Radiates around the R side of her face Numbness/Tingling: No Aggravating factors: Swallowing with a closed mouth, rotating the head to the right when sleeping Easing factors: sleeping, no help with self-massage, no help with Tylenol, tried Baclofen but didn't like how it made her feel (drowsy) Clicking, catching, or crepitus during chewing: No 24 hour pain behavior: Sometimes worse when first waking up but not consistently History of grinding teeth: Yes Recent or remote jaw/face/neck trauma, injury, or pain: No Falls: Has patient fallen in last 6 months? No, History of prior physical therapy for this issue: No  Imaging: Yes, see history Progression (improving, worsening, unchanged): gradual worsening but today is a good day History of headaches/migraines: Yes, history of chronic headaches (occur frequently), never formally diagnosed with migraines. Headaches multiple times per week (frontal region around temples). First bout of floaters in August after laser eye surgery but not currently. Photophobia with headaches but no phonophobia or sensitivity to smell. No nausea/vomiting with headaches Ear symptoms (tinnitus, fullness, pain): Yes, "insect noises" in both ears. Intermittent bilateral ear pain but mostly on the right side. Frequent ear itching. Pt reports that she "needs hearing aides" but doesn't want to get them until she gets the itching resolved; Chest pain: Yes, intermittent L sided chest pain occasionally.  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 Denies chills/fever, night sweats, nausea, vomiting, unexplained weight gain/loss;  Precautions: None  Weight Bearing Restrictions: No  Living Environment Lives with: lives with business partner Lives in: House/apartment, single story, 4 steps to enter/exit, no rails. Uses rollator on steps and holds flag post. Equipment: Rollator (2 different ones, one low and one upright), BSC, walk-in shower with bench, no grab bars  Patient Goals: Decrease pain.    OBJECTIVE  Patient Surveys  NDI To be completed FOTO 50, predicted improvement to 69  Mental Status Patient is oriented to person, place and time.  Recent memory is intact.  Remote memory is intact.  Attention span and concentration are intact.  Expressive speech is intact.  Patient's fund of knowledge is within normal limits for educational level.  Cranial Nerves Visual acuity and visual fields are intact  Extraocular muscles are intact  Facial sensation is intact bilaterally  Facial strength is intact bilaterally  Hearing is normal as tested by gross conversation however pt reports some loss of hearing Palate elevates midline, normal phonation  Shoulder shrug strength is intact  Tongue protrudes midline   MUSCULOSKELETAL: Tremor: None Bulk: Normal Tone: Normal Facial Symmetry: Face appears grossly symmetrical  Posture Moderate to severe upper thoracic kyphosis with forward head/neck posture  Cervical Screen Centralization: Deferred Isometrics: Full and painless in all directions Passive Accessory Intervertebral Motion (PAIVM), central and unilateral (bilaterally):  Deferred Spurlings A (ipsilateral lateral  flexion/axial compression): R: Not examined L: Not examined Spurlings B (ipsilateral lateral flexion/contralateral rotation/axial compression): R: Not examined L: Not examined Cervical Distraction Test: Not examined  Cervical Fexion-Rotation Test: Not examined  Hoffman Sign (cervical cord compression): R: Not examined L: Not examined  AROM AROM (Normal range in degrees) AROM  Cervical  Flexion (50) 50* (L upper trap pain)  Extension (80) 30  Right lateral flexion (45) 18* (L sided neck pain)  Left lateral flexion (45) 18* (R sided neck pain)  Right rotation (85) 54  Left rotation (85) 44  (* = pain; Blank rows = not tested)  Dermatome/Myotome Screen N=normal  Ab=abnormal Level Dermatome R L Myotome R L Reflex R L  C2 Posterior Scalp N N Cervical Flexion/Extension C1-2 N N Jaw CN V    C3 Anterior Neck N N Cervical Sidebend C2-3 N N Biceps C5-6    C4 Top of Shoulder N N Shoulder Shrug C4 N N Brachiorad. C5-6    C5 Lateral Upper Arm N N Shoulder ABD C4-5 N N Triceps C7    C6 Lateral Arm/ Thumb N N Arm Flex/ Wrist Ext C5-6 N N     C7 Middle Finger N N Arm Ext//Wrist Flex C6-7 N N     C8 4th & 5th Finger N N Flex/ Ext Carpi Ulnaris C8 N N     T1 Medial Arm N N Interossei T1 N N      Sensation Grossly intact to light touch bilateral face and UE as determined by testing branches of trigeminal nerve and dermatomes C2-T2  Palpation Graded on 0-4 scale (0 = no pain, 1 = pain, 2 = pain with wincing/grimacing/flinching, 3 = pain with withdrawal, 4 = unwilling to allow palpation) Location LEFT  RIGHT           Temporomandibular Joint (posterior, superior, anterior)  1  Temporalis (anterior, middle, posterior fibers)    Temporalis Tendon Insertion  1  Masseter (Zygoma, Body, Lateral surface of angle of mandible)  1  Medial ptyergoid  1  Frontal Sinus    Maxillary Sinus    SCM    Upper Trapezius  1  Subocciptials  1   Mandibular AROM Resting Dental Alignment/Occlusion (Overbite,  underbite, overjet, crossbite): Overbite noted with the R upper molars, crossbite with lower incisors noted to be approximately 2mm to the right of the upper incisors.   Mandible Depression (40-52mm): 53mm Mandible Protrusion (3-61mm): 3mm Mandible Lateral Excursion (10-76mm): R: 8mm  L: 10mm   Strength R/L Functional Mandible Depression (Jaw Opening): WNL, mild pain Functional Mandible Protrusion: WNL Functional Mandible Elevation (Jaw Closing): WNL Functional Mandible Lateral Deviation: WNL bilaterally  Passive Accessory Joint Motion (PAIVM) (Right) Inferior (Caudal Glide): WNL Anterior Glide: WNL Medial Glide: WNL Lateral Glide: Limited and painful Medial and Lateral Extra-oral Glide: R lateral glide is limited and painful. No issues with lateral glide  Special Tests Biting on one separator (ipsilateral muscle activation/contralateral joint compression): R: Negative L: Positive for R sided TMJ pain; Biting on two separators (bilateral muscle activation): R: Not examined L: Not examined Manual Joint Compression: Positive on the R for pain; Manual Joint Distraction: Negative  Beighton scale: Deferred  MMT  MMT (out of 5) Right 06/23/2022 Left 06/23/2022  Cervical (isometric)  Flexion WNL  Extension WNL  Lateral Flexion WNL WNL  Rotation WNL WNL      Shoulder   Flexion 4+ 4+  Extension    Abduction 4+ 4+  Internal rotation    Horizontal abduction    Horizontal adduction    Lower Trapezius    Rhomboids        Elbow  Flexion 5 5  Extension 5 5  Pronation    Supination        Wrist  Flexion 5 5  Extension 5 5  Radial deviation    Ulnar deviation        MCP  Flexion 5 5  Extension 5 5  Abduction 5 5  Adduction 5 5  (* = pain; Blank rows = not tested)  Reflexes Deferred    SUBJECTIVE: Pt reports continued fluctuation of TMJ and neck pain however overall her TMJ pain is much better compared to before starting therapy. She denies any resting neck or TMJ  pain upon arrival today.  Compliant with HEP. No specific questions currently.    PAIN: Denies   TREATMENT  Trigger Point Dry Needling (TDN), unbilled Education previously performed with patient regarding potential benefit of TDN. Previously eviewed precautions and risks with patient. Pt provided verbal consent to treatment. In supine using clean technique TDN performed to R masseter with 3, 0.16 x 30 single needle placements with local twitch response (LTR). Also performed 1 additional 0.16 x 30 single needle placement in R lateral pterygoid. Pistoning technique utilized.   Manual Therapy  Moist heat pack applied to R side of face during interval history x 5 minutes; STM to R masseter (extraoral), R medial ptyergoid (intraoral), R SCM, R suboccipitals and at the submandibular region near the mandibular angle to target medial ptyergoid; Gentle cervical traction 15s hold/15s relax x 2; Lateral flexion and upper trap stretches x 30s each bilateral; Suboccipital stretch 2 x 30s;  Ther-ex  Supine repeated cervical retraction with manual overpressure by therapist x 10; Mandibular lateral deviation isometrics 6s contract hold x 6 bilaterally; Mandibular depression isometrics 6s contract x 6; Mandibular elevation isometrics 6s contract x 6;    PATIENT EDUCATION:  Education details: Pt educated throughout session about proper posture and technique with exercises. Improved exercise technique, movement at target joints, use of target muscles after min to mod verbal, visual, tactile cues. Person educated: Patient Education method: Explanation  Education comprehension: verbalized understanding and returned demonstration   HOME EXERCISE PROGRAM: Access Code: [email or web address] URL: [email or web address] Date: 06/29/2022 Prepared by: Ria Comment  Exercises - Supine Chin Tuck  - 6 x daily - 7 x weekly - 6 reps - 6s hold - Seated Scapular Retraction  - 6 x daily - 7 x weekly - 6 reps -  6s hold - Tongue Clicks TMJ  - 6 x daily - 7 x weekly - 6 reps  Education: Modified head position to achieve targeted stretch    ASSESSMENT:  CLINICAL IMPRESSION: Pt reporting continued improvement in pain/function after therapy sessions. Continued manual techniques including STM. Repeated dry needling to R masseter and R lateral ptyergoid. Continued strengthening during session today. Plan to continue to progress strengthening at future sessions. She will need updated outcome measures/goals at next appointment. Pt encouraged to follow-up as scheduled. She will benefit from skilled PT to address deficits in R sided facial pain in order to improve pain-free function at home.   REHAB POTENTIAL: Good  CLINICAL DECISION MAKING: Unstable/unpredictable  EVALUATION COMPLEXITY: High   GOALS: Goals reviewed with patient? Yes  SHORT TERM GOALS: Target date: 07/21/2022  Pt will be independent with HEP to improve strength and decrease R TMJ pain in order to improve pain-free function  at home. Baseline:  Goal status: INITIAL   LONG TERM GOALS: Target date: 08/18/2022  Pt will increase FOTO to at least 54 to demonstrate significant improvement in function at home and work related to neck pain  Baseline: 06/23/22: 50; Goal status: INITIAL  2.  Pt will decrease worst TMJ pain by at least 3 points on the NPRS in order to demonstrate clinically significant reduction in TMJ pain. Baseline: 06/23/22: worst: 7/10; Goal status: INITIAL  3.  Pt will decrease NDI score by at least 19% in order demonstrate clinically significant reduction in neck pain/disability.       Baseline: 06/23/22: To be completed; 06/25/22: 28% Goal status: INITIAL   PLAN: PT FREQUENCY: 2x/week  PT DURATION: 8 weeks  PLANNED INTERVENTIONS: Therapeutic exercises, Therapeutic activity, Neuromuscular re-education, Balance training, Gait training, Patient/Family education, Joint manipulation, Joint mobilization, Vestibular training,  Canalith repositioning, Dry Needling, Electrical stimulation, Spinal manipulation, Spinal mobilization, Cryotherapy, Moist heat, Taping, Traction, Ultrasound, Ionotophoresis 4mg /ml Dexamethasone, and Manual therapy  PLAN FOR NEXT SESSION: Progress strengthening and manual techniques as needed;  Sharalyn Ink Stewart Sasaki PT, DPT, GCS  Kaz Auld 07/23/2022, 11:02 AM

## 2022-07-23 NOTE — Therapy (Signed)
OUTPATIENT PHYSICAL THERAPY TREATMENT/PROGRESS NOTE  Dates of reporting period  06/23/22   to   07/27/22   Patient Name: Connie Mitchell MRN: [id number] DOB:07-16-1933, 89 y.o., female Today's Date: 07/28/2022   PT End of Session - 07/27/22 1020     Visit Number 10    Number of Visits 17    Date for PT Re-Evaluation 08/18/22    Authorization Type Medicare A&B; BCBS Secondary    PT Start Time 1020    PT Stop Time 1100    PT Time Calculation (min) 40 min    Activity Tolerance Patient tolerated treatment well;No increased pain    Behavior During Therapy WFL for tasks assessed/performed            Past Medical History:  Diagnosis Date   A-fib ([place])    Allergy    Anxiety    Cancer ([place]) 1991   left breast. treated with rads and lumpectomy   Cancer ([place]) 2020   newly diagnosed, also left breast   Cataract 2017   Cellulitis and abscess of toe of left foot 01/2018   Dental bridge present    permanent - bottom   GERD (gastroesophageal reflux disease)    Heart murmur 2017   Afib 2022   Hx of therapeutic radiation 1991   left breast   Laryngopharyngeal reflux    sometimes food goes down wrong way and she ends up in extreme coughing/choking scenario   Personal history of radiation therapy 1991   left breast ca   Primary osteoarthritis    Thoracic aortic aneurysm (TAA) ([place])    Past Surgical History:  Procedure Laterality Date   BREAST BIOPSY Right 02/22/2020   stereo bx, ribbon clip, path pending    BREAST LUMPECTOMY Left 1991   breast ca, lumpectomy   cataracts Bilateral    COLONOSCOPY WITH PROPOFOL N/A 04/28/2021   Procedure: COLONOSCOPY WITH BIOPSY;  Surgeon: Lucilla Lame, MD;  Location: [facility];  Service: Endoscopy;  Laterality: N/A;   CYSTOCELE REPAIR  2010   EYE SURGERY Bilateral 2017   cataracts extracted   JOINT REPLACEMENT     MASTECTOMY Left 07/25/2018   Invasive Mammary Carcinoma   MASTECTOMY W/ SENTINEL NODE BIOPSY Left 07/25/2018   Procedure:  MASTECTOMY WITH SENTINEL LYMPH NODE BIOPSY;  Surgeon: Olean Ree, MD;  Location: [facility];  Service: General;  Laterality: Left;   OOPHORECTOMY  1995   ovarian mass - benign   POLYPECTOMY N/A 04/28/2021   Procedure: POLYPECTOMY;  Surgeon: Lucilla Lame, MD;  Location: [facility] [facility];  Service: Endoscopy;  Laterality: N/A;   REPLACEMENT TOTAL KNEE Bilateral 2012,2013   SKIN BIOPSY Left    arm growth   TONSILLECTOMY  1958   TOTAL SHOULDER ARTHROPLASTY Right 2016   Patient Active Problem List   Diagnosis Date Noted   Arthralgia of right temporomandibular joint 03/27/2022   Acquired thrombophilia ([place]) 12/19/2021   Abnormal CT of the abdomen 10/01/2021   Polyp of ascending colon 10/01/2021   Chronic cough 08/13/2021   Enteritis 07/14/2020   Rectal bleed 07/14/2020   Paroxysmal atrial fibrillation ([place]) 07/14/2020   Aortic atherosclerosis ([place]) 12/12/2019   Axillary adenopathy 01/01/2019   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[place]) 12/30/2018   Osteopenia of neck of right femur 07/12/2018   Thoracic ascending aortic aneurysm ([place]) 05/27/2018   LPRD (laryngopharyngeal reflux disease) 03/17/2018   Chronic foot pain 11/25/2017   Lumbar degenerative disc disease 09/24/2017   Hx of Lyme disease 09/24/2017   Hx of basal  cell carcinoma 09/24/2017   Nuclear sclerotic cataract of left eye 08/09/2015   Osteoporosis 07/04/2014   Primary localized osteoarthrosis of shoulder region 12/13/2012   Osteoarthritis 12/18/2011   PCP: Glean Hess, MD  REFERRING PROVIDER: Glean Hess, MD  REFERRING DIAGNOSIS: [phone] (ICD-10-CM) - Arthralgia of right temporomandibular joint   THERAPY DIAG: Cervicalgia  RATIONALE FOR EVALUATION AND TREATMENT: Rehabilitation  ONSET DATE: September 2023  FOLLOW UP APPT WITH PROVIDER: Yes   FROM INITIAL EVALUATION SUBJECTIVE:                                                                                                                                                                                          Chief Complaint: R TMJ pain  Pertinent History Pt arrives reporting R sided facial pain since September 2023. No specific trauma at the time of onset. She describes the pain as a "really bad ear ache" localized over her R TMJ, R masseter, and near the angle of the R side of her mandible. She saw Dr. Army Melia and was referred for physical therapy as it was determined that the pain was originating in her R TMJ. She was also referred to ENT but was told by their office that they couldn't see her for this issue. She also went to see [facility] approximately 3 weeks ago and reports she had a clear dental exam with normal imaging. She reports that she is missing an upper and lower tooth on the L side and has a deteriorating bridge in the R lower side.  It was recommended the she see oral surgery for her R TMJ pain but she would prefer to try physical therapy first. The pain is most notable when she rotates her head to the right when sleeping and especially when she swallows with a closed mouth.  It hurts to palpate over her R TMJ. She denies any drainage from either ear and complains of difficulty swallowing however this is a chronic issue. She has a history of previous esophageal dilation however she continues to experience occasional "gagging fits." She had a swallow study and states that she was initially told that the study was normal however her pulmonologist reports some aspiration. History of L sided breast cancer and she sees oconologist this August for the last visit and then will have to follow-up every 5 years. Pt reports that she was told to avoid hard foods however this is not painful for her. She tried both warm compresses and Tylenol without any improvement. She tried to take the Baclofen however it made her feel too drowsy so she did not continue to take. Pt complains  of frequent dry mouth as well as itching in her ears. No history of OSA and does not  wear CPAP.  Last Dental Examination and imaging: [facility] approximately 3 weeks ago.  Recent dental procedures or orthodontics: No Pain location: Worse is in the R TMJ but pt also gets pain in across the zygomatic process and under the angle of the right mandible Pain Severity: Present: 1/10, Best: 1/10, Worst: 7/10 Pain quality: throbbing ache in jaw but sharp pain in ear  Radiating pain: Radiates around the R side of her face Numbness/Tingling: No Aggravating factors: Swallowing with a closed mouth, rotating the head to the right when sleeping Easing factors: sleeping, no help with self-massage, no help with Tylenol, tried Baclofen but didn't like how it made her feel (drowsy) Clicking, catching, or crepitus during chewing: No 24 hour pain behavior: Sometimes worse when first waking up but not consistently History of grinding teeth: Yes Recent or remote jaw/face/neck trauma, injury, or pain: No Falls: Has patient fallen in last 6 months? No, History of prior physical therapy for this issue: No  Imaging: Yes, see history Progression (improving, worsening, unchanged): gradual worsening but today is a good day History of headaches/migraines: Yes, history of chronic headaches (occur frequently), never formally diagnosed with migraines. Headaches multiple times per week (frontal region around temples). First bout of floaters in August after laser eye surgery but not currently. Photophobia with headaches but no phonophobia or sensitivity to smell. No nausea/vomiting with headaches Ear symptoms (tinnitus, fullness, pain): Yes, "insect noises" in both ears. Intermittent bilateral ear pain but mostly on the right side. Frequent ear itching. Pt reports that she "needs hearing aides" but doesn't want to get them until she gets the itching resolved; Chest pain: Yes, intermittent L sided chest pain occasionally.  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 Denies chills/fever, night sweats, nausea, vomiting, unexplained weight gain/loss;  Precautions: None  Weight Bearing Restrictions: No  Living Environment Lives with: lives with business partner Lives in: House/apartment, single story, 4 steps to enter/exit, no rails. Uses rollator on steps and holds flag post. Equipment: Rollator (2 different ones, one low and one upright), BSC, walk-in shower with bench, no grab bars  Patient Goals: Decrease pain.    OBJECTIVE  Patient Surveys  NDI To be completed FOTO 50, predicted improvement to 28  Mental Status Patient is oriented to person, place and time.  Recent memory is intact.  Remote memory is intact.  Attention span and concentration are intact.  Expressive speech is intact.  Patient's fund of knowledge is within normal limits for educational level.  Cranial Nerves Visual acuity and visual fields are intact  Extraocular muscles are intact  Facial sensation is intact bilaterally  Facial strength is intact bilaterally  Hearing is normal as tested by gross conversation however pt reports some loss of hearing Palate elevates midline, normal phonation  Shoulder shrug strength is intact  Tongue protrudes midline   MUSCULOSKELETAL: Tremor: None Bulk: Normal Tone: Normal Facial Symmetry: Face appears grossly symmetrical  Posture Moderate to severe upper thoracic kyphosis with forward head/neck posture  Cervical Screen Centralization: Deferred Isometrics:  Full and painless in all directions Passive Accessory Intervertebral Motion (PAIVM), central and unilateral (bilaterally):  Deferred Spurlings A (ipsilateral lateral flexion/axial compression): R: Not examined L: Not examined Spurlings B (ipsilateral lateral flexion/contralateral rotation/axial compression): R: Not examined L: Not examined Cervical Distraction Test: Not examined  Cervical Fexion-Rotation Test: Not examined  Hoffman Sign (cervical cord compression): R: Not examined L: Not examined  AROM AROM (Normal range in degrees) AROM  Cervical  Flexion (50) 50* (L upper trap pain)  Extension (80) 30  Right lateral flexion (45) 18* (L sided neck pain)  Left lateral flexion (45) 18* (R sided neck pain)  Right rotation (85) 54  Left rotation (85) 44  (* = pain; Blank rows = not tested)  Dermatome/Myotome Screen N=normal  Ab=abnormal Level Dermatome R L Myotome R L Reflex R L  C2 Posterior Scalp N N Cervical Flexion/Extension C1-2 N N Jaw CN V    C3 Anterior Neck N N Cervical Sidebend C2-3 N N Biceps C5-6    C4 Top of Shoulder N N Shoulder Shrug C4 N N Brachiorad. C5-6    C5 Lateral Upper Arm N N Shoulder ABD C4-5 N N Triceps C7    C6 Lateral Arm/ Thumb N N Arm Flex/ Wrist Ext C5-6 N N     C7 Middle Finger N N Arm Ext//Wrist Flex C6-7 N N     C8 4th & 5th Finger N N Flex/ Ext Carpi Ulnaris C8 N N     T1 Medial Arm N N Interossei T1 N N      Sensation Grossly intact to light touch bilateral face and UE as determined by testing branches of trigeminal nerve and dermatomes C2-T2  Palpation Graded on 0-4 scale (0 = no pain, 1 = pain, 2 = pain with wincing/grimacing/flinching, 3 = pain with withdrawal, 4 = unwilling to allow palpation) Location LEFT  RIGHT           Temporomandibular Joint (posterior, superior, anterior)  1  Temporalis (anterior, middle, posterior fibers)    Temporalis Tendon Insertion  1  Masseter (Zygoma, Body, Lateral surface of angle of mandible)  1  Medial ptyergoid  1  Frontal Sinus    Maxillary Sinus    SCM    Upper Trapezius  1  Subocciptials  1   Mandibular AROM Resting  Dental Alignment/Occlusion (Overbite, underbite, overjet, crossbite): Overbite noted with the R upper molars, crossbite with lower incisors noted to be approximately 33m to the right of the upper incisors.   Mandible Depression (40-58m: 5358mandible Protrusion (3-6mm104m3mm 50mdible Lateral Excursion (10-12mm)38m 8mm  L31m0mm   54mngth R/L Functional Mandible Depression (Jaw Opening): WNL, mild pain Functional Mandible Protrusion: WNL Functional Mandible Elevation (Jaw Closing): WNL Functional Mandible Lateral Deviation: WNL bilaterally  Passive Accessory Joint Motion (PAIVM) (Right) Inferior (Caudal Glide): WNL Anterior Glide: WNL Medial Glide: WNL Lateral Glide: Limited and painful Medial and Lateral Extra-oral Glide: R lateral glide is limited and painful. No issues with lateral glide  Special Tests Biting on one separator (ipsilateral muscle activation/contralateral joint compression): R: Negative L: Positive for R sided TMJ pain; Biting on two separators (bilateral muscle activation): R: Not examined L: Not examined Manual Joint Compression: Positive on the R for pain; Manual Joint Distraction: Negative  Beighton scale: Deferred  MMT MMT (out of 5) Right 06/23/2022 Left 06/23/2022  Cervical (isometric)  Flexion WNL  Extension WNL  Lateral Flexion WNL WNL  Rotation WNL WNL  Shoulder   Flexion 4+ 4+  Extension    Abduction 4+ 4+  Internal rotation    Horizontal abduction    Horizontal adduction    Lower Trapezius    Rhomboids        Elbow  Flexion 5 5  Extension 5 5  Pronation    Supination        Wrist  Flexion 5 5  Extension 5 5  Radial deviation    Ulnar deviation        MCP  Flexion 5 5  Extension 5 5  Abduction 5 5  Adduction 5 5  (* = pain; Blank rows = not tested)  Reflexes Deferred   TREATMENT   SUBJECTIVE: Pt reports recent worsening of her TMJ pain and states that she is now "back to square 1." Prior to recent worsening of pain  she states that her symptoms were close to 100% resolved. She also continues with neck pain. She reports 2-3/10 R TMJ pain upon arrival today. Notes compliance with HEP. No specific questions currently.    PAIN: 2-3/10 R TMJ pain   Trigger Point Dry Needling (TDN), unbilled Education previously performed with patient regarding potential benefit of TDN. Previously eviewed precautions and risks with patient. Pt provided verbal consent to treatment. In supine using clean technique TDN performed to R masseter with 2, 0.16 x 30 single needle placements with local twitch response (LTR). Also performed 1 additional 0.16 x 30 single needle placement in R lateral pterygoid. Pistoning technique utilized.    Manual Therapy  Moist heat pack applied to R side of face during interval history x 5 minutes;  Updated outcome measures with patient: Worst pain: 3/10;  STM to R masseter (extraoral), R medial ptyergoid (intraoral), R SCM, R suboccipitals and at the submandibular region near the mandibular angle to target medial ptyergoid; Gentle cervical traction 15s hold/15s relax x 2; Lateral flexion and upper trap stretches x 30s each bilateral; Suboccipital stretch 2 x 30s;   Ther-ex  Supine repeated cervical retraction with manual overpressure by therapist 2 x 10; Mandibular lateral deviation isometrics 6s contract hold x 6 bilaterally; Mandibular depression isometrics 6s contract x 6; Mandibular elevation isometrics 6s contract x 6;    PATIENT EDUCATION:  Education details: Pt educated throughout session about proper posture and technique with exercises. Improved exercise technique, movement at target joints, use of target muscles after min to mod verbal, visual, tactile cues. Person educated: Patient Education method: Explanation  Education comprehension: verbalized understanding and returned demonstration   HOME EXERCISE PROGRAM: Access Code: [email or web address] URL:  [email or web address] Date: 06/29/2022 Prepared by: Roxana Hires  Exercises - Supine Chin Tuck  - 6 x daily - 7 x weekly - 6 reps - 6s hold - Seated Scapular Retraction  - 6 x daily - 7 x weekly - 6 reps - 6s hold - Tongue Clicks TMJ  - 6 x daily - 7 x weekly - 6 reps  Education: Modified head position to achieve targeted stretch    ASSESSMENT:  CLINICAL IMPRESSION: Pt reports recent worsening of her TMJ pain and states that she is now "back to square 1." Prior to recent worsening of pain she states that her symptoms were close to 100% resolved. Deferred updating FOTO and NDI today due to acute worsening of her TMJ pain over the last week. However pt states that the worst her TMJ pain has been over the last week is still only 3/10. This is a significant improvement compared to  7/10 that she reported at the initial evaluation. Continued manual techniques including STM. Repeated dry needling to R masseter and R lateral ptyergoid. Continued strengthening during session today as well. Plan to continue to progress strengthening at future sessions. She will need additional updated outcome measures/goals at next appointment. Pt encouraged to follow-up as scheduled. She will benefit from skilled PT to address deficits in R sided facial pain in order to improve pain-free function at home.   REHAB POTENTIAL: Good  CLINICAL DECISION MAKING: Unstable/unpredictable  EVALUATION COMPLEXITY: High   GOALS: Goals reviewed with patient? Yes  SHORT TERM GOALS: Target date: 07/21/2022  Pt will be independent with HEP to improve strength and decrease R TMJ pain in order to improve pain-free function at home. Baseline:  Goal status: INITIAL   LONG TERM GOALS: Target date: 08/18/2022  Pt will increase FOTO to at least 54 to demonstrate significant improvement in function at home and work related to neck pain  Baseline: 06/23/22: 50; 07/27/22: Deferred Goal status: INITIAL  2.  Pt will  decrease worst TMJ pain by at least 3 points on the NPRS in order to demonstrate clinically significant reduction in TMJ pain. Baseline: 06/23/22: worst: 7/10; 07/27/22: worst: 3/10; Goal status: ACHIEVED  3.  Pt will decrease NDI score by at least 19% in order demonstrate clinically significant reduction in neck pain/disability.       Baseline: 06/23/22: To be completed; 06/25/22: 28%; 07/27/22: Deferred Goal status: INITIAL   PLAN: PT FREQUENCY: 2x/week  PT DURATION: 8 weeks  PLANNED INTERVENTIONS: Therapeutic exercises, Therapeutic activity, Neuromuscular re-education, Balance training, Gait training, Patient/Family education, Joint manipulation, Joint mobilization, Vestibular training, Canalith repositioning, Dry Needling, Electrical stimulation, Spinal manipulation, Spinal mobilization, Cryotherapy, Moist heat, Taping, Traction, Ultrasound, Ionotophoresis 49m/ml Dexamethasone, and Manual therapy  PLAN FOR NEXT SESSION: Progress strengthening and manual techniques as needed;  JLyndel SafeHuprich PT, DPT, GCS  Quantasia Stegner 07/28/2022, 8:31 AM

## 2022-07-27 ENCOUNTER — Ambulatory Visit: Payer: Medicare Other

## 2022-07-27 DIAGNOSIS — M542 Cervicalgia: Secondary | ICD-10-CM

## 2022-07-29 ENCOUNTER — Ambulatory Visit: Payer: Medicare Other

## 2022-07-29 DIAGNOSIS — M542 Cervicalgia: Secondary | ICD-10-CM

## 2022-07-29 NOTE — Therapy (Signed)
OUTPATIENT PHYSICAL THERAPY TREATMENT/GOAL UPDATE  Patient Name: Connie Mitchell MRN: [id number] DOB:25-Feb-1934, 89 y.o., female Today's Date: 07/31/2022   PT End of Session - 07/31/22 1309     Visit Number 11    Number of Visits 17    Date for PT Re-Evaluation 08/18/22    Authorization Type Medicare A&B; BCBS Secondary    PT Start Time 1400    PT Stop Time 1445    PT Time Calculation (min) 45 min    Activity Tolerance Patient tolerated treatment well;No increased pain    Behavior During Therapy WFL for tasks assessed/performed            Past Medical History:  Diagnosis Date   A-fib ([place])    Allergy    Anxiety    Cancer ([place]) 1991   left breast. treated with rads and lumpectomy   Cancer ([place]) 2020   newly diagnosed, also left breast   Cataract 2017   Cellulitis and abscess of toe of left foot 01/2018   Dental bridge present    permanent - bottom   GERD (gastroesophageal reflux disease)    Heart murmur 2017   Afib 2022   Hx of therapeutic radiation 1991   left breast   Laryngopharyngeal reflux    sometimes food goes down wrong way and she ends up in extreme coughing/choking scenario   Personal history of radiation therapy 1991   left breast ca   Primary osteoarthritis    Thoracic aortic aneurysm (TAA) ([place])    Past Surgical History:  Procedure Laterality Date   BREAST BIOPSY Right 02/22/2020   stereo bx, ribbon clip, path pending    BREAST LUMPECTOMY Left 1991   breast ca, lumpectomy   cataracts Bilateral    COLONOSCOPY WITH PROPOFOL N/A 04/28/2021   Procedure: COLONOSCOPY WITH BIOPSY;  Surgeon: Lucilla Lame, MD;  Location: [facility];  Service: Endoscopy;  Laterality: N/A;   CYSTOCELE REPAIR  2010   EYE SURGERY Bilateral 2017   cataracts extracted   JOINT REPLACEMENT     MASTECTOMY Left 07/25/2018   Invasive Mammary Carcinoma   MASTECTOMY W/ SENTINEL NODE BIOPSY Left 07/25/2018   Procedure: MASTECTOMY WITH SENTINEL LYMPH NODE BIOPSY;  Surgeon:  Olean Ree, MD;  Location: [facility];  Service: General;  Laterality: Left;   OOPHORECTOMY  1995   ovarian mass - benign   POLYPECTOMY N/A 04/28/2021   Procedure: POLYPECTOMY;  Surgeon: Lucilla Lame, MD;  Location: [facility];  Service: Endoscopy;  Laterality: N/A;   REPLACEMENT TOTAL KNEE Bilateral 2012,2013   SKIN BIOPSY Left    arm growth   TONSILLECTOMY  1958   TOTAL SHOULDER ARTHROPLASTY Right 2016   Patient Active Problem List   Diagnosis Date Noted   Arthralgia of right temporomandibular joint 03/27/2022   Acquired thrombophilia ([place]) 12/19/2021   Abnormal CT of the abdomen 10/01/2021   Polyp of ascending colon 10/01/2021   Chronic cough 08/13/2021   Enteritis 07/14/2020   Rectal bleed 07/14/2020   Paroxysmal atrial fibrillation ([place]) 07/14/2020   Aortic atherosclerosis ([place]) 12/12/2019   Axillary adenopathy 01/01/2019   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[place]) 12/30/2018   Osteopenia of neck of right femur 07/12/2018   Thoracic ascending aortic aneurysm ([place]) 05/27/2018   LPRD (laryngopharyngeal reflux disease) 03/17/2018   Chronic foot pain 11/25/2017   Lumbar degenerative disc disease 09/24/2017   Hx of Lyme disease 09/24/2017   Hx of basal cell carcinoma 09/24/2017   Nuclear sclerotic cataract of left eye 08/09/2015  Osteoporosis 07/04/2014   Primary localized osteoarthrosis of shoulder region 12/13/2012   Osteoarthritis 12/18/2011   PCP: Glean Hess, MD  REFERRING PROVIDER: Glean Hess, MD  REFERRING DIAGNOSIS: [phone] (ICD-10-CM) - Arthralgia of right temporomandibular joint   THERAPY DIAG: Cervicalgia  RATIONALE FOR EVALUATION AND TREATMENT: Rehabilitation  ONSET DATE: September 2023  FOLLOW UP APPT WITH PROVIDER: Yes   FROM INITIAL EVALUATION SUBJECTIVE:                                                                                                                                                                                         Chief  Complaint: R TMJ pain  Pertinent History Pt arrives reporting R sided facial pain since September 2023. No specific trauma at the time of onset. She describes the pain as a "really bad ear ache" localized over her R TMJ, R masseter, and near the angle of the R side of her mandible. She saw Dr. Army Melia and was referred for physical therapy as it was determined that the pain was originating in her R TMJ. She was also referred to ENT but was told by their office that they couldn't see her for this issue. She also went to see [place] approximately 3 weeks ago and reports she had a clear dental exam with normal imaging. She reports that she is missing an upper and lower tooth on the L side and has a deteriorating bridge in the R lower side.  It was recommended the she see oral surgery for her R TMJ pain but she would prefer to try physical therapy first. The pain is most notable when she rotates her head to the right when sleeping and especially when she swallows with a closed mouth.  It hurts to palpate over her R TMJ. She denies any drainage from either ear and complains of difficulty swallowing however this is a chronic issue. She has a history of previous esophageal dilation however she continues to experience occasional "gagging fits." She had a swallow study and states that she was initially told that the study was normal however her pulmonologist reports some aspiration. History of L sided breast cancer and she sees oconologist this August for the last visit and then will have to follow-up every 5 years. Pt reports that she was told to avoid hard foods however this is not painful for her. She tried both warm compresses and Tylenol without any improvement. She tried to take the Baclofen however it made her feel too drowsy so she did not continue to take. Pt complains of frequent dry mouth as well as itching in her ears. No history of  OSA and does not wear CPAP.  Last Dental Examination and imaging: [facility] approximately 3 weeks ago.  Recent dental procedures or orthodontics: No Pain location: Worse is in the R TMJ but pt also gets pain in across the zygomatic process and under the angle of the right mandible Pain Severity: Present: 1/10, Best: 1/10, Worst: 7/10 Pain quality: throbbing ache in jaw but sharp pain in ear  Radiating pain: Radiates around the R side of her face Numbness/Tingling: No Aggravating factors: Swallowing with a closed mouth, rotating the head to the right when sleeping Easing factors: sleeping, no help with self-massage, no help with Tylenol, tried Baclofen but didn't like how it made her feel (drowsy) Clicking, catching, or crepitus during chewing: No 24 hour pain behavior: Sometimes worse when first waking up but not consistently History of grinding teeth: Yes Recent or remote jaw/face/neck trauma, injury, or pain: No Falls: Has patient fallen in last 6 months? No, History of prior physical therapy for this issue: No  Imaging: Yes, see history Progression (improving, worsening, unchanged): gradual worsening but today is a good day History of headaches/migraines: Yes, history of chronic headaches (occur frequently), never formally diagnosed with migraines. Headaches multiple times per week (frontal region around temples). First bout of floaters in August after laser eye surgery but not currently. Photophobia with headaches but no phonophobia or sensitivity to smell. No nausea/vomiting with headaches Ear symptoms (tinnitus, fullness, pain): Yes, "insect noises" in both ears. Intermittent bilateral ear pain but mostly on the right side. Frequent ear itching. Pt reports that she "needs hearing aides" but doesn't want to get them until she gets the itching resolved; Chest pain: Yes, intermittent L sided chest pain occasionally.  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 Denies chills/fever, night sweats, nausea, vomiting, unexplained weight gain/loss;  Precautions: None  Weight Bearing Restrictions: No  Living Environment Lives with: lives with business partner Lives in: House/apartment, single story, 4 steps to enter/exit, no rails. Uses rollator on steps and holds flag post. Equipment: Rollator (2 different ones, one low and one upright), BSC, walk-in shower with bench, no grab bars  Patient Goals: Decrease pain.   OBJECTIVE  Patient Surveys  NDI To be completed FOTO 50, predicted improvement to 48  Mental Status Patient is oriented to person, place and time.  Recent memory is intact.  Remote memory is intact.  Attention span and concentration are intact.  Expressive speech is intact.  Patient's fund of knowledge is within normal limits for educational level.  Cranial Nerves Visual acuity and visual fields are intact  Extraocular muscles are intact  Facial sensation is intact bilaterally  Facial strength is intact bilaterally  Hearing is normal as tested by gross conversation however pt reports some loss of hearing Palate elevates midline, normal phonation  Shoulder shrug strength is intact  Tongue protrudes midline   MUSCULOSKELETAL: Tremor: None Bulk: Normal Tone: Normal Facial Symmetry: Face appears grossly symmetrical  Posture Moderate to severe upper thoracic kyphosis with forward head/neck posture  Cervical Screen Centralization: Deferred Isometrics: Full and painless in all directions Passive Accessory Intervertebral Motion (PAIVM), central and unilateral (bilaterally):  Deferred Spurlings A (ipsilateral lateral flexion/axial  compression): R: Not examined L: Not examined Spurlings B (ipsilateral lateral flexion/contralateral rotation/axial compression): R: Not examined L: Not examined Cervical Distraction Test: Not examined  Cervical Fexion-Rotation Test: Not examined  Hoffman Sign (cervical cord compression): R: Not examined L: Not examined  AROM AROM (Normal range in degrees) AROM  Cervical  Flexion (50) 50* (L upper trap pain)  Extension (80) 30  Right lateral flexion (45) 18* (L sided neck pain)  Left lateral flexion (45) 18* (R sided neck pain)  Right rotation (85) 54  Left rotation (85) 44  (* = pain; Blank rows = not tested)  Dermatome/Myotome Screen N=normal  Ab=abnormal Level Dermatome R L Myotome R L Reflex R L  C2 Posterior Scalp N N Cervical Flexion/Extension C1-2 N N Jaw CN V    C3 Anterior Neck N N Cervical Sidebend C2-3 N N Biceps C5-6    C4 Top of Shoulder N N Shoulder Shrug C4 N N Brachiorad. C5-6    C5 Lateral Upper Arm N N Shoulder ABD C4-5 N N Triceps C7    C6 Lateral Arm/ Thumb N N Arm Flex/ Wrist Ext C5-6 N N     C7 Middle Finger N N Arm Ext//Wrist Flex C6-7 N N     C8 4th & 5th Finger N N Flex/ Ext Carpi Ulnaris C8 N N     T1 Medial Arm N N Interossei T1 N N      Sensation Grossly intact to light touch bilateral face and UE as determined by testing branches of trigeminal nerve and dermatomes C2-T2  Palpation Graded on 0-4 scale (0 = no pain, 1 = pain, 2 = pain with wincing/grimacing/flinching, 3 = pain with withdrawal, 4 = unwilling to allow palpation) Location LEFT  RIGHT           Temporomandibular Joint (posterior, superior, anterior)  1  Temporalis (anterior, middle, posterior fibers)    Temporalis Tendon Insertion  1  Masseter (Zygoma, Body, Lateral surface of angle of mandible)  1  Medial ptyergoid  1  Frontal Sinus    Maxillary Sinus    SCM    Upper Trapezius  1  Subocciptials  1   Mandibular AROM Resting Dental Alignment/Occlusion (Overbite, underbite, overjet,  crossbite): Overbite noted with the R upper molars, crossbite with lower incisors noted to be approximately 61m to the right of the upper incisors.   Mandible Depression (40-535m: 5335mandible Protrusion (3-6mm44m3mm 19mdible Lateral Excursion (10-12mm)79m 8mm  L46m0mm   17mngth R/L Functional Mandible Depression (Jaw Opening): WNL, mild pain Functional Mandible Protrusion: WNL Functional Mandible Elevation (Jaw Closing): WNL Functional Mandible Lateral Deviation: WNL bilaterally  Passive Accessory Joint Motion (PAIVM) (Right) Inferior (Caudal Glide): WNL Anterior Glide: WNL Medial Glide: WNL Lateral Glide: Limited and painful Medial and Lateral Extra-oral Glide: R lateral glide is limited and painful. No issues with lateral glide  Special Tests Biting on one separator (ipsilateral muscle activation/contralateral joint compression): R: Negative L: Positive for R sided TMJ pain; Biting on two separators (bilateral muscle activation): R: Not examined L: Not examined Manual Joint Compression: Positive on the R for pain; Manual Joint Distraction: Negative  Beighton scale: Deferred  MMT MMT (out of 5) Right 06/23/2022 Left 06/23/2022  Cervical (isometric)  Flexion WNL  Extension WNL  Lateral Flexion WNL WNL  Rotation WNL WNL      Shoulder   Flexion 4+ 4+  Extension    Abduction 4+ 4+  Internal rotation    Horizontal abduction    Horizontal adduction    Lower Trapezius    Rhomboids        Elbow  Flexion 5 5  Extension 5 5  Pronation    Supination        Wrist  Flexion 5 5  Extension 5 5  Radial deviation    Ulnar deviation        MCP  Flexion 5 5  Extension 5 5  Abduction 5 5  Adduction 5 5  (* = pain; Blank rows = not tested)  Reflexes Deferred   TREATMENT   SUBJECTIVE: Pt reports improvement in her TMJ pain since the last therapy session. No resting pain in neck or TMJ upon arrival today. Notes compliance with HEP. She has had a scratchy voice  today but denies any additional symptoms. No specific questions currently.    PAIN: Denies   Manual Therapy  Updated outcome measures with patient; Moist heat pack applied to cervical during interval history x 5 minutes; Gentle cervical traction 15s hold/15s relax x 2; Lateral flexion and upper trap stretches x 30s each bilateral; Suboccipital stretch 2 x 30s; CPA C2-C5, grade I-II, 20s/bout x 2 bouts/level; Extensive STM to bilateral suboccipitals, SCM, cervical paraspinals, and upper traps;   Ther-ex  Supine repeated cervical retraction with manual overpressure by therapist 2 x 10; Manually resisted cervical rotation and lateral flexion x 10 each bilaterally;   PATIENT EDUCATION:  Education details: Pt educated throughout session about proper posture and technique with exercises. Improved exercise technique, movement at target joints, use of target muscles after min to mod verbal, visual, tactile cues. Person educated: Patient Education method: Explanation  Education comprehension: verbalized understanding and returned demonstration   HOME EXERCISE PROGRAM: Access Code: [email or web address] URL: [email or web address] Date: 06/29/2022 Prepared by: Roxana Hires  Exercises - Supine Chin Tuck  - 6 x daily - 7 x weekly - 6 reps - 6s hold - Seated Scapular Retraction  - 6 x daily - 7 x weekly - 6 reps - 6s hold - Tongue Clicks TMJ  - 6 x daily - 7 x weekly - 6 reps  Education: Modified head position to achieve targeted stretch    ASSESSMENT:  CLINICAL IMPRESSION: Due to pt complaining of a scratch throat today's session focused on her neck pain. Continued manual techniques including extensive STM to bilateral suboccipitals, SCM, cervical paraspinals, and upper traps. Deferred trigger point dry needling today but continued cervical strengthening. Plan to continue to progress strengthening at future sessions. Updated outcome measures/goals today. Her FOTO improved from 50  to 56 and her NDI decreased from 28% to 26%. Pt encouraged to follow-up as scheduled. She will benefit from skilled PT to address deficits in R sided facial pain in order to improve pain-free function at home.   REHAB POTENTIAL: Good  CLINICAL DECISION MAKING: Unstable/unpredictable  EVALUATION COMPLEXITY: High   GOALS: Goals reviewed with patient? Yes  SHORT TERM GOALS: Target date: 07/21/2022  Pt will be independent with HEP to improve strength and decrease R TMJ pain in order to improve pain-free function at home. Baseline:  Goal status: INITIAL   LONG TERM GOALS: Target date: 08/18/2022  Pt will increase FOTO to at least 54 to demonstrate significant improvement in function at home and work related to jaw pain. Baseline: 06/23/22: 50; 07/27/22: Deferred; 07/29/22: 56; Goal status: ACHIEVED  2.  Pt will decrease worst TMJ pain by at least 3 points on the NPRS in  order to demonstrate clinically significant reduction in TMJ pain. Baseline: 06/23/22: worst: 7/10; 07/27/22: worst: 3/10; Goal status: ACHIEVED  3.  Pt will decrease NDI score by at least 19% in order demonstrate clinically significant reduction in neck pain/disability.       Baseline: 06/23/22: To be completed; 06/25/22: 28%; 07/27/22: Deferred; 07/29/22: 26% Goal status: PARTIALLY MET   PLAN: PT FREQUENCY: 2x/week  PT DURATION: 8 weeks  PLANNED INTERVENTIONS: Therapeutic exercises, Therapeutic activity, Neuromuscular re-education, Balance training, Gait training, Patient/Family education, Joint manipulation, Joint mobilization, Vestibular training, Canalith repositioning, Dry Needling, Electrical stimulation, Spinal manipulation, Spinal mobilization, Cryotherapy, Moist heat, Taping, Traction, Ultrasound, Ionotophoresis 25m/ml Dexamethasone, and Manual therapy  PLAN FOR NEXT SESSION: Progress strengthening and manual techniques as needed;  JLyndel SafeHuprich PT, DPT, GCS  Dene Landsberg 07/31/2022, 1:17 PM

## 2022-08-03 ENCOUNTER — Ambulatory Visit: Payer: Medicare Other

## 2022-08-03 DIAGNOSIS — M542 Cervicalgia: Secondary | ICD-10-CM | POA: Diagnosis not present

## 2022-08-03 NOTE — Therapy (Signed)
OUTPATIENT PHYSICAL THERAPY TREATMENT  Patient Name: Connie Mitchell MRN: [id number] DOB:1934-01-11, 89 y.o., female Today's Date: 08/03/2022   PT End of Session - 08/03/22 1028     Visit Number 12    Number of Visits 17    Date for PT Re-Evaluation 08/18/22    Authorization Type Medicare A&B; BCBS Secondary    PT Start Time 1020    PT Stop Time 1100    PT Time Calculation (min) 40 min    Activity Tolerance Patient tolerated treatment well;No increased pain    Behavior During Therapy WFL for tasks assessed/performed            Past Medical History:  Diagnosis Date   A-fib ([place])    Allergy    Anxiety    Cancer ([place]) 1991   left breast. treated with rads and lumpectomy   Cancer ([place]) 2020   newly diagnosed, also left breast   Cataract 2017   Cellulitis and abscess of toe of left foot 01/2018   Dental bridge present    permanent - bottom   GERD (gastroesophageal reflux disease)    Heart murmur 2017   Afib 2022   Hx of therapeutic radiation 1991   left breast   Laryngopharyngeal reflux    sometimes food goes down wrong way and she ends up in extreme coughing/choking scenario   Personal history of radiation therapy 1991   left breast ca   Primary osteoarthritis    Thoracic aortic aneurysm (TAA) ([place])    Past Surgical History:  Procedure Laterality Date   BREAST BIOPSY Right 02/22/2020   stereo bx, ribbon clip, path pending    BREAST LUMPECTOMY Left 1991   breast ca, lumpectomy   cataracts Bilateral    COLONOSCOPY WITH PROPOFOL N/A 04/28/2021   Procedure: COLONOSCOPY WITH BIOPSY;  Surgeon: Lucilla Lame, MD;  Location: [facility];  Service: Endoscopy;  Laterality: N/A;   CYSTOCELE REPAIR  2010   EYE SURGERY Bilateral 2017   cataracts extracted   JOINT REPLACEMENT     MASTECTOMY Left 07/25/2018   Invasive Mammary Carcinoma   MASTECTOMY W/ SENTINEL NODE BIOPSY Left 07/25/2018   Procedure: MASTECTOMY WITH SENTINEL LYMPH NODE BIOPSY;  Surgeon: Olean Ree,  MD;  Location: [facility];  Service: General;  Laterality: Left;   OOPHORECTOMY  1995   ovarian mass - benign   POLYPECTOMY N/A 04/28/2021   Procedure: POLYPECTOMY;  Surgeon: Lucilla Lame, MD;  Location: [facility];  Service: Endoscopy;  Laterality: N/A;   REPLACEMENT TOTAL KNEE Bilateral 2012,2013   SKIN BIOPSY Left    arm growth   TONSILLECTOMY  1958   TOTAL SHOULDER ARTHROPLASTY Right 2016   Patient Active Problem List   Diagnosis Date Noted   Arthralgia of right temporomandibular joint 03/27/2022   Acquired thrombophilia ([place]) 12/19/2021   Abnormal CT of the abdomen 10/01/2021   Polyp of ascending colon 10/01/2021   Chronic cough 08/13/2021   Enteritis 07/14/2020   Rectal bleed 07/14/2020   Paroxysmal atrial fibrillation ([place]) 07/14/2020   Aortic atherosclerosis ([place]) 12/12/2019   Axillary adenopathy 01/01/2019   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[place]) 12/30/2018   Osteopenia of neck of right femur 07/12/2018   Thoracic ascending aortic aneurysm ([place]) 05/27/2018   LPRD (laryngopharyngeal reflux disease) 03/17/2018   Chronic foot pain 11/25/2017   Lumbar degenerative disc disease 09/24/2017   Hx of Lyme disease 09/24/2017   Hx of basal cell carcinoma 09/24/2017   Nuclear sclerotic cataract of left eye 08/09/2015   Osteoporosis  07/04/2014   Primary localized osteoarthrosis of shoulder region 12/13/2012   Osteoarthritis 12/18/2011   PCP: Glean Hess, MD  REFERRING PROVIDER: Glean Hess, MD  REFERRING DIAGNOSIS: [phone] (ICD-10-CM) - Arthralgia of right temporomandibular joint   THERAPY DIAG: Cervicalgia  RATIONALE FOR EVALUATION AND TREATMENT: Rehabilitation  ONSET DATE: September 2023  FOLLOW UP APPT WITH PROVIDER: Yes   FROM INITIAL EVALUATION SUBJECTIVE:                                                                                                                                                                                         Chief Complaint: R  TMJ pain  Pertinent History Pt arrives reporting R sided facial pain since September 2023. No specific trauma at the time of onset. She describes the pain as a "really bad ear ache" localized over her R TMJ, R masseter, and near the angle of the R side of her mandible. She saw Dr. Army Melia and was referred for physical therapy as it was determined that the pain was originating in her R TMJ. She was also referred to ENT but was told by their office that they couldn't see her for this issue. She also went to see Alicia approximately 3 weeks ago and reports she had a clear dental exam with normal imaging. She reports that she is missing an upper and lower tooth on the L side and has a deteriorating bridge in the R lower side.  It was recommended the she see oral surgery for her R TMJ pain but she would prefer to try physical therapy first. The pain is most notable when she rotates her head to the right when sleeping and especially when she swallows with a closed mouth.  It hurts to palpate over her R TMJ. She denies any drainage from either ear and complains of difficulty swallowing however this is a chronic issue. She has a history of previous esophageal dilation however she continues to experience occasional "gagging fits." She had a swallow study and states that she was initially told that the study was normal however her pulmonologist reports some aspiration. History of L sided breast cancer and she sees oconologist this August for the last visit and then will have to follow-up every 5 years. Pt reports that she was told to avoid hard foods however this is not painful for her. She tried both warm compresses and Tylenol without any improvement. She tried to take the Baclofen however it made her feel too drowsy so she did not continue to take. Pt complains of frequent dry mouth as well as itching in her ears. No history of OSA  and does not wear CPAP.  Last Dental Examination and imaging: [facility]  approximately 3 weeks ago.  Recent dental procedures or orthodontics: No Pain location: Worse is in the R TMJ but pt also gets pain in across the zygomatic process and under the angle of the right mandible Pain Severity: Present: 1/10, Best: 1/10, Worst: 7/10 Pain quality: throbbing ache in jaw but sharp pain in ear  Radiating pain: Radiates around the R side of her face Numbness/Tingling: No Aggravating factors: Swallowing with a closed mouth, rotating the head to the right when sleeping Easing factors: sleeping, no help with self-massage, no help with Tylenol, tried Baclofen but didn't like how it made her feel (drowsy) Clicking, catching, or crepitus during chewing: No 24 hour pain behavior: Sometimes worse when first waking up but not consistently History of grinding teeth: Yes Recent or remote jaw/face/neck trauma, injury, or pain: No Falls: Has patient fallen in last 6 months? No, History of prior physical therapy for this issue: No  Imaging: Yes, see history Progression (improving, worsening, unchanged): gradual worsening but today is a good day History of headaches/migraines: Yes, history of chronic headaches (occur frequently), never formally diagnosed with migraines. Headaches multiple times per week (frontal region around temples). First bout of floaters in August after laser eye surgery but not currently. Photophobia with headaches but no phonophobia or sensitivity to smell. No nausea/vomiting with headaches Ear symptoms (tinnitus, fullness, pain): Yes, "insect noises" in both ears. Intermittent bilateral ear pain but mostly on the right side. Frequent ear itching. Pt reports that she "needs hearing aides" but doesn't want to get them until she gets the itching resolved; Chest pain: Yes, intermittent L sided chest pain occasionally.  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 Denies chills/fever, night sweats, nausea, vomiting, unexplained weight gain/loss;  Precautions: None  Weight Bearing Restrictions: No  Living Environment Lives with: lives with business partner Lives in: House/apartment, single story, 4 steps to enter/exit, no rails. Uses rollator on steps and holds flag post. Equipment: Rollator (2 different ones, one low and one upright), BSC, walk-in shower with bench, no grab bars  Patient Goals: Decrease pain.   OBJECTIVE  Patient Surveys  NDI To be completed FOTO 50, predicted improvement to 4  Mental Status Patient is oriented to person, place and time.  Recent memory is intact.  Remote memory is intact.  Attention span and concentration are intact.  Expressive speech is intact.  Patient's fund of knowledge is within normal limits for educational level.  Cranial Nerves Visual acuity and visual fields are intact  Extraocular muscles are intact  Facial sensation is intact bilaterally  Facial strength is intact bilaterally  Hearing is normal as tested by gross conversation however pt reports some loss of hearing Palate elevates midline, normal phonation  Shoulder shrug strength is intact  Tongue protrudes midline   MUSCULOSKELETAL: Tremor: None Bulk: Normal Tone: Normal Facial Symmetry: Face appears grossly symmetrical  Posture Moderate to severe upper thoracic kyphosis with forward head/neck posture  Cervical Screen Centralization: Deferred Isometrics: Full and painless in all directions Passive Accessory Intervertebral Motion (PAIVM), central and unilateral (bilaterally): Deferred  Spurlings A (ipsilateral lateral flexion/axial  compression): R: Not examined L: Not examined Spurlings B (ipsilateral lateral flexion/contralateral rotation/axial compression): R: Not examined L: Not examined Cervical Distraction Test: Not examined  Cervical Fexion-Rotation Test: Not examined  Hoffman Sign (cervical cord compression): R: Not examined L: Not examined  AROM AROM (Normal range in degrees) AROM  Cervical  Flexion (50) 50* (L upper trap pain)  Extension (80) 30  Right lateral flexion (45) 18* (L sided neck pain)  Left lateral flexion (45) 18* (R sided neck pain)  Right rotation (85) 54  Left rotation (85) 44  (* = pain; Blank rows = not tested)  Dermatome/Myotome Screen N=normal  Ab=abnormal Level Dermatome R L Myotome R L Reflex R L  C2 Posterior Scalp N N Cervical Flexion/Extension C1-2 N N Jaw CN V    C3 Anterior Neck N N Cervical Sidebend C2-3 N N Biceps C5-6    C4 Top of Shoulder N N Shoulder Shrug C4 N N Brachiorad. C5-6    C5 Lateral Upper Arm N N Shoulder ABD C4-5 N N Triceps C7    C6 Lateral Arm/ Thumb N N Arm Flex/ Wrist Ext C5-6 N N     C7 Middle Finger N N Arm Ext//Wrist Flex C6-7 N N     C8 4th & 5th Finger N N Flex/ Ext Carpi Ulnaris C8 N N     T1 Medial Arm N N Interossei T1 N N      Sensation Grossly intact to light touch bilateral face and UE as determined by testing branches of trigeminal nerve and dermatomes C2-T2  Palpation Graded on 0-4 scale (0 = no pain, 1 = pain, 2 = pain with wincing/grimacing/flinching, 3 = pain with withdrawal, 4 = unwilling to allow palpation) Location LEFT  RIGHT           Temporomandibular Joint (posterior, superior, anterior)  1  Temporalis (anterior, middle, posterior fibers)    Temporalis Tendon Insertion  1  Masseter (Zygoma, Body, Lateral surface of angle of mandible)  1  Medial ptyergoid  1  Frontal Sinus    Maxillary Sinus    SCM    Upper Trapezius  1  Subocciptials  1   Mandibular AROM Resting Dental Alignment/Occlusion (Overbite, underbite, overjet,  crossbite): Overbite noted with the R upper molars, crossbite with lower incisors noted to be approximately 22m to the right of the upper incisors.   Mandible Depression (40-564m: 5382mandible Protrusion (3-6mm85m3mm 38mdible Lateral Excursion (10-12mm)27m 8mm  L16m0mm   80mngth R/L Functional Mandible Depression (Jaw Opening): WNL, mild pain Functional Mandible Protrusion: WNL Functional Mandible Elevation (Jaw Closing): WNL Functional Mandible Lateral Deviation: WNL bilaterally  Passive Accessory Joint Motion (PAIVM) (Right) Inferior (Caudal Glide): WNL Anterior Glide: WNL Medial Glide: WNL Lateral Glide: Limited and painful Medial and Lateral Extra-oral Glide: R lateral glide is limited and painful. No issues with lateral glide  Special Tests Biting on one separator (ipsilateral muscle activation/contralateral joint compression): R: Negative L: Positive for R sided TMJ pain; Biting on two separators (bilateral muscle activation): R: Not examined L: Not examined Manual Joint Compression: Positive on the R for pain; Manual Joint Distraction: Negative  Beighton scale: Deferred  MMT MMT (out of 5) Right 06/23/2022 Left 06/23/2022  Cervical (isometric)  Flexion WNL  Extension WNL  Lateral Flexion WNL WNL  Rotation WNL WNL      Shoulder   Flexion 4+ 4+  Extension    Abduction 4+ 4+  Internal  rotation    Horizontal abduction    Horizontal adduction    Lower Trapezius    Rhomboids        Elbow  Flexion 5 5  Extension 5 5  Pronation    Supination        Wrist  Flexion 5 5  Extension 5 5  Radial deviation    Ulnar deviation        MCP  Flexion 5 5  Extension 5 5  Abduction 5 5  Adduction 5 5  (* = pain; Blank rows = not tested)  Reflexes Deferred   TREATMENT   SUBJECTIVE: Pt reports improvement in her TMJ pain since the last therapy session. No resting pain in neck or TMJ upon arrival today. Notes compliance with HEP. Improvement in her scratchy  throat but not fully resolved. Pt never developed any additional symptoms. No specific questions currently.    PAIN: Denies   Manual Therapy  Moist heat pack applied to cervical spine and R TMJ during interval history x 5 minutes; Gentle cervical traction 15s hold/15s relax x 2; Lateral flexion, upper trap, and levator stretches x 30s each bilateral; Supine CPA C2-C5, grade I-II, 20s/bout x 2 bouts/level; Extensive STM to bilateral suboccipitals, R SCM, cervical paraspinals, R upper trap, R masseter (extraoral and intraoral), and at the R submandibular region near the mandibular angle to target medial ptyergoid; Gentle cervical traction 15s hold/15s relax x 2; Lateral flexion and upper trap stretches x 30s each bilateral; Suboccipital stretch 2 x 30s;   Ther-ex  Supine repeated cervical retraction with manual overpressure by therapist x 10; Manually resisted cervical rotation and lateral flexion x 10 each bilaterally; Mandibular lateral deviation isometrics 6s contract hold x 6 bilaterally; Mandibular depression isometrics 6s contract x 6; Mandibular elevation isometrics 6s contract x 6;    PATIENT EDUCATION:  Education details: Pt educated throughout session about proper posture and technique with exercises. Improved exercise technique, movement at target joints, use of target muscles after min to mod verbal, visual, tactile cues. Person educated: Patient Education method: Explanation  Education comprehension: verbalized understanding and returned demonstration   HOME EXERCISE PROGRAM: Access Code: [email or web address] URL: [email or web address] Date: 06/29/2022 Prepared by: Roxana Hires  Exercises - Supine Chin Tuck  - 6 x daily - 7 x weekly - 6 reps - 6s hold - Seated Scapular Retraction  - 6 x daily - 7 x weekly - 6 reps - 6s hold - Tongue Clicks TMJ  - 6 x daily - 7 x weekly - 6 reps  Education: Modified head position to achieve targeted stretch     ASSESSMENT:  CLINICAL IMPRESSION: Reintroduced exercises for R TMJ today. Continued manual techniques including extensive STM to bilateral suboccipitals, R SCM, cervical paraspinals, R upper trap, R masseter (extraoral and intraoral), and at the R submandibular region near the mandibular angle to target medial ptyergoid;. Deferred trigger point dry needling again today but continued cervical strengthening. Plan to continue to progress strengthening at future sessions. Pt encouraged to follow-up as scheduled. She will benefit from skilled PT to address deficits in R sided facial pain in order to improve pain-free function at home.   REHAB POTENTIAL: Good  CLINICAL DECISION MAKING: Unstable/unpredictable  EVALUATION COMPLEXITY: High   GOALS: Goals reviewed with patient? Yes  SHORT TERM GOALS: Target date: 07/21/2022  Pt will be independent with HEP to improve strength and decrease R TMJ pain in order to improve pain-free function at home. Baseline:  Goal status: INITIAL  LONG TERM GOALS: Target date: 08/18/2022  Pt will increase FOTO to at least 54 to demonstrate significant improvement in function at home and work related to jaw pain. Baseline: 06/23/22: 50; 07/27/22: Deferred; 07/29/22: 56; Goal status: ACHIEVED  2.  Pt will decrease worst TMJ pain by at least 3 points on the NPRS in order to demonstrate clinically significant reduction in TMJ pain. Baseline: 06/23/22: worst: 7/10; 07/27/22: worst: 3/10; Goal status: ACHIEVED  3.  Pt will decrease NDI score by at least 19% in order demonstrate clinically significant reduction in neck pain/disability.       Baseline: 06/23/22: To be completed; 06/25/22: 28%; 07/27/22: Deferred; 07/29/22: 26% Goal status: PARTIALLY MET   PLAN: PT FREQUENCY: 2x/week  PT DURATION: 8 weeks  PLANNED INTERVENTIONS: Therapeutic exercises, Therapeutic activity, Neuromuscular re-education, Balance training, Gait training, Patient/Family education, Joint  manipulation, Joint mobilization, Vestibular training, Canalith repositioning, Dry Needling, Electrical stimulation, Spinal manipulation, Spinal mobilization, Cryotherapy, Moist heat, Taping, Traction, Ultrasound, Ionotophoresis 48m/ml Dexamethasone, and Manual therapy  PLAN FOR NEXT SESSION: Progress strengthening and manual techniques as needed;  JLyndel SafeHuprich PT, DPT, GCS  Layanna Charo 08/03/2022, 9:19 PM

## 2022-08-05 ENCOUNTER — Ambulatory Visit: Payer: Medicare Other

## 2022-08-05 DIAGNOSIS — M542 Cervicalgia: Secondary | ICD-10-CM | POA: Diagnosis not present

## 2022-08-05 NOTE — Therapy (Signed)
OUTPATIENT PHYSICAL THERAPY TREATMENT  Patient Name: Connie Mitchell MRN: [id number] DOB:19-Apr-1934, 89 y.o., female Today's Date: 08/07/2022   PT End of Session - 08/07/22 1441     Visit Number 13    Number of Visits 17    Date for PT Re-Evaluation 08/18/22    Authorization Type Medicare A&B; BCBS Secondary    PT Start Time [id number]    PT Stop Time [id number]    PT Time Calculation (min) 43 min    Activity Tolerance Patient tolerated treatment well;No increased pain    Behavior During Therapy WFL for tasks assessed/performed            Past Medical History:  Diagnosis Date   A-fib ([place])    Allergy    Anxiety    Cancer ([place]) 1991   left breast. treated with rads and lumpectomy   Cancer ([place]) 2020   newly diagnosed, also left breast   Cataract 2017   Cellulitis and abscess of toe of left foot 01/2018   Dental bridge present    permanent - bottom   GERD (gastroesophageal reflux disease)    Heart murmur 2017   Afib 2022   Hx of therapeutic radiation 1991   left breast   Laryngopharyngeal reflux    sometimes food goes down wrong way and she ends up in extreme coughing/choking scenario   Personal history of radiation therapy 1991   left breast ca   Primary osteoarthritis    Thoracic aortic aneurysm (TAA) ([place])    Past Surgical History:  Procedure Laterality Date   BREAST BIOPSY Right 02/22/2020   stereo bx, ribbon clip, path pending    BREAST LUMPECTOMY Left 1991   breast ca, lumpectomy   cataracts Bilateral    COLONOSCOPY WITH PROPOFOL N/A 04/28/2021   Procedure: COLONOSCOPY WITH BIOPSY;  Surgeon: Lucilla Lame, MD;  Location: [facility];  Service: Endoscopy;  Laterality: N/A;   CYSTOCELE REPAIR  2010   EYE SURGERY Bilateral 2017   cataracts extracted   JOINT REPLACEMENT     MASTECTOMY Left 07/25/2018   Invasive Mammary Carcinoma   MASTECTOMY W/ SENTINEL NODE BIOPSY Left 07/25/2018   Procedure: MASTECTOMY WITH SENTINEL LYMPH NODE BIOPSY;  Surgeon: Olean Ree,  MD;  Location: [facility];  Service: General;  Laterality: Left;   OOPHORECTOMY  1995   ovarian mass - benign   POLYPECTOMY N/A 04/28/2021   Procedure: POLYPECTOMY;  Surgeon: Lucilla Lame, MD;  Location: [facility];  Service: Endoscopy;  Laterality: N/A;   REPLACEMENT TOTAL KNEE Bilateral 2012,2013   SKIN BIOPSY Left    arm growth   TONSILLECTOMY  1958   TOTAL SHOULDER ARTHROPLASTY Right 2016   Patient Active Problem List   Diagnosis Date Noted   Arthralgia of right temporomandibular joint 03/27/2022   Acquired thrombophilia ([place]) 12/19/2021   Abnormal CT of the abdomen 10/01/2021   Polyp of ascending colon 10/01/2021   Chronic cough 08/13/2021   Enteritis 07/14/2020   Rectal bleed 07/14/2020   Paroxysmal atrial fibrillation ([place]) 07/14/2020   Aortic atherosclerosis ([place]) 12/12/2019   Axillary adenopathy 01/01/2019   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[place]) 12/30/2018   Osteopenia of neck of right femur 07/12/2018   Thoracic ascending aortic aneurysm ([place]) 05/27/2018   LPRD (laryngopharyngeal reflux disease) 03/17/2018   Chronic foot pain 11/25/2017   Lumbar degenerative disc disease 09/24/2017   Hx of Lyme disease 09/24/2017   Hx of basal cell carcinoma 09/24/2017   Nuclear sclerotic cataract of left eye 08/09/2015   Osteoporosis  07/04/2014   Primary localized osteoarthrosis of shoulder region 12/13/2012   Osteoarthritis 12/18/2011   PCP: Glean Hess, MD  REFERRING PROVIDER: Glean Hess, MD  REFERRING DIAGNOSIS: [id number] (ICD-10-CM) - Arthralgia of right temporomandibular joint   THERAPY DIAG: Cervicalgia  RATIONALE FOR EVALUATION AND TREATMENT: Rehabilitation  ONSET DATE: September 2023  FOLLOW UP APPT WITH PROVIDER: Yes   FROM INITIAL EVALUATION SUBJECTIVE:                                                                                                                                                                                         Chief Complaint: R  TMJ pain  Pertinent History Pt arrives reporting R sided facial pain since September 2023. No specific trauma at the time of onset. She describes the pain as a "really bad ear ache" localized over her R TMJ, R masseter, and near the angle of the R side of her mandible. She saw Dr. Army Melia and was referred for physical therapy as it was determined that the pain was originating in her R TMJ. She was also referred to ENT but was told by their office that they couldn't see her for this issue. She also went to see [facility] approximately 3 weeks ago and reports she had a clear dental exam with normal imaging. She reports that she is missing an upper and lower tooth on the L side and has a deteriorating bridge in the R lower side.  It was recommended the she see oral surgery for her R TMJ pain but she would prefer to try physical therapy first. The pain is most notable when she rotates her head to the right when sleeping and especially when she swallows with a closed mouth.  It hurts to palpate over her R TMJ. She denies any drainage from either ear and complains of difficulty swallowing however this is a chronic issue. She has a history of previous esophageal dilation however she continues to experience occasional "gagging fits." She had a swallow study and states that she was initially told that the study was normal however her pulmonologist reports some aspiration. History of L sided breast cancer and she sees oconologist this August for the last visit and then will have to follow-up every 5 years. Pt reports that she was told to avoid hard foods however this is not painful for her. She tried both warm compresses and Tylenol without any improvement. She tried to take the Baclofen however it made her feel too drowsy so she did not continue to take. Pt complains of frequent dry mouth as well as itching in her ears. No history of OSA  and does not wear CPAP.  Last Dental Examination and imaging: [facility]  approximately 3 weeks ago.  Recent dental procedures or orthodontics: No Pain location: Worse is in the R TMJ but pt also gets pain in across the zygomatic process and under the angle of the right mandible Pain Severity: Present: 1/10, Best: 1/10, Worst: 7/10 Pain quality: throbbing ache in jaw but sharp pain in ear  Radiating pain: Radiates around the R side of her face Numbness/Tingling: No Aggravating factors: Swallowing with a closed mouth, rotating the head to the right when sleeping Easing factors: sleeping, no help with self-massage, no help with Tylenol, tried Baclofen but didn't like how it made her feel (drowsy) Clicking, catching, or crepitus during chewing: No 24 hour pain behavior: Sometimes worse when first waking up but not consistently History of grinding teeth: Yes Recent or remote jaw/face/neck trauma, injury, or pain: No Falls: Has patient fallen in last 6 months? No, History of prior physical therapy for this issue: No  Imaging: Yes, see history Progression (improving, worsening, unchanged): gradual worsening but today is a good day History of headaches/migraines: Yes, history of chronic headaches (occur frequently), never formally diagnosed with migraines. Headaches multiple times per week (frontal region around temples). First bout of floaters in August after laser eye surgery but not currently. Photophobia with headaches but no phonophobia or sensitivity to smell. No nausea/vomiting with headaches Ear symptoms (tinnitus, fullness, pain): Yes, "insect noises" in both ears. Intermittent bilateral ear pain but mostly on the right side. Frequent ear itching. Pt reports that she "needs hearing aides" but doesn't want to get them until she gets the itching resolved; Chest pain: Yes, intermittent L sided chest pain occasionally.  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 Denies chills/fever, night sweats, nausea, vomiting, unexplained weight gain/loss;  Precautions: None  Weight Bearing Restrictions: No  Living Environment Lives with: lives with business partner Lives in: House/apartment, single story, 4 steps to enter/exit, no rails. Uses rollator on steps and holds flag post. Equipment: Rollator (2 different ones, one low and one upright), BSC, walk-in shower with bench, no grab bars  Patient Goals: Decrease pain.   OBJECTIVE  Patient Surveys  NDI To be completed FOTO 50, predicted improvement to 55  Mental Status Patient is oriented to person, place and time.  Recent memory is intact.  Remote memory is intact.  Attention span and concentration are intact.  Expressive speech is intact.  Patient's fund of knowledge is within normal limits for educational level.  Cranial Nerves Visual acuity and visual fields are intact  Extraocular muscles are intact  Facial sensation is intact bilaterally  Facial strength is intact bilaterally  Hearing is normal as tested by gross conversation however pt reports some loss of hearing Palate elevates midline, normal phonation  Shoulder shrug strength is intact  Tongue protrudes midline   MUSCULOSKELETAL: Tremor: None Bulk: Normal Tone: Normal Facial Symmetry: Face appears grossly symmetrical  Posture Moderate to severe upper thoracic kyphosis with forward head/neck posture  Cervical Screen Centralization: Deferred Isometrics: Full and painless in all directions Passive Accessory Intervertebral Motion (PAIVM), central and unilateral (bilaterally): Deferred  Spurlings A (ipsilateral lateral flexion/axial  compression): R: Not examined L: Not examined Spurlings B (ipsilateral lateral flexion/contralateral rotation/axial compression): R: Not examined L: Not examined Cervical Distraction Test: Not examined  Cervical Fexion-Rotation Test: Not examined  Hoffman Sign (cervical cord compression): R: Not examined L: Not examined  AROM AROM (Normal range in degrees) AROM  Cervical  Flexion (50) 50* (L upper trap pain)  Extension (80) 30  Right lateral flexion (45) 18* (L sided neck pain)  Left lateral flexion (45) 18* (R sided neck pain)  Right rotation (85) 54  Left rotation (85) 44  (* = pain; Blank rows = not tested)  Dermatome/Myotome Screen N=normal  Ab=abnormal Level Dermatome R L Myotome R L Reflex R L  C2 Posterior Scalp N N Cervical Flexion/Extension C1-2 N N Jaw CN V    C3 Anterior Neck N N Cervical Sidebend C2-3 N N Biceps C5-6    C4 Top of Shoulder N N Shoulder Shrug C4 N N Brachiorad. C5-6    C5 Lateral Upper Arm N N Shoulder ABD C4-5 N N Triceps C7    C6 Lateral Arm/ Thumb N N Arm Flex/ Wrist Ext C5-6 N N     C7 Middle Finger N N Arm Ext//Wrist Flex C6-7 N N     C8 4th & 5th Finger N N Flex/ Ext Carpi Ulnaris C8 N N     T1 Medial Arm N N Interossei T1 N N      Sensation Grossly intact to light touch bilateral face and UE as determined by testing branches of trigeminal nerve and dermatomes C2-T2  Palpation Graded on 0-4 scale (0 = no pain, 1 = pain, 2 = pain with wincing/grimacing/flinching, 3 = pain with withdrawal, 4 = unwilling to allow palpation) Location LEFT  RIGHT           Temporomandibular Joint (posterior, superior, anterior)  1  Temporalis (anterior, middle, posterior fibers)    Temporalis Tendon Insertion  1  Masseter (Zygoma, Body, Lateral surface of angle of mandible)  1  Medial ptyergoid  1  Frontal Sinus    Maxillary Sinus    SCM    Upper Trapezius  1  Subocciptials  1   Mandibular AROM Resting Dental Alignment/Occlusion (Overbite, underbite, overjet,  crossbite): Overbite noted with the R upper molars, crossbite with lower incisors noted to be approximately 90m to the right of the upper incisors.   Mandible Depression (40-539m: 532mandible Protrusion (3-6mm8m3mm 62mdible Lateral Excursion (10-12mm)90m 8mm  L66m0mm   42mngth R/L Functional Mandible Depression (Jaw Opening): WNL, mild pain Functional Mandible Protrusion: WNL Functional Mandible Elevation (Jaw Closing): WNL Functional Mandible Lateral Deviation: WNL bilaterally  Passive Accessory Joint Motion (PAIVM) (Right) Inferior (Caudal Glide): WNL Anterior Glide: WNL Medial Glide: WNL Lateral Glide: Limited and painful Medial and Lateral Extra-oral Glide: R lateral glide is limited and painful. No issues with lateral glide  Special Tests Biting on one separator (ipsilateral muscle activation/contralateral joint compression): R: Negative L: Positive for R sided TMJ pain; Biting on two separators (bilateral muscle activation): R: Not examined L: Not examined Manual Joint Compression: Positive on the R for pain; Manual Joint Distraction: Negative  Beighton scale: Deferred  MMT MMT (out of 5) Right 06/23/2022 Left 06/23/2022  Cervical (isometric)  Flexion WNL  Extension WNL  Lateral Flexion WNL WNL  Rotation WNL WNL      Shoulder   Flexion 4+ 4+  Extension    Abduction 4+ 4+  Internal  rotation    Horizontal abduction    Horizontal adduction    Lower Trapezius    Rhomboids        Elbow  Flexion 5 5  Extension 5 5  Pronation    Supination        Wrist  Flexion 5 5  Extension 5 5  Radial deviation    Ulnar deviation        MCP  Flexion 5 5  Extension 5 5  Abduction 5 5  Adduction 5 5  (* = pain; Blank rows = not tested)  Reflexes Deferred   TREATMENT   SUBJECTIVE: Pt reports she is doing well today. No resting pain in neck or TMJ upon arrival today. Notes compliance with HEP. No specific questions currently.    PAIN: Denies   Manual  Therapy  Moist heat pack applied to cervical spine and R TMJ during interval history x 5 minutes; Gentle cervical traction 15s hold/15s relax x 2; Lateral flexion, upper trap, and levator stretches x 30s each bilateral; Suboccipital stretch 2 x 30s; Suboccipital release x 60s; Supine CPA C2-C5, grade I-II, 20s/bout x 2 bouts/level; Extensive STM to bilateral suboccipitals, R SCM, cervical paraspinals, R upper trap, R masseter (extraoral), and at the R submandibular region near the mandibular angle to target medial ptyergoid;   Ther-ex  Supine repeated cervical retraction with manual overpressure by therapist x 10; Manually resisted cervical rotation and lateral flexion x 10 each bilaterally; Mandibular lateral deviation isometrics 6s contract hold x 6 bilaterally; Mandibular depression isometrics 6s contract x 6; Mandibular elevation isometrics 6s contract x 6;    PATIENT EDUCATION:  Education details: Pt educated throughout session about proper posture and technique with exercises. Improved exercise technique, movement at target joints, use of target muscles after min to mod verbal, visual, tactile cues. Person educated: Patient Education method: Explanation  Education comprehension: verbalized understanding and returned demonstration   HOME EXERCISE PROGRAM: Access Code: [email or web address] URL: [email or web address] Date: 06/29/2022 Prepared by: Roxana Hires  Exercises - Supine Chin Tuck  - 6 x daily - 7 x weekly - 6 reps - 6s hold - Seated Scapular Retraction  - 6 x daily - 7 x weekly - 6 reps - 6s hold - Tongue Clicks TMJ  - 6 x daily - 7 x weekly - 6 reps  Education: Modified head position to achieve targeted stretch    ASSESSMENT:  CLINICAL IMPRESSION: Continued exercises for cervical spine and R TMJ today. Continued manual techniques including extensive STM to bilateral suboccipitals, R SCM, cervical paraspinals, R upper trap, R masseter (extraoral), and at the  R submandibular region near the mandibular angle to target medial ptyergoid. Deferred trigger point dry needling again today. Plan to continue to progress strengthening at future sessions. Pt encouraged to follow-up as scheduled. She will benefit from skilled PT to address deficits in R sided facial pain in order to improve pain-free function at home.   REHAB POTENTIAL: Good  CLINICAL DECISION MAKING: Unstable/unpredictable  EVALUATION COMPLEXITY: High   GOALS: Goals reviewed with patient? Yes  SHORT TERM GOALS: Target date: 07/21/2022  Pt will be independent with HEP to improve strength and decrease R TMJ pain in order to improve pain-free function at home. Baseline:  Goal status: INITIAL   LONG TERM GOALS: Target date: 08/18/2022  Pt will increase FOTO to at least 54 to demonstrate significant improvement in function at home and work related to jaw pain. Baseline: 06/23/22: 50; 07/27/22: Deferred; 07/29/22: 56; Goal status:  ACHIEVED  2.  Pt will decrease worst TMJ pain by at least 3 points on the NPRS in order to demonstrate clinically significant reduction in TMJ pain. Baseline: 06/23/22: worst: 7/10; 07/27/22: worst: 3/10; Goal status: ACHIEVED  3.  Pt will decrease NDI score by at least 19% in order demonstrate clinically significant reduction in neck pain/disability.       Baseline: 06/23/22: To be completed; 06/25/22: 28%; 07/27/22: Deferred; 07/29/22: 26% Goal status: PARTIALLY MET   PLAN: PT FREQUENCY: 2x/week  PT DURATION: 8 weeks  PLANNED INTERVENTIONS: Therapeutic exercises, Therapeutic activity, Neuromuscular re-education, Balance training, Gait training, Patient/Family education, Joint manipulation, Joint mobilization, Vestibular training, Canalith repositioning, Dry Needling, Electrical stimulation, Spinal manipulation, Spinal mobilization, Cryotherapy, Moist heat, Taping, Traction, Ultrasound, Ionotophoresis '4mg'$ /ml Dexamethasone, and Manual therapy  PLAN FOR NEXT SESSION:  Progress strengthening and manual techniques as needed;  Lyndel Safe Jahleah Mariscal PT, DPT, GCS  Wylie Coon 08/07/2022, 2:44 PM

## 2022-08-10 ENCOUNTER — Ambulatory Visit: Payer: Medicare Other

## 2022-08-11 NOTE — Therapy (Signed)
OUTPATIENT PHYSICAL THERAPY TREATMENT/DISCHARGE  Patient Name: Connie Mitchell MRN: [id number] DOB:25-Nov-1933, 89 y.o., female Today's Date: 08/13/2022   PT End of Session - 08/12/22 1322     Visit Number 14    Number of Visits 17    Date for PT Re-Evaluation 08/18/22    Authorization Type Medicare A&B; BCBS Secondary    PT Start Time 1320    PT Stop Time 1400    PT Time Calculation (min) 40 min    Activity Tolerance Patient tolerated treatment well;No increased pain    Behavior During Therapy WFL for tasks assessed/performed            Past Medical History:  Diagnosis Date   A-fib ([place])    Allergy    Anxiety    Cancer ([place]) 1991   left breast. treated with rads and lumpectomy   Cancer ([place]) 2020   newly diagnosed, also left breast   Cataract 2017   Cellulitis and abscess of toe of left foot 01/2018   Dental bridge present    permanent - bottom   GERD (gastroesophageal reflux disease)    Heart murmur 2017   Afib 2022   Hx of therapeutic radiation 1991   left breast   Laryngopharyngeal reflux    sometimes food goes down wrong way and she ends up in extreme coughing/choking scenario   Personal history of radiation therapy 1991   left breast ca   Primary osteoarthritis    Thoracic aortic aneurysm (TAA) ([place])    Past Surgical History:  Procedure Laterality Date   BREAST BIOPSY Right 02/22/2020   stereo bx, ribbon clip, path pending    BREAST LUMPECTOMY Left 1991   breast ca, lumpectomy   cataracts Bilateral    COLONOSCOPY WITH PROPOFOL N/A 04/28/2021   Procedure: COLONOSCOPY WITH BIOPSY;  Surgeon: Lucilla Lame, MD;  Location: [facility];  Service: Endoscopy;  Laterality: N/A;   CYSTOCELE REPAIR  2010   EYE SURGERY Bilateral 2017   cataracts extracted   JOINT REPLACEMENT     MASTECTOMY Left 07/25/2018   Invasive Mammary Carcinoma   MASTECTOMY W/ SENTINEL NODE BIOPSY Left 07/25/2018   Procedure: MASTECTOMY WITH SENTINEL LYMPH NODE BIOPSY;  Surgeon:  Olean Ree, MD;  Location: [facility];  Service: General;  Laterality: Left;   OOPHORECTOMY  1995   ovarian mass - benign   POLYPECTOMY N/A 04/28/2021   Procedure: POLYPECTOMY;  Surgeon: Lucilla Lame, MD;  Location: [facility];  Service: Endoscopy;  Laterality: N/A;   REPLACEMENT TOTAL KNEE Bilateral 2012,2013   SKIN BIOPSY Left    arm growth   TONSILLECTOMY  1958   TOTAL SHOULDER ARTHROPLASTY Right 2016   Patient Active Problem List   Diagnosis Date Noted   Arthralgia of right temporomandibular joint 03/27/2022   Acquired thrombophilia ([place]) 12/19/2021   Abnormal CT of the abdomen 10/01/2021   Polyp of ascending colon 10/01/2021   Chronic cough 08/13/2021   Enteritis 07/14/2020   Rectal bleed 07/14/2020   Paroxysmal atrial fibrillation ([place]) 07/14/2020   Aortic atherosclerosis ([place]) 12/12/2019   Axillary adenopathy 01/01/2019   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[place]) 12/30/2018   Osteopenia of neck of right femur 07/12/2018   Thoracic ascending aortic aneurysm ([place]) 05/27/2018   LPRD (laryngopharyngeal reflux disease) 03/17/2018   Chronic foot pain 11/25/2017   Lumbar degenerative disc disease 09/24/2017   Hx of Lyme disease 09/24/2017   Hx of basal cell carcinoma 09/24/2017   Nuclear sclerotic cataract of left eye 08/09/2015   Osteoporosis  07/04/2014   Primary localized osteoarthrosis of shoulder region 12/13/2012   Osteoarthritis 12/18/2011   PCP: Glean Hess, MD  REFERRING PROVIDER: Glean Hess, MD  REFERRING DIAGNOSIS: [phone] (ICD-10-CM) - Arthralgia of right temporomandibular joint   THERAPY DIAG: Cervicalgia  RATIONALE FOR EVALUATION AND TREATMENT: Rehabilitation  ONSET DATE: September 2023  FOLLOW UP APPT WITH PROVIDER: Yes   FROM INITIAL EVALUATION SUBJECTIVE:                                                                                                                                                                                         Chief  Complaint: R TMJ pain  Pertinent History Pt arrives reporting R sided facial pain since September 2023. No specific trauma at the time of onset. She describes the pain as a "really bad ear ache" localized over her R TMJ, R masseter, and near the angle of the R side of her mandible. She saw Dr. Army Melia and was referred for physical therapy as it was determined that the pain was originating in her R TMJ. She was also referred to ENT but was told by their office that they couldn't see her for this issue. She also went to see [facility] approximately 3 weeks ago and reports she had a clear dental exam with normal imaging. She reports that she is missing an upper and lower tooth on the L side and has a deteriorating bridge in the R lower side.  It was recommended the she see oral surgery for her R TMJ pain but she would prefer to try physical therapy first. The pain is most notable when she rotates her head to the right when sleeping and especially when she swallows with a closed mouth.  It hurts to palpate over her R TMJ. She denies any drainage from either ear and complains of difficulty swallowing however this is a chronic issue. She has a history of previous esophageal dilation however she continues to experience occasional "gagging fits." She had a swallow study and states that she was initially told that the study was normal however her pulmonologist reports some aspiration. History of L sided breast cancer and she sees oconologist this August for the last visit and then will have to follow-up every 5 years. Pt reports that she was told to avoid hard foods however this is not painful for her. She tried both warm compresses and Tylenol without any improvement. She tried to take the Baclofen however it made her feel too drowsy so she did not continue to take. Pt complains of frequent dry mouth as well as itching in her ears. No history of OSA  and does not wear CPAP.  Last Dental Examination and imaging: [facility] approximately 3 weeks ago.  Recent dental procedures or orthodontics: No Pain location: Worse is in the R TMJ but pt also gets pain in across the zygomatic process and under the angle of the right mandible Pain Severity: Present: 1/10, Best: 1/10, Worst: 7/10 Pain quality: throbbing ache in jaw but sharp pain in ear  Radiating pain: Radiates around the R side of her face Numbness/Tingling: No Aggravating factors: Swallowing with a closed mouth, rotating the head to the right when sleeping Easing factors: sleeping, no help with self-massage, no help with Tylenol, tried Baclofen but didn't like how it made her feel (drowsy) Clicking, catching, or crepitus during chewing: No 24 hour pain behavior: Sometimes worse when first waking up but not consistently History of grinding teeth: Yes Recent or remote jaw/face/neck trauma, injury, or pain: No Falls: Has patient fallen in last 6 months? No, History of prior physical therapy for this issue: No  Imaging: Yes, see history Progression (improving, worsening, unchanged): gradual worsening but today is a good day History of headaches/migraines: Yes, history of chronic headaches (occur frequently), never formally diagnosed with migraines. Headaches multiple times per week (frontal region around temples). First bout of floaters in August after laser eye surgery but not currently. Photophobia with headaches but no phonophobia or sensitivity to smell. No nausea/vomiting with headaches Ear symptoms (tinnitus, fullness, pain): Yes, "insect noises" in both ears. Intermittent bilateral ear pain but mostly on the right side. Frequent ear itching. Pt reports that she "needs hearing aides" but doesn't want to get them until she gets the itching resolved; Chest pain: Yes, intermittent L sided chest pain occasionally.  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 Denies chills/fever, night sweats, nausea, vomiting, unexplained weight gain/loss;  Precautions: None  Weight Bearing Restrictions: No  Living Environment Lives with: lives with business partner Lives in: House/apartment, single story, 4 steps to enter/exit, no rails. Uses rollator on steps and holds flag post. Equipment: Rollator (2 different ones, one low and one upright), BSC, walk-in shower with bench, no grab bars  Patient Goals: Decrease pain.   OBJECTIVE  Patient Surveys  NDI To be completed FOTO 50, predicted improvement to 66  Mental Status Patient is oriented to person, place and time.  Recent memory is intact.  Remote memory is intact.  Attention span and concentration are intact.  Expressive speech is intact.  Patient's fund of knowledge is within normal limits for educational level.  Cranial Nerves Visual acuity and visual fields are intact  Extraocular muscles are intact  Facial sensation is intact bilaterally  Facial strength is intact bilaterally  Hearing is normal as tested by gross conversation however pt reports some loss of hearing Palate elevates midline, normal phonation  Shoulder shrug strength is intact  Tongue protrudes midline   MUSCULOSKELETAL: Tremor: None Bulk: Normal Tone: Normal Facial Symmetry: Face appears grossly symmetrical  Posture Moderate to severe upper thoracic kyphosis with forward head/neck posture  Cervical Screen Centralization: Deferred Isometrics: Full and painless in all directions Passive Accessory Intervertebral Motion (PAIVM), central and unilateral (bilaterally): Deferred  Spurlings A (ipsilateral lateral flexion/axial  compression): R: Not examined L: Not examined Spurlings B (ipsilateral lateral flexion/contralateral rotation/axial compression): R: Not examined L: Not examined Cervical Distraction Test: Not examined  Cervical Fexion-Rotation Test: Not examined  Hoffman Sign (cervical cord compression): R: Not examined L: Not examined  AROM AROM (Normal range in degrees) AROM  Cervical  Flexion (50) 50* (L upper trap pain)  Extension (80) 30  Right lateral flexion (45) 18* (L sided neck pain)  Left lateral flexion (45) 18* (R sided neck pain)  Right rotation (85) 54  Left rotation (85) 44  (* = pain; Blank rows = not tested)  Dermatome/Myotome Screen N=normal  Ab=abnormal Level Dermatome R L Myotome R L Reflex R L  C2 Posterior Scalp N N Cervical Flexion/Extension C1-2 N N Jaw CN V    C3 Anterior Neck N N Cervical Sidebend C2-3 N N Biceps C5-6    C4 Top of Shoulder N N Shoulder Shrug C4 N N Brachiorad. C5-6    C5 Lateral Upper Arm N N Shoulder ABD C4-5 N N Triceps C7    C6 Lateral Arm/ Thumb N N Arm Flex/ Wrist Ext C5-6 N N     C7 Middle Finger N N Arm Ext//Wrist Flex C6-7 N N     C8 4th & 5th Finger N N Flex/ Ext Carpi Ulnaris C8 N N     T1 Medial Arm N N Interossei T1 N N      Sensation Grossly intact to light touch bilateral face and UE as determined by testing branches of trigeminal nerve and dermatomes C2-T2  Palpation Graded on 0-4 scale (0 = no pain, 1 = pain, 2 = pain with wincing/grimacing/flinching, 3 = pain with withdrawal, 4 = unwilling to allow palpation) Location LEFT  RIGHT           Temporomandibular Joint (posterior, superior, anterior)  1  Temporalis (anterior, middle, posterior fibers)    Temporalis Tendon Insertion  1  Masseter (Zygoma, Body, Lateral surface of angle of mandible)  1  Medial ptyergoid  1  Frontal Sinus    Maxillary Sinus    SCM    Upper Trapezius  1  Subocciptials  1   Mandibular AROM Resting Dental Alignment/Occlusion (Overbite, underbite, overjet,  crossbite): Overbite noted with the R upper molars, crossbite with lower incisors noted to be approximately 13m to the right of the upper incisors.   Mandible Depression (40-522m: 5348mandible Protrusion (3-6mm55m3mm 76mdible Lateral Excursion (10-12mm)65m 8mm  L27m0mm   67mngth R/L Functional Mandible Depression (Jaw Opening): WNL, mild pain Functional Mandible Protrusion: WNL Functional Mandible Elevation (Jaw Closing): WNL Functional Mandible Lateral Deviation: WNL bilaterally  Passive Accessory Joint Motion (PAIVM) (Right) Inferior (Caudal Glide): WNL Anterior Glide: WNL Medial Glide: WNL Lateral Glide: Limited and painful Medial and Lateral Extra-oral Glide: R lateral glide is limited and painful. No issues with lateral glide  Special Tests Biting on one separator (ipsilateral muscle activation/contralateral joint compression): R: Negative L: Positive for R sided TMJ pain; Biting on two separators (bilateral muscle activation): R: Not examined L: Not examined Manual Joint Compression: Positive on the R for pain; Manual Joint Distraction: Negative  Beighton scale: Deferred  MMT MMT (out of 5) Right 06/23/2022 Left 06/23/2022  Cervical (isometric)  Flexion WNL  Extension WNL  Lateral Flexion WNL WNL  Rotation WNL WNL      Shoulder   Flexion 4+ 4+  Extension    Abduction 4+ 4+  Internal  rotation    Horizontal abduction    Horizontal adduction    Lower Trapezius    Rhomboids        Elbow  Flexion 5 5  Extension 5 5  Pronation    Supination        Wrist  Flexion 5 5  Extension 5 5  Radial deviation    Ulnar deviation        MCP  Flexion 5 5  Extension 5 5  Abduction 5 5  Adduction 5 5  (* = pain; Blank rows = not tested)  Reflexes Deferred   TREATMENT   SUBJECTIVE: Pt reports she is doing well today. No resting pain in neck or TMJ upon arrival today. Notes compliance with HEP. Has noticed significant improvement in pain since starting with  therapy. No specific questions currently.    PAIN: Denies   Manual Therapy  Moist heat pack applied to cervical spine and R TMJ during interval history x 5 minutes; Updated outcome measures with patient; Gentle cervical traction 15s hold/15s relax x 2; Lateral flexion, upper trap, and levator stretches x 30s each bilateral; Suboccipital stretch 2 x 30s; Suboccipital release x 60s; Extensive STM to bilateral suboccipitals, R SCM, cervical paraspinals, R upper trap, R masseter (extraoral), and at the R submandibular region near the mandibular angle to target medial ptyergoid;   Ther-ex  Supine repeated cervical retraction with manual overpressure by therapist x 10; Manually resisted cervical rotation and lateral flexion x 10 each bilaterally; Mandibular lateral deviation isometrics 6s contract hold x 6 bilaterally; Mandibular depression isometrics 6s contract x 6; Mandibular elevation isometrics 6s contract x 6;    PATIENT EDUCATION:  Education details: Pt educated throughout session about proper posture and technique with exercises. Improved exercise technique, movement at target joints, use of target muscles after min to mod verbal, visual, tactile cues. Discharge Person educated: Patient Education method: Explanation  Education comprehension: verbalized understanding and returned demonstration   HOME EXERCISE PROGRAM: Access Code: [email or web address] URL: [email or web address] Date: 08/12/2022 Prepared by: Roxana Hires  Exercises - Supine Chin Tuck  - 6 x daily - 7 x weekly - 6 reps - 6s hold - Seated Scapular Retraction  - 6 x daily - 7 x weekly - 6 reps - 6s hold - Isometric Jaw Deviation  - 6 x daily - 7 x weekly - 6 reps - 6s hold - Isometric Jaw Abduction  - 6 x daily - 7 x weekly - 6 reps - 6s hold - Isometric Jaw Adduction  - 6 x daily - 7 x weekly - 6 reps - 5s hold - Tongue Clicks TMJ  - 6 x daily - 7 x weekly - 6 reps   ASSESSMENT:  CLINICAL  IMPRESSION: Continued manual techniques including extensive STM to bilateral suboccipitals, SCM, cervical paraspinals, and upper traps. Deferred trigger point dry needling today but continued cervical strengthening. Updated outcome measures/goals today. Her FOTO improved from 50 to 74 when it was last updated and her NDI decreased from 28% to 26%. Her worst TMJ pain over the last week was 1/10 compared to 7/10 when first starting therapy. She is independent with her HEP and is ready to discharge.    REHAB POTENTIAL: Good  CLINICAL DECISION MAKING: Unstable/unpredictable  EVALUATION COMPLEXITY: High   GOALS: Goals reviewed with patient? Yes  SHORT TERM GOALS: Target date: 07/21/2022  Pt will be independent with HEP to improve strength and decrease R TMJ pain in order to improve pain-free function at  home. Baseline:  Goal status: ACHIEVED   LONG TERM GOALS: Target date: 08/18/2022  Pt will increase FOTO to at least 54 to demonstrate significant improvement in function at home and work related to jaw pain. Baseline: 06/23/22: 50; 07/27/22: Deferred; 07/29/22: 56; Goal status: ACHIEVED  2.  Pt will decrease worst TMJ pain by at least 3 points on the NPRS in order to demonstrate clinically significant reduction in TMJ pain. Baseline: 06/23/22: worst: 7/10; 07/27/22: worst: 3/10; 08/12/22: 1/10; Goal status: ACHIEVED  3.  Pt will decrease NDI score by at least 19% in order demonstrate clinically significant reduction in neck pain/disability.       Baseline: 06/23/22: To be completed; 06/25/22: 28%; 07/27/22: Deferred; 07/29/22: 26% Goal status: PARTIALLY MET   PLAN: PT FREQUENCY: 2x/week  PT DURATION: 8 weeks  PLANNED INTERVENTIONS: Therapeutic exercises, Therapeutic activity, Neuromuscular re-education, Balance training, Gait training, Patient/Family education, Joint manipulation, Joint mobilization, Vestibular training, Canalith repositioning, Dry Needling, Electrical stimulation, Spinal  manipulation, Spinal mobilization, Cryotherapy, Moist heat, Taping, Traction, Ultrasound, Ionotophoresis '4mg'$ /ml Dexamethasone, and Manual therapy  PLAN FOR NEXT SESSION: Discharge  Lyndel Safe Anasophia Pecor PT, DPT, GCS  Eudelia Hiltunen 08/13/2022, 12:29 PM

## 2022-08-12 ENCOUNTER — Ambulatory Visit: Payer: Medicare Other

## 2022-08-12 DIAGNOSIS — M542 Cervicalgia: Secondary | ICD-10-CM

## 2022-09-23 DIAGNOSIS — I7121 Aneurysm of the ascending aorta, without rupture: Secondary | ICD-10-CM | POA: Diagnosis not present

## 2022-09-23 DIAGNOSIS — I4891 Unspecified atrial fibrillation: Secondary | ICD-10-CM | POA: Diagnosis not present

## 2022-09-23 DIAGNOSIS — I4892 Unspecified atrial flutter: Secondary | ICD-10-CM | POA: Diagnosis not present

## 2022-09-23 DIAGNOSIS — I724 Aneurysm of artery of lower extremity: Secondary | ICD-10-CM | POA: Diagnosis not present

## 2022-09-23 DIAGNOSIS — I700 Atherosclerosis of aorta: Secondary | ICD-10-CM | POA: Diagnosis not present

## 2022-09-23 DIAGNOSIS — R0989 Other specified symptoms and signs involving the circulatory and respiratory systems: Secondary | ICD-10-CM | POA: Diagnosis not present

## 2022-09-23 DIAGNOSIS — R053 Chronic cough: Secondary | ICD-10-CM | POA: Diagnosis not present

## 2022-10-06 DIAGNOSIS — H9313 Tinnitus, bilateral: Secondary | ICD-10-CM | POA: Diagnosis not present

## 2022-10-06 DIAGNOSIS — H6063 Unspecified chronic otitis externa, bilateral: Secondary | ICD-10-CM | POA: Diagnosis not present

## 2022-10-07 DIAGNOSIS — Z85828 Personal history of other malignant neoplasm of skin: Secondary | ICD-10-CM | POA: Diagnosis not present

## 2022-10-07 DIAGNOSIS — L578 Other skin changes due to chronic exposure to nonionizing radiation: Secondary | ICD-10-CM | POA: Diagnosis not present

## 2022-10-07 DIAGNOSIS — Z872 Personal history of diseases of the skin and subcutaneous tissue: Secondary | ICD-10-CM | POA: Diagnosis not present

## 2022-10-07 DIAGNOSIS — D492 Neoplasm of unspecified behavior of bone, soft tissue, and skin: Secondary | ICD-10-CM | POA: Diagnosis not present

## 2022-10-07 DIAGNOSIS — Z86018 Personal history of other benign neoplasm: Secondary | ICD-10-CM | POA: Diagnosis not present

## 2022-11-13 ENCOUNTER — Ambulatory Visit: Payer: Self-pay | Admitting: *Deleted

## 2022-11-13 NOTE — Telephone Encounter (Signed)
  Chief Complaint: episode of low BP and facial swelling and lymph nodes swelling behind right ear on Sunday  Symptoms: no sx now. Reports she had right side of face felt like "mumps" and lymph node swelling behind right ear jaw pain and difficulty opening jaw on Saturday and Sunday . BP was 86/56. Headache . No fever. Did not recheck for NT during call but reports last normal . Report BP runs low.  Frequency: 5/25-26/24 Pertinent Negatives: Patient denies fever, no swelling no pain no low BP  Disposition: [] ED /[] Urgent Care (no appt availability in office) / [x] Appointment(In office/virtual)/ []  [facility]/ [] Home Care/ [] Refused Recommended Disposition /[] [facility]/ []  Follow-up with PCP Additional Notes:   Appt scheduled for 11/17/22. Recommended if sx return or worsen go to UC/ED over weekend     Reason for Disposition  Wants doctor to measure BP  Answer Assessment - Initial Assessment Questions 1. BLOOD PRESSURE: "What is the blood pressure?" "Did you take at least two measurements 5 minutes apart?"     Did not give reading for today reports "normal today". Sunday BP 86/56 2. On SET: "When did you take your blood pressure?"     Sunday 11/08/22 3. HOW: "How did you obtain the blood pressure?" (e.g., visiting nurse, automatic home BP monitor)     Home monitor 4. HISTORY: "Do you have a history of low blood pressure?" "What is your blood pressure normally?"     Yes  5. MEDICINES: "Are you taking any medications for blood pressure?" If Yes, ask: "Have they been changed recently?"     Na  6. PULSE RATE: "Do you know what your pulse rate is?"      Na  7. OTHER SYMPTOMS: "Have you been sick recently?" "Have you had a recent injury?"     On Saturday noted right side of face looked like 'mumps" and behind right ear to jaw painful. Felt like swollen lymph nodes and difficulty opening jaw. Better now no issues. Did have headache and BP 122/80 on Saturday and Sunday  8.  PREGNANCY: "Is there any chance you are pregnant?" "When was your last menstrual period?"     na  Protocols used: Blood Pressure - Low-A-AH

## 2022-11-17 ENCOUNTER — Ambulatory Visit (INDEPENDENT_AMBULATORY_CARE_PROVIDER_SITE_OTHER): Payer: Medicare Other | Admitting: Internal Medicine

## 2022-11-17 ENCOUNTER — Encounter: Payer: Self-pay | Admitting: Internal Medicine

## 2022-11-17 VITALS — BP 128/64 | HR 61 | Ht 63.0 in | Wt 166.0 lb

## 2022-11-17 DIAGNOSIS — I959 Hypotension, unspecified: Secondary | ICD-10-CM

## 2022-11-17 DIAGNOSIS — I700 Atherosclerosis of aorta: Secondary | ICD-10-CM | POA: Diagnosis not present

## 2022-11-17 DIAGNOSIS — M26621 Arthralgia of right temporomandibular joint: Secondary | ICD-10-CM | POA: Diagnosis not present

## 2022-11-17 DIAGNOSIS — I480 Paroxysmal atrial fibrillation: Secondary | ICD-10-CM

## 2022-11-17 DIAGNOSIS — D6869 Other thrombophilia: Secondary | ICD-10-CM | POA: Diagnosis not present

## 2022-11-17 DIAGNOSIS — K219 Gastro-esophageal reflux disease without esophagitis: Secondary | ICD-10-CM | POA: Diagnosis not present

## 2022-11-17 MED ORDER — FAMOTIDINE 20 MG PO TABS: 20.0000 mg | ORAL_TABLET | Freq: Two times a day (BID) | ORAL | 0 refills | Status: DC

## 2022-11-17 NOTE — Assessment & Plan Note (Signed)
Intermittent self limited episodes. Continue on Xarelto without bleeding issues

## 2022-11-17 NOTE — Assessment & Plan Note (Signed)
She had excellent response to PT and dry needling

## 2022-11-17 NOTE — Assessment & Plan Note (Signed)
Has stopped omeprazole over concerns of vitamin depletion Will start Pepcid bid

## 2022-11-17 NOTE — Progress Notes (Signed)
Date:  11/17/2022   Name:  Connie Mitchell   DOB:  December 08, 1933   MRN:  [id number]   Chief Complaint: Hypotension (Patient had an episode where she felt weak, and took her BP. It was 86/56. Episode lasted all day. This happened over a week ago.) and Ear Pain (Right ear. Friday to Saturday patient had a swollen lymph node behind right ear. Pain radiated into Jaw Bone. Pain is slightly tender but nothing like it was. )  Otalgia  There is pain in the right ear. This is a new problem. The current episode started in the past 7 days. The problem has been gradually improving. There has been no fever. The patient is experiencing no pain. Associated symptoms include hearing loss. Pertinent negatives include no abdominal pain or coughing.  Gastroesophageal Reflux She complains of heartburn. She reports no abdominal pain, no chest pain, no choking or no coughing. This is a recurrent problem. The problem occurs frequently. Pertinent negatives include no fatigue. She has tried a PPI for the symptoms. The treatment provided moderate relief.   Headache - one day last month started on top of head then moved down the back of her head to the shoulders.  It resolved on its own.  Itching ears - seen by Dr. Elenore Rota.  Tried a hearing aid but it made her itch more.  She has used mometasone cream which has helped.  Right facial swelling - started 10 days ago but has started to gradually improved with no treatment.    Lab Results  Component Value Date   NA 141 12/19/2021   K 4.3 12/19/2021   CO2 22 12/19/2021   GLUCOSE 82 12/19/2021   BUN 17 12/19/2021   CREATININE 0.72 12/19/2021   CALCIUM 9.4 12/19/2021   EGFR 81 12/19/2021   GFRNONAA >60 10/08/2020   Lab Results  Component Value Date   CHOL 179 12/19/2021   HDL 55 12/19/2021   LDLCALC 100 (H) 12/19/2021   TRIG 139 12/19/2021   CHOLHDL 3.3 12/19/2021   Lab Results  Component Value Date   TSH 5.470 (H) 12/19/2021   No results found for:  "HGBA1C" Lab Results  Component Value Date   WBC 6.3 12/19/2021   HGB 14.3 12/19/2021   HCT 43.5 12/19/2021   MCV 95 12/19/2021   PLT CANCELED 12/19/2021   Lab Results  Component Value Date   ALT 15 12/19/2021   AST 17 12/19/2021   ALKPHOS 68 12/19/2021   BILITOT 0.5 12/19/2021   Lab Results  Component Value Date   VD25OH 42.6 12/19/2021     Review of Systems  Constitutional:  Negative for chills, fatigue and fever.  HENT:  Positive for ear pain and hearing loss. Negative for trouble swallowing.   Respiratory:  Negative for cough, choking, chest tightness and shortness of breath.   Cardiovascular:  Negative for chest pain and palpitations.  Gastrointestinal:  Positive for heartburn. Negative for abdominal pain.  Musculoskeletal:  Positive for arthralgias and gait problem.  Psychiatric/Behavioral:  Negative for dysphoric mood and sleep disturbance. The patient is not nervous/anxious.     Patient Active Problem List   Diagnosis Date Noted   Arthralgia of right temporomandibular joint 03/27/2022   Acquired thrombophilia (HCC) 12/19/2021   Abnormal CT of the abdomen 10/01/2021   Polyp of ascending colon 10/01/2021   Chronic cough 08/13/2021   Enteritis 07/14/2020   Rectal bleed 07/14/2020   Paroxysmal atrial fibrillation (HCC) 07/14/2020   Aortic atherosclerosis (HCC) 12/12/2019  Axillary adenopathy 01/01/2019   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HCC) 12/30/2018   Osteopenia of neck of right femur 07/12/2018   Thoracic ascending aortic aneurysm (HCC) 05/27/2018   LPRD (laryngopharyngeal reflux disease) 03/17/2018   Chronic foot pain 11/25/2017   Lumbar degenerative disc disease 09/24/2017   Hx of Lyme disease 09/24/2017   Hx of basal cell carcinoma 09/24/2017   Nuclear sclerotic cataract of left eye 08/09/2015   Osteoporosis 07/04/2014   Primary localized osteoarthrosis of shoulder region 12/13/2012   Osteoarthritis 12/18/2011    Allergies  Allergen Reactions   Advair Hfa  [Fluticasone-Salmeterol] Other (See Comments)    Thrush, flushing and headache   Ciprofloxacin Nausea And Vomiting    Past Surgical History:  Procedure Laterality Date   BREAST BIOPSY Right 02/22/2020   stereo bx, ribbon clip, path pending    BREAST LUMPECTOMY Left 1991   breast ca, lumpectomy   cataracts Bilateral    COLONOSCOPY WITH PROPOFOL N/A 04/28/2021   Procedure: COLONOSCOPY WITH BIOPSY;  Surgeon: Midge Minium, MD;  Location: [facility];  Service: Endoscopy;  Laterality: N/A;   CYSTOCELE REPAIR  2010   EYE SURGERY Bilateral 2017   cataracts extracted   JOINT REPLACEMENT     MASTECTOMY Left 07/25/2018   Invasive Mammary Carcinoma   MASTECTOMY W/ SENTINEL NODE BIOPSY Left 07/25/2018   Procedure: MASTECTOMY WITH SENTINEL LYMPH NODE BIOPSY;  Surgeon: Henrene Dodge, MD;  Location: [facility];  Service: General;  Laterality: Left;   OOPHORECTOMY  1995   ovarian mass - benign   POLYPECTOMY N/A 04/28/2021   Procedure: POLYPECTOMY;  Surgeon: Midge Minium, MD;  Location: [facility];  Service: Endoscopy;  Laterality: N/A;   REPLACEMENT TOTAL KNEE Bilateral 2012,2013   SKIN BIOPSY Left    arm growth   TONSILLECTOMY  1958   TOTAL SHOULDER ARTHROPLASTY Right 2016    Social History   Tobacco Use   Smoking status: Never   Smokeless tobacco: Never   Tobacco comments:    smoking cessation materials not required  Vaping Use   Vaping Use: Never used  Substance Use Topics   Alcohol use: Yes    Comment: occasional beer or wine   Drug use: Never     Medication list has been reviewed and updated.  Current Meds  Medication Sig   Calcium Carbonate-Vitamin D (CALCIUM-VITAMIN D) 600-125 MG-UNIT TABS Take 1 tablet by mouth 2 (two) times a day.    famotidine (PEPCID) 20 MG tablet Take 1 tablet (20 mg total) by mouth 2 (two) times daily.   Multiple Vitamins-Minerals (OCUVITE ADULT 50+) CAPS    XARELTO 20 MG TABS tablet Take 20 mg by mouth at bedtime.        11/17/2022    1:24 PM 03/27/2022   10:36 AM 12/19/2021    8:48 AM 02/05/2021   10:16 AM  GAD 7 : Generalized Anxiety Score  Nervous, Anxious, on Edge 0 0 0 0  Control/stop worrying 0 0 0 0  Worry too much - different things 0 0 0 0  Trouble relaxing 0 0 0 0  Restless 0 0 0 0  Easily annoyed or irritable 0 0 1 0  Afraid - awful might happen 0 0 0 0  Total GAD 7 Score 0 0 1 0  Anxiety Difficulty Not difficult at all Not difficult at all Not difficult at all Not difficult at all       11/17/2022    1:24 PM 03/27/2022   10:36 AM 12/19/2021  8:48 AM  Depression screen PHQ 2/9  Decreased Interest 0 0 0  Down, Depressed, Hopeless 0 0 0  PHQ - 2 Score 0 0 0  Altered sleeping 0 0 1  Tired, decreased energy 0 0 1  Change in appetite 0 0 0  Feeling bad or failure about yourself  0 0 0  Trouble concentrating 0 0 0  Moving slowly or fidgety/restless 0 0 0  Suicidal thoughts 0 0 0  PHQ-9 Score 0 0 2  Difficult doing work/chores Not difficult at all Not difficult at all Not difficult at all    BP Readings from Last 3 Encounters:  11/17/22 128/64  03/27/22 110/62  02/09/22 (!) 153/86    Physical Exam Vitals and nursing note reviewed.  Constitutional:      General: She is not in acute distress.    Appearance: Normal appearance. She is well-developed.  HENT:     Head: Normocephalic and atraumatic.     Right Ear: Tympanic membrane and ear canal normal.     Left Ear: Tympanic membrane and ear canal normal.  Cardiovascular:     Rate and Rhythm: Normal rate and regular rhythm.  Pulmonary:     Effort: Pulmonary effort is normal. No respiratory distress.     Breath sounds: No wheezing or rhonchi.  Musculoskeletal:     Cervical back: Normal range of motion.     Right lower leg: No edema.     Left lower leg: No edema.  Lymphadenopathy:     Cervical: No cervical adenopathy.  Skin:    General: Skin is warm and dry.     Findings: No rash.  Neurological:     General: No focal deficit  present.     Mental Status: She is alert and oriented to person, place, and time.     Gait: Gait abnormal.  Psychiatric:        Mood and Affect: Mood normal.        Behavior: Behavior normal.     Wt Readings from Last 3 Encounters:  11/17/22 166 lb (75.3 kg)  03/27/22 159 lb (72.1 kg)  02/09/22 161 lb (73 kg)    BP 128/64   Pulse 61   Ht 5\' 3"  (1.6 m)   Wt 166 lb (75.3 kg)   SpO2 98%   BMI 29.41 kg/m   Assessment and Plan:  Problem List Items Addressed This Visit     Paroxysmal atrial fibrillation (HCC) (Chronic)    Intermittent self limited episodes. Continue on Xarelto without bleeding issues      LPRD (laryngopharyngeal reflux disease) (Chronic)    Has stopped omeprazole over concerns of vitamin depletion Will start Pepcid bid      Relevant Medications   famotidine (PEPCID) 20 MG tablet   Arthralgia of right temporomandibular joint - Primary    She had excellent response to PT and dry needling      Aortic atherosclerosis (HCC) (Chronic)   Acquired thrombophilia (HCC) (Chronic)   Other Visit Diagnoses     Hypotension, unspecified hypotension type       now resolved - responded to exercise with increase in BP to 110 systolic       No follow-ups on file.   Partially dictated using Dragon software, any errors are not intentional.  Reubin Milan, MD [facility] [place], [place]

## 2022-12-09 ENCOUNTER — Telehealth (INDEPENDENT_AMBULATORY_CARE_PROVIDER_SITE_OTHER): Payer: Self-pay

## 2022-12-09 ENCOUNTER — Ambulatory Visit (INDEPENDENT_AMBULATORY_CARE_PROVIDER_SITE_OTHER): Payer: Medicare Other

## 2022-12-09 VITALS — BP 115/71 | Ht 63.0 in | Wt 166.0 lb

## 2022-12-09 DIAGNOSIS — Z0000 Encounter for general adult medical examination without abnormal findings: Secondary | ICD-10-CM | POA: Diagnosis not present

## 2022-12-09 NOTE — Telephone Encounter (Signed)
I called pt to let her know it was time to have a CT done and to see Dr Gilda Crease in July she stated understanding and ask that I call back and leave a detailed message on her VM so I did and she will call our office after she schedules her CT to scheduled appt with [facility]

## 2022-12-09 NOTE — Progress Notes (Signed)
Subjective:   Connie Mitchell is a 89 y.o. female who presents for Medicare Annual (Subsequent) preventive examination.  Visit Complete: Virtual  I connected with  [facility] on 12/09/22 by a audio enabled telemedicine application and verified that I am speaking with the correct person using two identifiers.  Patient Location: Home  Provider Location: Office/Clinic  I discussed the limitations of evaluation and management by telemedicine. The patient expressed understanding and agreed to proceed.  Review of Systems     Cardiac Risk Factors include: advanced age (>96men, >29 women)     Objective:    Today's Vitals   12/09/22 0818 12/09/22 0826 12/09/22 0845  BP:   115/71  Weight:   166 lb (75.3 kg)  Height: 5\' 3"  (1.6 m)  5\' 3"  (1.6 m)  PainSc:  4     Body mass index is 29.41 kg/m.     12/09/2022    8:34 AM 12/01/2021   10:44 AM 10/08/2021   11:02 AM 04/28/2021    8:23 AM 04/19/2021   10:44 AM 10/08/2020    1:15 PM 07/14/2020    4:55 PM  Advanced Directives  Does Patient Have a Medical Advance Directive? No No Yes No No No No  Type of Surveyor, minerals;Living will      Would patient like information on creating a medical advance directive? No - Patient declined No - Patient declined  No - Patient declined       Current Medications (verified) Outpatient Encounter Medications as of 12/09/2022  Medication Sig   Calcium Carbonate-Vitamin D (CALCIUM-VITAMIN D) 600-125 MG-UNIT TABS Take 1 tablet by mouth 2 (two) times a day.    famotidine (PEPCID) 20 MG tablet Take 1 tablet (20 mg total) by mouth 2 (two) times daily.   Multiple Vitamins-Minerals (OCUVITE ADULT 50+) CAPS    XARELTO 20 MG TABS tablet Take 20 mg by mouth at bedtime.   No facility-administered encounter medications on file as of 12/09/2022.    Allergies (verified) Advair hfa [fluticasone-salmeterol] and Ciprofloxacin   History: Past Medical History:  Diagnosis Date   A-fib  (HCC)    Allergy    Anxiety    Cancer (HCC) 1991   left breast. treated with rads and lumpectomy   Cancer (HCC) 2020   newly diagnosed, also left breast   Cataract 2017   Cellulitis and abscess of toe of left foot 01/2018   Dental bridge present    permanent - bottom   GERD (gastroesophageal reflux disease)    Heart murmur 2017   Afib 2022   Hx of therapeutic radiation 1991   left breast   Laryngopharyngeal reflux    sometimes food goes down wrong way and she ends up in extreme coughing/choking scenario   Personal history of radiation therapy 1991   left breast ca   Primary osteoarthritis    Thoracic aortic aneurysm (TAA) (HCC)    Past Surgical History:  Procedure Laterality Date   BREAST BIOPSY Right 02/22/2020   stereo bx, ribbon clip, path pending    BREAST LUMPECTOMY Left 1991   breast ca, lumpectomy   cataracts Bilateral    COLONOSCOPY WITH PROPOFOL N/A 04/28/2021   Procedure: COLONOSCOPY WITH BIOPSY;  Surgeon: Midge Minium, MD;  Location: [facility];  Service: Endoscopy;  Laterality: N/A;   CYSTOCELE REPAIR  2010   EYE SURGERY Bilateral 2017   cataracts extracted   JOINT REPLACEMENT     MASTECTOMY Left 07/25/2018  Invasive Mammary Carcinoma   MASTECTOMY W/ SENTINEL NODE BIOPSY Left 07/25/2018   Procedure: MASTECTOMY WITH SENTINEL LYMPH NODE BIOPSY;  Surgeon: Henrene Dodge, MD;  Location: [facility];  Service: General;  Laterality: Left;   OOPHORECTOMY  1995   ovarian mass - benign   POLYPECTOMY N/A 04/28/2021   Procedure: POLYPECTOMY;  Surgeon: Midge Minium, MD;  Location: [facility];  Service: Endoscopy;  Laterality: N/A;   REPLACEMENT TOTAL KNEE Bilateral 2012,2013   SKIN BIOPSY Left    arm growth   TONSILLECTOMY  1958   TOTAL SHOULDER ARTHROPLASTY Right 2016   Family History  Problem Relation Age of Onset   Hypertension Mother    Alzheimer's disease Mother    Hearing loss Mother    Stroke Father    Hearing loss Father    Cancer  Brother        prostate, rectal cancer   Atrial fibrillation Brother    Hearing loss Brother    Atrial fibrillation Brother    Stroke Maternal Uncle    Breast cancer Cousin 72       paternal x 2   Cancer Other        breast   Social History   Socioeconomic History   Marital status: Single    Spouse name: Not on file   Number of children: 0   Years of education: Not on file   Highest education level: Professional school degree (e.g., MD, DDS, DVM, JD)  Occupational History   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    Comment: retired  Tobacco Use   Smoking status: Never   Smokeless tobacco: Never   Tobacco comments:    smoking cessation materials not required  Vaping Use   Vaping Use: Never used  Substance and Sexual Activity   Alcohol use: Yes    Comment: occasional beer or wine   Drug use: Never   Sexual activity: Not Currently    Birth control/protection: None  Other Topics Concern   Not on file  Social History Narrative   Pt lives with business partner. Owner of [employer]   Social Determinants of Health   Financial Resource Strain: Low Risk  (12/09/2022)   Overall Financial Resource Strain (CARDIA)    Difficulty of Paying Living Expenses: Not very hard  Recent Concern: Financial Resource Strain - Medium Risk (11/13/2022)   Overall Financial Resource Strain (CARDIA)    Difficulty of Paying Living Expenses: Somewhat hard  Food Insecurity: No Food Insecurity (12/09/2022)   Hunger Vital Sign    Worried About Running Out of Food in the Last Year: Never true    Ran Out of Food in the Last Year: Never true  Transportation Needs: No Transportation Needs (12/09/2022)   PRAPARE - Administrator, Civil Service (Medical): No    Lack of Transportation (Non-Medical): No  Physical Activity: Sufficiently Active (12/09/2022)   Exercise Vital Sign    Days of Exercise per Week: 7 days    Minutes of Exercise per Session: 60 min  Stress: No Stress  Concern Present (12/09/2022)   [employer] of Occupational Health - Occupational Stress Questionnaire    Feeling of Stress : Only a little  Recent Concern: Stress - Stress Concern Present (11/13/2022)   [employer] of Occupational Health - Occupational Stress Questionnaire    Feeling of Stress : To some extent  Social Connections: Moderately Isolated (12/09/2022)   Social Connection and Isolation Panel [NHANES]  Frequency of Communication with Friends and Family: More than three times a week    Frequency of Social Gatherings with Friends and Family: Never    Attends Religious Services: Never    Database administrator or Organizations: Yes    Attends Banker Meetings: Never    Marital Status: Never married    Tobacco Counseling Counseling given: Not Answered Tobacco comments: smoking cessation materials not required   Clinical Intake:  Pre-visit preparation completed: Yes  Pain : 0-10 Pain Score: 4  Pain Type: Chronic pain Pain Location: Leg Pain Orientation: Right     Nutritional Risks: None Diabetes: No  How often do you need to have someone help you when you read instructions, pamphlets, or other written materials from your doctor or pharmacy?: 1 - Never  Interpreter Needed?: No  Information entered by :: Kennedy Bucker, LPN   Activities of Daily Living    12/09/2022    8:27 AM 12/05/2022   10:05 AM  In your present state of health, do you have any difficulty performing the following activities:  Hearing? 1 1  Comment has ordered hearing aids   Vision? 0 0  Comment wears readers   Difficulty concentrating or making decisions? 0 0  Walking or climbing stairs? 1 1  Dressing or bathing? 0 0  Doing errands, shopping? 0   Preparing Food and eating ? N N  Using the Toilet? N N  In the past six months, have you accidently leaked urine? N Y  Do you have problems with loss of bowel control? N N  Managing your Medications? N N  Managing your  Finances? N N  Housekeeping or managing your Housekeeping? N Y    Patient Care Team: Reubin Milan, MD as PCP - General (Internal Medicine) Callie Fielding, MD as Consulting Physician (Physical Medicine and Rehabilitation) Arman Bogus, MD as Consulting Physician (Neurosurgery) Salena Saner, MD as Consulting Physician (Pulmonary Disease) Henrene Dodge, MD as Consulting Physician (General Surgery)  Indicate any recent Medical Services you may have received from other than [facility] providers in the past year (date may be approximate).     Assessment:   This is a routine wellness examination for Connie Mitchell.  Hearing/Vision screen Hearing Screening - Comments:: Has hearing aids on order  Vision Screening - Comments:: Wears readers - Dr.Porfilio  Dietary issues and exercise activities discussed:     Goals Addressed             This Visit's Progress    DIET - EAT MORE FRUITS AND VEGETABLES         Depression Screen    12/09/2022    8:32 AM 11/17/2022    1:24 PM 03/27/2022   10:36 AM 12/19/2021    8:48 AM 12/01/2021   10:41 AM 02/05/2021   10:15 AM 12/17/2020    8:46 AM  PHQ 2/9 Scores  PHQ - 2 Score 0 0 0 0 0 0 0  PHQ- 9 Score 0 0 0 2  2 2     Fall Risk    12/09/2022    8:34 AM 12/05/2022   10:05 AM 11/17/2022    1:23 PM 03/27/2022   10:36 AM 12/19/2021    8:48 AM  Fall Risk   Falls in the past year? 0 0 0 0 1  Number falls in past yr: 0  0 0 1  Injury with Fall? 0  0 0 0  Risk for fall due to : No Fall  Risks  No Fall Risks No Fall Risks History of fall(s)  Follow up Falls prevention discussed;Falls evaluation completed  Falls evaluation completed Falls evaluation completed Falls evaluation completed    MEDICARE RISK AT HOME:  Medicare Risk at Home - 12/09/22 0835     Any stairs in or around the home? Yes    If so, are there any without handrails? Yes    Home free of loose throw rugs in walkways, pet beds, electrical cords, etc? Yes    Adequate lighting in your  home to reduce risk of falls? Yes    Life alert? No    Use of a cane, walker or w/c? Yes   rollator all the time   Grab bars in the bathroom? No   rollator all the time   Shower chair or bench in shower? Yes    Elevated toilet seat or a handicapped toilet? No             TIMED UP AND GO:  Was the test performed?  No    Cognitive Function:        12/09/2022    8:37 AM 11/27/2019   11:08 AM 11/21/2018    2:22 PM 11/17/2017    2:46 PM  6CIT Screen  What Year? 0 points 0 points 0 points 0 points  What month? 0 points 0 points 0 points 0 points  What time? 0 points 0 points 0 points 0 points  Count back from 20 0 points 0 points 0 points 0 points  Months in reverse 0 points 0 points 0 points 0 points  Repeat phrase 0 points 0 points 0 points 0 points  Total Score 0 points 0 points 0 points 0 points    Immunizations Immunization History  Administered Date(s) Administered   Influenza Split 06/28/2012   Influenza, High Dose Seasonal PF 03/14/2018   Influenza-Unspecified 04/15/2020   PFIZER(Purple Top)SARS-COV-2 Vaccination 08/04/2019, 08/25/2019, 03/29/2020, 01/04/2021   Pneumococcal Conjugate-13 07/04/2014   Pneumococcal Polysaccharide-23 06/16/2015   Tdap 03/11/2012   Zoster, Live 02/25/2016    TDAP status: Due, Education has been provided regarding the importance of this vaccine. Advised may receive this vaccine at local pharmacy or Health Dept. Aware to provide a copy of the vaccination record if obtained from local pharmacy or Health Dept. Verbalized acceptance and understanding.  Flu Vaccine status: Declined, Education has been provided regarding the importance of this vaccine but patient still declined. Advised may receive this vaccine at local pharmacy or Health Dept. Aware to provide a copy of the vaccination record if obtained from local pharmacy or Health Dept. Verbalized acceptance and understanding.  Pneumococcal vaccine status: Up to date  Covid-19 vaccine  status: Completed vaccines  Qualifies for Shingles Vaccine? Yes   Zostavax completed Yes   Shingrix Completed?: No.    Education has been provided regarding the importance of this vaccine. Patient has been advised to call insurance company to determine out of pocket expense if they have not yet received this vaccine. Advised may also receive vaccine at local pharmacy or Health Dept. Verbalized acceptance and understanding.  Screening Tests Health Maintenance  Topic Date Due   Zoster Vaccines- Shingrix (1 of 2) Never done   COVID-19 Vaccine (5 - 2023-24 season) 02/13/2022   DTaP/Tdap/Td (2 - Td or Tdap) 03/11/2022   INFLUENZA VACCINE  01/14/2023   MAMMOGRAM  02/03/2023   Medicare Annual Wellness (AWV)  12/09/2023   Pneumonia Vaccine 14+ Years old  Completed   DEXA SCAN  Completed   HPV VACCINES  Aged Out    Health Maintenance  Health Maintenance Due  Topic Date Due   Zoster Vaccines- Shingrix (1 of 2) Never done   COVID-19 Vaccine (5 - 2023-24 season) 02/13/2022   DTaP/Tdap/Td (2 - Td or Tdap) 03/11/2022    Colorectal cancer screening: No longer required.   Mammogram status: No longer required due to age.    Lung Cancer Screening: (Low Dose CT Chest recommended if Age 87-80 years, 20 pack-year currently smoking OR have quit w/in 15years.) does not qualify.    Additional Screening:  Hepatitis C Screening: does not qualify; Completed no  Vision Screening: Recommended annual ophthalmology exams for early detection of glaucoma and other disorders of the eye. Is the patient up to date with their annual eye exam?  Yes  Who is the provider or what is the name of the office in which the patient attends annual eye exams? Dr.Porfilio If pt is not established with a provider, would they like to be referred to a provider to establish care? No .   Dental Screening: Recommended annual dental exams for proper oral hygiene    Community Resource Referral / Chronic Care  Management: CRR required this visit?  No   CCM required this visit?  No     Plan:     I have personally reviewed and noted the following in the patient's chart:   Medical and social history Use of alcohol, tobacco or illicit drugs  Current medications and supplements including opioid prescriptions. Patient is not currently taking opioid prescriptions. Functional ability and status Nutritional status Physical activity Advanced directives List of other physicians Hospitalizations, surgeries, and ER visits in previous 12 months Vitals Screenings to include cognitive, depression, and falls Referrals and appointments  In addition, I have reviewed and discussed with patient certain preventive protocols, quality metrics, and best practice recommendations. A written personalized care plan for preventive services as well as general preventive health recommendations were provided to patient.     Hal Hope, LPN   09/21/8117   After Visit Summary: (MyChart) Due to this being a telephonic visit, the after visit summary with patients personalized plan was offered to patient via MyChart   Nurse Notes: none

## 2022-12-09 NOTE — Patient Instructions (Signed)
Connie Mitchell , Thank you for taking time to come for your Medicare Wellness Visit. I appreciate your ongoing commitment to your health goals. Please review the following plan we discussed and let me know if I can assist you in the future.   These are the goals we discussed:  Goals      DIET - EAT MORE FRUITS AND VEGETABLES     DIET - INCREASE WATER INTAKE     Recommend to drink at least 6-8 8oz glasses of water per day.     Patient Stated     Continue to lose weight         This is a list of the screening recommended for you and due dates:  Health Maintenance  Topic Date Due   Zoster (Shingles) Vaccine (1 of 2) Never done   COVID-19 Vaccine (5 - 2023-24 season) 02/13/2022   DTaP/Tdap/Td vaccine (2 - Td or Tdap) 03/11/2022   Flu Shot  01/14/2023   Mammogram  02/03/2023   Medicare Annual Wellness Visit  12/09/2023   Pneumonia Vaccine  Completed   DEXA scan (bone density measurement)  Completed   HPV Vaccine  Aged Out    Advanced directives: no  Conditions/risks identified: none  Next appointment: Follow up in one year for your annual wellness visit 12/15/23 @ 8:15 am by phone   Preventive Care 65 Years and Older, Female Preventive care refers to lifestyle choices and visits with your health care provider that can promote health and wellness. What does preventive care include? A yearly physical exam. This is also called an annual well check. Dental exams once or twice a year. Routine eye exams. Ask your health care provider how often you should have your eyes checked. Personal lifestyle choices, including: Daily care of your teeth and gums. Regular physical activity. Eating a healthy diet. Avoiding tobacco and drug use. Limiting alcohol use. Practicing safe sex. Taking low-dose aspirin every day. Taking vitamin and mineral supplements as recommended by your health care provider. What happens during an annual well check? The services and screenings done by your health  care provider during your annual well check will depend on your age, overall health, lifestyle risk factors, and family history of disease. Counseling  Your health care provider may ask you questions about your: Alcohol use. Tobacco use. Drug use. Emotional well-being. Home and relationship well-being. Sexual activity. Eating habits. History of falls. Memory and ability to understand (cognition). Work and work Astronomer. Reproductive health. Screening  You may have the following tests or measurements: Height, weight, and BMI. Blood pressure. Lipid and cholesterol levels. These may be checked every 5 years, or more frequently if you are over 76 years old. Skin check. Lung cancer screening. You may have this screening every year starting at age 52 if you have a 30-pack-year history of smoking and currently smoke or have quit within the past 15 years. Fecal occult blood test (FOBT) of the stool. You may have this test every year starting at age 37. Flexible sigmoidoscopy or colonoscopy. You may have a sigmoidoscopy every 5 years or a colonoscopy every 10 years starting at age 50. Hepatitis C blood test. Hepatitis B blood test. Sexually transmitted disease (STD) testing. Diabetes screening. This is done by checking your blood sugar (glucose) after you have not eaten for a while (fasting). You may have this done every 1-3 years. Bone density scan. This is done to screen for osteoporosis. You may have this done starting at age 25. Mammogram.  This may be done every 1-2 years. Talk to your health care provider about how often you should have regular mammograms. Talk with your health care provider about your test results, treatment options, and if necessary, the need for more tests. Vaccines  Your health care provider may recommend certain vaccines, such as: Influenza vaccine. This is recommended every year. Tetanus, diphtheria, and acellular pertussis (Tdap, Td) vaccine. You may need a Td  booster every 10 years. Zoster vaccine. You may need this after age 39. Pneumococcal 13-valent conjugate (PCV13) vaccine. One dose is recommended after age 68. Pneumococcal polysaccharide (PPSV23) vaccine. One dose is recommended after age 34. Talk to your health care provider about which screenings and vaccines you need and how often you need them. This information is not intended to replace advice given to you by your health care provider. Make sure you discuss any questions you have with your health care provider. Document Released: 06/28/2015 Document Revised: 02/19/2016 Document Reviewed: 04/02/2015 Elsevier Interactive Patient Education  2017 [employer] Prevention in the Home Falls can cause injuries. They can happen to people of all ages. There are many things you can do to make your home safe and to help prevent falls. What can I do on the outside of my home? Regularly fix the edges of walkways and driveways and fix any cracks. Remove anything that might make you trip as you walk through a door, such as a raised step or threshold. Trim any bushes or trees on the path to your home. Use bright outdoor lighting. Clear any walking paths of anything that might make someone trip, such as rocks or tools. Regularly check to see if handrails are loose or broken. Make sure that both sides of any steps have handrails. Any raised decks and porches should have guardrails on the edges. Have any leaves, snow, or ice cleared regularly. Use sand or salt on walking paths during winter. Clean up any spills in your garage right away. This includes oil or grease spills. What can I do in the bathroom? Use night lights. Install grab bars by the toilet and in the tub and shower. Do not use towel bars as grab bars. Use non-skid mats or decals in the tub or shower. If you need to sit down in the shower, use a plastic, non-slip stool. Keep the floor dry. Clean up any water that spills on the floor  as soon as it happens. Remove soap buildup in the tub or shower regularly. Attach bath mats securely with double-sided non-slip rug tape. Do not have throw rugs and other things on the floor that can make you trip. What can I do in the bedroom? Use night lights. Make sure that you have a light by your bed that is easy to reach. Do not use any sheets or blankets that are too big for your bed. They should not hang down onto the floor. Have a firm chair that has side arms. You can use this for support while you get dressed. Do not have throw rugs and other things on the floor that can make you trip. What can I do in the kitchen? Clean up any spills right away. Avoid walking on wet floors. Keep items that you use a lot in easy-to-reach places. If you need to reach something above you, use a strong step stool that has a grab bar. Keep electrical cords out of the way. Do not use floor polish or wax that makes floors slippery. If you  must use wax, use non-skid floor wax. Do not have throw rugs and other things on the floor that can make you trip. What can I do with my stairs? Do not leave any items on the stairs. Make sure that there are handrails on both sides of the stairs and use them. Fix handrails that are broken or loose. Make sure that handrails are as long as the stairways. Check any carpeting to make sure that it is firmly attached to the stairs. Fix any carpet that is loose or worn. Avoid having throw rugs at the top or bottom of the stairs. If you do have throw rugs, attach them to the floor with carpet tape. Make sure that you have a light switch at the top of the stairs and the bottom of the stairs. If you do not have them, ask someone to add them for you. What else can I do to help prevent falls? Wear shoes that: Do not have high heels. Have rubber bottoms. Are comfortable and fit you well. Are closed at the toe. Do not wear sandals. If you use a stepladder: Make sure that it is  fully opened. Do not climb a closed stepladder. Make sure that both sides of the stepladder are locked into place. Ask someone to hold it for you, if possible. Clearly mark and make sure that you can see: Any grab bars or handrails. First and last steps. Where the edge of each step is. Use tools that help you move around (mobility aids) if they are needed. These include: Canes. Walkers. Scooters. Crutches. Turn on the lights when you go into a dark area. Replace any light bulbs as soon as they burn out. Set up your furniture so you have a clear path. Avoid moving your furniture around. If any of your floors are uneven, fix them. If there are any pets around you, be aware of where they are. Review your medicines with your doctor. Some medicines can make you feel dizzy. This can increase your chance of falling. Ask your doctor what other things that you can do to help prevent falls. This information is not intended to replace advice given to you by your health care provider. Make sure you discuss any questions you have with your health care provider. Document Released: 03/28/2009 Document Revised: 11/07/2015 Document Reviewed: 07/06/2014 Elsevier Interactive Patient Education  2017 [employer].

## 2022-12-18 ENCOUNTER — Ambulatory Visit
Admission: RE | Admit: 2022-12-18 | Discharge: 2022-12-18 | Disposition: A | Discharge status: Home / Self Care | Payer: Medicare Other | Source: Ambulatory Visit | Attending: Vascular Surgery | Admitting: Vascular Surgery

## 2022-12-18 DIAGNOSIS — J439 Emphysema, unspecified: Secondary | ICD-10-CM | POA: Diagnosis not present

## 2022-12-18 DIAGNOSIS — I7121 Aneurysm of the ascending aorta, without rupture: Secondary | ICD-10-CM | POA: Diagnosis not present

## 2022-12-23 ENCOUNTER — Encounter: Payer: Self-pay | Admitting: Internal Medicine

## 2022-12-23 ENCOUNTER — Ambulatory Visit (INDEPENDENT_AMBULATORY_CARE_PROVIDER_SITE_OTHER): Payer: Medicare Other | Admitting: Internal Medicine

## 2022-12-23 VITALS — BP 112/62 | HR 52 | Ht 63.0 in | Wt 166.2 lb

## 2022-12-23 DIAGNOSIS — Z23 Encounter for immunization: Secondary | ICD-10-CM | POA: Diagnosis not present

## 2022-12-23 DIAGNOSIS — R7989 Other specified abnormal findings of blood chemistry: Secondary | ICD-10-CM | POA: Diagnosis not present

## 2022-12-23 DIAGNOSIS — I700 Atherosclerosis of aorta: Secondary | ICD-10-CM | POA: Diagnosis not present

## 2022-12-23 DIAGNOSIS — K219 Gastro-esophageal reflux disease without esophagitis: Secondary | ICD-10-CM | POA: Diagnosis not present

## 2022-12-23 DIAGNOSIS — M85851 Other specified disorders of bone density and structure, right thigh: Secondary | ICD-10-CM | POA: Diagnosis not present

## 2022-12-23 DIAGNOSIS — Z853 Personal history of malignant neoplasm of breast: Secondary | ICD-10-CM

## 2022-12-23 DIAGNOSIS — I480 Paroxysmal atrial fibrillation: Secondary | ICD-10-CM | POA: Diagnosis not present

## 2022-12-23 DIAGNOSIS — I7121 Aneurysm of the ascending aorta, without rupture: Secondary | ICD-10-CM

## 2022-12-23 NOTE — Progress Notes (Signed)
Date:  12/23/2022   Name:  Connie Mitchell   DOB:  06/26/1933   MRN:  [id number]   Chief Complaint: Annual Exam Connie Mitchell is a 89 y.o. female who presents today for her Complete Annual Exam. She feels well. She reports riding an exercise bike daily. She reports she is sleeping fairly well. Breast complaints - none. She gets around with her rolator and is mostly limited by back pain.  Mammogram: 01/2022 DEXA: 01/2022 osteopenia Colonoscopy: 04/2021 TA - no further screenings recommended  Health Maintenance Due  Topic Date Due   Zoster Vaccines- Shingrix (1 of 2) Never done   COVID-19 Vaccine (5 - 2023-24 season) 02/13/2022   DTaP/Tdap/Td (2 - Td or Tdap) 03/11/2022    Immunization History  Administered Date(s) Administered   Influenza Split 06/28/2012   Influenza, High Dose Seasonal PF 03/14/2018   Influenza-Unspecified 04/15/2020   PFIZER(Purple Top)SARS-COV-2 Vaccination 08/04/2019, 08/25/2019, 03/29/2020, 01/04/2021   Pneumococcal Conjugate-13 07/04/2014   Pneumococcal Polysaccharide-23 06/16/2015   Tdap 03/11/2012   Zoster, Live 02/25/2016    Gastroesophageal Reflux She complains of coughing. She reports no abdominal pain, no chest pain or no wheezing. This is a recurrent problem. The problem occurs occasionally. Pertinent negatives include no fatigue. She has tried a histamine-2 antagonist (omeprazole changed to Pepcid) for the symptoms.  Afib - rate controlled, BP normal/low.  On Xarelto without bleeding.   Lab Results  Component Value Date   NA 141 12/19/2021   K 4.3 12/19/2021   CO2 22 12/19/2021   GLUCOSE 82 12/19/2021   BUN 17 12/19/2021   CREATININE 0.72 12/19/2021   CALCIUM 9.4 12/19/2021   EGFR 81 12/19/2021   GFRNONAA >60 10/08/2020   Lab Results  Component Value Date   CHOL 179 12/19/2021   HDL 55 12/19/2021   LDLCALC 100 (H) 12/19/2021   TRIG 139 12/19/2021   CHOLHDL 3.3 12/19/2021   Lab Results  Component Value Date   TSH 5.470 (H) 12/19/2021    No results found for: "HGBA1C" Lab Results  Component Value Date   WBC 6.3 12/19/2021   HGB 14.3 12/19/2021   HCT 43.5 12/19/2021   MCV 95 12/19/2021   PLT CANCELED 12/19/2021   Lab Results  Component Value Date   ALT 15 12/19/2021   AST 17 12/19/2021   ALKPHOS 68 12/19/2021   BILITOT 0.5 12/19/2021   Lab Results  Component Value Date   VD25OH 42.6 12/19/2021     Review of Systems  Constitutional:  Negative for chills, fatigue and fever.  HENT:  Negative for congestion, hearing loss, tinnitus, trouble swallowing and voice change.   Eyes:  Negative for visual disturbance.  Respiratory:  Positive for cough. Negative for chest tightness, shortness of breath and wheezing.   Cardiovascular:  Negative for chest pain, palpitations and leg swelling.  Gastrointestinal:  Negative for abdominal pain, constipation, diarrhea and vomiting.  Endocrine: Negative for polydipsia and polyuria.  Genitourinary:  Negative for dysuria, frequency, genital sores, vaginal bleeding and vaginal discharge.  Musculoskeletal:  Negative for arthralgias, gait problem and joint swelling.  Skin:  Negative for color change and rash.  Neurological:  Negative for dizziness, tremors, light-headedness and headaches.  Hematological:  Negative for adenopathy. Does not bruise/bleed easily.  Psychiatric/Behavioral:  Negative for dysphoric mood and sleep disturbance. The patient is not nervous/anxious.     Patient Active Problem List   Diagnosis Date Noted   Arthralgia of right temporomandibular joint 03/27/2022   Acquired thrombophilia (HCC) 12/19/2021  Polyp of ascending colon 10/01/2021   Chronic cough 08/13/2021   Paroxysmal atrial fibrillation (HCC) 07/14/2020   Aortic atherosclerosis (HCC) 12/12/2019   Axillary adenopathy 01/01/2019   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HCC) 12/30/2018   Osteopenia of neck of right femur 07/12/2018   Thoracic ascending aortic aneurysm (HCC) 05/27/2018   LPRD (laryngopharyngeal reflux  disease) 03/17/2018   Chronic foot pain 11/25/2017   Lumbar degenerative disc disease 09/24/2017   Hx of Lyme disease 09/24/2017   Hx of basal cell carcinoma 09/24/2017   Nuclear sclerotic cataract of left eye 08/09/2015   Osteoporosis 07/04/2014   Primary localized osteoarthrosis of shoulder region 12/13/2012   Osteoarthritis 12/18/2011    Allergies  Allergen Reactions   Advair Hfa [Fluticasone-Salmeterol] Other (See Comments)    Thrush, flushing and headache   Ciprofloxacin Nausea And Vomiting    Past Surgical History:  Procedure Laterality Date   BREAST BIOPSY Right 02/22/2020   stereo bx, ribbon clip, path pending    BREAST LUMPECTOMY Left 1991   breast ca, lumpectomy   cataracts Bilateral    COLONOSCOPY WITH PROPOFOL N/A 04/28/2021   Procedure: COLONOSCOPY WITH BIOPSY;  Surgeon: Midge Minium, MD;  Location: [facility];  Service: Endoscopy;  Laterality: N/A;   CYSTOCELE REPAIR  2010   EYE SURGERY Bilateral 2017   cataracts extracted   JOINT REPLACEMENT     MASTECTOMY Left 07/25/2018   Invasive Mammary Carcinoma   MASTECTOMY W/ SENTINEL NODE BIOPSY Left 07/25/2018   Procedure: MASTECTOMY WITH SENTINEL LYMPH NODE BIOPSY;  Surgeon: Henrene Dodge, MD;  Location: [facility];  Service: General;  Laterality: Left;   OOPHORECTOMY  1995   ovarian mass - benign   POLYPECTOMY N/A 04/28/2021   Procedure: POLYPECTOMY;  Surgeon: Midge Minium, MD;  Location: [facility];  Service: Endoscopy;  Laterality: N/A;   REPLACEMENT TOTAL KNEE Bilateral 2012,2013   SKIN BIOPSY Left    arm growth   TONSILLECTOMY  1958   TOTAL SHOULDER ARTHROPLASTY Right 2016    Social History   Tobacco Use   Smoking status: Never   Smokeless tobacco: Never   Tobacco comments:    smoking cessation materials not required  Vaping Use   Vaping Use: Never used  Substance Use Topics   Alcohol use: Yes    Comment: occasional beer or wine   Drug use: Never     Medication list has been  reviewed and updated.  Current Meds  Medication Sig   Calcium Carbonate-Vitamin D (CALCIUM-VITAMIN D) 600-125 MG-UNIT TABS Take 1 tablet by mouth 2 (two) times a day.    famotidine (PEPCID) 20 MG tablet Take 1 tablet (20 mg total) by mouth 2 (two) times daily.   Multiple Vitamins-Minerals (OCUVITE ADULT 50+) CAPS    XARELTO 20 MG TABS tablet Take 20 mg by mouth at bedtime.       12/23/2022    8:24 AM 11/17/2022    1:24 PM 03/27/2022   10:36 AM 12/19/2021    8:48 AM  GAD 7 : Generalized Anxiety Score  Nervous, Anxious, on Edge 0 0 0 0  Control/stop worrying 0 0 0 0  Worry too much - different things 2 0 0 0  Trouble relaxing 0 0 0 0  Restless 0 0 0 0  Easily annoyed or irritable 1 0 0 1  Afraid - awful might happen 0 0 0 0  Total GAD 7 Score 3 0 0 1  Anxiety Difficulty Not difficult at all Not difficult at all Not  difficult at all Not difficult at all       12/23/2022    8:24 AM 12/09/2022    8:32 AM 11/17/2022    1:24 PM  Depression screen PHQ 2/9  Decreased Interest 0 0 0  Down, Depressed, Hopeless 0 0 0  PHQ - 2 Score 0 0 0  Altered sleeping 2 0 0  Tired, decreased energy 1 0 0  Change in appetite 0 0 0  Feeling bad or failure about yourself  1 0 0  Trouble concentrating 0 0 0  Moving slowly or fidgety/restless 0 0 0  Suicidal thoughts 0 0 0  PHQ-9 Score 4 0 0  Difficult doing work/chores Somewhat difficult Not difficult at all Not difficult at all    BP Readings from Last 3 Encounters:  12/23/22 112/62  12/09/22 115/71  11/17/22 128/64    Physical Exam Vitals and nursing note reviewed.  Constitutional:      General: She is not in acute distress.    Appearance: She is well-developed.  HENT:     Head: Normocephalic and atraumatic.     Right Ear: Tympanic membrane and ear canal normal.     Left Ear: Tympanic membrane and ear canal normal.     Nose:     Right Sinus: No maxillary sinus tenderness.     Left Sinus: No maxillary sinus tenderness.  Eyes:      General: No scleral icterus.       Right eye: No discharge.        Left eye: No discharge.     Conjunctiva/sclera: Conjunctivae normal.  Neck:     Thyroid: No thyromegaly.     Vascular: No carotid bruit.  Cardiovascular:     Rate and Rhythm: Normal rate. Rhythm irregular.     Pulses: Normal pulses.     Heart sounds: Normal heart sounds.  Pulmonary:     Effort: Pulmonary effort is normal. No respiratory distress.     Breath sounds: No wheezing.  Chest:  Breasts:    Right: No mass, nipple discharge, skin change or tenderness.     Left: Absent.  Abdominal:     General: Bowel sounds are normal.     Palpations: Abdomen is soft.     Tenderness: There is no abdominal tenderness.  Musculoskeletal:     Cervical back: Normal range of motion. No erythema.     Right lower leg: No edema.     Left lower leg: No edema.  Lymphadenopathy:     Cervical: No cervical adenopathy.  Skin:    General: Skin is warm and dry.     Findings: No rash.  Neurological:     Mental Status: She is alert and oriented to person, place, and time.     Cranial Nerves: No cranial nerve deficit.     Sensory: No sensory deficit.     Deep Tendon Reflexes: Reflexes are normal and symmetric.  Psychiatric:        Attention and Perception: Attention normal.        Mood and Affect: Mood normal.     Wt Readings from Last 3 Encounters:  12/23/22 166 lb 3.2 oz (75.4 kg)  12/09/22 166 lb (75.3 kg)  11/17/22 166 lb (75.3 kg)    BP 112/62   Pulse (!) 52   Ht 5\' 3"  (1.6 m)   Wt 166 lb 3.2 oz (75.4 kg)   SpO2 100%   BMI 29.44 kg/m   Assessment and Plan:  Problem  List Items Addressed This Visit     Thoracic ascending aortic aneurysm (HCC) (Chronic)    Recently seen by Cardiology CT for surveillance done last week - results pending      Paroxysmal atrial fibrillation (HCC) - Primary (Chronic)    Minimal self limited episodes without chest pain or shortness of breath but appears to be in Afib with controlled  rate today. Anticoagulated on Xarelto No bleeding issues       Relevant Orders   Comprehensive metabolic panel   Osteopenia of neck of right femur    Hx of treatment with Prolia with Dr. Merlene Pulling but discontinued Recommend continuing calcium and vitamin D daily      LPRD (laryngopharyngeal reflux disease) (Chronic)    Reflux symptoms are minimal on current therapy - changed to Pepcid last visit with good medication tolerance. No worsening of cough. No red flag signs such as weight loss, n/v, melena       Relevant Orders   CBC with Differential/Platelet   Aortic atherosclerosis (HCC) (Chronic)    Patient declines statin therapy. Lab Results  Component Value Date   LDLCALC 100 (H) 12/19/2021        Relevant Orders   Lipid panel   Other Visit Diagnoses     Elevated TSH       Relevant Orders   TSH + free T4   History of cancer of left breast       Relevant Orders   MM 3D DIAGNOSTIC MAMMOGRAM UNILATERAL RIGHT BREAST   Need for vaccination for pneumococcus       Relevant Orders   Pneumococcal conjugate vaccine 20-valent       Return in about 1 year (around 12/23/2023) for CPX.   Partially dictated using Dragon software, any errors are not intentional.  Reubin Milan, MD [facility] [place], [place]

## 2022-12-23 NOTE — Assessment & Plan Note (Addendum)
Recently seen by Cardiology CT for surveillance done last week - results pending

## 2022-12-23 NOTE — Patient Instructions (Signed)
Call [facility] to schedule your mammogram at [phone].  

## 2022-12-23 NOTE — Assessment & Plan Note (Addendum)
Reflux symptoms are minimal on current therapy - changed to Pepcid last visit with good medication tolerance. No worsening of cough. No red flag signs such as weight loss, n/v, melena

## 2022-12-23 NOTE — Assessment & Plan Note (Signed)
Hx of treatment with Prolia with Dr. Merlene Pulling but discontinued Recommend continuing calcium and vitamin D daily

## 2022-12-23 NOTE — Assessment & Plan Note (Signed)
Patient declines statin therapy. Lab Results  Component Value Date   LDLCALC 100 (H) 12/19/2021

## 2022-12-23 NOTE — Assessment & Plan Note (Addendum)
Minimal self limited episodes without chest pain or shortness of breath but appears to be in Afib with controlled rate today. Anticoagulated on Xarelto No bleeding issues

## 2022-12-25 LAB — COMPREHENSIVE METABOLIC PANEL
ALT: 17 IU/L (ref 0–32)
AST: 17 IU/L (ref 0–40)
Albumin: 4.1 g/dL (ref 3.7–4.7)
Alkaline Phosphatase: 69 IU/L (ref 44–121)
BUN/Creatinine Ratio: 31 — ABNORMAL HIGH (ref 12–28)
BUN: 23 mg/dL (ref 8–27)
Bilirubin Total: 0.5 mg/dL (ref 0.0–1.2)
CO2: 24 mmol/L (ref 20–29)
Calcium: 9.2 mg/dL (ref 8.7–10.3)
Chloride: 102 mmol/L (ref 96–106)
Creatinine, Ser: 0.75 mg/dL (ref 0.57–1.00)
Globulin, Total: 2.4 g/dL (ref 1.5–4.5)
Glucose: 83 mg/dL (ref 70–99)
Potassium: 4.2 mmol/L (ref 3.5–5.2)
Sodium: 140 mmol/L (ref 134–144)
Total Protein: 6.5 g/dL (ref 6.0–8.5)
eGFR: 77 mL/min/{1.73_m2} (ref >59)

## 2022-12-25 LAB — CBC WITH DIFFERENTIAL/PLATELET

## 2022-12-25 LAB — LIPID PANEL
Chol/HDL Ratio: 2.9 ratio (ref 0.0–4.4)
Cholesterol, Total: 172 mg/dL (ref 100–199)
HDL: 59 mg/dL (ref >39)
LDL Chol Calc (NIH): 96 mg/dL (ref 0–99)
Triglycerides: 90 mg/dL (ref 0–149)
VLDL Cholesterol Cal: 17 mg/dL (ref 5–40)

## 2022-12-25 LAB — TSH+FREE T4
Free T4: 1.27 ng/dL (ref 0.82–1.77)
TSH: 3.65 u[IU]/mL (ref 0.450–4.500)

## 2022-12-26 NOTE — Progress Notes (Signed)
MRN : [id number]  Connie Mitchell is a 89 y.o. (December 13, 1933) female who presents with chief complaint of check circulation.  History of Present Illness:   The patient returns to the office for surveillance of a known ascending thoracic aortic aneurysm. Patient denies chest pain or back pain, no other abdominal complaints. No changes suggesting embolic episodes.    The patient is also followed for popliteal artery aneurysms.  Patient denies pain behind the knee.  There has been no interval development of claudication or rest pain symptoms of the lower extremities.   There have been no interval changes in the patient's overall health care since his last visit.   Patient denies amaurosis fugax or TIA symptoms. The patient denies angina or shortness of breath.    CT angiogram of the thoracic aorta arteries (12/18/2022) shows an TAA measured 4.4 cm.  No change compared to last CT scan.  Duplex ultrasound of the popliteal arteries dated January 18, 2019 demonstrates the right popliteal artery is 0.86 cm and the left popliteal artery is 0.87 cm  No outpatient medications have been marked as taking for the 12/28/22 encounter (Appointment) with Gilda Crease, Latina Craver, MD.    Past Medical History:  Diagnosis Date   A-fib [facility])    Allergy    Anxiety    Cancer (HCC) 1991   left breast. treated with rads and lumpectomy   Cancer (HCC) 2020   newly diagnosed, also left breast   Cataract 2017   Cellulitis and abscess of toe of left foot 01/2018   Dental bridge present    permanent - bottom   Enteritis 07/14/2020   GERD (gastroesophageal reflux disease)    Heart murmur 2017   Afib 2022   Hx of therapeutic radiation 1991   left breast   Laryngopharyngeal reflux    sometimes food goes down wrong way and she ends up in extreme coughing/choking scenario   Personal history of radiation therapy 1991   left breast ca   Primary  osteoarthritis    Rectal bleed 07/14/2020   Thoracic aortic aneurysm (TAA) [facility])     Past Surgical History:  Procedure Laterality Date   BREAST BIOPSY Right 02/22/2020   stereo bx, ribbon clip, path pending    BREAST LUMPECTOMY Left 1991   breast ca, lumpectomy   cataracts Bilateral    COLONOSCOPY WITH PROPOFOL N/A 04/28/2021   Procedure: COLONOSCOPY WITH BIOPSY;  Surgeon: Midge Minium, MD;  Location: [facility];  Service: Endoscopy;  Laterality: N/A;   CYSTOCELE REPAIR  2010   EYE SURGERY Bilateral 2017   cataracts extracted   JOINT REPLACEMENT     MASTECTOMY Left 07/25/2018   Invasive Mammary Carcinoma   MASTECTOMY W/ SENTINEL NODE BIOPSY Left 07/25/2018   Procedure: MASTECTOMY WITH SENTINEL LYMPH NODE BIOPSY;  Surgeon: Henrene Dodge, MD;  Location: [facility] ORS;  Service: General;  Laterality: Left;   OOPHORECTOMY  1995   ovarian mass - benign   POLYPECTOMY N/A 04/28/2021   Procedure: POLYPECTOMY;  Surgeon: Midge Minium, MD;  Location: [facility];  Service: Endoscopy;  Laterality: N/A;  REPLACEMENT TOTAL KNEE Bilateral 2012,2013   SKIN BIOPSY Left    arm growth   TONSILLECTOMY  1958   TOTAL SHOULDER ARTHROPLASTY Right 2016    Social History Social History   Tobacco Use   Smoking status: Never   Smokeless tobacco: Never   Tobacco comments:    smoking cessation materials not required  Vaping Use   Vaping status: Never Used  Substance Use Topics   Alcohol use: Yes    Comment: occasional beer or wine   Drug use: Never    Family History Family History  Problem Relation Age of Onset   Hypertension Mother    Alzheimer's disease Mother    Hearing loss Mother    Stroke Father    Hearing loss Father    Cancer Brother        prostate, rectal cancer   Atrial fibrillation Brother    Hearing loss Brother    Atrial fibrillation Brother    Stroke Maternal Uncle    Breast cancer Cousin 72       paternal x 2   Cancer Other        breast     Allergies  Allergen Reactions   Advair Hfa [Fluticasone-Salmeterol] Other (See Comments)    Thrush, flushing and headache   Ciprofloxacin Nausea And Vomiting     REVIEW OF SYSTEMS (Negative unless checked)  Constitutional: [] Weight loss  [] Fever  [] Chills Cardiac: [] Chest pain   [] Chest pressure   [] Palpitations   [] Shortness of breath when laying flat   [] Shortness of breath with exertion. Vascular:  [x] Pain in legs with walking   [] Pain in legs at rest  [] History of DVT   [] Phlebitis   [] Swelling in legs   [] Varicose veins   [] Non-healing ulcers Pulmonary:   [] Uses home oxygen   [] Productive cough   [] Hemoptysis   [] Wheeze  [] COPD   [] Asthma Neurologic:  [] Dizziness   [] Seizures   [] History of stroke   [] History of TIA  [] Aphasia   [] Vissual changes   [] Weakness or numbness in arm   [] Weakness or numbness in leg Musculoskeletal:   [] Joint swelling   [] Joint pain   [] Low back pain Hematologic:  [] Easy bruising  [] Easy bleeding   [] Hypercoagulable state   [] Anemic Gastrointestinal:  [] Diarrhea   [] Vomiting  [] Gastroesophageal reflux/heartburn   [] Difficulty swallowing. Genitourinary:  [] Chronic kidney disease   [] Difficult urination  [] Frequent urination   [] Blood in urine Skin:  [] Rashes   [] Ulcers  Psychological:  [] History of anxiety   []  History of major depression.  Physical Examination  There were no vitals filed for this visit. There is no height or weight on file to calculate BMI. Gen: WD/WN, NAD Head: [place]/AT, No temporalis wasting.  Ear/Nose/Throat: Hearing grossly intact, nares w/o erythema or drainage Eyes: PER, EOMI, sclera nonicteric.  Neck: Supple, no masses.  No bruit or JVD.  Pulmonary:  Good air movement, no audible wheezing, no use of accessory muscles.  Cardiac: RRR, normal S1, S2, no Murmurs. Vascular:  mild trophic changes, no open wounds Vessel Right Left  Radial Palpable Palpable  Gastrointestinal: soft, non-distended. No guarding/no peritoneal signs.   Musculoskeletal: M/S 5/5 throughout.  No visible deformity.  Neurologic: CN 2-12 intact. Pain and light touch intact in extremities.  Symmetrical.  Speech is fluent. Motor exam as listed above. Psychiatric: Judgment intact, Mood & affect appropriate for pt's clinical situation. Dermatologic: No rashes or ulcers noted.  No changes consistent with cellulitis.   CBC Lab Results  Component Value Date   WBC CANCELED 12/23/2022   HGB CANCELED 12/23/2022   HCT CANCELED 12/23/2022   MCV CANCELED 12/23/2022   PLT CANCELED 12/23/2022    BMET    Component Value Date/Time   NA 140 12/23/2022 0943   K 4.2 12/23/2022 0943   CL 102 12/23/2022 0943   CO2 24 12/23/2022 0943   GLUCOSE 83 12/23/2022 0943   GLUCOSE 96 10/08/2020 1332   BUN 23 12/23/2022 0943   CREATININE 0.75 12/23/2022 0943   CALCIUM 9.2 12/23/2022 0943   GFRNONAA >60 10/08/2020 1332   GFRAA 79 06/05/2020 0941   Estimated Creatinine Clearance: 48.2 mL/min (by C-G formula based on SCr of 0.75 mg/dL).  COAG No results found for: "INR", "PROTIME"  Radiology CT CHEST WO CONTRAST  Result Date: 12/23/2022 CLINICAL DATA:  Follow-up aneurysm EXAM: CT CHEST WITHOUT CONTRAST TECHNIQUE: Multidetector CT imaging of the chest was performed following the standard protocol without IV contrast. RADIATION DOSE REDUCTION: This exam was performed according to the departmental dose-optimization program which includes automated exposure control, adjustment of the mA and/or kV according to patient size and/or use of iterative reconstruction technique. COMPARISON:  CT 12/26/2021, 01/18/2020 FINDINGS: Cardiovascular: Limited evaluation without intravenous contrast. Mild aortic atherosclerosis. Coronary vascular calcification. Normal cardiac size. No pericardial effusion. Sinus of Valsalva measures 4 cm compared with 4 cm previously. Sign of tubular junction measures 3.2 cm compared with 3.2 cm previously. Mid ascending aortic diameter measures 4.4 cm  maximum on series 2, image 76, previously 4.4 cm. Transverse arch diameter of 3 cm, previously 2.9 cm. Proximal descending thoracic aortic diameter of 2.9 cm, previously 2.9 cm. Mediastinum/Nodes: Midline trachea. No suspicious thyroid nodules. No suspicious lymph nodes. Esophagus within normal limits. Lungs/Pleura: No acute airspace disease. Emphysema. Mild scarring at the lung bases. Upper Abdomen: No acute finding. Partially visualized parapelvic cyst right kidney, no imaging follow-up is recommended. Musculoskeletal: No acute or suspicious osseous abnormality IMPRESSION: 1. Stable ascending aortic aneurysm up to 4.4 cm. Recommend annual imaging followup by CTA or MRA. This recommendation follows 2010 ACCF/AHA/AATS/ACR/ASA/SCA/SCAI/SIR/STS/SVM Guidelines for the Diagnosis and Management of Patients with Thoracic Aortic Disease. Circulation. 2010; 121: Z610-R604. Aortic aneurysm NOS (ICD10-I71.9) 2. Emphysema Aortic aneurysm NOS (ICD10-I71.9).Aortic Atherosclerosis (ICD10-I70.0) and Emphysema (ICD10-J43.9). Electronically Signed   By: Jasmine Pang M.D.   On: 12/23/2022 21:10     Assessment/Plan 1. 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HCC) Recommend:  I do not find evidence of life style limiting vascular disease. The patient specifically denies life style limitation.  Previous duplex ultrasound of the popliteal arteries demonstrates borderline popliteal artery enlargement.  This can be followed every 3 to 4 years to ensure that the popliteal arteries do not continue to increase in size.  The patient should continue walking and begin a more formal exercise program. The patient should continue his antiplatelet therapy and aggressive treatment of the lipid abnormalities.  The patient is instructed to call the office if there is a significant change in the lower extremity symptoms, particularly if a wound develops or there is an abrupt increase in leg pain.   2. Aortic atherosclerosis (HCC) Recommend:  No surgery or  intervention is indicated at this time.  The patient has an asymptomatic thoracic aortic aneurysm that is less than 6.0 cm in maximal diameter.  I have discussed the natural history of thoracic aortic aneurysm and the small risk of rupture for aneurysm less than 6.5 cm in size.  However, as these small aneurysms tend to enlarge over time, continued surveillance  with CT scan is mandatory.   I have also discussed optimizing medical management with hypertension and lipid control and the importance of abstinence from tobacco.  The patient is also encouraged to exercise a minimum of 30 minutes 4 times a week.   Should the patient develop new onset chest or back pain or signs of peripheral embolization they are instructed to seek medical attention immediately and to alert the physician providing care that they have an aneurysm in the chest.   The patient voices their understanding.  The patient will return as ordered with a CT scan of the chest - CT ANGIO CHEST AORTA W/CM & OR WO/CM; Future  3. Aneurysm of ascending aorta without rupture (HCC) Recommend:  No surgery or intervention is indicated at this time.  The patient has an asymptomatic thoracic aortic aneurysm that is less than 6.0 cm in maximal diameter.  I have discussed the natural history of thoracic aortic aneurysm and the small risk of rupture for aneurysm less than 6.5 cm in size.  However, as these small aneurysms tend to enlarge over time, continued surveillance with CT scan is mandatory.   I have also discussed optimizing medical management with hypertension and lipid control and the importance of abstinence from tobacco.  The patient is also encouraged to exercise a minimum of 30 minutes 4 times a week.   Should the patient develop new onset chest or back pain or signs of peripheral embolization they are instructed to seek medical attention immediately and to alert the physician providing care that they have an aneurysm in the chest.    The patient voices their understanding.  The patient will return as ordered with a CT scan of the chest - CT ANGIO CHEST AORTA W/CM & OR WO/CM; Future  4. Paroxysmal atrial fibrillation (HCC) Continue antiarrhythmia medications as already ordered, these medications have been reviewed and there are no changes at this time.  Continue anticoagulation as ordered by Cardiology Service  5. Primary osteoarthritis involving multiple joints Continue NSAID medications as already ordered, these medications have been reviewed and there are no changes at this time.  Continued activity and therapy was stressed.    Levora Dredge, MD  12/26/2022 3:33 PM

## 2022-12-28 ENCOUNTER — Ambulatory Visit (INDEPENDENT_AMBULATORY_CARE_PROVIDER_SITE_OTHER): Payer: Medicare Other | Admitting: Vascular Surgery

## 2022-12-28 ENCOUNTER — Encounter (INDEPENDENT_AMBULATORY_CARE_PROVIDER_SITE_OTHER): Payer: Self-pay

## 2022-12-28 ENCOUNTER — Encounter (INDEPENDENT_AMBULATORY_CARE_PROVIDER_SITE_OTHER): Payer: Medicare Other

## 2022-12-28 ENCOUNTER — Other Ambulatory Visit: Payer: Self-pay

## 2022-12-28 ENCOUNTER — Encounter (INDEPENDENT_AMBULATORY_CARE_PROVIDER_SITE_OTHER): Payer: Self-pay | Admitting: Vascular Surgery

## 2022-12-28 VITALS — BP 152/82 | HR 60 | Resp 18 | Ht 63.0 in | Wt 166.0 lb

## 2022-12-28 DIAGNOSIS — I700 Atherosclerosis of aorta: Secondary | ICD-10-CM | POA: Diagnosis not present

## 2022-12-28 DIAGNOSIS — I7121 Aneurysm of the ascending aorta, without rupture: Secondary | ICD-10-CM

## 2022-12-28 DIAGNOSIS — M159 Polyosteoarthritis, unspecified: Secondary | ICD-10-CM

## 2022-12-28 DIAGNOSIS — I724 Aneurysm of artery of lower extremity: Secondary | ICD-10-CM | POA: Diagnosis not present

## 2022-12-28 DIAGNOSIS — K219 Gastro-esophageal reflux disease without esophagitis: Secondary | ICD-10-CM

## 2022-12-28 DIAGNOSIS — I480 Paroxysmal atrial fibrillation: Secondary | ICD-10-CM

## 2022-12-28 NOTE — Progress Notes (Signed)
 Called pt left VM to call back.  PEC may give results if patient returns call - CRM created.  KP

## 2022-12-28 NOTE — Progress Notes (Signed)
Pt aware that new labs are ordered.  KP

## 2022-12-28 NOTE — Progress Notes (Signed)
Please review.  KP

## 2022-12-29 DIAGNOSIS — K219 Gastro-esophageal reflux disease without esophagitis: Secondary | ICD-10-CM | POA: Diagnosis not present

## 2023-01-01 LAB — CBC WITH DIFFERENTIAL/PLATELET
Basophils Absolute: 0.1 10*3/uL (ref 0.0–0.2)
Basos: 1 %
EOS (ABSOLUTE): 0.3 10*3/uL (ref 0.0–0.4)
Eos: 5 %
Hematocrit: 42.9 % (ref 34.0–46.6)
Hemoglobin: 14.1 g/dL (ref 11.1–15.9)
Immature Grans (Abs): 0 10*3/uL (ref 0.0–0.1)
Immature Granulocytes: 0 %
Lymphocytes Absolute: 1.8 10*3/uL (ref 0.7–3.1)
Lymphs: 25 %
MCH: 31.1 pg (ref 26.6–33.0)
MCHC: 32.9 g/dL (ref 31.5–35.7)
MCV: 95 fL (ref 79–97)
Monocytes Absolute: 0.6 10*3/uL (ref 0.1–0.9)
Monocytes: 9 %
Neutrophils Absolute: 4.4 10*3/uL (ref 1.4–7.0)
Neutrophils: 60 %
RBC: 4.53 x10E6/uL (ref 3.77–5.28)
RDW: 13.8 % (ref 11.7–15.4)
WBC: 7.1 10*3/uL (ref 3.4–10.8)

## 2023-01-04 ENCOUNTER — Other Ambulatory Visit: Payer: Self-pay

## 2023-01-04 DIAGNOSIS — K219 Gastro-esophageal reflux disease without esophagitis: Secondary | ICD-10-CM

## 2023-01-05 ENCOUNTER — Encounter (INDEPENDENT_AMBULATORY_CARE_PROVIDER_SITE_OTHER): Payer: Self-pay | Admitting: Vascular Surgery

## 2023-01-08 DIAGNOSIS — K219 Gastro-esophageal reflux disease without esophagitis: Secondary | ICD-10-CM | POA: Diagnosis not present

## 2023-01-11 NOTE — Progress Notes (Signed)
Pt would like to try [facility] to get new lab drawn.

## 2023-01-25 ENCOUNTER — Telehealth: Payer: Medicare Other | Admitting: Family Medicine

## 2023-01-25 ENCOUNTER — Encounter: Payer: Self-pay | Admitting: Family Medicine

## 2023-01-25 ENCOUNTER — Ambulatory Visit: Payer: Self-pay

## 2023-01-25 VITALS — Temp 100.1°F

## 2023-01-25 DIAGNOSIS — U071 COVID-19: Secondary | ICD-10-CM

## 2023-01-25 MED ORDER — MOLNUPIRAVIR EUA 200MG CAPSULE: 4.0000 | ORAL_CAPSULE | Freq: Two times a day (BID) | ORAL | 0 refills | Status: AC

## 2023-01-25 NOTE — Telephone Encounter (Signed)
    Chief Complaint: COVID positive today. Cough, headache, mild SOB. Symptoms: Above Frequency: Several days ago Pertinent Negatives: Patient denies fever Disposition: [] ED /[] Urgent Care (no appt availability in office) / [x] Appointment(In office/virtual)/ []  [facility]/ [] Home Care/ [] Refused Recommended Disposition /[] [facility]/ []  Follow-up with PCP Additional Notes: Pt. Agrees with appointment.  Reason for Disposition  MILD difficulty breathing (e.g., minimal/no SOB at rest, SOB with walking, pulse <100)  Answer Assessment - Initial Assessment Questions 1. COVID-19 DIAGNOSIS: "How do you know that you have COVID?" (e.g., positive lab test or self-test, diagnosed by doctor or NP/PA, symptoms after exposure).     Home test 2. COVID-19 EXPOSURE: "Was there any known exposure to COVID before the symptoms began?" CDC Definition of close contact: within 6 feet (2 meters) for a total of 15 minutes or more over a 24-hour period.      No 3. ONSET: "When did the COVID-19 symptoms start?"      Several days ago 4. WORST SYMPTOM: "What is your worst symptom?" (e.g., cough, fever, shortness of breath, muscle aches)     Cough 5. COUGH: "Do you have a cough?" If Yes, ask: "How bad is the cough?"       Yes 6. FEVER: "Do you have a fever?" If Yes, ask: "What is your temperature, how was it measured, and when did it start?"     No 7. RESPIRATORY STATUS: "Describe your breathing?" (e.g., normal; shortness of breath, wheezing, unable to speak)      SOB - when coughing 8. BETTER-SAME-WORSE: "Are you getting better, staying the same or getting worse compared to yesterday?"  If getting worse, ask, "In what way?"     Worse 9. OTHER SYMPTOMS: "Do you have any other symptoms?"  (e.g., chills, fatigue, headache, loss of smell or taste, muscle pain, sore throat)     Headache 10. HIGH RISK DISEASE: "Do you have any chronic medical problems?" (e.g., asthma, heart or lung disease, weak  immune system, obesity, etc.)       Yes 11. VACCINE: "Have you had the COVID-19 vaccine?" If Yes, ask: "Which one, how many shots, when did you get it?"       N/a 12. PREGNANCY: "Is there any chance you are pregnant?" "When was your last menstrual period?"       No 13. O2 SATURATION MONITOR:  "Do you use an oxygen saturation monitor (pulse oximeter) at home?" If Yes, ask "What is your reading (oxygen level) today?" "What is your usual oxygen saturation reading?" (e.g., 95%)       No  Protocols used: Coronavirus (COVID-19) Diagnosed or Suspected-A-AH

## 2023-01-26 DIAGNOSIS — U071 COVID-19: Secondary | ICD-10-CM | POA: Insufficient documentation

## 2023-01-26 NOTE — Assessment & Plan Note (Signed)
URI symptoms since Saturday, housemate with COVID. She tested positive this AM, has been at her very active baseline (2 miles on stationary bike, lawn-mowing, etc) until today. We reviewed treatment strategies.  Plan: - Dose molnupiravir for full course - Extensive review of OTC supportive care options and isolation guidelines - We discussed red flag symptoms and indication to present to office versus emergency room, vocalized understanding

## 2023-01-26 NOTE — Progress Notes (Signed)
Primary Care / Sports Medicine Virtual Visit  Patient Information:  Patient ID: Connie Mitchell, female DOB: [birth year] Age: 89 y.o. MRN: [id number]   Connie Mitchell is a pleasant 89 y.o. female presenting with the following:  Chief Complaint  Patient presents with   Covid Positive    Started with Sx on Saturday, tested positive this am- cough, sore throat, headache, facial pain, 100.1 temp today, shortness of breath    Review of Systems: No fevers, chills, night sweats, weight loss, chest pain, or shortness of breath.   Patient Active Problem List   Diagnosis Date Noted   COVID 01/26/2023   Arthralgia of right temporomandibular joint 03/27/2022   Acquired thrombophilia (HCC) 12/19/2021   Polyp of ascending colon 10/01/2021   Chronic cough 08/13/2021   Paroxysmal atrial fibrillation (HCC) 07/14/2020   Aortic atherosclerosis (HCC) 12/12/2019   Axillary adenopathy 01/01/2019   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HCC) 12/30/2018   Osteopenia of neck of right femur 07/12/2018   Thoracic ascending aortic aneurysm (HCC) 05/27/2018   LPRD (laryngopharyngeal reflux disease) 03/17/2018   Chronic foot pain 11/25/2017   Lumbar degenerative disc disease 09/24/2017   Hx of Lyme disease 09/24/2017   Hx of basal cell carcinoma 09/24/2017   Nuclear sclerotic cataract of left eye 08/09/2015   Osteoporosis 07/04/2014   Primary localized osteoarthrosis of shoulder region 12/13/2012   Osteoarthritis 12/18/2011   Past Medical History:  Diagnosis Date   A-fib [facility])    Allergy    Anxiety    Cancer (HCC) 1991   left breast. treated with rads and lumpectomy   Cancer (HCC) 2020   newly diagnosed, also left breast   Cataract 2017   Cellulitis and abscess of toe of left foot 01/2018   Dental bridge present    permanent - bottom   Enteritis 07/14/2020   GERD (gastroesophageal reflux disease)    Heart murmur 2017   Afib 2022   Hx of therapeutic radiation 1991   left breast   Laryngopharyngeal reflux     sometimes food goes down wrong way and she ends up in extreme coughing/choking scenario   Personal history of radiation therapy 1991   left breast ca   Primary osteoarthritis    Rectal bleed 07/14/2020   Thoracic aortic aneurysm (TAA) [facility])    Outpatient Encounter Medications as of 01/25/2023  Medication Sig   Calcium Carbonate-Vitamin D (CALCIUM-VITAMIN D) 600-125 MG-UNIT TABS Take 1 tablet by mouth 2 (two) times a day.    famotidine (PEPCID) 20 MG tablet Take 1 tablet (20 mg total) by mouth 2 (two) times daily.   molnupiravir EUA (LAGEVRIO) 200 mg CAPS capsule Take 4 capsules (800 mg total) by mouth 2 (two) times daily for 5 days.   Multiple Vitamins-Minerals (OCUVITE ADULT 50+) CAPS    XARELTO 20 MG TABS tablet Take 20 mg by mouth at bedtime.   No facility-administered encounter medications on file as of 01/25/2023.   Past Surgical History:  Procedure Laterality Date   BREAST BIOPSY Right 02/22/2020   stereo bx, ribbon clip, path pending    BREAST LUMPECTOMY Left 1991   breast ca, lumpectomy   cataracts Bilateral    COLONOSCOPY WITH PROPOFOL N/A 04/28/2021   Procedure: COLONOSCOPY WITH BIOPSY;  Surgeon: Midge Minium, MD;  Location: [facility];  Service: Endoscopy;  Laterality: N/A;   CYSTOCELE REPAIR  2010   EYE SURGERY Bilateral 2017   cataracts extracted   JOINT REPLACEMENT     MASTECTOMY Left 07/25/2018  Invasive Mammary Carcinoma   MASTECTOMY W/ SENTINEL NODE BIOPSY Left 07/25/2018   Procedure: MASTECTOMY WITH SENTINEL LYMPH NODE BIOPSY;  Surgeon: Henrene Dodge, MD;  Location: [facility] ORS;  Service: General;  Laterality: Left;   OOPHORECTOMY  1995   ovarian mass - benign   POLYPECTOMY N/A 04/28/2021   Procedure: POLYPECTOMY;  Surgeon: Midge Minium, MD;  Location: [facility];  Service: Endoscopy;  Laterality: N/A;   REPLACEMENT TOTAL KNEE Bilateral 2012,2013   SKIN BIOPSY Left    arm growth   TONSILLECTOMY  1958   TOTAL SHOULDER ARTHROPLASTY Right 2016     Virtual Visit via MyChart Video:   I connected with [facility] on 01/26/23 via MyChart Video and verified that I am speaking with the correct person using appropriate identifiers.   The limitations, risks, security and privacy concerns of performing an evaluation and management service by MyChart Video, including the higher likelihood of inaccurate diagnoses and treatments, and the availability of in person appointments were reviewed. The possible need of an additional face-to-face encounter for complete and high quality delivery of care was discussed. The patient was also made aware that there may be a patient responsible charge related to this service. The patient expressed understanding and wishes to proceed.  Provider location is in medical facility. Patient location is at their home, different from provider location. People involved in care of the patient during this telehealth encounter were myself, my nurse/medical assistant, and my front office/scheduling team member.  Objective findings:   General: Speaking full sentences, no audible heavy breathing. Sounds alert and appropriately interactive. Well-appearing. Face symmetric. Extraocular movements intact. Pupils equal and round. No nasal flaring or accessory muscle use visualized.  Independent interpretation of notes and tests performed by another provider:   None  Pertinent History, Exam, Impression, and Recommendations:   COVID URI symptoms since Saturday, housemate with COVID. She tested positive this AM, has been at her very active baseline (2 miles on stationary bike, lawn-mowing, etc) until today. We reviewed treatment strategies.  Plan: - Dose molnupiravir for full course - Extensive review of OTC supportive care options and isolation guidelines - We discussed red flag symptoms and indication to present to office versus emergency room, vocalized understanding   Orders & Medications Meds ordered this encounter   Medications   molnupiravir EUA (LAGEVRIO) 200 mg CAPS capsule    Sig: Take 4 capsules (800 mg total) by mouth 2 (two) times daily for 5 days.    Dispense:  40 capsule    Refill:  0   No orders of the defined types were placed in this encounter.    I discussed the above assessment and treatment plan with the patient. The patient was provided an opportunity to ask questions and all were answered. The patient agreed with the plan and demonstrated an understanding of the instructions.   The patient was advised to call back or seek an in-person evaluation if the symptoms worsen or if the condition fails to improve as anticipated.   I provided a total time of 30 minutes including both face-to-face and non-face-to-face time on 01/26/2023 inclusive of time utilized for medical chart review, information gathering, care coordination with staff, and documentation completion.    Jerrol Banana, MD, [facility]   Primary Care Sports Medicine Primary Care and Sports Medicine at [facility]

## 2023-02-01 ENCOUNTER — Encounter: Payer: Self-pay | Admitting: Family Medicine

## 2023-02-01 NOTE — Telephone Encounter (Signed)
Please review.  KP

## 2023-02-10 ENCOUNTER — Ambulatory Visit
Admission: RE | Admit: 2023-02-10 | Discharge: 2023-02-10 | Disposition: A | Discharge status: Home / Self Care | Payer: Medicare Other | Source: Ambulatory Visit | Attending: Internal Medicine | Admitting: Internal Medicine

## 2023-02-10 DIAGNOSIS — Z853 Personal history of malignant neoplasm of breast: Secondary | ICD-10-CM | POA: Diagnosis present

## 2023-02-10 DIAGNOSIS — Z1231 Encounter for screening mammogram for malignant neoplasm of breast: Secondary | ICD-10-CM | POA: Diagnosis not present

## 2023-02-11 ENCOUNTER — Other Ambulatory Visit: Payer: Self-pay | Admitting: Internal Medicine

## 2023-02-11 DIAGNOSIS — K219 Gastro-esophageal reflux disease without esophagitis: Secondary | ICD-10-CM

## 2023-02-12 ENCOUNTER — Ambulatory Visit: Payer: Medicare Other | Admitting: Surgery

## 2023-02-12 ENCOUNTER — Encounter: Payer: Self-pay | Admitting: Surgery

## 2023-02-12 VITALS — BP 135/80 | HR 60 | Temp 98.5°F | Ht 63.0 in | Wt 165.2 lb

## 2023-02-12 DIAGNOSIS — C50412 Malignant neoplasm of upper-outer quadrant of left female breast: Secondary | ICD-10-CM

## 2023-02-12 DIAGNOSIS — Z08 Encounter for follow-up examination after completed treatment for malignant neoplasm: Secondary | ICD-10-CM | POA: Diagnosis not present

## 2023-02-12 DIAGNOSIS — Z170 Estrogen receptor positive status [ER+]: Secondary | ICD-10-CM

## 2023-02-12 NOTE — Telephone Encounter (Signed)
Requested Prescriptions  Pending Prescriptions Disp Refills   famotidine (PEPCID) 20 MG tablet [Pharmacy Med Name: FAMOTIDINE 20 MG TABLET] 180 tablet 0    Sig: TAKE 1 TABLET BY MOUTH TWICE A DAY     Gastroenterology:  H2 Antagonists Passed - 02/11/2023  2:31 PM      Passed - Valid encounter within last 12 months    Recent Outpatient Visits           2 weeks ago COVID   [facility] at [facility], Ocie Bob, MD   1 month ago Paroxysmal atrial fibrillation [facility])   [place] [facility] at [facility], Nyoka Cowden, MD   2 months ago Arthralgia of right temporomandibular joint   [place] [facility] at [facility], Nyoka Cowden, MD   10 months ago Arthralgia of right temporomandibular joint   [place] [facility] at [facility], Nyoka Cowden, MD   1 year ago Paroxysmal atrial fibrillation [facility])   [place] [facility] at [facility], Nyoka Cowden, MD       Future Appointments             In 10 months Judithann Graves Nyoka Cowden, MD [facility] at [facility], [facility]

## 2023-02-12 NOTE — Patient Instructions (Signed)
You may have your follow up mammogram in one year. This may be ordered through your primary care provider. You may choose to not have any further mammograms done but you should discuss this with your PCP.    Follow-up with our office as needed.  Please call and ask to speak with a nurse if you develop questions or concerns.    Breast Self-Awareness Breast self-awareness is knowing how your breasts look and feel. You need to: Check your breasts on a regular basis. Tell your doctor about any changes. Become familiar with the look and feel of your breasts. This can help you catch a breast problem while it is still small and can be treated. You should do breast self-exams even if you have breast implants. What you need: A mirror. A well-lit room. A pillow or other soft object. How to do a breast self-exam Follow these steps to do a breast self-exam: Look for changes  Take off all the clothes above your waist. Stand in front of a mirror in a room with good lighting. Put your hands down at your sides. Compare your breasts in the mirror. Look for any difference between them, such as: A difference in shape. A difference in size. Wrinkles, dips, and bumps in one breast and not the other. Look at each breast for changes in the skin, such as: Redness. Scaly areas. Skin that has gotten thicker. Dimpling. Open sores (ulcers). Look for changes in your nipples, such as: Fluid coming out of a nipple. Fluid around a nipple. Bleeding. Dimpling. Redness. A nipple that looks pushed in (retracted), or that has changed position. Feel for changes Lie on your back. Feel each breast. To do this: Pick a breast to feel. Place a pillow under the shoulder closest to that breast. Put the arm closest to that breast behind your head. Feel the nipple area of that breast using the hand of your other arm. Feel the area with the pads of your three middle fingers by making small circles with your fingers.  Use light, medium, and firm pressure. Continue the overlapping circles, moving downward over the breast. Keep making circles with your fingers. Stop when you feel your ribs. Start making circles with your fingers again, this time going upward until you reach your collarbone. Then, make circles outward across your breast and into your armpit area. Squeeze your nipple. Check for discharge and lumps. Repeat these steps to check your other breast. Sit or stand in the tub or shower. With soapy water on your skin, feel each breast the same way you did when you were lying down. Write down what you find Writing down what you find can help you remember what to tell your doctor. Write down: What is normal for each breast. Any changes you find in each breast. These include: The kind of changes you find. A tender or painful breast. Any lump you find. Write down its size and where it is. When you last had your monthly period (menstrual cycle). General tips If you are breastfeeding, the best time to check your breasts is after you feed your baby or after you use a breast pump. If you get monthly bleeding, the best time to check your breasts is 5-7 days after your monthly cycle ends. With time, you will become comfortable with the self-exam. You will also start to know if there are changes in your breasts. Contact a doctor if: You see a change in the shape or size of your breasts  or nipples. You see a change in the skin of your breast or nipples, such as red or scaly skin. You have fluid coming from your nipples that is not normal. You find a new lump or thick area. You have breast pain. You have any concerns about your breast health. Summary Breast self-awareness includes looking for changes in your breasts and feeling for changes within your breasts. You should do breast self-awareness in front of a mirror in a well-lit room. If you get monthly periods (menstrual cycles), the best time to check your  breasts is 5-7 days after your period ends. Tell your doctor about any changes you see in your breasts. Changes include changes in size, changes on the skin, painful or tender breasts, or fluid from your nipples that is not normal. This information is not intended to replace advice given to you by your health care provider. Make sure you discuss any questions you have with your health care provider. Document Revised: 11/06/2021 Document Reviewed: 04/03/2021 Elsevier Patient Education  2024 [employer].

## 2023-02-12 NOTE — Progress Notes (Signed)
02/12/2023  History of Present Illness: Connie Mitchell is a 89 y.o. female status post left breast mastectomy with sentinel lymph node biopsy for recurrent left breast cancer in February 2020.  Presents today for yearly follow-up.  Patient had a right breast screening mammogram done on 02/10/23 which did not show any suspicious findings.  I personally viewed the images.  Denies any issues with the left mastectomy site or the right breast.  She reports that she's moving to [place] at the end of next month.  Past Medical History: Past Medical History:  Diagnosis Date   A-fib [facility])    Allergy    Anxiety    Cancer (HCC) 1991   left breast. treated with rads and lumpectomy   Cancer (HCC) 2020   newly diagnosed, also left breast   Cataract 2017   Cellulitis and abscess of toe of left foot 01/2018   Dental bridge present    permanent - bottom   Enteritis 07/14/2020   GERD (gastroesophageal reflux disease)    Heart murmur 2017   Afib 2022   Hx of therapeutic radiation 1991   left breast   Laryngopharyngeal reflux    sometimes food goes down wrong way and she ends up in extreme coughing/choking scenario   Personal history of radiation therapy 1991   left breast ca   Primary osteoarthritis    Rectal bleed 07/14/2020   Thoracic aortic aneurysm (TAA) [facility])      Past Surgical History: Past Surgical History:  Procedure Laterality Date   BREAST BIOPSY Right 02/22/2020   stereo bx, ribbon clip, path pending    BREAST LUMPECTOMY Left 1991   breast ca, lumpectomy   cataracts Bilateral    COLONOSCOPY WITH PROPOFOL N/A 04/28/2021   Procedure: COLONOSCOPY WITH BIOPSY;  Surgeon: Midge Minium, MD;  Location: [facility];  Service: Endoscopy;  Laterality: N/A;   CYSTOCELE REPAIR  2010   EYE SURGERY Bilateral 2017   cataracts extracted   JOINT REPLACEMENT     MASTECTOMY Left 07/25/2018   Invasive Mammary Carcinoma   MASTECTOMY W/ SENTINEL NODE BIOPSY Left 07/25/2018    Procedure: MASTECTOMY WITH SENTINEL LYMPH NODE BIOPSY;  Surgeon: Henrene Dodge, MD;  Location: [facility];  Service: General;  Laterality: Left;   OOPHORECTOMY  1995   ovarian mass - benign   POLYPECTOMY N/A 04/28/2021   Procedure: POLYPECTOMY;  Surgeon: Midge Minium, MD;  Location: [facility];  Service: Endoscopy;  Laterality: N/A;   REPLACEMENT TOTAL KNEE Bilateral 2012,2013   SKIN BIOPSY Left    arm growth   TONSILLECTOMY  1958   TOTAL SHOULDER ARTHROPLASTY Right 2016    Home Medications: Prior to Admission medications   Medication Sig Start Date End Date Taking? Authorizing Provider  Calcium Carbonate-Vitamin D (CALCIUM-VITAMIN D) 600-125 MG-UNIT TABS Take 1 tablet by mouth 2 (two) times a day.    Yes [provider]  Multiple Vitamins-Minerals (OCUVITE ADULT 50+) CAPS    Yes [provider]  omeprazole (PRILOSEC) 40 MG capsule TAKE 1 CAPSULE BY MOUTH EVERY DAY 12/24/21  Yes Reubin Milan, MD  XARELTO 20 MG TABS tablet Take 20 mg by mouth at bedtime. 06/17/20  Yes [provider]    Allergies: Allergies  Allergen Reactions   Advair Hfa [Fluticasone-Salmeterol] Other (See Comments)    Thrush, flushing and headache   Ciprofloxacin Nausea And Vomiting    Review of Systems: Review of Systems  Constitutional:  Negative for chills and fever.  Respiratory:  Negative for shortness of breath.   Cardiovascular:  Negative for chest pain.  Gastrointestinal:  Negative for abdominal pain, nausea and vomiting.  Skin:  Negative for rash.    Physical Exam BP 135/80   Pulse 60   Temp 98.5 F (36.9 C)   Ht 5\' 3"  (1.6 m)   Wt 165 lb 3.2 oz (74.9 kg)   SpO2 97%   BMI 29.26 kg/m  CONSTITUTIONAL: No acute distress HEENT:  Normocephalic, atraumatic, extraocular motion intact. RESPIRATORY:  Normal respiratory effort without pathologic use of accessory muscles. CARDIOVASCULAR: Regular rhythm and rate BREAST: Left breast status postmastectomy with scar  well-healed.  There is a stable nodule that is at the midportion of the scar measuring about 1 cm in size which is soft, nontender.  No left axillary lymphadenopathy.  Right breast without any palpable masses, skin changes, or nipple changes.  No right axillary lymphadenopathy. NEUROLOGIC:  Motor and sensation is grossly normal.  Cranial nerves are grossly intact. PSYCH:  Alert and oriented to person, place and time. Affect is normal.  Labs/Imaging: Mammogram on 02/10/23: FINDINGS: The patient has had a left mastectomy. There are no findings suspicious for malignancy.   IMPRESSION: No mammographic evidence of malignancy. A result letter of this screening mammogram will be mailed directly to the patient.   RECOMMENDATION: Screening mammogram in one year.  (Code:SM-R-37M)   BI-RADS CATEGORY  1: Negative.  Assessment and Plan: This is a 89 y.o. female status post left breast mastectomy and sentinel lymph node biopsy.  - Patient is doing well any complaints or new findings.  Right breast mammogram does not show any suspicious findings and physical exam today is very reassuring. - Patient is moving to [place].  She is about 4.5 years out from her surgery and would not necessarily need further surgical follow up.  Discussed with her that once she gets established with a PCP, should have a discussion about continuing with screening mammograms vs watchful waiting given her age.  Patient reports that she likely would not want to pursue any treatment if she were to get new cancer. -- Follow up as needed.  I spent 20 minutes dedicated to the care of this patient on the date of this encounter to include pre-visit review of records, face-to-face time with the patient discussing diagnosis and management, and any post-visit coordination of care.   Howie Ill, MD [facility]

## 2023-03-04 DIAGNOSIS — H9202 Otalgia, left ear: Secondary | ICD-10-CM | POA: Diagnosis not present

## 2023-03-04 DIAGNOSIS — T162XXA Foreign body in left ear, initial encounter: Secondary | ICD-10-CM | POA: Diagnosis not present

## 2023-11-02 ENCOUNTER — Encounter (INDEPENDENT_AMBULATORY_CARE_PROVIDER_SITE_OTHER): Payer: Self-pay

## 2023-11-19 ENCOUNTER — Telehealth (INDEPENDENT_AMBULATORY_CARE_PROVIDER_SITE_OTHER): Payer: Self-pay | Admitting: Vascular Surgery

## 2023-11-19 NOTE — Telephone Encounter (Signed)
 Spoke with patient to advise of the time to schedule CT scan. Patient states that she is [place] longer [place]. I advised that I would cancel the order and to call if she needs anything.

## 2023-12-27 ENCOUNTER — Encounter: Payer: Self-pay | Admitting: Internal Medicine
